# Patient Record
Sex: Female | Born: 1935 | Race: White | Hispanic: No | Smoking: Former smoker
Health system: Southern US, Community
[De-identification: ages and names within clinical notes are randomized; demographics above are authoritative.]

## PROBLEM LIST (undated history)

## (undated) DIAGNOSIS — D649 Anemia, unspecified: Secondary | ICD-10-CM

## (undated) DIAGNOSIS — C801 Malignant (primary) neoplasm, unspecified: Secondary | ICD-10-CM

## (undated) DIAGNOSIS — E785 Hyperlipidemia, unspecified: Secondary | ICD-10-CM

## (undated) DIAGNOSIS — N184 Chronic kidney disease, stage 4 (severe): Secondary | ICD-10-CM

## (undated) DIAGNOSIS — I255 Ischemic cardiomyopathy: Secondary | ICD-10-CM

## (undated) DIAGNOSIS — K1379 Other lesions of oral mucosa: Secondary | ICD-10-CM

## (undated) DIAGNOSIS — F329 Major depressive disorder, single episode, unspecified: Secondary | ICD-10-CM

## (undated) DIAGNOSIS — F32A Depression, unspecified: Secondary | ICD-10-CM

## (undated) DIAGNOSIS — I729 Aneurysm of unspecified site: Secondary | ICD-10-CM

## (undated) DIAGNOSIS — F419 Anxiety disorder, unspecified: Secondary | ICD-10-CM

## (undated) DIAGNOSIS — I5042 Chronic combined systolic (congestive) and diastolic (congestive) heart failure: Secondary | ICD-10-CM

## (undated) DIAGNOSIS — E119 Type 2 diabetes mellitus without complications: Secondary | ICD-10-CM

## (undated) DIAGNOSIS — G629 Polyneuropathy, unspecified: Secondary | ICD-10-CM

## (undated) DIAGNOSIS — K219 Gastro-esophageal reflux disease without esophagitis: Secondary | ICD-10-CM

## (undated) DIAGNOSIS — T82118A Breakdown (mechanical) of other cardiac electronic device, initial encounter: Secondary | ICD-10-CM

## (undated) DIAGNOSIS — M7542 Impingement syndrome of left shoulder: Secondary | ICD-10-CM

## (undated) DIAGNOSIS — I2510 Atherosclerotic heart disease of native coronary artery without angina pectoris: Secondary | ICD-10-CM

## (undated) HISTORY — DX: Hyperlipidemia, unspecified: E78.5

## (undated) HISTORY — PX: EYE SURGERY: SHX253

## (undated) HISTORY — DX: Anxiety disorder, unspecified: F41.9

## (undated) HISTORY — DX: Type 2 diabetes mellitus without complications: E11.9

## (undated) HISTORY — DX: Atherosclerotic heart disease of native coronary artery without angina pectoris: I25.10

## (undated) HISTORY — DX: Ischemic cardiomyopathy: I25.5

## (undated) HISTORY — PX: CHOLECYSTECTOMY: SHX55

## (undated) HISTORY — DX: Impingement syndrome of left shoulder: M75.42

## (undated) HISTORY — DX: Polyneuropathy, unspecified: G62.9

## (undated) HISTORY — PX: ABDOMINAL HYSTERECTOMY: SHX81

## (undated) HISTORY — DX: Gastro-esophageal reflux disease without esophagitis: K21.9

## (undated) HISTORY — DX: Aneurysm of unspecified site: I72.9

---

## 2003-12-12 ENCOUNTER — Ambulatory Visit (HOSPITAL_COMMUNITY): Admission: RE | Admit: 2003-12-12 | Discharge: 2003-12-12 | Payer: Self-pay | Admitting: Internal Medicine

## 2004-06-29 ENCOUNTER — Ambulatory Visit: Payer: Self-pay | Admitting: Cardiology

## 2004-08-05 ENCOUNTER — Ambulatory Visit: Payer: Self-pay | Admitting: Cardiology

## 2004-08-09 ENCOUNTER — Ambulatory Visit: Payer: Self-pay | Admitting: Cardiology

## 2004-08-09 ENCOUNTER — Inpatient Hospital Stay (HOSPITAL_COMMUNITY): Admission: RE | Admit: 2004-08-09 | Discharge: 2004-08-10 | Payer: Self-pay | Admitting: Cardiology

## 2004-08-09 ENCOUNTER — Inpatient Hospital Stay (HOSPITAL_BASED_OUTPATIENT_CLINIC_OR_DEPARTMENT_OTHER): Admission: RE | Admit: 2004-08-09 | Discharge: 2004-08-09 | Payer: Self-pay | Admitting: Cardiology

## 2004-08-12 ENCOUNTER — Ambulatory Visit: Payer: Self-pay | Admitting: Cardiology

## 2004-08-19 ENCOUNTER — Ambulatory Visit: Payer: Self-pay | Admitting: Cardiology

## 2004-08-25 ENCOUNTER — Ambulatory Visit: Payer: Self-pay | Admitting: Cardiology

## 2004-09-02 ENCOUNTER — Ambulatory Visit: Payer: Self-pay | Admitting: Cardiology

## 2004-09-06 ENCOUNTER — Ambulatory Visit: Payer: Self-pay | Admitting: Cardiology

## 2004-09-08 ENCOUNTER — Ambulatory Visit: Payer: Self-pay | Admitting: Cardiology

## 2004-09-10 ENCOUNTER — Ambulatory Visit: Payer: Self-pay | Admitting: Cardiology

## 2004-09-16 ENCOUNTER — Ambulatory Visit: Payer: Self-pay | Admitting: Cardiology

## 2004-09-20 ENCOUNTER — Ambulatory Visit: Payer: Self-pay | Admitting: Cardiology

## 2004-09-24 ENCOUNTER — Ambulatory Visit: Payer: Self-pay | Admitting: Internal Medicine

## 2004-10-01 ENCOUNTER — Ambulatory Visit: Payer: Self-pay | Admitting: Cardiology

## 2004-10-01 ENCOUNTER — Ambulatory Visit: Payer: Self-pay | Admitting: Internal Medicine

## 2004-10-11 ENCOUNTER — Ambulatory Visit: Payer: Self-pay | Admitting: Cardiology

## 2004-10-18 ENCOUNTER — Inpatient Hospital Stay (HOSPITAL_COMMUNITY): Admission: RE | Admit: 2004-10-18 | Discharge: 2004-10-19 | Payer: Self-pay | Admitting: Internal Medicine

## 2004-10-18 ENCOUNTER — Ambulatory Visit: Payer: Self-pay | Admitting: Internal Medicine

## 2004-11-03 ENCOUNTER — Ambulatory Visit: Payer: Self-pay

## 2004-11-04 ENCOUNTER — Ambulatory Visit: Payer: Self-pay | Admitting: Cardiology

## 2004-12-29 ENCOUNTER — Ambulatory Visit: Payer: Self-pay | Admitting: Cardiology

## 2005-01-03 ENCOUNTER — Ambulatory Visit: Payer: Self-pay | Admitting: Internal Medicine

## 2005-01-21 ENCOUNTER — Ambulatory Visit: Payer: Self-pay

## 2005-02-11 ENCOUNTER — Inpatient Hospital Stay (HOSPITAL_COMMUNITY): Admission: EM | Admit: 2005-02-11 | Discharge: 2005-02-17 | Payer: Self-pay | Admitting: Emergency Medicine

## 2005-02-11 ENCOUNTER — Ambulatory Visit: Payer: Self-pay | Admitting: Cardiology

## 2005-02-16 ENCOUNTER — Encounter: Payer: Self-pay | Admitting: Cardiovascular Disease

## 2005-02-21 ENCOUNTER — Ambulatory Visit: Payer: Self-pay | Admitting: Cardiology

## 2005-02-22 ENCOUNTER — Ambulatory Visit: Payer: Self-pay

## 2005-02-24 ENCOUNTER — Ambulatory Visit: Payer: Self-pay | Admitting: Cardiology

## 2005-03-04 ENCOUNTER — Ambulatory Visit: Payer: Self-pay | Admitting: Cardiology

## 2005-03-07 ENCOUNTER — Ambulatory Visit: Payer: Self-pay | Admitting: Cardiology

## 2005-03-10 ENCOUNTER — Ambulatory Visit: Payer: Self-pay | Admitting: Cardiology

## 2005-03-14 ENCOUNTER — Ambulatory Visit: Payer: Self-pay | Admitting: Cardiology

## 2005-03-18 ENCOUNTER — Ambulatory Visit: Payer: Self-pay | Admitting: Cardiology

## 2005-04-01 ENCOUNTER — Ambulatory Visit: Payer: Self-pay | Admitting: Cardiology

## 2005-04-04 ENCOUNTER — Ambulatory Visit: Payer: Self-pay | Admitting: Cardiology

## 2005-04-15 ENCOUNTER — Ambulatory Visit: Payer: Self-pay | Admitting: Cardiology

## 2005-05-17 ENCOUNTER — Ambulatory Visit: Payer: Self-pay | Admitting: Cardiology

## 2005-06-15 ENCOUNTER — Ambulatory Visit: Payer: Self-pay | Admitting: Cardiology

## 2005-06-17 ENCOUNTER — Ambulatory Visit: Payer: Self-pay | Admitting: Cardiology

## 2005-06-21 ENCOUNTER — Ambulatory Visit: Payer: Self-pay | Admitting: Cardiology

## 2005-07-07 ENCOUNTER — Ambulatory Visit: Payer: Self-pay | Admitting: Cardiology

## 2005-07-21 ENCOUNTER — Ambulatory Visit: Payer: Self-pay | Admitting: Cardiology

## 2005-08-22 ENCOUNTER — Ambulatory Visit: Payer: Self-pay | Admitting: Cardiology

## 2005-09-22 ENCOUNTER — Ambulatory Visit: Payer: Self-pay | Admitting: Cardiology

## 2005-11-04 ENCOUNTER — Ambulatory Visit: Payer: Self-pay | Admitting: Cardiology

## 2005-11-11 ENCOUNTER — Ambulatory Visit: Payer: Self-pay | Admitting: Cardiology

## 2006-02-03 ENCOUNTER — Ambulatory Visit: Payer: Self-pay | Admitting: Cardiology

## 2006-05-12 ENCOUNTER — Ambulatory Visit: Payer: Self-pay | Admitting: Cardiology

## 2006-06-12 ENCOUNTER — Ambulatory Visit: Payer: Self-pay | Admitting: Cardiology

## 2006-07-13 ENCOUNTER — Ambulatory Visit: Payer: Self-pay | Admitting: Cardiology

## 2006-07-25 ENCOUNTER — Ambulatory Visit: Payer: Self-pay | Admitting: Cardiology

## 2006-10-10 ENCOUNTER — Ambulatory Visit: Payer: Self-pay | Admitting: Cardiology

## 2006-11-17 ENCOUNTER — Ambulatory Visit: Payer: Self-pay | Admitting: Cardiology

## 2006-12-11 ENCOUNTER — Ambulatory Visit: Payer: Self-pay | Admitting: Cardiology

## 2006-12-12 ENCOUNTER — Ambulatory Visit: Payer: Self-pay | Admitting: Cardiology

## 2006-12-12 ENCOUNTER — Inpatient Hospital Stay (HOSPITAL_COMMUNITY): Admission: AD | Admit: 2006-12-12 | Discharge: 2006-12-14 | Payer: Self-pay | Admitting: Cardiovascular Disease

## 2006-12-21 ENCOUNTER — Ambulatory Visit (HOSPITAL_COMMUNITY): Admission: RE | Admit: 2006-12-21 | Discharge: 2006-12-21 | Payer: Self-pay | Admitting: Internal Medicine

## 2006-12-21 ENCOUNTER — Ambulatory Visit: Payer: Self-pay | Admitting: Internal Medicine

## 2006-12-28 ENCOUNTER — Ambulatory Visit: Payer: Self-pay | Admitting: Cardiology

## 2007-01-11 ENCOUNTER — Ambulatory Visit: Payer: Self-pay | Admitting: Cardiology

## 2007-02-19 ENCOUNTER — Ambulatory Visit: Payer: Self-pay

## 2007-03-01 ENCOUNTER — Ambulatory Visit: Payer: Self-pay | Admitting: Cardiology

## 2007-03-13 ENCOUNTER — Ambulatory Visit: Payer: Self-pay | Admitting: Internal Medicine

## 2007-03-16 ENCOUNTER — Ambulatory Visit: Payer: Self-pay | Admitting: Cardiology

## 2007-04-09 ENCOUNTER — Ambulatory Visit: Payer: Self-pay | Admitting: Cardiology

## 2007-06-15 ENCOUNTER — Ambulatory Visit: Payer: Self-pay | Admitting: Internal Medicine

## 2007-06-26 ENCOUNTER — Ambulatory Visit: Payer: Self-pay | Admitting: Cardiology

## 2007-08-06 ENCOUNTER — Encounter: Payer: Self-pay | Admitting: Cardiology

## 2007-09-11 ENCOUNTER — Ambulatory Visit: Payer: Self-pay | Admitting: Internal Medicine

## 2007-10-25 ENCOUNTER — Ambulatory Visit: Payer: Self-pay | Admitting: Cardiology

## 2007-10-25 ENCOUNTER — Encounter: Payer: Self-pay | Admitting: Physician Assistant

## 2007-10-31 ENCOUNTER — Encounter: Payer: Self-pay | Admitting: Physician Assistant

## 2007-11-05 ENCOUNTER — Ambulatory Visit: Payer: Self-pay | Admitting: Cardiology

## 2007-12-07 ENCOUNTER — Encounter: Payer: Self-pay | Admitting: Cardiology

## 2007-12-14 ENCOUNTER — Ambulatory Visit: Payer: Self-pay | Admitting: Internal Medicine

## 2008-03-06 ENCOUNTER — Encounter: Payer: Self-pay | Admitting: Cardiology

## 2008-03-28 ENCOUNTER — Ambulatory Visit: Payer: Self-pay | Admitting: Internal Medicine

## 2008-06-06 ENCOUNTER — Encounter: Payer: Self-pay | Admitting: Cardiology

## 2008-06-06 ENCOUNTER — Ambulatory Visit: Payer: Self-pay | Admitting: Internal Medicine

## 2008-07-04 ENCOUNTER — Ambulatory Visit: Payer: Self-pay | Admitting: Cardiology

## 2008-07-14 ENCOUNTER — Ambulatory Visit: Payer: Self-pay | Admitting: Cardiology

## 2008-07-31 ENCOUNTER — Encounter: Payer: Self-pay | Admitting: Internal Medicine

## 2008-09-02 ENCOUNTER — Ambulatory Visit: Payer: Self-pay | Admitting: Internal Medicine

## 2008-11-20 ENCOUNTER — Ambulatory Visit: Payer: Self-pay | Admitting: Orthopedic Surgery

## 2008-11-20 DIAGNOSIS — M25519 Pain in unspecified shoulder: Secondary | ICD-10-CM | POA: Insufficient documentation

## 2008-11-20 DIAGNOSIS — M7580 Other shoulder lesions, unspecified shoulder: Secondary | ICD-10-CM

## 2008-11-25 ENCOUNTER — Encounter: Payer: Self-pay | Admitting: Orthopedic Surgery

## 2008-11-26 ENCOUNTER — Encounter: Payer: Self-pay | Admitting: Orthopedic Surgery

## 2008-11-28 ENCOUNTER — Encounter: Payer: Self-pay | Admitting: Orthopedic Surgery

## 2008-12-01 ENCOUNTER — Encounter: Payer: Self-pay | Admitting: Internal Medicine

## 2008-12-02 ENCOUNTER — Ambulatory Visit: Payer: Self-pay | Admitting: Internal Medicine

## 2008-12-10 ENCOUNTER — Encounter: Payer: Self-pay | Admitting: Internal Medicine

## 2008-12-11 ENCOUNTER — Ambulatory Visit: Payer: Self-pay | Admitting: Cardiology

## 2009-01-05 ENCOUNTER — Encounter: Payer: Self-pay | Admitting: Orthopedic Surgery

## 2009-01-08 ENCOUNTER — Ambulatory Visit: Payer: Self-pay | Admitting: Orthopedic Surgery

## 2009-02-13 ENCOUNTER — Encounter: Payer: Self-pay | Admitting: Cardiology

## 2009-03-03 ENCOUNTER — Ambulatory Visit: Payer: Self-pay | Admitting: Internal Medicine

## 2009-03-11 ENCOUNTER — Ambulatory Visit: Payer: Self-pay | Admitting: Orthopedic Surgery

## 2009-03-13 ENCOUNTER — Encounter: Payer: Self-pay | Admitting: Cardiology

## 2009-03-19 ENCOUNTER — Encounter: Payer: Self-pay | Admitting: Internal Medicine

## 2009-04-12 DIAGNOSIS — I5022 Chronic systolic (congestive) heart failure: Secondary | ICD-10-CM

## 2009-04-12 DIAGNOSIS — E785 Hyperlipidemia, unspecified: Secondary | ICD-10-CM | POA: Insufficient documentation

## 2009-05-26 ENCOUNTER — Encounter: Payer: Self-pay | Admitting: Cardiology

## 2009-05-26 ENCOUNTER — Ambulatory Visit: Payer: Self-pay | Admitting: Cardiology

## 2009-05-27 ENCOUNTER — Ambulatory Visit: Payer: Self-pay | Admitting: Cardiovascular Disease

## 2009-05-27 ENCOUNTER — Encounter: Payer: Self-pay | Admitting: Cardiology

## 2009-05-27 ENCOUNTER — Inpatient Hospital Stay (HOSPITAL_COMMUNITY): Admission: AD | Admit: 2009-05-27 | Discharge: 2009-06-02 | Payer: Self-pay | Admitting: Cardiology

## 2009-05-28 HISTORY — PX: CARDIAC DEFIBRILLATOR PLACEMENT: SHX171

## 2009-05-29 ENCOUNTER — Encounter: Payer: Self-pay | Admitting: Internal Medicine

## 2009-05-29 ENCOUNTER — Ambulatory Visit: Payer: Self-pay | Admitting: Surgery

## 2009-06-01 ENCOUNTER — Encounter: Payer: Self-pay | Admitting: Internal Medicine

## 2009-06-02 ENCOUNTER — Encounter: Payer: Self-pay | Admitting: Cardiology

## 2009-06-05 ENCOUNTER — Ambulatory Visit: Payer: Self-pay | Admitting: Internal Medicine

## 2009-06-05 ENCOUNTER — Encounter: Payer: Self-pay | Admitting: Internal Medicine

## 2009-06-10 ENCOUNTER — Encounter: Payer: Self-pay | Admitting: Cardiology

## 2009-06-29 ENCOUNTER — Ambulatory Visit: Payer: Self-pay | Admitting: Orthopedic Surgery

## 2009-06-29 DIAGNOSIS — M1710 Unilateral primary osteoarthritis, unspecified knee: Secondary | ICD-10-CM

## 2009-07-03 ENCOUNTER — Ambulatory Visit: Payer: Self-pay | Admitting: Cardiology

## 2009-07-03 DIAGNOSIS — I2510 Atherosclerotic heart disease of native coronary artery without angina pectoris: Secondary | ICD-10-CM

## 2009-07-03 DIAGNOSIS — J984 Other disorders of lung: Secondary | ICD-10-CM

## 2009-07-03 DIAGNOSIS — Z95810 Presence of automatic (implantable) cardiac defibrillator: Secondary | ICD-10-CM | POA: Insufficient documentation

## 2009-07-10 ENCOUNTER — Encounter: Payer: Self-pay | Admitting: Cardiology

## 2009-08-03 ENCOUNTER — Telehealth (INDEPENDENT_AMBULATORY_CARE_PROVIDER_SITE_OTHER): Payer: Self-pay | Admitting: *Deleted

## 2009-09-18 ENCOUNTER — Ambulatory Visit: Payer: Self-pay | Admitting: Internal Medicine

## 2009-09-30 ENCOUNTER — Telehealth (INDEPENDENT_AMBULATORY_CARE_PROVIDER_SITE_OTHER): Payer: Self-pay | Admitting: *Deleted

## 2009-09-30 ENCOUNTER — Encounter: Payer: Self-pay | Admitting: Cardiology

## 2009-10-05 ENCOUNTER — Ambulatory Visit: Payer: Self-pay | Admitting: Orthopedic Surgery

## 2009-10-08 ENCOUNTER — Ambulatory Visit: Payer: Self-pay | Admitting: Cardiology

## 2009-10-08 DIAGNOSIS — F4321 Adjustment disorder with depressed mood: Secondary | ICD-10-CM

## 2009-10-12 ENCOUNTER — Ambulatory Visit: Payer: Self-pay | Admitting: Cardiology

## 2009-10-13 ENCOUNTER — Encounter (INDEPENDENT_AMBULATORY_CARE_PROVIDER_SITE_OTHER): Payer: Self-pay | Admitting: *Deleted

## 2009-12-15 ENCOUNTER — Encounter: Payer: Self-pay | Admitting: Internal Medicine

## 2009-12-15 ENCOUNTER — Ambulatory Visit: Payer: Self-pay | Admitting: Internal Medicine

## 2010-01-05 ENCOUNTER — Encounter: Payer: Self-pay | Admitting: Cardiology

## 2010-01-06 ENCOUNTER — Encounter: Payer: Self-pay | Admitting: Internal Medicine

## 2010-01-19 ENCOUNTER — Ambulatory Visit: Payer: Self-pay | Admitting: Cardiology

## 2010-01-19 DIAGNOSIS — R1084 Generalized abdominal pain: Secondary | ICD-10-CM

## 2010-01-19 DIAGNOSIS — R0602 Shortness of breath: Secondary | ICD-10-CM

## 2010-01-21 ENCOUNTER — Encounter: Payer: Self-pay | Admitting: Cardiology

## 2010-03-18 ENCOUNTER — Ambulatory Visit: Payer: Self-pay | Admitting: Internal Medicine

## 2010-03-19 ENCOUNTER — Telehealth (INDEPENDENT_AMBULATORY_CARE_PROVIDER_SITE_OTHER): Payer: Self-pay | Admitting: *Deleted

## 2010-03-24 ENCOUNTER — Ambulatory Visit: Payer: Self-pay | Admitting: Cardiology

## 2010-03-26 ENCOUNTER — Telehealth (INDEPENDENT_AMBULATORY_CARE_PROVIDER_SITE_OTHER): Payer: Self-pay | Admitting: *Deleted

## 2010-03-31 ENCOUNTER — Telehealth (INDEPENDENT_AMBULATORY_CARE_PROVIDER_SITE_OTHER): Payer: Self-pay | Admitting: *Deleted

## 2010-03-31 ENCOUNTER — Encounter: Payer: Self-pay | Admitting: Cardiology

## 2010-04-07 ENCOUNTER — Encounter: Payer: Self-pay | Admitting: Internal Medicine

## 2010-04-13 ENCOUNTER — Telehealth (INDEPENDENT_AMBULATORY_CARE_PROVIDER_SITE_OTHER): Payer: Self-pay | Admitting: *Deleted

## 2010-04-28 ENCOUNTER — Ambulatory Visit: Payer: Self-pay | Admitting: Cardiology

## 2010-05-04 ENCOUNTER — Ambulatory Visit: Payer: Self-pay | Admitting: Cardiology

## 2010-06-24 ENCOUNTER — Ambulatory Visit: Payer: Self-pay | Admitting: Internal Medicine

## 2010-06-24 ENCOUNTER — Encounter: Payer: Self-pay | Admitting: Internal Medicine

## 2010-07-07 ENCOUNTER — Encounter (INDEPENDENT_AMBULATORY_CARE_PROVIDER_SITE_OTHER): Payer: Self-pay | Admitting: *Deleted

## 2010-07-18 ENCOUNTER — Encounter: Payer: Self-pay | Admitting: Internal Medicine

## 2010-07-23 ENCOUNTER — Encounter: Payer: Self-pay | Admitting: Cardiology

## 2010-07-27 NOTE — Assessment & Plan Note (Signed)
Summary: EPH-POST CONE  Medications Added KLOR-CON M20 20 MEQ CR-TABS (POTASSIUM CHLORIDE CRYS CR) take one by mouth in the morning and 1/2 in evening BAYER ASPIRIN 325 MG TABS (ASPIRIN) Take 1 tablet by mouth once a day VITAMIN B-12 100 MCG TABS (CYANOCOBALAMIN) Take 1 tablet by mouth once a day ALPRAZOLAM 0.5 MG TABS (ALPRAZOLAM) Take 1 tablet by mouth once a day at  bedtime LISINOPRIL 2.5 MG TABS (LISINOPRIL) Take 1 tablet by mouth once a day PRAVACHOL 40 MG TABS (PRAVASTATIN SODIUM) Take 1 tablet by mouth once a day PRAVACHOL 40 MG TABS (PRAVASTATIN SODIUM) take 1/2 tab at bedtime LANOXIN 0.125 MG TABS (DIGOXIN) Take 1 tablet by mouth once a day        Referring Provider:  same Primary Provider:  [name], M D  CC:  hospital follow-up visit.  History of Present Illness: the patient is a 77 year old female with a history of skin cardiomyopathy. The patient has a history of LV dysfunction and is status post epicardial lead CRT-D implantation several years ago by Dr. Amador Cunas. The patient was recent hospitalized with incessant cardio defibrillator shocks. This was secondary to lead fracture. The patient underwent revision of her defibrillator and LV lead. However efforts were aborted to place a new LV lead. During the hospitalization the patient underwent bare-metal stenting to the LAD and right coronary artery. High-grade lesions. The patient's status remained short of breath. She denies orthopnea, PND, palpitations or syncope.the patient also has been followed for a lung nodule approximate 7-8 mm in the right lower lobe. Should be seen by Dr. Koleen Nimrod. pulmonaryfunction tests have been orderedand he will follow the lesion closely in the near future.  Preventive Screening-Counseling & Management  Alcohol-Tobacco     Smoking Status: quit     Year Quit: 2005  Current Problems (verified): 1)  Pulmonary Nodule  (ICD-518.89) 2)  Coronary Artery Disease, S/p Ptca  (ICD-414.9) 3)   Implantation of Defibrillator, Hx of  (ICD-V45.02) 4)  Left Ventricular Function, Decreased  (ICD-429.2) 5)  Knee, Arthritis, Degen.Jefm Miles  (ICD-715.96) 6)  Knee Pain  (ICD-719.46) 7)  Hyperlipidemia-mixed  (ICD-272.4) 8)  Cardiomyopathy, Ischemic  (A999333) 9)  Systolic Heart Failure, Chronic  (ICD-428.22) 10)  Shoulder Pain  (ICD-719.41) 11)  Impingement Syndrome  (ICD-726.2) 12)  Shoulder Impingement Syndrome, Left  (ICD-726.2)  Current Medications (verified): 1)  Lantus .Marland Kitchen.. 60 U Am and Pm 2)  Carvedilol 6.25 Mg Tabs (Carvedilol) .... Take 1 Tablet By Mouth Two Times A Day 3)  Lasix 80 Mg Tabs (Furosemide) .... Take 1 Tablet By Mouth Two Times A Day 4)  Klor-Con M20 20 Meq Cr-Tabs (Potassium Chloride Crys Cr) .... Take One By Mouth in The Morning and 1/2 in Evening 5)  Omeprazole 20 .Marland KitchenMarland Kitchen. 1 Daily 6)  Bayer Aspirin 325 Mg Tabs (Aspirin) .... Take 1 Tablet By Mouth Once A Day 7)  Vitamin B-12 100 Mcg Tabs (Cyanocobalamin) .... Take 1 Tablet By Mouth Once A Day 8)  Alprazolam 0.5 Mg Tabs (Alprazolam) .... Take 1 Tablet By Mouth Once A Day At  Bedtime 9)  Tramadol Hcl 50 Mg Tabs (Tramadol Hcl) .... One By Mouth Q 4 Hrs As Needed Pain 10)  Lisinopril 2.5 Mg Tabs (Lisinopril) .... Take 1 Tablet By Mouth Once A Day 11)  Pravachol 40 Mg Tabs (Pravastatin Sodium) .... Take 1/2 Tab At Bedtime 12)  Lanoxin 0.125 Mg Tabs (Digoxin) .... Take 1 Tablet By Mouth Once A Day  Allergies: 1)  ! Sulfa  2)  ! Metformin Hcl  Comments:  Nurse/Medical Assistant: The patient's medications and allergies were reviewed with the patient and were updated in the Medication and Allergy Lists. List brought.  Past History:  Social History: Last updated: 04/12/2009 Retired  Married  Tobacco Use - Former.  Alcohol Use - no  Risk Factors: Smoking Status: quit (07/03/2009)  Past Medical History: diabetes heart attack 2006 anxiety reflux HYPERLIPIDEMIA-MIXED (ICD-272.4) CARDIOMYOPATHY, ISCHEMIC  (A999333) SYSTOLIC HEART FAILURE, CHRONIC (ICD-428.22) SHOULDER PAIN (ICD-719.41) IMPINGEMENT SYNDROME (ICD-726.2) SHOULDER IMPINGEMENT SYNDROME, LEFT (ICD-726.2) History of apical aneurysm Chronic renal sufficiency 1. Chronic systolic heart failure.     a.     Ejection fraction 45% by echo in June 2008. 2. Ischemic cardiomyopathy.     a.     Moderate nonobstructive coronary artery disease by      catheterization in June 2008.     b.     Status post stenting of the RCA in 2006.     c.     Status post Medtronic biventricular implantable cardioverter-      defibrillator implantation. 3. 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 4. Insulin-dependent diabetes mellitus. 5. Dyslipidemia, STATIN intolerant. 6. Chronic renal sufficiency. 7. History of tobacco use discontinued. 8. Peripheral polyneuropathy refractory to several medications.  Past Surgical History: Reviewed history from 11/20/2008 and no changes required. hysterectomy rt rcr defibrillater pacemaker stent  Family History: Reviewed history and no changes required. noncontributory  Review of Systems       The patient complains of fatigue, shortness of breath, and muscle weakness.  The patient denies malaise, fever, weight gain/loss, vision loss, decreased hearing, hoarseness, chest pain, palpitations, prolonged cough, wheezing, sleep apnea, coughing up blood, abdominal pain, blood in stool, nausea, vomiting, diarrhea, heartburn, incontinence, blood in urine, joint pain, leg swelling, rash, skin lesions, headache, fainting, dizziness, depression, anxiety, enlarged lymph nodes, easy bruising or bleeding, and environmental allergies.    Vital Signs:  Patient profile:   77 year old female Height:      66 inches Weight:      171 pounds BMI:     27.70 Pulse rate:   69 / minute BP sitting:   110 / 70  (left arm)  Vitals Entered By: Georgina Peer (July 03, 2009 10:29 AM)  Nutrition Counseling: Patient's  BMI is greater than 25 and therefore counseled on weight management options. CC: hospital follow-up visit   Physical Exam  General:  Well developed, well nourished, in no acute distress. Head:  normocephalic and atraumatic Eyes:  PERRLA/EOM intact; conjunctiva and lids normal. Nose:  no deformity, discharge, inflammation, or lesions Mouth:  Teeth, gums and palate normal. Oral mucosa normal. Neck:  Neck supple, no JVD. No masses, thyromegaly or abnormal cervical nodes. Lungs:  Clear bilaterally to auscultation and percussion. Heart:  Non-displaced PMI, chest non-tender; regular rate and rhythm, S1, S2 without murmurs, rubs or gallops. Carotid upstroke normal, no bruit. Normal abdominal aortic size, no bruits. Femorals normal pulses, no bruits. Pedals normal pulses. No edema, no varicosities. Abdomen:  Bowel sounds positive; abdomen soft and non-tender without masses, organomegaly, or hernias noted. No hepatosplenomegaly. Msk:  Back normal, normal gait. Muscle strength and tone normal. Pulses:  pulses normal in all 4 extremities Extremities:  No clubbing or cyanosis. Neurologic:  Alert and oriented x 3. Skin:  Intact without lesions or rashes. Cervical Nodes:  no significant adenopathy Psych:  Normal affect.    ICD Specifications Following MD:  [place] Grayer, MD  ICD Vendor:  Medtronic     ICD Model Number:  [id number]     ICD Serial Number:  [id number] ICD DOI:  05/28/2009     ICD Implanting MD:  Virl Axe, MD  Lead 1:    Location: RA     DOI: 10/18/2004     Model #: [id number]     Serial #: [id number]     Status: active Lead 2:    Location: RV     DOI: 10/18/2004     Model #: [id number]     Serial #: [id number]     Status: active Lead 3:    Location: LV     DOI: 10/18/2004     Model #: [id number]     Serial #: [id number]     Status: active Lead 4:    Location: RV     DOI: 05/28/2009     Model #: [id number]     Serial #: [id number]     Status: active  Indications::  ICM, CHF  Explantation Comments:  05/28/2009 Medtronic Sentry [id number] H explanted.  ICD Follow Up ICD Dependent:  No       ICD Device Measurements Configuration: LV TIP TO RV COIL  Brady Parameters Mode DDDR     Lower Rate Limit:  50     Upper Rate Limit 130 PAV 140     Sensed AV Delay:  110  Tachy Zones VF:  200     VT:  176     Impression & Recommendations:  Problem # 1:  CORONARY ARTERY DISEASE, S/P PTCA (ICD-414.9) patient is status post recent percutaneous coronary intervention.the patient underwent bare-metal stenting is currently off Plavix.  She has no recurrent chest pain.  She does have LV dysfunction and is both on a beta-blocker and an ACE inhibitor. Her updated medication list for this problem includes:    Carvedilol 6.25 Mg Tabs (Carvedilol) .Marland Kitchen... Take 1 tablet by mouth two times a day    Bayer Aspirin 325 Mg Tabs (Aspirin) .Marland Kitchen... Take 1 tablet by mouth once a day    Lisinopril 2.5 Mg Tabs (Lisinopril) .Marland Kitchen... Take 1 tablet by mouth once a day  Problem # 2:  IMPLANTATION OF DEFIBRILLATOR, HX OF (ICD-V45.02) the patient status-post. CRT-D revision.  She reports no complications.  Her pocket site appears to be well-healed.  She has had no recurrent discharges.  Problem # 3:  PULMONARY NODULE (ICD-518.89) the patient will need follow-up for a pulmonary nodule.  Anticipate that she will need a CT scan in 6 months.  Problem # 4:  LEFT VENTRICULAR FUNCTION, DECREASED (ICD-429.2) the patient's significant LV dysfunction.  Hopefully her LV function will improve after her recent coronary intervention and an echocardiogram has been ordered for 3 months from now.  We also added Lanoxin .125 mg a day in the setting of her systolic heart failure.  Follow-up labs will be done in one week.  Other Orders: T-Basic Metabolic Panel (99991111) T-BNP  (B Natriuretic Peptide) [phone]) T-Digoxin [phone])  Patient Instructions: 1)  Lanoxin 0.125mg  daily 2)  Labs - do in one week. 3)  Echo in 3 months.  (prior to next visit) 4)  Follow up in  3 months.   Prescriptions: LANOXIN 0.125 MG TABS (DIGOXIN) Take 1 tablet by mouth once a day  #30 x 6   Entered by:   Lovina Reach, LPN   Authorized by:   Terald Sleeper, MD, [facility]   Signed by:   Lovina Reach, LPN on [id number]  Method used:   Electronically to        [facility]* (retail)       [address]       [place], [place]  [address]       Ph: [phone]       Fax: [phone]   RxID:   [id number]

## 2010-07-27 NOTE — Progress Notes (Signed)
Summary: fluid   Phone Note Call from Patient   Summary of Call: Patient walked in questioning whether or not she should get her optivol checked again.  Not sure if her fluid is gone.  Was just over to see Dr. Glendell Docker and is having alot of trouble with bladder infections.  He is going to try a different med that she takes nightly.  States she just doesn't feel good.  Advised her that she has OV with Dr. [name] for 11/2.  Advised her that we could do BMET, BNP to recheck potassium and overall fluid level.  She stated that she thought she would just wait till OV and see how she is doing then.  Will call office if symptoms worsen.    Initial call taken by: Lovina Reach, LPN,  October 18, 624THL 3:03 PM  Follow-up for Phone Call        OK Follow-up by: Terald Sleeper, MD, [facility],  April 16, 2010 6:16 AM

## 2010-07-27 NOTE — Progress Notes (Signed)
Summary: PHONE: MEDICATION ISSUES  Medications Added PRAVACHOL 40 MG TABS (PRAVASTATIN SODIUM) take 1/2 tab at bedtime (HOLD)       Phone Note Call from Patient Call back at Home Phone [phone]   Caller: Patient Details for Reason: Medication issues Summary of Call: Mrs. Inskeep called and states that one of her medications is not agreeing with her. She is having nausea, weakness , cramping in abdomen. She states that taking cholesterol medications have never agreeded with her.  Initial call taken by: Delfino Lovett,  August 03, 2009 10:43 AM  Follow-up for Phone Call        Pt states she's been on lisinopril and Pravastatin since first of December following stent placement and ICD "messing up." She states pravastatin was decreased to 40mg  by Dr. Lutricia Feil b/c she was feeling nausea, head feeling funny, stomach cramping.  She states she was also started on digoxin by Dr. Lutricia Feil. He wanted her to take it 7 days and then do blood work. She never received results of labs. (These were not received from [facility]. Pulled today and will have MD review). Pt continues to have nausea, weakness in legs, lightheadedness, and stomach cramping. She doesn' t know which of these medicines is causing these symptoms. She states she has these symptoms almost everyday. Pt states worse today and she may leave off Pravastatin to see if this helps. Follow-up by: Gurney Maxin, RN, BSN,  August 03, 2009 4:43 PM  Additional Follow-up for Phone Call Additional follow up Details #1::        stop both digoxin and pravachol. Evaluate in one week. If symptoms resolved reintroduce low dose Pravachol at 20mg  by mouth at bedtime.  Terald Sleeper, MD, [facility]  August 04, 2009 6:34 AM     Additional Follow-up for Phone Call Additional follow up Details #2::    Pt notified. Pt verbalized understanding.  Follow-up by: Gurney Maxin, RN, BSN,  August 04, 2009 8:52 AM  New/Updated Medications: PRAVACHOL 40 MG TABS  (PRAVASTATIN SODIUM) take 1/2 tab at bedtime (HOLD)

## 2010-07-27 NOTE — Assessment & Plan Note (Signed)
Summary: 3 MO FU S-RS  Medications Added LANTUS 100 UNIT/ML SOLN (INSULIN GLARGINE) use as directed VITAMIN B-12 1000 MCG TABS (CYANOCOBALAMIN) Take 1 tablet by mouth once a day TRAMADOL HCL 50 MG TABS (TRAMADOL HCL) one-two by mouth q 4-6 hrs as needed pain      Allergies Added:   Visit Type:  Pacemaker check Referring Provider:  same Primary Provider:  [name], M D  CC:  pacer check.  History of Present Illness: The patient presents today for routine electrophysiology followup.  She is without complaint today.  The patient denies symptoms of palpitations, chest pain, shortness of breath, orthopnea, PND, lower extremity edema, dizziness, presyncope, syncope, or neurologic sequela. The patient is tolerating medications without difficulties and is otherwise without complaint today.   Preventive Screening-Counseling & Management  Alcohol-Tobacco     Smoking Status: quit     Year Quit: 2005  Current Medications (verified): 1)  Lantus 100 Unit/ml Soln (Insulin Glargine) .... Use As Directed 2)  Carvedilol 6.25 Mg Tabs (Carvedilol) .... Take 1 Tablet By Mouth Two Times A Day 3)  Lasix 80 Mg Tabs (Furosemide) .... Take 1 Tablet By Mouth Two Times A Day 4)  Klor-Con M20 20 Meq Cr-Tabs (Potassium Chloride Crys Cr) .... Take One By Mouth in The Morning and 1/2 in Evening 5)  Omeprazole 20 .Marland KitchenMarland Kitchen. 1 Daily 6)  Bayer Aspirin 325 Mg Tabs (Aspirin) .... Take 1 Tablet By Mouth Once A Day 7)  Vitamin B-12 1000 Mcg Tabs (Cyanocobalamin) .... Take 1 Tablet By Mouth Once A Day 8)  Alprazolam 0.5 Mg Tabs (Alprazolam) .... Take 1 Tablet By Mouth Once A Day At  Bedtime 9)  Tramadol Hcl 50 Mg Tabs (Tramadol Hcl) .... One-Two By Mouth Q 4-6 Hrs As Needed Pain 10)  Lisinopril 2.5 Mg Tabs (Lisinopril) .... Take 1 Tablet By Mouth Once A Day 11)  Pravachol 40 Mg Tabs (Pravastatin Sodium) .... Take 1/2 Tab At Bedtime (Hold) 12)  Lanoxin 0.125 Mg Tabs (Digoxin) .... Take 1 Tablet By Mouth Once A  Day  Allergies (verified): 1)  ! Sulfa 2)  ! Metformin Hcl  Comments:  Nurse/Medical Assistant: The patient's medications and allergies were reviewed with the patient and were updated in the Medication and Allergy Lists. List reviewed.  Past History:  Past Medical History: diabetes heart attack 2006 anxiety reflux HYPERLIPIDEMIA-MIXED (ICD-272.4) CARDIOMYOPATHY, ISCHEMIC (A999333) SYSTOLIC HEART FAILURE, CHRONIC (ICD-428.22) SHOULDER PAIN (ICD-719.41) IMPINGEMENT SYNDROME (ICD-726.2) SHOULDER IMPINGEMENT SYNDROME, LEFT (ICD-726.2) History of apical aneurysm Chronic renal sufficiency 1. Chronic systolic heart failure.     a.     Ejection fraction 45% by echo in June 2008. 2. Ischemic cardiomyopathy.     a.     Moderate nonobstructive coronary artery disease by      catheterization in June 2008.     b.     Status post stenting of the RCA in 2006.     c.     Status post Medtronic biventricular implantable cardioverter-defibrillator implantation.     d.     S/P RV lead revision after multiple ICD shocks for sprint fidelis fracture 3. 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 4. Insulin-dependent diabetes mellitus. 5. Dyslipidemia, STATIN intolerant. 6. Chronic renal sufficiency. 7. History of tobacco use discontinued. 8. Peripheral polyneuropathy refractory to several medications.  Past Surgical History: hysterectomy rt rcr defibrillater stent  Social History: Reviewed history from 04/12/2009 and no changes required. Retired  Married  Tobacco Use - Former.  Alcohol Use -  no  Vital Signs:  Patient profile:   77 year old female Height:      66 inches Weight:      170 pounds Pulse rate:   80 / minute BP sitting:   120 / 60  (left arm) Cuff size:   regular  Vitals Entered By: Georgina Peer (September 18, 2009 2:35 PM)  CC: pacer check   Physical Exam  General:  elderly, NAD Head:  normocephalic and atraumatic Eyes:  PERRLA/EOM intact;  conjunctiva and lids normal. Mouth:  Teeth, gums and palate normal. Oral mucosa normal. Neck:  Neck supple, no JVD. No masses, thyromegaly or abnormal cervical nodes. Chest Wall:   ICD pocket is well healed Lungs:  Clear bilaterally to auscultation and percussion. Heart:  RRR Abdomen:  Bowel sounds positive; abdomen soft and non-tender without masses, organomegaly, or hernias noted. No hepatosplenomegaly. Msk:  Back normal, normal gait. Muscle strength and tone normal. Pulses:  pulses normal in all 4 extremities Extremities:  No clubbing or cyanosis. Neurologic:  Alert and oriented x 3.    ICD Specifications Following MD:  [place] Grayer, MD     ICD Vendor:  Medtronic     ICD Model Number:  [id number]     ICD Serial Number:  [id number] ICD DOI:  05/28/2009     ICD Implanting MD:  Virl Axe, MD  Lead 1:    Location: RA     DOI: 10/18/2004     Model #: [id number]     Serial #: [id number]     Status: active Lead 2:    Location: RV     DOI: 10/18/2004     Model #: [id number]     Serial #: [id number]     Status: active Lead 3:    Location: LV     DOI: 10/18/2004     Model #: [id number]     Serial #: [id number]     Status: active Lead 4:    Location: RV     DOI: 05/28/2009     Model #: [id number]     Serial #: [id number]     Status: active  Indications::  ICM, CHF  Explantation Comments: 05/28/2009 Medtronic Sentry [id number] H explanted.  ICD Follow Up Remote Check?  No Battery Voltage:  3.17 V     Charge Time:  8.9 seconds     Underlying rhythm:  SR ICD Dependent:  No       ICD Device Measurements Atrium:  Amplitude: 0.6 mV, Impedance: 551 ohms, Threshold: 0.5 V at 0.6 msec Right Ventricle:  Amplitude: 20 mV, Impedance: 893 ohms, Threshold: 1.25 V at 0.4 msec Left Ventricle:  Impedance: 494 ohms, Threshold: 2.0 V at 0.6 msec Configuration: LV TIP TO RV COIL Shock Impedance: 55/700 ohms   Episodes MS Episodes:  0     Percent Mode Switch:  0     Coumadin:  No Shock:  0     ATP:  0     Nonsustained:  0      Atrial Pacing:  3.2%     Ventricular Pacing:  100%  Brady Parameters Mode DDDR     Lower Rate Limit:  50     Upper Rate Limit 130 PAV 140     Sensed AV Delay:  110  Tachy Zones VF:  200     VT:  176     Next Remote Date:  12/15/2009     Next Cardiology Appt Due:  08/26/2010 Tech Comments:  Outputs reprogrammed  for chronic thresholds.  Optivol and thoracic impedance noraml.  Carelink transmissions every 3 months. ROV 1 year with Dr. Rayann Heman in [place]. Alma Friendly, LPN  March 25, 624THL 075-GRM PM  MD Comments:  normal ICD function  Impression & Recommendations:  Problem # 1:  SYSTOLIC HEART FAILURE, CHRONIC (ICD-428.22) stable without acute decompensation normal ICD function no changes today  Problem # 2:  CORONARY ARTERY DISEASE, S/P PTCA (ICD-414.9) stable  Patient Instructions: 1)  carelink every 3 months 2)  return in 12 months

## 2010-07-27 NOTE — Progress Notes (Signed)
Summary: ? interacion with potassium and enablex   Phone Note From Pharmacy Call back at [phone]   Caller: [facility] Summary of Call: States Dr. Mick Sell has just sent in rx for Enablex.  Pharm is calling us due to possible interaction with her potassium.  We just increased to two times a day since labs showed she was a little low.  Pharm states that any of these types of meds (Enablex) can potentially make her sodium level go up.  Want to clarify if we really want her to increase dose or not.   Initial call taken by: Lovina Reach, LPN,  October  5, 624THL 2:04 PM  Follow-up for Phone Call        combination is contraindicated for solid potassium dose forms.K-Dur tablets need to be discontinued. Liquid solution would be okay but it might be easier to start her on spironolactone 12.5 mg p.o. q. daily and check in one week another BMET. DC all potassium supplements while taking spironolactone Follow-up by: Terald Sleeper, MD, [facility]  April 01, 2010 12:39 PM  Additional Follow-up for Phone Call Additional follow up Details #1::        Patient stated that she decided NOT to take the Enablex.  She will contine her Potassium as previously discussed.    Additional Follow-up by: Lovina Reach, LPN,  October  7, 624THL 9:16 AM    Prescriptions: KLOR-CON M20 20 MEQ CR-TABS (POTASSIUM CHLORIDE CRYS CR) Take 1 tablet by mouth two times a day  #60 x 6   Entered by:   Lovina Reach, LPN   Authorized by:   Terald Sleeper, MD, [facility]   Signed by:   Lovina Reach, LPN on 624THL   Method used:   Electronically to        [facility]* (retail)       [address]       [place], [place]  [address]       Ph: [phone]       Fax: [phone]   RxID:   [id number]

## 2010-07-27 NOTE — Assessment & Plan Note (Signed)
Summary: left knee pain needs xr/cigna/bsf   Visit Type:  Follow-up, .newprob  Referring Provider:  same Primary Provider:  [name], M D  CC:  left knee pain.  History of Present Illness: I saw [name] in the office today for a followup visit.  She is a 77 years old woman with the complaint of:  chief complaint:  left knee pain.  pain -duration 2 months ago her calf started hurting, he pacemaker malfunctioned, checked for blood clot in her leg which was negative, 2 weeks ago started having pain in the left knee. No back pain.  -location whole knee   -severity [1-10]  7 or 8  -worsened by: walking  -improved by: resting, tramadol helps, takes for neuropathy is a diabetic.  -quality: pain is sharp when it catches.  -other symptoms has catching with walking. No numbness in the leg. No swelling of the knee, her whole leg feels weak, no back pain, has popping of the knee.   -xrays done & where:  today in our office.   No injections or therapy for the knee.    Allergies: 1)  ! Sulfa  Review of Systems       denies any back pain.  Physical Exam  Msk:  Gen. appearance small framed oriented x3 mood and affect normal  Gait supported by crutch  Inspection of the LEFT knee tenderness over the medial compartment no joint effusion range of motion 115 joint stability normal strength normal skin normal pulses normal temperature normal lymph nodes negative sensation normal reflexes normal.  Meniscal signs negative.   Impression & Recommendations:  Problem # 1:  KNEE, ARTHRITIS, DEGEN./OSTEO (ICD-715.96) Assessment New  x-rays AP lateral and patellofemoral view LEFT knee  Mild joint space narrowing medial compartment otherwise normal  Impression mild medial arthritis of the LEFT knee  Recommend injection.  LEFT knee injection Verbal consent was obtained. The knee was prepped with alcohol and ethyl chloride. 1 cc of depomedrol 40mg /cc and 4 cc of  lidocaine 1% was injected. there were no complications.  Her updated medication list for this problem includes:    Tramadol Hcl 50 Mg Tabs (Tramadol hcl) ..... One by mouth q 4 hrs as needed pain  Orders: Est. Patient Level IV [id number]) Joint Aspirate / Injection, Large (20610) Depo- Medrol 40mg  (J1030) Knee x-ray,  3 views [id number])  Problem # 2:  KNEE PAIN [id number]) Assessment: New  Her updated medication list for this problem includes:    Tramadol Hcl 50 Mg Tabs (Tramadol hcl) ..... One by mouth q 4 hrs as needed pain  Orders: Est. Patient Level IV [id number])  Medications Added to Medication List This Visit: 1)  Tramadol Hcl 50 Mg Tabs (Tramadol hcl) .... One by mouth q 4 hrs as needed pain 2)  Lisinopril 2.5 Mg Tabs (Lisinopril) 3)  Pravachol 40 Mg Tabs (Pravastatin sodium)  Patient Instructions: 1)  You have received an injection of cortisone today. You may experience increased pain at the injection site. Apply ice pack to the area for 20 minutes every 2 hours and take 2 xtra strength tylenol every 8 hours. This increased pain will usually resolve in 24 hours. The injection will take effect in 3-10 days.  2)  Please schedule a follow-up appointment as needed.

## 2010-07-27 NOTE — Letter (Signed)
Summary: Risk analyst at [facility]. [address]   [place], [place] [address]   Phone: [phone]  Fax: [phone]        October 13, 2009 MRN: [id number]    FARNAZ DERMODY [place], [place]  [address]    Dear Ms. Barich,  Your test ordered by Rande Lawman has been reviewed by your physician (or physician assistant) and was found to be stable. Your physician (or physician assistant) felt no changes were needed at this time.  _X___ Echocardiogram  ____ Cardiac Stress Test  ____ Lab Work  ____ Peripheral vascular study of arms, legs or neck  ____ CT scan or X-ray  ____ Lung or Breathing test  ____ Other:   Thank you.   Gurney Maxin, RN, BSN    Bryon Lions, M.D., F.A.C.C. Maceo Pro, M.D., F.A.C.C. Cammy Copa, M.D., F.A.C.C. Vonda Antigua, M.D., F.A.C.C. Vita Barley, M.D., F.A.C.C. Mare Ferrari, M.D., F.A.C.C. Emi Belfast, PA-C

## 2010-07-27 NOTE — Cardiovascular Report (Signed)
Summary: Office Visit Remote   Office Visit Remote   Imported By: Sallee Provencal 04/08/2010 11:10:43  _____________________________________________________________________  External Attachment:    Type:   Image     Comment:   External Document

## 2010-07-27 NOTE — Consult Note (Signed)
Summary: CARDIOLOGY CONSULT/ [facility]SULT/ [facility]   Imported By: Delfino Lovett 07/03/2009 08:54:42  _____________________________________________________________________  External Attachment:    Type:   Image     Comment:   External Document

## 2010-07-27 NOTE — Assessment & Plan Note (Signed)
Summary: OPTIVOL-JM     Nurse Visit  Comments Pt presents for follow up Optivol. She took increased Lasix as instructed. She had lab work done this morning. Pt states she's been weak so that probably means she lost some of the fluid. Gurney Maxin, RN, BSN  March 24, 2010 9:40 AM     Allergies: 1)  ! Sulfa 2)  ! Metformin Hcl  See other note . BNP normal. Ignore optivol findings. Bo change in lasix.  Terald Sleeper, MD, [facility]  March 28, 2010 4:49 PM   Left message to return call.  Lovina Reach, LPN  October  4, 624THL 11:50 AM    patient returned call Cher Nakai  March 30, 2010 12:53 PM  Patient notified.    Lovina Reach, LPN  October  5, 624THL 1:44 PM

## 2010-07-27 NOTE — Assessment & Plan Note (Signed)
Summary: 3 MONTH FU -RECV REMINDER-VS  Medications Added CITALOPRAM HYDROBROMIDE 20 MG TABS (CITALOPRAM HYDROBROMIDE) Take 1 tablet by mouth once a day TRIMETHOPRIM 100 MG TABS (TRIMETHOPRIM) Take 1 tablet by mouth once a day DICYCLOMINE HCL 10 MG CAPS (DICYCLOMINE HCL) Take 1 tablet by mouth once a day LISINOPRIL 5 MG TABS (LISINOPRIL) Take 1 tablet by mouth once a day      Allergies Added:   Visit Type:  Follow-up Referring Provider:  same Primary Provider:  [name], M D   History of Present Illness: the patient is a 77 year old female with ischemic cardio myopathy. Lately she has done rather well with no hospitalizations for heart failure. The patient has a biventricular implantable cardioverter defibrillator. Several months ago her OptiFlow was checked and she required increased dose of Lasix. After has been feeling better and her BNP level is now 77 he didn't checked and OptiFlow today and was entirely within normal limits and stable. The patient denies any chest pain. She didn't like a class IIb. Reports no orthopnea PND she reports no palpitations or syncope. Reviewed her blood work in the office today. Her creatinine is 1.24. Her triglycerides are 266 cholesterol is 173 LDL cholesterol is 92 hemoglobin A1c is 8.3.  Preventive Screening-Counseling & Management  Alcohol-Tobacco     Smoking Status: quit     Year Quit: 2004  Current Medications (verified): 1)  Lantus 100 Unit/ml Soln (Insulin Glargine) .... Use As Directed 2)  Carvedilol 6.25 Mg Tabs (Carvedilol) .... Take 1 Tablet By Mouth Two Times A Day 3)  Lasix 80 Mg Tabs (Furosemide) .... Take 1 Tablet By Mouth Two Times A Day 4)  Klor-Con M20 20 Meq Cr-Tabs (Potassium Chloride Crys Cr) .... Take 1 Tablet By Mouth Two Times A Day 5)  Omeprazole 20 Mg Cpdr (Omeprazole) .... Take 1 Tablet By Mouth Once A Day 6)  Bayer Aspirin 325 Mg Tabs (Aspirin) .... Take 1 Tablet By Mouth Once A Day 7)  Vitamin B-12 1000 Mcg Tabs  (Cyanocobalamin) .... Take 1 Tablet By Mouth Once A Day 8)  Alprazolam 0.5 Mg Tabs (Alprazolam) .... Take 1 Tablet By Mouth Once A Day At  Bedtime 9)  Tramadol Hcl 50 Mg Tabs (Tramadol Hcl) .... One-Two By Mouth Q 4-6 Hrs As Needed Pain 10)  Citalopram Hydrobromide 20 Mg Tabs (Citalopram Hydrobromide) .... Take 1 Tablet By Mouth Once A Day 11)  Trimethoprim 100 Mg Tabs (Trimethoprim) .... Take 1 Tablet By Mouth Once A Day 12)  Dicyclomine Hcl 10 Mg Caps (Dicyclomine Hcl) .... Take 1 Tablet By Mouth Once A Day 13)  Lisinopril 5 Mg Tabs (Lisinopril) .... Take 1 Tablet By Mouth Once A Day  Allergies (verified): 1)  ! Sulfa 2)  ! Metformin Hcl  Comments:  Nurse/Medical Assistant: The patient's medication list and allergies were reviewed with the patient and were updated in the Medication and Allergy Lists.  Past History:  Past Surgical History: Last updated: 09/18/2009 hysterectomy rt rcr defibrillater stent  Social History: Last updated: 04/12/2009 Retired  Married  Tobacco Use - Former.  Alcohol Use - no  Risk Factors: Smoking Status: quit (04/28/2010)  Past Medical History: diabetes heart attack 2006 anxiety reflux HYPERLIPIDEMIA-MIXED (ICD-272.4) CARDIOMYOPATHY, ISCHEMIC (A999333) SYSTOLIC HEART FAILURE, CHRONIC (ICD-428.22) SHOULDER PAIN (ICD-719.41) IMPINGEMENT SYNDROME (ICD-726.2) SHOULDER IMPINGEMENT SYNDROME, LEFT (ICD-726.2) History of apical aneurysm Chronic renal sufficiency 1. Chronic systolic heart failure.     a.     Ejection fraction 45% by echo in June  2008. 2. Ischemic cardiomyopathy.     a.     Moderate nonobstructive coronary artery disease by      catheterization in June 2008.     b.     Status post stenting of the RCA in 2006.     c.     Status post Medtronic biventricular implantable cardioverter-defibrillator implantation.     d.     S/P RV lead revision after multiple ICD shocks for sprint fidelis fracture 3. 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 4. Insulin-dependent diabetes mellitus. 5. Dyslipidemia, STATIN intolerant. 6. Chronic renal sufficiency. 7. History of tobacco use discontinued. 8. Peripheral polyneuropathy refractory to several medications. 2010:. Successful percutaneous intervention of the right coronary artery     using a nondrug-eluting platform. Successful percutaneous intervention of the left anterior     descending artery also using a nondrug-eluting platform. Creatinine 1.17 January 2010 Lipid panel July 2011 triglycerides 266 cholesterol 173 HDL 28 LDL 92 VLDL 53, hemoglobin A1c 6.3  Review of Systems       The patient complains of fatigue, shortness of breath, muscle weakness, and depression.  The patient denies malaise, fever, weight gain/loss, vision loss, decreased hearing, hoarseness, chest pain, palpitations, prolonged cough, wheezing, sleep apnea, coughing up blood, abdominal pain, blood in stool, nausea, vomiting, diarrhea, heartburn, incontinence, blood in urine, joint pain, leg swelling, rash, skin lesions, headache, fainting, dizziness, anxiety, enlarged lymph nodes, easy bruising or bleeding, and environmental allergies.    Vital Signs:  Patient profile:   77 year old female Height:      66 inches Weight:      168 pounds BMI:     27.21 Pulse rate:   69 / minute BP sitting:   115 / 76  (left arm) Cuff size:   large  Vitals Entered By: Georgina Peer (April 28, 2010 11:16 AM)  Nutrition Counseling: Patient's BMI is greater than 25 and therefore counseled on weight management options.  Physical Exam  Additional Exam:  General: Well-developed, well-nourished in no distress head: Normocephalic and atraumatic eyes PERRLA/EOMI intact, conjunctiva and lids normal nose: No deformity or lesions mouth normal dentition, normal posterior pharynx neck: Supple, no JVD.  No masses, thyromegaly or abnormal cervical nodes. No carotid bruits lungs: Normal breath sounds bilaterally  without wheezing.  Normal percussion heart: regular rate and rhythm with normal S1 and S2, no S3 or S4.  PMI is normal.  No pathological murmurs Chest: well-positioned  defibrillator with no erythema or swelling abdomen: Normal bowel sounds, abdomen is soft and nontender without masses, organomegaly or hernias noted.  No hepatosplenomegaly musculoskeletal: Back normal, normal gait muscle strength and tone normal pulsus: Pulse is normal in all 4 extremities Extremities: No peripheral pitting edema neurologic: Alert and oriented x 3 skin: Intact without lesions or rashes cervical nodes: No significant adenopathy psychologic: Normal affect     ICD Specifications Following MD:  Thompson Grayer, MD     ICD Vendor:  Medtronic     ICD Model Number:  [id number]     ICD Serial Number:  [id number] ICD DOI:  05/28/2009     ICD Implanting MD:  Virl Axe, MD  Lead 1:    Location: RA     DOI: 10/18/2004     Model #: [id number]     Serial #: [id number]     Status: active Lead 2:    Location: RV     DOI: 10/18/2004     Model #: [id number]  Serial #: [id number]     Status: active Lead 3:    Location: LV     DOI: 10/18/2004     Model #: [id number]     Serial #: [id number]     Status: active Lead 4:    Location: RV     DOI: 05/28/2009     Model #: [id number]     Serial #: [id number]     Status: active  Indications::  ICM, CHF  Explantation Comments: 05/28/2009 Medtronic Sentry [id number] explanted.  ICD Follow Up ICD Dependent:  No       ICD Device Measurements Configuration: LV TIP TO RV COIL  Episodes Coumadin:  No  Brady Parameters Mode DDDR     Lower Rate Limit:  50     Upper Rate Limit 130 PAV 140     Sensed AV Delay:  110  Tachy Zones VF:  200     VT:  176     Impression & Recommendations:  Problem # 1:  SHORTNESS OF BREATH (ICD-786.05) the patient while in status appears to be stable. However she needs a followup echocardiogram to assess her ejection fraction. I also started her on lisinopril 5 monos 2  q. daily Her updated medication list for this problem includes:    Carvedilol 6.25 Mg Tabs (Carvedilol) .Marland Kitchen... Take 1 tablet by mouth two times a day    Lasix 80 Mg Tabs (Furosemide) .Marland Kitchen... Take 1 tablet by mouth two times a day    Bayer Aspirin 325 Mg Tabs (Aspirin) .Marland Kitchen... Take 1 tablet by mouth once a day    Lisinopril 5 Mg Tabs (Lisinopril) .Marland Kitchen... Take 1 tablet by mouth once a day  Orders: 2-D Echocardiogram (2D Echo)  Problem # 2:  DEPRESSION, SITUATIONAL (ICD-309.0) the patient was urged to continue her antidepressant which he stopped briefly with worsening of her symptoms.  Problem # 3:  CORONARY ARTERY DISEASE, S/P PTCA (ICD-414.9) no definite recurrent chest pain. However the patient has still some shortness of breath at her postpartum status is stable. We will check an echocardiogram to assess LV function. Her updated medication list for this problem includes:    Carvedilol 6.25 Mg Tabs (Carvedilol) .Marland Kitchen... Take 1 tablet by mouth two times a day    Bayer Aspirin 325 Mg Tabs (Aspirin) .Marland Kitchen... Take 1 tablet by mouth once a day    Lisinopril 5 Mg Tabs (Lisinopril) .Marland Kitchen... Take 1 tablet by mouth once a day  Problem # 4:  IMPLANTATION OF DEFIBRILLATOR, HX OF (ICD-V45.02) the patient reports no discharges  Problem # 5:  HYPERLIPIDEMIA-MIXED (ICD-272.4) Assessment: Comment Only  Patient Instructions: 1)  Echo  2)  Lisinopril 5mg  daily 3)  Follow up in  6 months Prescriptions: LISINOPRIL 5 MG TABS (LISINOPRIL) Take 1 tablet by mouth once a day  #30 x 6   Entered by:   Lovina Reach, LPN   Authorized by:   Terald Sleeper, MD, [facility]   Signed by:   Lovina Reach, LPN on 624THL   Method used:   Electronically to        [facility] (retail)       [address], [place]  [address]       Ph: [phone]       Fax: [phone]   RxID:   [id number]   Handout requested.

## 2010-07-27 NOTE — Letter (Signed)
Summary: Remote Device Check  [facility], [place]  [id number] [place]   [place], [place] [address]   Phone: [phone]  Fax: [phone]     April 07, 2010 MRN: [id number]   [name], [place]  [address]   Dear Ms. Kimble,   Your remote transmission was recieved and reviewed by your physician.  All diagnostics were within normal limits for you.  __X___Your next transmission is scheduled for:   06-24-2010.  Please transmit at any time this day.  If you have a wireless device your transmission will be sent automatically.   Sincerely,  Shelly Bombard

## 2010-07-27 NOTE — Miscellaneous (Signed)
Summary: rx - k-dur  Clinical Lists Changes  Medications: Changed medication from KLOR-CON M20 20 MEQ CR-TABS (POTASSIUM CHLORIDE CRYS CR) take one by mouth in the morning and 1/2 in evening to KLOR-CON M20 20 MEQ CR-TABS (POTASSIUM CHLORIDE CRYS CR) Take 1 tablet by mouth two times a day - Signed Rx of KLOR-CON M20 20 MEQ CR-TABS (POTASSIUM CHLORIDE CRYS CR) Take 1 tablet by mouth two times a day;  #60 x 6;  Signed;  Entered by: Lovina Reach, LPN;  Authorized by: Terald Sleeper, MD, [facility];  Method used: Electronically to [facility] [place] [address] [place], [place], [place]  [address], Ph: [phone], Fax: [phone]    Prescriptions: KLOR-CON M20 20 MEQ CR-TABS (POTASSIUM CHLORIDE CRYS CR) Take 1 tablet by mouth two times a day  #60 x 6   Entered by:   Lovina Reach, LPN   Authorized by:   Terald Sleeper, MD, [facility]   Signed by:   Lovina Reach, LPN on [id number]   Method used:   Electronically to        [facility] (retail)       [address]       [place], [place]  [address]       Ph: [phone]       Fax: [phone]   RxID:   [id number]

## 2010-07-27 NOTE — Progress Notes (Signed)
Summary: question about new machine   Phone Note Call from Patient Call back at Home Phone [phone]   Caller: Patient Reason for Call: Talk to Nurse Summary of Call: pt has question about new machine she have. - medtronic.  Initial call taken by: Neil Crouch,  September 30, 2009 2:32 PM  Follow-up for Phone Call        Instructed patient on how to set up transmitter. Follow-up by: Alma Friendly, LPN,  April  7, 624THL 2:02 PM

## 2010-07-27 NOTE — Progress Notes (Signed)
Summary: LAB RESULTS   Phone Note Call from Patient Call back at Home Phone [phone]   Reason for Call: Lab or Test Results Summary of Call: [name] called 10/29 & 10/30 wanting her lab results, she said they have her four fluid pills. Initial call taken by: Bartholomew Boards,  March 26, 2010 4:06 PM  Follow-up for Phone Call        patient informed that labs not reviewed yet and once MD looks at them we would call her back. Follow-up by: Georgina Peer,  March 26, 2010 4:13 PM

## 2010-07-27 NOTE — Assessment & Plan Note (Signed)
Summary: left knee hurting again wants injection xr here jan '11/medic...   Visit Type:  Follow-up Referring Provider:  same Primary Provider:  [name], M D  CC:  left knee pain.  History of Present Illness: I saw [name] in the office today for a followup visit.  She is a 77 years old woman with the complaint of:  left knee pain, OA.  requests injection LEFT knee  LEFT knee joint Verbal consent was obtained. The knee was prepped with alcohol and ethyl chloride. 1 cc of depomedrol 40mg /cc and 4 cc of lidocaine 1% was injected. there were no complications.   Tramadol for pain helps for her Neuropathy and somewhat for the knees.       Allergies: 1)  ! Sulfa 2)  ! Metformin Hcl   Impression & Recommendations:  Problem # 1:  KNEE PAIN (ICD-719.46) Assessment Deteriorated  Her updated medication list for this problem includes:    Bayer Aspirin 325 Mg Tabs (Aspirin) .Marland Kitchen... Take 1 tablet by mouth once a day    Tramadol Hcl 50 Mg Tabs (Tramadol hcl) ..... One-two by mouth q 4-6 hrs as needed pain  Orders: Depo- Medrol 40mg  (J1030) Joint Aspirate / Injection, Large (20610)  Problem # 2:  KNEE, ARTHRITIS, DEGEN./OSTEO (ICD-715.96) Assessment: Deteriorated  Her updated medication list for this problem includes:    Bayer Aspirin 325 Mg Tabs (Aspirin) .Marland Kitchen... Take 1 tablet by mouth once a day    Tramadol Hcl 50 Mg Tabs (Tramadol hcl) ..... One-two by mouth q 4-6 hrs as needed pain   Verbal consent was obtained. The knee was prepped with alcohol and ethyl chloride. 1 cc of depomedrol 40mg /cc and 4 cc of lidocaine 1% was injected. there were no complications.  Orders: Depo- Medrol 40mg  (J1030) Joint Aspirate / Injection, Large (20610)  Patient Instructions: 1)  You have received an injection of cortisone today. You may experience increased pain at the injection site. Apply ice pack to the area for 20 minutes every 2 hours and take 2 xtra strength tylenol every  8 hours. This increased pain will usually resolve in 24 hours. The injection will take effect in 3-10 days.   2)  Please schedule a follow-up appointment as needed.

## 2010-07-27 NOTE — Cardiovascular Report (Signed)
Summary: [facility]/ [facility]  [facility]/ [facility]   Imported By: Bartholomew Boards 09/21/2009 12:12:19  _____________________________________________________________________  External Attachment:    Type:   Image     Comment:   External Document

## 2010-07-27 NOTE — Letter (Signed)
Summary: [facility] D/C SUMMARY DR. ANWAR  [facility] D/C SUMMARY DR. ANWAR   Imported By: Delfino Lovett 07/03/2009 08:57:29  _____________________________________________________________________  External Attachment:    Type:   Image     Comment:   External Document

## 2010-07-27 NOTE — Cardiovascular Report (Signed)
Summary: Office Visit Remote   Office Visit Remote   Imported By: Sallee Provencal 01/07/2010 16:41:36  _____________________________________________________________________  External Attachment:    Type:   Image     Comment:   External Document

## 2010-07-27 NOTE — Letter (Signed)
Summary: Remote Device Check  [facility], [place]  [id number] [place]   [place], [place] [address]   Phone: [phone]  Fax: [phone]     January 06, 2010 MRN: [id number]   [name] [place], [place]  [address]   Dear Ms. McFarland,   Your remote transmission was recieved and reviewed by your physician.  All diagnostics were within normal limits for you.  __X___Your next transmission is scheduled for:  03-18-2010.  Please transmit at any time this day.  If you have a wireless device your transmission will be sent automatically.   Sincerely,  Shelly Bombard

## 2010-07-27 NOTE — Assessment & Plan Note (Signed)
Summary: 3 MONTH FU-RECV REMINDER VS  Medications Added CITALOPRAM HYDROBROMIDE 20 MG TABS (CITALOPRAM HYDROBROMIDE) Take 1 tablet by mouth once a day      Allergies Added:   Referring Provider:  same Primary Provider:  [name], M D  CC:  follow-up visit.  History of Present Illness: the patient is a 77 year old female with history of ischemic cardiomyopathy. The patient is status post CRT implantation. The patient was previously hospitalized with incessant defibrillator shocks. This was secondary to lead fracture and the patient underwent revision of her defibrillator and LV lead. The patient also underwent bare-metal stenting to the LAD and right coronary artery. The patient has history of lung nodules and had a recent CT scan which showed complete resolution of the prior nodules.  The patient presents for followup today. She had a recent UTI and some knee problems that required steroid injection. She states she is under significant stress and is quite tearful. She actually had spontaneous crying spell in the office. She stated she feels fatigued most of the days, with a sensation of feeling washed out as well as insomnia. She appears to have a hard time coping. From a cardiac standpoint however she is stable with no chest pain shortness of breath orthopnea or PND.  The patient has been unable to take lisinopril, Pravachol and digoxin because all the medications appear to be causing nausea.  Clinical Review Panels:  Echocardiogram Echocardiogram Conclusions:         1. The estimated ejection fraction is 35-40%.          2. There are multiple regional wall motion abnormalities.          3. The right ventricular cavity size is normal.         4. An AICD lead is noted.          5. There is no evidence of aortic regurgitation.         6. There is mild mitral regurgitation.          7. There is mild tricuspid regurgitation.         8. The right ventricular systolic pressure is  calculated at 31 mmHg.          9. The inferior vena cava appears normal. (07/14/2008)    Preventive Screening-Counseling & Management  Alcohol-Tobacco     Smoking Status: quit     Year Quit: 2004  Current Medications (verified): 1)  Lantus 100 Unit/ml Soln (Insulin Glargine) .... Use As Directed 2)  Carvedilol 6.25 Mg Tabs (Carvedilol) .... Take 1 Tablet By Mouth Two Times A Day 3)  Lasix 80 Mg Tabs (Furosemide) .... Take 1 Tablet By Mouth Two Times A Day 4)  Klor-Con M20 20 Meq Cr-Tabs (Potassium Chloride Crys Cr) .... Take One By Mouth in The Morning and 1/2 in Evening 5)  Omeprazole 20 .Marland KitchenMarland Kitchen. 1 Daily 6)  Bayer Aspirin 325 Mg Tabs (Aspirin) .... Take 1 Tablet By Mouth Once A Day 7)  Vitamin B-12 1000 Mcg Tabs (Cyanocobalamin) .... Take 1 Tablet By Mouth Once A Day 8)  Alprazolam 0.5 Mg Tabs (Alprazolam) .... Take 1 Tablet By Mouth Once A Day At  Bedtime 9)  Tramadol Hcl 50 Mg Tabs (Tramadol Hcl) .... One-Two By Mouth Q 4-6 Hrs As Needed Pain 10)  Citalopram Hydrobromide 20 Mg Tabs (Citalopram Hydrobromide) .... Take 1 Tablet By Mouth Once A Day  Allergies (verified): 1)  ! Sulfa 2)  ! Metformin Hcl  Comments:  Nurse/Medical Assistant: The patient's medications and allergies were reviewed with the patient and were updated in the Medication and Allergy Lists. List reviewed.  Past History:  Past Medical History: diabetes heart attack 2006 anxiety reflux HYPERLIPIDEMIA-MIXED (ICD-272.4) CARDIOMYOPATHY, ISCHEMIC (A999333) SYSTOLIC HEART FAILURE, CHRONIC (ICD-428.22) SHOULDER PAIN (ICD-719.41) IMPINGEMENT SYNDROME (ICD-726.2) SHOULDER IMPINGEMENT SYNDROME, LEFT (ICD-726.2) History of apical aneurysm Chronic renal sufficiency 1. Chronic systolic heart failure.     a.     Ejection fraction 45% by echo in June 2008. 2. Ischemic cardiomyopathy.     a.     Moderate nonobstructive coronary artery disease by      catheterization in June 2008.     b.     Status post stenting  of the RCA in 2006.     c.     Status post Medtronic biventricular implantable cardioverter-defibrillator implantation.     d.     S/P RV lead revision after multiple ICD shocks for sprint fidelis fracture 3. 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 4. Insulin-dependent diabetes mellitus. 5. Dyslipidemia, STATIN intolerant. 6. Chronic renal sufficiency. 7. History of tobacco use discontinued. 8. Peripheral polyneuropathy refractory to several medications. catheterization in 2011 node for LAD and RCA stenosis status post stenting.  Review of Systems       The patient complains of fatigue and depression.  The patient denies malaise, fever, weight gain/loss, vision loss, decreased hearing, hoarseness, chest pain, palpitations, shortness of breath, prolonged cough, wheezing, sleep apnea, coughing up blood, abdominal pain, blood in stool, nausea, vomiting, diarrhea, heartburn, incontinence, blood in urine, muscle weakness, joint pain, leg swelling, rash, skin lesions, headache, fainting, dizziness, anxiety, enlarged lymph nodes, easy bruising or bleeding, and environmental allergies.         pain in the lower extremities. Secondary to neuropathy  Vital Signs:  Patient profile:   77 year old female Height:      66 inches Weight:      166 pounds Pulse rate:   65 / minute BP sitting:   107 / 68  (left arm) Cuff size:   regular  Vitals Entered By: Georgina Peer (October 08, 2009 10:29 AM) CC: follow-up visit   Physical Exam  Additional Exam:  General: Well-developed, well-nourished in no distress head: Normocephalic and atraumatic eyes PERRLA/EOMI intact, conjunctiva and lids normal nose: No deformity or lesions mouth normal dentition, normal posterior pharynx neck: Supple, no JVD.  No masses, thyromegaly or abnormal cervical nodes. No carotid bruits lungs: Normal breath sounds bilaterally without wheezing.  Normal percussion heart: regular rate and rhythm with normal S1 and  S2, no S3 or S4.  PMI is normal.  No pathological murmurs Chest: well-positioned  defibrillator with no erythema or swelling abdomen: Normal bowel sounds, abdomen is soft and nontender without masses, organomegaly or hernias noted.  No hepatosplenomegaly musculoskeletal: Back normal, normal gait muscle strength and tone normal pulsus: Pulse is normal in all 4 extremities Extremities: No peripheral pitting edema neurologic: Alert and oriented x 3 skin: Intact without lesions or rashes cervical nodes: No significant adenopathy psychologic: Normal affect     ICD Specifications Following MD:  Thompson Grayer, MD     ICD Vendor:  Medtronic     ICD Model Number:  [id number]     ICD Serial Number:  [id number] ICD DOI:  05/28/2009     ICD Implanting MD:  Virl Axe, MD  Lead 1:    Location: RA     DOI: 10/18/2004     Model #:  [id number]     Serial #: [id number]     Status: active Lead 2:    Location: RV     DOI: 10/18/2004     Model #: [id number]     Serial #: [id number]     Status: active Lead 3:    Location: LV     DOI: 10/18/2004     Model #: [id number]     Serial #: [id number]     Status: active Lead 4:    Location: RV     DOI: 05/28/2009     Model #: [id number]     Serial #: [id number]     Status: active  Indications::  ICM, CHF  Explantation Comments: 05/28/2009 Medtronic Sentry [id number] explanted.  ICD Follow Up ICD Dependent:  No       ICD Device Measurements Configuration: LV TIP TO RV COIL  Episodes Coumadin:  No  Brady Parameters Mode DDDR     Lower Rate Limit:  50     Upper Rate Limit 130 PAV 140     Sensed AV Delay:  110  Tachy Zones VF:  200     VT:  176     Impression & Recommendations:  Problem # 1:  PULMONARY NODULE (ICD-518.89) resolved on recent CT scan  Problem # 2:  CORONARY ARTERY DISEASE, S/P PTCA (ICD-414.9) no recurrent chest pain. The patient is unable to take statin drug therapy. Her updated medication list for this problem includes:    Carvedilol 6.25 Mg Tabs  (Carvedilol) .Marland Kitchen... Take 1 tablet by mouth two times a day    Bayer Aspirin 325 Mg Tabs (Aspirin) .Marland Kitchen... Take 1 tablet by mouth once a day  Problem # 3:  IMPLANTATION OF DEFIBRILLATOR, HX OF (ICD-V45.02) no recurrent complications and no discharges  Problem # 4:  LEFT VENTRICULAR FUNCTION, DECREASED (ICD-429.2) patient will be scheduled for a follow up echocardiogram. Her recent BNP level was normal. Her prior ejection fraction was 35-40% Orders: 2-D Echocardiogram (2D Echo)  Problem # 5:  DEPRESSION, SITUATIONAL (ICD-309.0) the patient clearly has depression. She will be started on citalopram 20 mg p.o. q. daily,which is safe in the setting of her LV dysfunction  Patient Instructions: 1)  Citalopram 20mg  daily 2)  Echo 3)  Follow up in  3 months Prescriptions: CITALOPRAM HYDROBROMIDE 20 MG TABS (CITALOPRAM HYDROBROMIDE) Take 1 tablet by mouth once a day  #30 x 2   Entered by:   Lovina Reach, LPN   Authorized by:   Terald Sleeper, MD, [facility]   Signed by:   Lovina Reach, LPN on [id number]   Method used:   Electronically to        [facility] (retail)       [address], [place]  [address]       Ph: [phone]       Fax: [phone]   RxID:   [id number]   Handout requested.

## 2010-07-27 NOTE — Assessment & Plan Note (Signed)
Summary: 3 MO FU PER JULY REMINDER-SRS  Medications Added OMEPRAZOLE 20 MG CPDR (OMEPRAZOLE) Take 1 tablet by mouth once a day DICYCLOMINE HCL 10 MG CAPS (DICYCLOMINE HCL) take one cap three times a day as needed for abdominal pain      Allergies Added:   Visit Type:  Follow-up Referring Provider:  same Primary Provider:  [name], M D   History of Present Illness: the patient is a 77 year old female who has history of an ischemic cardiomyopathy. She is status post CRT implantation. She underwent revision of a fractured defibrillator leads. Ejection fraction is roughly 45% by recent echo. There are multiple wall motion abnormalities. There is only mild left regurgitation. The patient has chronic dyspnea and is NYHA class 2B/3. This is however chronic. She denies any chest pain. She has been struggling with symptoms of depression. She was taken off citalopram earlier this year. The patient complains off frequent abdominal pain cramping bloating and constipation alternating with diarrhea. Showed an abdominal CT scan performed which was negative. She also had recurrent UTIs and has received over the last several months several courses of antibiotics which seem to have her abdominal symptoms made worse.  Cardiac standpoint the patient denies any chest pain chest no palpitations or syncope. She has reported no recurrent incessant countershocks. She was been evaluated previously for pulmonary nodules which have resolved.  Preventive Screening-Counseling & Management  Alcohol-Tobacco     Smoking Status: quit     Year Quit: 2004  Current Medications (verified): 1)  Lantus 100 Unit/ml Soln (Insulin Glargine) .... Use As Directed 2)  Carvedilol 6.25 Mg Tabs (Carvedilol) .... Take 1 Tablet By Mouth Two Times A Day 3)  Lasix 80 Mg Tabs (Furosemide) .... Take 1 Tablet By Mouth Two Times A Day 4)  Klor-Con M20 20 Meq Cr-Tabs (Potassium Chloride Crys Cr) .... Take One By Mouth in The Morning and  1/2 in Evening 5)  Omeprazole 20 Mg Cpdr (Omeprazole) .... Take 1 Tablet By Mouth Once A Day 6)  Bayer Aspirin 325 Mg Tabs (Aspirin) .... Take 1 Tablet By Mouth Once A Day 7)  Vitamin B-12 1000 Mcg Tabs (Cyanocobalamin) .... Take 1 Tablet By Mouth Once A Day 8)  Alprazolam 0.5 Mg Tabs (Alprazolam) .... Take 1 Tablet By Mouth Once A Day At  Bedtime 9)  Tramadol Hcl 50 Mg Tabs (Tramadol Hcl) .... One-Two By Mouth Q 4-6 Hrs As Needed Pain 10)  Dicyclomine Hcl 10 Mg Caps (Dicyclomine Hcl) .... Take One Cap Three Times A Day As Needed For Abdominal Pain  Allergies (verified): 1)  ! Sulfa 2)  ! Metformin Hcl  Comments:  Nurse/Medical Assistant: The patient's medication list and allergies were reviewed with the patient and were updated in the Medication and Allergy Lists.  Past History:  Past Medical History: Last updated: 10/08/2009 diabetes heart attack 2006 anxiety reflux HYPERLIPIDEMIA-MIXED (ICD-272.4) CARDIOMYOPATHY, ISCHEMIC (A999333) SYSTOLIC HEART FAILURE, CHRONIC (ICD-428.22) SHOULDER PAIN (ICD-719.41) IMPINGEMENT SYNDROME (ICD-726.2) SHOULDER IMPINGEMENT SYNDROME, LEFT (ICD-726.2) History of apical aneurysm Chronic renal sufficiency 1. Chronic systolic heart failure.     a.     Ejection fraction 45% by echo in June 2008. 2. Ischemic cardiomyopathy.     a.     Moderate nonobstructive coronary artery disease by      catheterization in June 2008.     b.     Status post stenting of the RCA in 2006.     c.     Status post Medtronic biventricular  implantable cardioverter-defibrillator implantation.     d.     S/P RV lead revision after multiple ICD shocks for sprint fidelis fracture 3. 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 4. Insulin-dependent diabetes mellitus. 5. Dyslipidemia, STATIN intolerant. 6. Chronic renal sufficiency. 7. History of tobacco use discontinued. 8. Peripheral polyneuropathy refractory to several medications. catheterization in 2011  node for LAD and RCA stenosis status post stenting.  Past Surgical History: Last updated: 09/18/2009 hysterectomy rt rcr defibrillater stent  Family History: Last updated: 07/03/2009 noncontributory  Social History: Last updated: 04/12/2009 Retired  Married  Tobacco Use - Former.  Alcohol Use - no  Risk Factors: Smoking Status: quit (01/19/2010)  Vital Signs:  Patient profile:   77 year old female Height:      66 inches Weight:      168 pounds Pulse rate:   77 / minute BP sitting:   99 / 69  (left arm) Cuff size:   large  Vitals Entered By: Georgina Peer (January 19, 2010 3:02 PM)  Physical Exam  Additional Exam:  General: Well-developed, well-nourished in no distress head: Normocephalic and atraumatic eyes PERRLA/EOMI intact, conjunctiva and lids normal nose: No deformity or lesions mouth normal dentition, normal posterior pharynx neck: Supple, no JVD.  No masses, thyromegaly or abnormal cervical nodes. No carotid bruits lungs: Normal breath sounds bilaterally without wheezing.  Normal percussion heart: regular rate and rhythm with normal S1 and S2, no S3 or S4.  PMI is normal.  No pathological murmurs Chest: well-positioned  defibrillator with no erythema or swelling abdomen: Normal bowel sounds, abdomen is soft and nontender without masses, organomegaly or hernias noted.  No hepatosplenomegaly musculoskeletal: Back normal, normal gait muscle strength and tone normal pulsus: Pulse is normal in all 4 extremities Extremities: No peripheral pitting edema neurologic: Alert and oriented x 3 skin: Intact without lesions or rashes cervical nodes: No significant adenopathy psychologic: Normal affect     ICD Specifications Following MD:  Thompson Grayer, MD     ICD Vendor:  Medtronic     ICD Model Number:  [id number]     ICD Serial Number:  [id number] ICD DOI:  05/28/2009     ICD Implanting MD:  Virl Axe, MD  Lead 1:    Location: RA     DOI: 10/18/2004     Model #:  [id number]     Serial #: [id number]     Status: active Lead 2:    Location: RV     DOI: 10/18/2004     Model #: [id number]     Serial #: [id number]     Status: active Lead 3:    Location: LV     DOI: 10/18/2004     Model #: [id number]     Serial #: [id number]     Status: active Lead 4:    Location: RV     DOI: 05/28/2009     Model #: [id number]     Serial #: [id number]     Status: active  Indications::  ICM, CHF  Explantation Comments: 05/28/2009 Medtronic Sentry [id number] H explanted.  ICD Follow Up ICD Dependent:  No       ICD Device Measurements Configuration: LV TIP TO RV COIL  Episodes Coumadin:  No  Brady Parameters Mode DDDR     Lower Rate Limit:  50     Upper Rate Limit 130 PAV 140     Sensed AV Delay:  110  Tachy Zones VF:  200     VT:  176     Impression & Recommendations:  Problem # 1:  SHORTNESS OF BREATH (ICD-786.05) stable the patient is ischemic cardiomyopathy with ejection fraction has slightly improved compared to previous. She is a good medical regimen. Her updated medication list for this problem includes:    Carvedilol 6.25 Mg Tabs (Carvedilol) .Marland Kitchen... Take 1 tablet by mouth two times a day    Lasix 80 Mg Tabs (Furosemide) .Marland Kitchen... Take 1 tablet by mouth two times a day    Bayer Aspirin 325 Mg Tabs (Aspirin) .Marland Kitchen... Take 1 tablet by mouth once a day  Orders: T-Comprehensive Metabolic Panel (A999333) T-CBC No Diff [id number]) T-Lipid Profile [id number]) T-TSH [id number])  Problem # 2:  IMPLANTATION OF DEFIBRILLATOR, HX OF (ICD-V45.02) no recurrent defibrillator discharges.  Problem # 3:  CORONARY ARTERY DISEASE, S/P PTCA (ICD-414.9) earlier this year the patient underwent bare-metal stenting to the LAD and right coronary artery. She reports no recurrent chest pain. Her updated medication list for this problem includes:    Carvedilol 6.25 Mg Tabs (Carvedilol) .Marland Kitchen... Take 1 tablet by mouth two times a day    Bayer Aspirin 325 Mg Tabs (Aspirin) .Marland Kitchen... Take 1 tablet by mouth  once a day  Problem # 4:  DEPRESSION, SITUATIONAL (ICD-309.0) the patient still appears depressed. I feel that she does will need an antidepressant but would leave that up to her primary care physician. A good choice may be an SNRI which could have some beneficial effects on her painful diabetic neuropathy.  Problem # 5:  CARDIOMYOPATHY, ISCHEMIC (ICD-414.8) Assessment: Comment Only  Her updated medication list for this problem includes:    Carvedilol 6.25 Mg Tabs (Carvedilol) .Marland Kitchen... Take 1 tablet by mouth two times a day    Lasix 80 Mg Tabs (Furosemide) .Marland Kitchen... Take 1 tablet by mouth two times a day    Bayer Aspirin 325 Mg Tabs (Aspirin) .Marland Kitchen... Take 1 tablet by mouth once a day  Problem # 6:  ABDOMINAL PAIN, GENERALIZED (ICD-789.07) I suspect patient's IBS symptoms have worsened because of her multiple rounds of antibiotics. I have given her a prescription for p.r.n. dicyclomine. I also asked her to discuss it with her gastroenterologist who she will be seeing tomorrow. She appears to be needing a colonoscopy also. I'm not sure if the patient has been screened for any ova or parasites or gluten enteropathy.  Patient Instructions: 1)  Dicyclomine 10mg  three times a day as needed abdominal pain 2)  Labs:  fasting lipid panel, CBC, CMET, TSH 3)  Follow up in  3 months  Prescriptions: DICYCLOMINE HCL 10 MG CAPS (DICYCLOMINE HCL) take one cap three times a day as needed for abdominal pain  #30 x 1   Entered by:   Lovina Reach, LPN   Authorized by:   Terald Sleeper, MD, [facility]   Signed by:   Lovina Reach, LPN on [id number]   Method used:   Electronically to        [facility] (retail)       [address], [place]  [address]       Ph: [phone]       Fax: [phone]   RxID:   [id number]   Handout requested.

## 2010-07-27 NOTE — Progress Notes (Signed)
Summary: Increased Optivol   Phone Note Outgoing Call Call back at [facility] Phone [phone]   Summary of Call: Received call from [place] in our [facility] regarding increased [place]. Spoke with Dr. Lutricia Feil who recommends pt increase Lasix for a few days. Pt states she missed yesterday's dose of Lasix d/t having a colonoscopy and didn't take any Lasix yet today. She normally takes 80mg  two times a day. She will take 2 tablets this pm. She will then take 2 tablets every morning and 1 tablet every evening for 3 days. She will come to the office on Wed for Optivol check and have BMET/BNP done at the [facility] the same day. Pt verbalized understanding.  Initial call taken by: Gurney Maxin, RN, BSN,  March 19, 2010 3:42 PM

## 2010-07-29 NOTE — Cardiovascular Report (Signed)
Summary: Office Visit Remote   Office Visit Remote   Imported By: Sallee Provencal 07/09/2010 10:51:39  _____________________________________________________________________  External Attachment:    Type:   Image     Comment:   External Document

## 2010-07-29 NOTE — Letter (Signed)
Summary: Remote Device Check  [facility] [place]  [id number] [place]   [place], [place] [address]   Phone: [phone]  Fax: [phone]     July 07, 2010 MRN: [id number]   [place], [place]  [address]   Dear Ms. Melba,   Your remote transmission was recieved and reviewed by your physician.  All diagnostics were within normal limits for you.   __X____Your next office visit is scheduled for:  March 2012 with Dr Rayann Heman in [place] office. Please call our office to schedule an appointment.    Sincerely,  Shelly Bombard

## 2010-08-04 NOTE — Letter (Signed)
Summary: External Correspondence/ OFFICE VISIT DR. TAPPER  External Correspondence/ OFFICE VISIT DR. TAPPER   Imported By: Bartholomew Boards 07/27/2010 16:53:39  _____________________________________________________________________  External Attachment:    Type:   Image     Comment:   External Document

## 2010-08-10 ENCOUNTER — Ambulatory Visit (INDEPENDENT_AMBULATORY_CARE_PROVIDER_SITE_OTHER): Payer: Medicare Other | Admitting: Cardiology

## 2010-08-10 ENCOUNTER — Encounter: Payer: Self-pay | Admitting: Cardiology

## 2010-08-10 DIAGNOSIS — I951 Orthostatic hypotension: Secondary | ICD-10-CM | POA: Insufficient documentation

## 2010-08-10 DIAGNOSIS — R0602 Shortness of breath: Secondary | ICD-10-CM

## 2010-08-10 DIAGNOSIS — I2510 Atherosclerotic heart disease of native coronary artery without angina pectoris: Secondary | ICD-10-CM

## 2010-09-02 NOTE — Assessment & Plan Note (Signed)
Summary: 3 MO FU/SRS      Allergies Added:   Visit Type:  Follow-up Referring Provider:  same Primary Provider:  [name], M D   History of Present Illness: The patient is ischemic cardiomyopathy.  She status post CRT-D.  The patient had an echocardiogram done November 2011.  Her ejection fraction remained stable at 30 to 35%. She also had blood work done by [facility]. Hemoglobin stable at 13 mg percent, liver function tests and electrolytes were within normal limits. Creatinine has been stable at 1.25.  BNP level was also normal in September 2011. The patient has a remote device checked on in her fluid status was stable by [employer]. This was as recently as June 24, 2010.  There were no discharges. The patient is status post stent placement to the RCA 2006 she had nonobstructive coronary disease by catheterization in 2008.  The patient did have repeat cardiac catheterization in 2010 and underwent stenting of the left anterior descending artery with a bare-metal stent.  She also underwent coronary intervention to the right coronary artery with a bare-metal stent. The patient is statin intolerant. The patient reports and dizziness upon standing.  Her blood pressure is relatively low.  Just stopped taking lisinopril on the room.  Next line the patient has chronic dyspnea but this has been unchanged.  She denies any substernal chest pain.  Clinically she appears to have no significant volume overload. The patient had some issues with possible gout although she states she still has chronic pain in her right thumb which is likely related to neuropathy because her index finger and middle finger are also involved and are painful.  There is no obvious swelling.  Asked patient to discuss this with the neurologist. In addition she showed me a lesion on her back is concerned about malignancy.  The patient did have several moles removed recently and although the lesion does not look malignant to me I  asked her to follow-up closely and fairly soon with her dermatologist for further evaluation.   Preventive Screening-Counseling & Management  Alcohol-Tobacco     Smoking Status: quit     Year Quit: 2004  Current Medications (verified): 1)  Lantus 100 Unit/ml Soln (Insulin Glargine) .... Use As Directed 2)  Carvedilol 6.25 Mg Tabs (Carvedilol) .... Take 1 Tablet By Mouth Two Times A Day 3)  Lasix 80 Mg Tabs (Furosemide) .... Take 1 Tablet By Mouth Two Times A Day 4)  Klor-Con M20 20 Meq Cr-Tabs (Potassium Chloride Crys Cr) .... Take 1 Tablet By Mouth Two Times A Day 5)  Omeprazole 20 Mg Cpdr (Omeprazole) .... Take 1 Tablet By Mouth Once A Day 6)  Bayer Aspirin 325 Mg Tabs (Aspirin) .... Take 1 Tablet By Mouth Once A Day 7)  Vitamin B-12 1000 Mcg Tabs (Cyanocobalamin) .... Take 1 Tablet By Mouth Once A Day 8)  Alprazolam 0.5 Mg Tabs (Alprazolam) .... Take 1 Tablet By Mouth Once A Day At  Bedtime 9)  Tramadol Hcl 50 Mg Tabs (Tramadol Hcl) .... One-Two By Mouth Q 4-6 Hrs As Needed Pain 10)  Citalopram Hydrobromide 20 Mg Tabs (Citalopram Hydrobromide) .... Take 1 Tablet By Mouth Once A Day  Allergies (verified): 1)  ! Sulfa 2)  ! Metformin Hcl  Comments:  Nurse/Medical Assistant: The patient's medication list and allergies were reviewed with the patient and were updated in the Medication and Allergy Lists.  Past History:  Past Medical History: Last updated: 04/28/2010 diabetes heart attack 2006 anxiety reflux  HYPERLIPIDEMIA-MIXED (ICD-272.4) CARDIOMYOPATHY, ISCHEMIC (A999333) SYSTOLIC HEART FAILURE, CHRONIC (ICD-428.22) SHOULDER PAIN (ICD-719.41) IMPINGEMENT SYNDROME (ICD-726.2) SHOULDER IMPINGEMENT SYNDROME, LEFT (ICD-726.2) History of apical aneurysm Chronic renal sufficiency 1. Chronic systolic heart failure.     a.     Ejection fraction 45% by echo in June 2008. 2. Ischemic cardiomyopathy.     a.     Moderate nonobstructive coronary artery disease by       catheterization in June 2008.     b.     Status post stenting of the RCA in 2006.     c.     Status post Medtronic biventricular implantable cardioverter-defibrillator implantation.     d.     S/P RV lead revision after multiple ICD shocks for sprint fidelis fracture 3. 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 4. Insulin-dependent diabetes mellitus. 5. Dyslipidemia, STATIN intolerant. 6. Chronic renal sufficiency. 7. History of tobacco use discontinued. 8. Peripheral polyneuropathy refractory to several medications. 2010:. Successful percutaneous intervention of the right coronary artery     using a nondrug-eluting platform. Successful percutaneous intervention of the left anterior     descending artery also using a nondrug-eluting platform. Creatinine 1.17 January 2010 Lipid panel July 2011 triglycerides 266 cholesterol 173 HDL 28 LDL 92 VLDL 53, hemoglobin A1c 6.3  Past Surgical History: Last updated: 09/18/2009 hysterectomy rt rcr defibrillater stent  Family History: Last updated: 07/03/2009 noncontributory  Social History: Last updated: 04/12/2009 Retired  Married  Tobacco Use - Former.  Alcohol Use - no  Risk Factors: Smoking Status: quit (08/10/2010)  Review of Systems       The patient complains of fatigue and shortness of breath.  The patient denies malaise, fever, weight gain/loss, vision loss, decreased hearing, hoarseness, chest pain, palpitations, prolonged cough, wheezing, sleep apnea, coughing up blood, abdominal pain, blood in stool, nausea, vomiting, diarrhea, heartburn, incontinence, blood in urine, muscle weakness, joint pain, leg swelling, rash, skin lesions, headache, fainting, dizziness, depression, anxiety, enlarged lymph nodes, easy bruising or bleeding, and environmental allergies.    Vital Signs:  Patient profile:   77 year old female Height:      66 inches Weight:      169 pounds Pulse rate:   74 / minute Pulse (ortho):   75 /  minute BP sitting:   100 / 61  (left arm) BP standing:   109 / 72 Cuff size:   large  Vitals Entered By: Georgina Peer (August 10, 2010 10:33 AM)  Serial Vital Signs/Assessments:  Time      Position  BP       Pulse  Resp  Temp     By 11:16 AM  Lying RA  110/71   7                    [facility], LPN [id number] AM  Sitting   112/54   69                    [facility], LPN [id number] AM  Standing  109/72   75                    [facility], LPN [id number] AM  Standing  119/74   76                    [place], LPN [id number] AM  Standing  110/71   74  Lovina Reach, LPN   Physical Exam  Additional Exam:  General: Well-developed, well-nourished in no distress head: Normocephalic and atraumatic eyes PERRLA/EOMI intact, conjunctiva and lids normal nose: No deformity or lesions mouth normal dentition, normal posterior pharynx neck: Supple, no JVD.  No masses, thyromegaly or abnormal cervical nodes. No carotid bruits lungs: Normal breath sounds bilaterally without wheezing.  Normal percussion heart: regular rate and rhythm with normal S1 and S2, no S3 or S4.  PMI is normal.  No pathological murmurs Chest: well-positioned  defibrillator with no erythema or swelling abdomen: Normal bowel sounds, abdomen is soft and nontender without masses, organomegaly or hernias noted.  No hepatosplenomegaly musculoskeletal: Back normal, normal gait muscle strength and tone normal pulsus: Pulse is normal in all 4 extremities Extremities: No peripheral pitting edema neurologic: Alert and oriented x 3 skin: Intact without lesions or rashes cervical nodes: No significant adenopathy psychologic: Normal affect    EKG  Procedure date:  08/10/2010  Findings:      normal sinus rhythm with ventricular pacing   ICD Specifications Following MD:  Thompson Grayer, MD     ICD Vendor:  Medtronic     ICD Model Number:  [id number]     ICD Serial Number:  [id number] ICD DOI:  05/28/2009     ICD Implanting MD:  Virl Axe, MD  Lead 1:    Location: RA     DOI: 10/18/2004     Model #: [id number]     Serial #: [id number]     Status: active Lead 2:    Location: RV     DOI: 10/18/2004     Model #: [id number]     Serial #: [id number]     Status: active Lead 3:    Location: LV     DOI: 10/18/2004     Model #: [id number]     Serial #: [id number]     Status: active Lead 4:    Location: RV     DOI: 05/28/2009     Model #: [id number]     Serial #: [id number]     Status: active  Indications::  ICM, CHF  Explantation Comments: 05/28/2009 Medtronic Sentry [id number] explanted.  ICD Follow Up ICD Dependent:  No       ICD Device Measurements Configuration: LV TIP TO RV COIL  Episodes Coumadin:  No  Brady Parameters Mode DDDR     Lower Rate Limit:  50     Upper Rate Limit 130 PAV 140     Sensed AV Delay:  110  Tachy Zones VF:  200     VT:  176     Impression & Recommendations:  Problem # 1:  SHORTNESS OF BREATH (ICD-786.05) patient has chronic shortness of breath.   She has only dysfunction.  She is status post CRT-D.  However her BNP level was normal in 2011.  She also had a recent device check and was noted that her Leanne Lovely was within normal limits. The following medications were removed from the medication list:    Lisinopril 5 Mg Tabs (Lisinopril) .Marland Kitchen... Take 1 tablet by mouth once a day Her updated medication list for this problem includes:    Carvedilol 6.25 Mg Tabs (Carvedilol) .Marland Kitchen... Take 1 tablet by mouth two times a day    Lasix 80 Mg Tabs (Furosemide) .Marland Kitchen... Take 1 tablet by mouth two times a day    Bayer Aspirin 325 Mg Tabs (Aspirin) .Marland Kitchen... Take 1 tablet by mouth once a day  Problem # 2:  CORONARY ARTERY DISEASE, S/P PTCA (ICD-414.9) no recurrent chest pain.   Continue current medical therapy. The following medications were removed from the medication list:    Lisinopril 5 Mg Tabs (Lisinopril) .Marland Kitchen... Take 1 tablet by mouth once a day Her updated medication list for this problem includes:    Carvedilol 6.25 Mg Tabs  (Carvedilol) .Marland Kitchen... Take 1 tablet by mouth two times a day    Bayer Aspirin 325 Mg Tabs (Aspirin) .Marland Kitchen... Take 1 tablet by mouth once a day  Problem # 3:  HYPOTENSION, ORTHOSTATIC (ICD-458.0) will check orthostatic blood pressures.   Will hold off lisinopril for now.  If the patient is orthostatic this could be related to dysautonomia in the setting of her diabetes mellitus, but I doubt that she is volume depleted.  Problem # 4:  IMPLANTATION OF DEFIBRILLATOR, HX OF (ICD-V45.02) Assessment: Comment Only  Other Orders: EKG w/ Interpretation (93000)  Patient Instructions: 1)  Your physician recommends that you continue on your current medications as directed. Please refer to the Current Medication list given to you today. 2)  Follow up in  6 months

## 2010-09-08 ENCOUNTER — Encounter: Payer: Self-pay | Admitting: *Deleted

## 2010-09-20 ENCOUNTER — Encounter: Payer: Self-pay | Admitting: Internal Medicine

## 2010-09-28 LAB — BASIC METABOLIC PANEL
BUN: 11 mg/dL (ref 6–23)
BUN: 13 mg/dL (ref 6–23)
BUN: 19 mg/dL (ref 6–23)
CO2: 33 mEq/L — ABNORMAL HIGH (ref 19–32)
Calcium: 8.7 mg/dL (ref 8.4–10.5)
Chloride: 100 mEq/L (ref 96–112)
Chloride: 98 mEq/L (ref 96–112)
Creatinine, Ser: 1.04 mg/dL (ref 0.4–1.2)
Creatinine, Ser: 1.17 mg/dL (ref 0.4–1.2)
Creatinine, Ser: 1.18 mg/dL (ref 0.4–1.2)
GFR calc Af Amer: 49 mL/min — ABNORMAL LOW (ref >60)
GFR calc Af Amer: 54 mL/min — ABNORMAL LOW (ref >60)
GFR calc Af Amer: 55 mL/min — ABNORMAL LOW (ref >60)
GFR calc non Af Amer: 40 mL/min — ABNORMAL LOW (ref >60)
GFR calc non Af Amer: 52 mL/min — ABNORMAL LOW (ref >60)
Potassium: 3.7 mEq/L (ref 3.5–5.1)
Potassium: 4.1 mEq/L (ref 3.5–5.1)
Sodium: 137 mEq/L (ref 135–145)

## 2010-09-28 LAB — GLUCOSE, CAPILLARY
Glucose-Capillary: 150 mg/dL — ABNORMAL HIGH (ref 70–99)
Glucose-Capillary: 178 mg/dL — ABNORMAL HIGH (ref 70–99)
Glucose-Capillary: 181 mg/dL — ABNORMAL HIGH (ref 70–99)
Glucose-Capillary: 186 mg/dL — ABNORMAL HIGH (ref 70–99)
Glucose-Capillary: 188 mg/dL — ABNORMAL HIGH (ref 70–99)
Glucose-Capillary: 191 mg/dL — ABNORMAL HIGH (ref 70–99)
Glucose-Capillary: 235 mg/dL — ABNORMAL HIGH (ref 70–99)
Glucose-Capillary: 247 mg/dL — ABNORMAL HIGH (ref 70–99)
Glucose-Capillary: 250 mg/dL — ABNORMAL HIGH (ref 70–99)
Glucose-Capillary: 254 mg/dL — ABNORMAL HIGH (ref 70–99)
Glucose-Capillary: 255 mg/dL — ABNORMAL HIGH (ref 70–99)
Glucose-Capillary: 262 mg/dL — ABNORMAL HIGH (ref 70–99)
Glucose-Capillary: 276 mg/dL — ABNORMAL HIGH (ref 70–99)
Glucose-Capillary: 303 mg/dL — ABNORMAL HIGH (ref 70–99)
Glucose-Capillary: 312 mg/dL — ABNORMAL HIGH (ref 70–99)
Glucose-Capillary: 345 mg/dL — ABNORMAL HIGH (ref 70–99)

## 2010-09-28 LAB — CBC
HCT: 32.7 % — ABNORMAL LOW (ref 36.0–46.0)
Hemoglobin: 11.5 g/dL — ABNORMAL LOW (ref 12.0–15.0)
MCHC: 34.5 g/dL (ref 30.0–36.0)
MCV: 89.4 fL (ref 78.0–100.0)
MCV: 90 fL (ref 78.0–100.0)
Platelets: 157 10*3/uL (ref 150–400)
Platelets: 164 10*3/uL (ref 150–400)
RBC: 3.47 MIL/uL — ABNORMAL LOW (ref 3.87–5.11)
RBC: 3.66 MIL/uL — ABNORMAL LOW (ref 3.87–5.11)
WBC: 5.8 10*3/uL (ref 4.0–10.5)
WBC: 6 10*3/uL (ref 4.0–10.5)
WBC: 7.7 10*3/uL (ref 4.0–10.5)

## 2010-09-28 LAB — LIPID PANEL
Cholesterol: 148 mg/dL (ref 0–200)
HDL: 25 mg/dL — ABNORMAL LOW (ref >39)
Triglycerides: 337 mg/dL — ABNORMAL HIGH (ref <150)

## 2010-10-18 ENCOUNTER — Other Ambulatory Visit: Payer: Self-pay | Admitting: Cardiology

## 2010-10-29 ENCOUNTER — Other Ambulatory Visit: Payer: Self-pay | Admitting: *Deleted

## 2010-10-29 MED ORDER — FUROSEMIDE 80 MG PO TABS: 80.0000 mg | ORAL_TABLET | Freq: Two times a day (BID) | ORAL | Status: DC

## 2010-11-09 NOTE — Assessment & Plan Note (Signed)
[facility]                          [facility] OFFICE NOTE   NAME:[name], [name]                MRN:          [id number]  DATE:10/25/2007                            DOB:          February 02, 1934    PRIMARY CARE PHYSICIAN:  Dr. Terald Sleeper.   PRIMARY ELECTROPHYSIOLOGIST:  Dr. Virl Axe, in [place].   REASON FOR VISIT:  Scheduled 37-month follow-up.   Since last seen by me here in the clinic in December 2008, the patient  reports that she became progressively short of breath approximately 1  month ago.  She presented to Dr. Scotty Court for blood work, and was also  instructed to increase her Lasix dose.  She took three additional 80 mg  tablets in the course of 1 week, and then resorted back to 80 b.i.d.,  which I had originally recommended back in December.  She states that  she subsequently began feeling better and, in fact, has diuresed 7  pounds by today's weight.   However, she feels that her shortness of breath is recurring over these  past 2-3 days.  She denies any PND, lower extremity edema, chest pain,  tachy palpitations, or shocks from her defibrillator.   Interrogation of her Medtronic OptiVol device shows progressive increase  in fluid from late January to mid March, followed by a dramatic drop  with a slight recent upward trend.   CURRENT MEDICATIONS:  1. Lasix 80 b.i.d.  2. Potassium 20 q.a.m./10 q.p.m.  3. Lantus 75 units daily.  4. Glyburide 10 b.i.d.  5. Coreg 6.25 b.i.d.  6. Aspirin 81 daily.  7. Omeprazole.  8. Citalopram.   PHYSICAL EXAMINATION:  Blood pressure 120/69, pulse 70, regular, weight  174.8 (down 7), sats 97% on room air.  LUNGS:  Mild right basilar crackles, clear on the left.  No wheezes.   Electrocardiogram  today reveals atrial ventricular pacing at 70 bpm.   IMPRESSION:  1. Chronic systolic heart failure.  2. Severe ischemic cardiomyopathy.      a.     As previously outlined.      b.     EF 45% by  2-D echo, June 2008.      c.     Status post stenting of RCA in 2006:  Moderate in-stent       restenosis by cardiac catheterization, June 2008.      d.     Status post BiV implantable cardioverter-defibrillator       implantation, April 2006.  3. Chronic renal insufficiency.      a.     History of hyperkalemia.      b.     Normal bilateral renal arteries, August 2008.  4. Insulin-dependent diabetes mellitus.  5. Dyslipidemia.  6. 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   PLAN:  1. Surveillance blood work with CMET, CBC/differential, and BNP level.  2. Two-view chest x-ray.  3. Increase Lasix 120 mg q.a.m/80 mg  q.p.m. for more aggressive      management of possible, recurrent volume overload.  4. Follow-up BMET in 1 week, for continued close monitoring of  electrolytes and renal function.  5. Return clinic follow-up with myself in 2 weeks.      [name], PA-C  Electronically Signed      [name], MD,FACC  Electronically Signed   GS/MedQ  DD: 10/25/2007  DT: 10/25/2007  Job #: [id number]   cc:   [name]

## 2010-11-09 NOTE — Assessment & Plan Note (Signed)
[facility]                          [facility]ICE NOTE   NAME:[name], [name]                MRN:          [id number]  DATE:12/11/2008                            DOB:          May 14, 1934    REFERRING PHYSICIAN:  [name]   HISTORY OF PRESENT ILLNESS:  The patient is a 77 year old female with a  history of moderate nonobstructive coronary disease, status post  stenting of the RCA in 2006, status post Medtronic biventricular ICD  implantation.  The patient has actually done well since her last visit.  She is short of present in exertion.  She is NYHA class IIB, but has  remained stable. She denies any orthopnea or PND.  She had an optimal  test done today which showed that she is euvolemic.  She denies any  chest pain, palpitations, or syncope.  The patient reports that she has  significant burning in her leg secondary to neuropathy.  She apparently  was tried on Lyrica, Neurontin, and Cymbalta without good success.   MEDICATIONS:  1. Lantus insulin 6 units in the a.m. and 6 units in the p.m.  2. Carvedilol 6.25 mg p.o. b.i.d.  3. Lasix 80 mg p.o. b.i.d.  4. Omeprazole 20 mg p.o. daily.  5. Aspirin 81 mg p.o. daily.  6. B12 1000 mcg p.o. daily.  7. Alprazolam 0.5 mg p.o. nightly.  8. Potassium 20 mEq in the morning and 10 in the evening.   PHYSICAL EXAMINATION:  VITAL SIGNS:  Blood pressure is 127/75, heart  rate 74, and weight 173.  NECK:  Normal carotid upstroke and no carotid bruits.  LUNGS:  Clear breath sounds bilaterally.  HEART:  Regular rate and rhythm.  Normal S1 and S2.  No murmurs, rubs,  or gallops.  ABDOMEN:  Soft, nontender.  No rebound or guarding.  Good bowel sounds.  EXTREMITIES:  No cyanosis, clubbing, or edema.  NEUROLOGIC:  The patient is alert, oriented, and grossly nonfocal.   PROBLEM LIST:  1. Chronic systolic heart failure.      a.     Ejection fraction 45% by echo in June 2008.  2. Ischemic  cardiomyopathy.      a.     Moderate nonobstructive coronary artery disease by       catheterization in June 2008.      b.     Status post stenting of the RCA in 2006.      c.     Status post Medtronic biventricular implantable cardioverter-       defibrillator implantation.  3. 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  4. Insulin-dependent diabetes mellitus.  5. Dyslipidemia, STATIN intolerant.  6. Chronic renal sufficiency.  7. History of tobacco use discontinued.  8. Peripheral polyneuropathy refractory to several medications.   PLAN:  1. The patient's main complaint is really her painful peripheral      neuropathy.  She is asking me to have any other thoughts on medical      treatment.  I wrote for the patient for some capsaicin cream and      oral (alpha-lipoic acid) at 600 mg  a day.  I told her to talk with      her physician further about the dosing this regimen if needed.  2. From cardiac standpoint, the patient is actually doing quite well      since she euvolemic.  I made no change in her diuretic regimen.  3. The patient can follow up with [place] in the next couple of months.     [name], MD,FACC  Electronically Signed    GED/MedQ  DD: 12/11/2008  DT: 12/12/2008  Job #: [id number]   cc:   [name]

## 2010-11-09 NOTE — Assessment & Plan Note (Signed)
[facility] OFFICE NOTE   NAME:Esquivel, NARIAH AMEDEE                MRN:          [id number]  DATE:06/26/2007                            DOB:          [birth year]    ADDENDUM/CORRECTION  Under impression please add just one more line.  Under where I state  ischemic cardiomyopathy, make the next line chronic systolic heart  failure.  That is chronic systolic heart failure, just please insert  that.     [name], PA-C     GS/MedQ  DD: 06/26/2007  DT: 06/26/2007  Job #: [id number]

## 2010-11-09 NOTE — Cardiovascular Report (Signed)
NAME:  [name], [name]         ACCOUNT NO.:  [id number]   MEDICAL RECORD NO.:  [id number]          PATIENT TYPE:  INP   LOCATION:  [id number]                         FACILITY:  [facility]   PHYSICIAN:  Juanda Bond. Burt Knack, MD  DATE OF BIRTH:  [birth year]   DATE OF PROCEDURE:  DATE OF DISCHARGE:                            CARDIAC CATHETERIZATION   DATE OF PROCEDURE:  December 13, 2006   PROCEDURE:  Right heart catheterization, left heart catheterization,  selective coronary angiography, IVUS of the right coronary artery and  Star close of the right femoral artery.   INDICATIONS:  [name] is a 77 year old woman with progressive  congestive heart failure.  She has known coronary artery disease and has  had stenting to the right coronary artery.  With her marked symptoms and  difficulty in management she was referred for right and left heart  catheterization to assess her hemodynamics and coronary anatomy.   Risks and indications of procedure were explained to the patient.  Informed consent was obtained.  Right groin was prepped, draped and  anesthetized with 1% lidocaine.  Using modified Seldinger technique a 7-  French sheath was placed in the right femoral vein and a 6-French sheath  was placed in the right femoral artery.  A Swan-Ganz catheter was used  to measure pressures throughout the right heart and collect oxygen  saturations.  Thermodilution and Fick cardiac output were both  calculated.  Following the right heart catheterization, standard 6-  French Judkins catheters were used to selectively image the left and  right coronary arteries in multiple planes.  An angled pigtail catheter  was then placed in the left ventricle and pressures were recorded.  A  pullback across the aortic valve was done.  The left ventriculogram was  deferred because of renal insufficiency.   At the conclusion of the diagnostic procedure the patient was found to  have moderate in-stent restenosis of the  right coronary artery.  She has  a known prior infarct in this territory.  I elected to IVUS this vessel  to rule out high-grade stenosis.  Angiomax was used for anticoagulation.  Intracoronary nitroglycerin was given and JR-4 guide catheter was  inserted and a cougar guidewire was easily passed beyond the area of  interest.  Automated pullback was performed and the IVUS demonstrated  moderate restenosis just at the proximal edge of the vessel.  The  minimal lumen area was in the range of 3.7 to 4 square millimeters.  This places this as a borderline lesion and I elected not to intervene  because angiographically the area is in the range of 60-70% and the IVUS  criteria are only borderline in an area of previous infarction.  At that  point the wire catheters were removed.  The final image showed no change  in the appearance of the vessel.  A Star close device was used to seal  the femoral arteriotomy.  The patient will be transferred to  the  holding area where her venous sheath will be pulled.  She will be placed  on 2 hours of bedrest.   FINDINGS:  Right atrial pressure 8,  right ventricular pressure is 24/5.  PA pressure is 20/5, with mean of 12.  Pulmonary capillary wedge  pressure is 15, aortic pressure 117/59 with a mean of 83, left  ventricular pressure 119/7 with an EDP of 11.  Oxygen saturation in the  right pulmonary artery is 60%, aorta is 95%.  Fick cardiac output is 3  liters per minute.  Cardiac index is 1.5 liters per minute per meter  squared.  Thermodilution cardiac outputs 3.80 liters per minute.  Cardiac index is 1.9 liters per minute per meter squared.   CORONARY ANGIOGRAPHY:  The left mainstem has mild calcification and  minimal nonobstructive disease.  It bifurcates into the LAD and left  circumflex.   The LAD is a medium-size vessel that courses down to the distal anterior  wall.  It provides a large first diagonal branch and a medium-size  second diagonal  branch.  The second diagonal has a 50% ostial stenosis.  The midportion of the LAD just beyond the second diagonal branch has an  area of 60% stenosis.  The remaining portions of the LAD and its  diagonal branches are free of any significant disease.   The left circumflex is a large-caliber vessel that courses down and  provides a small first OM branch and a large left posterolateral branch.  The midportion of the circumflex just in the area of the OM branch has a  long tubular 60% stenosis.   The right coronary artery is a large-caliber vessel.  There is a stent  in the midportion of the vessel.  Just on the proximal edge of the stent  there is an area of moderate restenosis in the range of 60-70%.  The  vessel has diffuse luminal irregularities throughout.  Distally it  terminates in a large PDA branch as well as a posterolateral branch.  The appearance of the coronary artery suggests that that portion of the  heart is akinetic.   ASSESSMENT:  1. Moderate three-vessel coronary artery disease.  2. Normal right heart hemodynamics.  3. Low cardiac output.   PLAN:  As described above, intravascular ultrasound of the right  coronary artery was performed.  There is not severe stenosis in that  area.  Therefore, I elected not to perform any intervention.  I suggest  medical therapy for the patient's coronary artery disease.  Will review  her hemodynamics with Dr. Haroldine Laws who is going to assist in management  of her congestive heart failure.      Juanda Bond. Burt Knack, MD  Electronically Signed     MDC/MEDQ  D:  12/13/2006  T:  12/13/2006  Job:  [id number]   cc:   [name], MD,FACC  [name], MD  [name]

## 2010-11-09 NOTE — Assessment & Plan Note (Signed)
[facility] OFFICE NOTE   NAME:Drabik, HISAE COUGILL                MRN:          [id number]  DATE:11/17/2006                            DOB:          [birth year]    HISTORY OF PRESENT ILLNESS:  The patient is a 77 year old female with  ischemic cardiomyopathy and LV dysfunction.  The patient just recently  lost her husband after a protracted chronic illness.  She has been  coping rather well.  She does not exhibit manifested signs of  depression.  She states that she does feel fatigued, and tired, and  continues to experience a decline in her stamina and exercise tolerance.  She also is short of breath on minimal exertion.  However, these  symptoms have been rather chronic.  She does not exhibit florid heart  failure symptoms.  As a matter of fact, Dr. Scotty Court had to cut back on  her Lasix due to worsening prerenal azotemia.  She denies any substernal  chest pain.  She denies orthopnea, palpitations, or syncope.   MEDICATIONS:  1. Aspirin 81 mg a day.  2. Benicar 10 mg p.o. daily.  3. Glyburide 10 mg p.o. b.i.d.  4. Lantus 70 units nightly.  5. Lasix 80 mg in the morning, and 40 mg in the evening.  6. Aldactone 25 daily.  7. Coreg 6.25 b.i.d.  8. Xanax p.r.n.   PHYSICAL EXAMINATION:  VITAL SIGNS:  Blood pressure is 104/60.  Heart  rate is 70 beats per minute.  Weight is 180 pounds.  Saturation 95% on  room air.  GENERAL:  Somewhat frail appearing white female, in no acute distress.  HEENT:  Pupils anisocoric.  Conjunctivae clear.  NECK:  Supple.  Normal carotid upstroke.  No carotid bruits.  JVD is  flat, and is approximately 6 cm.  HEART:  Regular rate and rhythm with normal S1 and S2.  Soft systolic  murmur.  ABDOMEN:  Soft.  EXTREMITY EXAM:  No cyanosis, clubbing, or edema.  NEUROLOGIC:  Patient is alert and oriented.  Grossly nonfocal.   PROBLEM LIST:  1. Ischemic cardiomyopathy.      a.      Status post out-of-hospital inferior wall myocardial       infarction with elective stent to the right coronary artery in       2006.      b.     Patent right coronary artery.      c.     Moderate residual nonobstructive disease by catheterization       August 2006.      d.     Status post biventricular implantable cardioverter       defibrillator April 2006.  2. Chronic systolic heart failure (ejection fraction 25%).  3. History of aortic aneurysm.  4. Insulin dependent diabetes mellitus.  5. Dyslipidemia.  6. Lung nodule.  7. Recent nonischemic Cardiolite with a large inferior scar with an      ejection fraction of 45%.   PLAN:  1. Based on the Cardiolite study, the patient's ejection fraction has      actually improved.  I  do not think that she is exhibiting worsening      heart failure symptoms.  2. I suspect the patient has a large component of deconditioning, and      I have encouraged her to start walking, and start an exercise      program.  She will also to go the mall and have her ICD OptiVol      measurement made on a regular basis.  3. I agree currently with the recommendation of Lasix 80 mg in the      morning, and 40 mg in the evening.  I have made no other      adjustments in her medical regimen.     [name], MD,FACC  Electronically Signed    GED/MedQ  DD: 11/17/2006  DT: 11/17/2006  Job #: [id number]   cc:   [name]

## 2010-11-09 NOTE — Assessment & Plan Note (Signed)
[facility]                          [facility]   NAME:Gratz, ANNALYNNE FIUME                MRN:          [id number]  DATE:07/04/2008                            DOB:          1933-10-13    PRIMARY CARDIOLOGIST:  [name], MD, [facility]   REASON FOR VISIT:  Scheduled followup.   Mr. Poelman returns to our clinic, since last seen here in May, by  Dr. [name].  More recently, however, she did present to our [facility]  for regular scheduled followup, with no reported changes of her  defibrillator device.  Blood work was drawn at that time, per Dr. Caryl Comes,  indicating stable, chronic renal insufficiency with a creatinine of 1.3  and a potassium of 3.9.  Of note, BNP level was 73.   Clinically, Ms. Koch does not present with any symptoms suggestive  of decompensated systolic heart failure.  Her weight is unchanged since  her last visit.  She does suggest some constant midsternal chest  pressure, which has been present for 2 weeks, for which she has not  taken any nitroglycerin.  She denies any exacerbation with deep  inspiration, swallowing food, or movement.  She does have chronic  exertional dyspnea.   The patient has not smoked in 4 years.  She also denies any firing of  her defibrillator.   Ms. Joshi notes that she did not tolerate CRESTOR, which we  initially had recommended, nor ZOCOR, which I subsequently recommended  at time of her last lipid profile in September.  Of note, her total  cholesterol was 217 at that time, with an HDL of 19 and triglycerides of  440.   CURRENT MEDICATIONS:  1. Lantus 60 units b.i.d.  2. Glyburide 10 mg b.i.d.  3. Carvedilol 6.25 b.i.d.  4. Lasix 80 b.i.d.  5. Potassium 20 mEq b.i.d.  6. Omeprazole 20 daily.  7. Aspirin 81 daily.  8. Alprazolam 0.5 nightly.   PHYSICAL EXAMINATION:  VITAL SIGNS:  Blood pressure 128/71; pulse 76,  regular; pulse oximetry 95% on room air; and  weight 174.8.  GENERAL:  A 77 year old female comes sitting upright, no distress.  HEENT:  Normocephalic and atraumatic.  NECK:  Palpable bilateral carotid pulse without bruits; no JVD at 90  degrees.  LUNGS:  Clear to auscultation in all fields.  HEART:  Regular rate and rhythm.  No significant murmurs.  No rubs.  ABDOMEN:  Soft and nontender.  EXTREMITIES:  No pedal edema.  NEURO:  Flat affect, but no focal deficit.   IMPRESSION:  1. Chronic systolic heart failure.      a.     EF 45% by echo, June 2008.  2. Ischemic cardiomyopathy.      a.     Moderate, nonobstructive CAD by cardiac catheterization,       June 2008.      b.     Status post stenting of RCA in 2006.      c.     Status post Medtronic biventricular ICD implantation.  3. 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  4. Insulin-dependent diabetes mellitus  5. Dyslipidemia.  a.     STATIN intolerant.  6. Chronic renal insufficiency.  7. History of tobacco.   PLAN:  1. Interrogate OptiVol device for better clarification of her baseline      volume status.  Of note, however, she does not appear to be in      decompensated heart failure, from a clinical standpoint.  Her      weight is unchanged and her lungs are clear on examination, nor      does she give a history of PND, orthopnea, or lower extremity      edema.  Of note, we have adjusted her diuretic regimen in the past      and, in fact, down titrated her Lasix to the current dose, when she      was last seen here in the clinic.  She has had evidence of mild      exacerbation of her chronic renal insufficiency, when we have      previously uptitrated Lasix.  2. Recommend a surveillance echocardiogram for reassessment of left      ventricle function.  This was recommended by Dr. [name] when she      was last seen here in the clinic, although it does not appear that      this was scheduled.  3. The patient was strongly advised to initiate some  cholesterol-      lowering therapy, and she has agreed to resume fish oil on a      regular basis.  We will, therefore, initiate this with 1 capsule      b.i.d.  She will then need a follow up fasting lipid profile in 12      weeks.  4. Schedule return clinic followup with myself and Dr. [name] in 4      months.   ADDENDUM:  Review of her OptiVol device has been faxed, indicating well-  controlled volume status with a continued downward trend in her thoracic  impedance, dating back to early December.  Therefore, we will continue  current dose of Lasix.      Mannie Stabile, PA-C  Electronically Signed      Carlena Bjornstad, MD, [facility]  Electronically Signed   GS/MedQ  DD: 07/04/2008  DT: 07/05/2008  Job #: [id number]   cc:   [name]

## 2010-11-09 NOTE — Assessment & Plan Note (Signed)
[facility]                          [facility] NOTE   NAME:[name], [name]                MRN:          [id number]  DATE:01/11/2007                            DOB:          1933-12-11    PRIMARY CARDIOLOGIST:  Dr. Terald Sleeper.   REASON FOR VISIT:  Scheduled clinic followup. Please refer to Dr.  Arlina Robes recent clinic note of July 3, for full details.   At that time, the patient was continued on her regular home medication  regimen and was merely referred for followup blood work.  However, she  was found to have significant elevation of her creatinine to 2.3 with an  associated BUN of 35 and potassium of 5.8. This represented a  significant increase in her renal dysfunction from a previous creatinine  of 1.2 back in January of this year, at which time she had had up  titration of her Lasix to 80 b.i.d. with stable renal function. Of note,  her potassium at that time was 3.3 with the patient being on aldactone.  Most recently, therefore, the patient had cessation of her aldactone,  Benicar, and supplemental potassium as well as Lasix. She had a series  of followup BMETs which showed stabilization and subsequent improvement  of the creatinine from that peak of 2.3, to a level of 1.4 just  yesterday. Her potassium did, however, require some treatment with 30  grams of Kayexalate and hydration. Her potassium yesterday was 4.1.   Clinically, the patient reports moderate exertional dyspnea and even  some at rest. She denies any chest pain, however. She also denies any  PND, orthopnea, or lower extremity edema and, in fact, states that she  has never had any pedal edema. She also reports compliance with her  medications and abstinence from dietary sodium.   CURRENT MEDICATIONS:  1. Low dose aspirin.  2. Glyburide 10 b.i.d.  3. Nexium.  4. Coreg 6.25 b.i.d.  5. Lantus 70 units q.h.s.  6. Celexa 10 daily.  7. Lasix 40 mg q.a.m.   REASON FOR VISIT:  Of note, Lasix was resumed at 40 mg daily just 3 days  ago.   PHYSICAL EXAMINATION:  Blood pressure 119/71, pulse 74 and regular,  weight 181 (previously 180.6). Sats 96% on room air.  GENERAL:  A 77 year old female sitting upright in no distress.  HEENT:  Normocephalic, atraumatic.  NECK:  Palpable bilateral carotid pulses without bruits; no JVD at 90  degrees.  LUNGS:  Mild crackles in the left base, with no associated wheezes.  HEART:  Regular rate and rhythm (S1, S2), no significant murmurs. No S3.  ABDOMEN:  Soft, nontender.  EXTREMITIES:  Palpable pulses without significant edema.  NEUROLOGIC:  Flat affect, but no focal deficit.   IMPRESSION:  1. Persistent exertional dyspnea.      a.     Ejection fraction 45% by both 2-D echo in June 2008 and by       adenosine stress Cardiolite in April of this year.  2. Chronic systolic heart failure.      a.     Ejection fraction previously 25%.  3. Ischemic  cardiomyopathy.      a.     Status post stent right coronary artery in 2006 with       moderate in-stent restenosis by cardiac catheterization, June       2008.      b.     Status post biventricular implantable cardioverter-       defibrillator implantation, April 2006.  4. History of apical aneurysm.      a.     Previously on Coumadin.  5. Insulin-dependent diabetes mellitus.  6. Dyslipidemia.   PLAN:  1. Reassessment of Optival shows a definite upward trend with respect      to volume status over the past several weeks. Lasix was recently      resumed albeit at 40 mg daily and, in light of this current data,      we will up titrate it to 40 mg b.i.d. Given the patient's recent      documentation of improved left ventricular function, however, it      may be that she can no longer tolerate the previous higher doses of      Lasix of 120-160 mg daily.  2. Check a followup BMET on Monday, July 21, for close monitoring of      electrolytes and renal function.   3. Schedule renal ultrasound with [facility] in [place] to exclude      renal artery stenosis, in light of the patient's recent significant      rise in creatinine.  4. Schedule a return clinic followup with myself and Dr. [name] in 4-6      weeks for review of study results, and further recommendations.      [name], PA-C  Electronically Signed      [name], MD,FACC  Electronically Signed   GS/MedQ  DD: 01/11/2007  DT: 01/12/2007  Job #: [id number]   cc:   [name]

## 2010-11-09 NOTE — Assessment & Plan Note (Signed)
[place] OFFICE NOTE   NAME:[name], [name]                MRN:          [id number]  DATE:12/28/2006                            DOB:          03/12/1934    HISTORY OF PRESENT ILLNESS:  Patient is a 77 year old female with a  history of ischemic cardiomyopathy and LV dysfunction.  The patient, due  to increased dyspnea, was referred to [facility] for left and  right heart catheterization.  She was found to have normal right-sided  hemodynamics with no evidence of volume overload.  Her cardiac index was  low at 1.5 liters per minute by thermodilution, and also her PA  saturation was 60%.  The patient also underwent catheterization and was  found to have significant end-stent restenosis of the right coronary  artery.  However, an IVUS was performed, and Dr. Burt Knack felt that this  could be treated medically.  She was then set up for a CPX test, and she  could not complete the study.  Apparently, she was quite hypoxemic and  required, briefly, oxygen.  She states that ever since she did the  study, she feels, washed out.  I did not call for the results, as it  appears that there were no meaningful results obtained from this  particular CPX study.  I did walk the patient in the hall today for a  few minutes, and her baseline sat was 95%.  After exercise, it was 94%  and remained stable.  She denies any substernal chest pain.  She has no  orthopnea or PND.  She does complain of significant weakness.   MEDICATIONS:  1. Aspirin 81 mg p.o. daily.  2. Benicar 10 mg p.o. daily.  3. Glyburide 10 mg p.o. b.i.d.  4. Nexium 40 mg p.o. daily.  5. Aldactone 25 mg p.o. daily.  6. Coreg 6.25 b.i.d.  7. Lantus 70 units nightly.  8. Lasix 80 mg twice daily.  9. Celexa.  10.Potassium.  11.Alprazolam p.r.n.  12.Optival was measured today, and the patient had no evidence of      volume overload.   PHYSICAL  EXAMINATION:  VITAL SIGNS:  Blood pressure 194/56, heart rate  68.  NECK:  Normal carotid upstrokes.  No carotid bruits.  LUNGS:  Clear breath sounds bilaterally.  HEART:  Regular rate and rhythm.  Normal S1 and S2.  No murmurs, gallops  or rubs.  ABDOMEN:  Soft.  EXTREMITIES:  No clubbing, cyanosis or edema.  NEURO:  Patient alert and oriented.  Grossly nonfocal.   PROBLEM LIST:  1. Ischemic cardiomyopathy.      a.     Status post possible inferior wall myocardial infarction.      b.     Elective stent to the right coronary artery in 2006.      c.     Moderate end-stent restenosis by recent IVUS.      d.     Status post biventricular implantable cardioverter       defibrillator.  2. Chronic systolic heart failure.  Ejection fraction 25% but improved  on the last echocardiographic study at [facility].  3. History of aortic aneurysm.  4. Insulin-dependent diabetes mellitus.  5. Dyslipidemia.  6. Lung nodule.   PLAN:  1. The patient's dyspnea and decreased exercise tolerance remains      somewhat puzzling, although it is clear that she has a low output      state,  __________  ; however, with her latest finding by      echocardiography.  She also does not desaturate on exertion.  2. At this point, I will check a CBC and a BMET.  We will continue to      follow the patient and make no changes in her medical regimen.  I      think there is also a large component of deconditioning.  The      patient needs to engage in an exercise program.  3. As stated, the patient will follow up with [place] in the next couple of      months.     [name], MD,FACC  Electronically Signed    GED/MedQ  DD: 12/28/2006  DT: 12/29/2006  Job #: [id number]

## 2010-11-09 NOTE — Discharge Summary (Signed)
NAME:  BLESS, HANSELMAN         ACCOUNT NO.:  [id number]   MEDICAL RECORD NO.:  [id number]          PATIENT TYPE:  INP   LOCATION:  [id number]                         FACILITY:  [facility]   PHYSICIAN:  Shaune Pascal. Bensimhon, MDDATE OF BIRTH:  1934-02-18   DATE OF ADMISSION:  12/12/2006  DATE OF DISCHARGE:  12/14/2006                               DISCHARGE SUMMARY   PROCEDURES:  1. Left heart cardiac catheterization.  2. Right heart cardiac catheterization.  3. Coronary angiogram.  4. Intravascular ultrasound to the RCA.   TIME OF DISCHARGE:  Forty minutes.   PRIMARY DIAGNOSES:  1. Acute on chronic systolic congestive heart failure.  2. Diabetes.  3. Allergy or intolerance to sulfa.  4. Ischemic cardiomyopathy.  5. History of out-of-hospital inferior wall myocardial infarction with      a stent to the right coronary artery in 2006.  6. Status post Medtronic biventricular implantable cardioverter-      defibrillator in 2006.  7. History of apical aneurysm previously on Coumadin therapy.  8. Dyslipidemia.  9. Remote history of lung nodule, no abnormalities seen on chest x-ray      this admission.  10.History of abdominal pain with ongoing evaluation.  11.Chronic renal insufficiency with a BUN of 37 and a creatinine 1.5      at discharge.   HOSPITAL COURSE:  Ms. Cogswell is a 77 year old female with a history  of ischemic cardiomyopathy and coronary artery disease.  She had an  increase in her dyspnea and was at [facility].  She was evaluated  by Dr. [name] and it was felt that she needed a right and left heart  catheterization.  She was transferred to [facility].   The left heart catheterization showed a 50% stenosis in the diagonal,  60% in the LAD, 60% in the circumflex, and 60-70% in the RCA.  The  lesion in the RCA was at the edge of the previously placed stent.  An  intravascular ultrasound was performed which showed moderate disease.  Medical therapy was  recommended.  The right heart catheterization showed  a PA pressure of 20/5 with a mean of 12 and a wedge 15.  RA pressure was  8.  Cardiac output was 3 with an index of 1.5 by Fick.   On December 14, 2006, she was evaluated by Dr. Haroldine Laws.  She was  transitioned back to Lasix 80 mg p.o. b.i.d.  He recommended a CPX  stress test and this has been arranged.  If her VO2 is decreased, then a  course of anatrops can be considered as well as other advanced  therapies.  Digoxin was added to her medication regimen but her other  home meds are unchanged.  She was considered stable for discharge with  outpatient followup arranged.   DISCHARGE INSTRUCTIONS:  1. Her activity level is to be increased gradually.  2. She is to stick to a low-fat, diabetic diet.  3. She is to follow up with Dr. Scotty Court as needed.  4. She has a followup with Dr. [name] on July 3rd at 2:15.  5. She has a CPX stress test on June  26th at 1 p.m.  She cannot eat or      drink 3 hours before and needs to check in at admitting in at      12:30.   DISCHARGE MEDICATIONS:  1. Digoxin 0.125 mg on Monday, Wednesday, and Friday.  2. Lantus 70 units q.h.s.  3. Glyburide 5 mg, two tabs b.i.d.  4. Coreg 6.25 mg b.i.d.  5. Lasix 80 mg p.o. b.i.d.  6. Spirolactone 25 mg daily.  7. Celexa 10 mg daily.  8. Benicar 10 mg daily.  9. Nexium 40 mg daily.  10.Aspirin 81 mg daily.  11.Potassium 10 mEq daily.  12.Alprazolam 0.25 mg q.h.s.  13.Acular drops both eyes b.i.d.  14.Pred Forte eye drops, 1 drop O.D. b.i.d.      Rosaria Ferries, PA-C      Shaune Pascal. Bensimhon, MD  Electronically Signed    RB/MEDQ  D:  12/14/2006  T:  12/14/2006  Job:  [id number]   cc:   Rowe Pavy, Dr.  [facility]  [place], [place]

## 2010-11-09 NOTE — Assessment & Plan Note (Signed)
[facility]                          [facility]ICE NOTE   NAME:[name], [name]                MRN:          [id number]  DATE:11/05/2007                            DOB:          16-Jun-1934    REFERRING PHYSICIAN:  [name]   HISTORY OF PRESENT ILLNESS:  The patient is an elderly female with  nonischemic cardiomyopathy, objection fraction by echo was 45% in June  of 2008.  She denies any substernal chest pain.  She continues to be  short of breath on exertion.  However, she is recently recovering from  shingles on the right lower extremity.  She remains deconditioned.  She  however has had no exacerbations of heart failure.  Her laboratory work  was reviewed and her creatinine was very mildly elevated, but she has  had no further problems with hyperkalemia.   MEDICATIONS:  1. Omeprazole.  2. Citalopram.  3. Potassium.  4. Lantus.  5. Glyburide.  6. Lasix 3 in the morning and 1 in the evening.  7. Coreg 6.25 mg p.o. b.i.d.   PHYSICAL EXAMINATION:  VITAL SIGNS:  Blood pressure 120/69.  Heart rate  is 82.  Weight is 174 pounds.  NECK:  Normal carotid upstroke and no carotid bruits.  LUNGS:  Clear breath sounds bilaterally.  HEART:  Regular rate and rhythm.  Normal S1, S2.  No murmur, rubs or  gallops.  ABDOMEN:  Soft, nontender.  No rebound or guarding.  Good bowel sounds.  EXTREMITIES:  No cyanosis, clubbing or edema.   PROBLEM LIST:  1. Chronic systolic heart failure.  2. Severe ischemic cardiomyopathy.  Ejection fraction 45%.  3. Coronary artery disease status post stent to RCA in 2006.  4. Moderate, nonobstructive coronary artery disease by catheterization      in June of 2008.  5. Chronic renal insufficiency, but stable.  Normal renal arteries.  6. Insulin dependent diabetes mellitus.  7. Dyslipidemia.  8. History of apical aneurysm previously on Coumadin, discontinued.   PLAN:  1. The patient continues to be short  of breath, an NYHA class 2B/3.      However, there is no objective evidence of heart failure.  We will      repeat an electrocardiographic study to reassess the ejection      fraction.  2. I also told the patient to cut back the Lasix to 80 mg b.i.d.  3. The patient can follow up with [place] in the next couple of months.     Shirley Mcmurray, MD,FACC  Electronically Signed    GED/MedQ  DD: 11/05/2007  DT: 11/05/2007  Job #: [id number]   cc:   [name]

## 2010-11-09 NOTE — Assessment & Plan Note (Signed)
[facility]                          [facility] OFFICE NOTE   NAME:Greenbaum, MOSES CARLAND                MRN:          [id number]  DATE:06/26/2007                            DOB:          Nov 04, 1933    PRIMARY CARDIOLOGIST:  Dr. Terald Sleeper.   PRIMARY ELECTROPHYSIOLOGIST:  Dr. Virl Axe, in [place].   REASON FOR VISIT:  Scheduled followup.  Please refer to my previous  office note of September 4, for full details.   Since last seen in the clinic, the patient states that she has been  doing well until the recent past.  She feels that she has gained a  little fluid and has become a bit more short of breath with exertion.  However, she denies any PND, orthopnea, or lower extremity edema.  She  is compliant with medications and refrains from any added salt in the  diet.   The patient has had recent interrogation of her defibrillator, by Dr.  Caryl Comes here in [place], on December 19.  Her underlying rhythm was noted to  be NSR and she had one documented mode switch, lasting 4 seconds.  The  low/high rates are set at 60/140.   The patient has gained approximately 1 pound since her last visit.   When the patient was last seen here in the clinic, I suggested  increasing Lasix 80 b.i.d. for treatment of persistent volume overload,  as confirmed by Optival readings.  However, she informs me today that  she never increased her Lasix dose, and has remained on 80/40 daily.   CURRENT MEDICATIONS:  1. Lasix 80 mg q. a.m. /20 q.p.m.  2. Potassium 20 mg q. a.m. and 10 mg q.p.m.  3. Lantus 70 units q.h.s.  4. Coreg 6.25 b.i.d.  5. Glyburide 10 b.i.d.  6. Aspirin 81 daily.  7. Omeprazole 20 daily.  8. Citalopram 10 daily.  9. Lyrica 75 b.i.d.  10.Oxycodone 5/325 q.6h p.r.n.   PHYSICAL EXAMINATION:  Blood pressure 124/73, pulse 72, regular weight  181.4 (approximately 1 pound increase), sats 96% room air.  GENERAL:  A 77 year old female sitting upright in no  distress.  HEENT:  Normocephalic, atraumatic.  NECK:  Palpable carotid pulses without bruits; jugular venous pulsation  noted to below the right jaw, at 90 degrees.  LUNGS:  Diminished breath sounds at bases, but without crackles or  wheezes.  Heart rate and rhythm (S1, S2).  No significant murmurs.  No S3.  ABDOMEN:  Protuberant, nontender.  EXTREMITIES:  No pedal edema.  NEURO:  Very flat affect, but no focal deficit.   IMPRESSION:  1. Recurrent volume overload.      a.     Confirmed by Optival reading, indicating upward trend since       November 25.  2. Ischemic cardiomyopathy.      a.     EF of 45% by 2-D echo, June 2008.      b.     Previous history of EF 25%, by 2-D echo in 2006.      c.     Status post stent RCA in 2006; moderate in-stent restenosis  by cardiac catheterization, June 2008.      d.     Status post BiV ICD implantation, April 2006.  3. Chronic systolic heart failure.  4. History of apical aneurysm.      a.     Previously on Coumadin.  5. Insulin-dependent diabetes mellitus.  6. Dyslipidemia.  7. Mild, chronic renal insufficiency.      a.     Previously associated with hyperkalemia.      b.     Normal bilateral renal arteries, August 2008.   PLAN:  1. Recommend, once again, up titration of Lasix to 80 mg b.i.d.  2. Baseline BNP today and follow up BMP/BNP level in 1 week.  Most      recent blood work, per Dr. Scotty Court, indicates stable, mild renal      insufficiency with a creatinine of 1.09 and a BUN of 17.  3. Initiate lipid lowering therapy with simvastatin 40 mg nightly.      Follow up fasting lipid/liver profile in 3 months.  4. Schedule return clinic followup in 3 months with myself and Dr.      Langley Gauss, PA-C  Electronically Signed      [name], MD,FACC  Electronically Signed   GS/MedQ  DD: 06/26/2007  DT: 06/26/2007  Job #: [id number]   cc:   [name]

## 2010-11-09 NOTE — Assessment & Plan Note (Signed)
[place] OFFICE NOTE   NAME:Herbers, [name]                MRN:          [id number]  DATE:03/01/2007                            DOB:          04/11/1934    PRIMARY CARDIOLOGIST:  [name], MD,FACC.   REASON FOR VISIT:  Scheduled clinic followup.   Please refer to my previous clinic  note of July 17th, for full details.  At that time, I up titrated Lasix to f40 b.i.d. given the noted upward  trend on the Optival chart.  Followup blood work showed stable renal  failure with a creatinine of 1.3 and potassium of 3.9.  Clinically,  however, the patient essentially reports no significant change.  She  continues to have brisk diuresis but did take an additional 40 mg tablet  of Lasix this past Sunday and Monday, but with no palpable effect.  She  continues to report no PND, orthopnea, and has never had lower extremity  edema.  She also denies any chest pain.  Her shortness of breath is  precipitated by moderate exertion and she in general feels easily  fatigued and states that she is too weak to breath.   CURRENT MEDICATIONS:  1. Lasix 40 b.i.d.  2. Alprazolam 0.25 q.h.s.  3. Eye drops.  4. Lantus 70 units q.h.s.  5. Coreg 6.25 b.i.d.  6. Glyburide 10 b.i.d.  7. Aspirin 81 daily.  8. Citalopram 10 daily.  9. Omeprazole 20 daily.   PHYSICAL EXAMINATION:  VITAL SIGNS:  Blood pressure 124/68, pulse 75  regular, weight 180.8 (previously 181).  Optival chart today suggests  continued upward trend since early July.  GENERAL:  A 77 year old female sitting upright, sluggish appearing but  in no distress.  HEENT:  Normocephalic atraumatic.  NECK:  Palpable carotid pulses with no bruits.  Jugular venous pulsation  noted just below the right jaw at 90 degrees.  LUNGS:  Clear to auscultation all fields.  HEART:  Regular rate and rhythm (S1 S2).  No significant murmurs.  no  S3.  ABDOMEN:  Benign.  EXTREMITIES:  No edema.  NEUROLOGIC:  Very flat affect but no focal deficit.   IMPRESSION:  1. Persistent exertional dyspnea.      a.     Continued upward trend by Optival monitoring with respect to       volume status.      b.     EF 45% by 2D echo, June 2008.  2. Chronic systolic heart failure.      a.     Previous history of ejection fraction 25%.  3. Ischemic cardiomyopathy.      a.     Status post stent RCA in 2006, moderate in-stent restenosis       by cardiac catheterization in June 2008.      b.     Status post BiV ICD implantation, April 2006.  4. History of apical aneurysm previously on Coumadin.  5. Insulin-dependent-diabetes mellitus.  6. Dyslipidemia, currently not on a Statin.  7. History of renal insufficiency with associated hyperkalemia.      a.  Normal bilateral renal arteries by a recent renal       ultrasound.   PLAN:  1. Following review of current Optival readings with Dr. [name],      recommendation is to continue aggressive management of persistent      volume overload.  We will therefore further up titrate Lasix to 80      b.i.d. and add potassium at 20 b.i.d. with close monitoring of      electrolytes and renal function.  We will check a complete      metabolic profile as well as a BNP level and TSH today with a      repeat B-MET in 1 week.  2. Schedule early clinic followup with myself and Dr. [name] in 2      weeks for continued close monitoring of volume status and further      recommendations.      [name], PA-C  Electronically Signed      [name], MD,FACC  Electronically Signed   GS/MedQ  DD: 03/01/2007  DT: 03/01/2007  Job #: [id number]   cc:   [name]

## 2010-11-12 NOTE — Assessment & Plan Note (Signed)
[facility] OFFICE NOTE   NAME:[name]                MRN:          [id number]  DATE:07/25/2006                            DOB:          [birth year]    HISTORY OF PRESENT ILLNESS:  The patient is a 77 year old female with a  history of ischemic cardiomyopathy.  This patient was recently seen in  the office on July 13, 2006.  She reports increased shortness of  breath and weight gain.  Evaluation of the Medtronic __________ device  suggested significant increase in her volume status.  We instructed to  increase the patient's Lasix to 80 mg b.i.d.  She has done this and is  much improved.  She states she is less short of breath and her exercise  tolerance is slightly improved.  The patient still remains at NYHA class  IIB, however.   MEDICATIONS:  1. Lasix 80 mg p.o. b.i.d.  2. Aldactone 25 mg p.o. daily.  3. Coreg 6.25 mg b.i.d.  4. Lantus insulin 55 units.  5. Nexium.  6. Lexapro.  7. Glyburide 10 mg b.i.d.  8. Benicar 10 mg p.o. daily.  9. Aspirin 81 mg p.o. daily.   PHYSICAL EXAMINATION:  VITAL SIGNS:  Blood pressure 110/62, heart rate  65.  Weight is 181 pounds.  GENERAL:  A well-nourished white female in no apparent distress.  HEENT:  Pupils isocoric.  Conjunctivae clear.  NECK:  Supple with normal carotid upstroke.  No carotid bruits.  LUNGS:  Clear breath sounds bilaterally.  HEART:  Regular rate and rhythm.  Normal S1 and S2.  No definite S3.  ABDOMEN:  Soft.  EXTREMITIES:  No cyanosis, clubbing or edema.  NEUROLOGIC:  The patient is alert and oriented.   PROBLEM LIST:  1. Chronic systolic heart failure.      a.     Ejection fraction 25%.  2. Ischemic cardiomyopathy.      a.     Status post out-of-hospital inferior wall myocardial       infarction with elective stent to the right coronary artery,       February 2006.      b.     Widely patent right coronary artery, except  moderate       residual nonobstructive coronary artery disease by       catheterization, August 2006.      c.     Status post biventricular implantable cardioverter-       defibrillator, April 2006.  3. History of apical aneurysm.      a.     On Coumadin therapy.  4. Insulin-dependent diabetes mellitus.  5. Dyslipidemia.  6. History of lung nodule.   PLAN:  1. The patient will have a followup BMET, which has already been      scheduled.  2. I made no changes in her Lasix and will continue with 80 mg b.i.d.,      as she has responded nicely to this.  3. The patient can follow up with [place] in the [facility] in 2      months.  [name], MD,FACC  Electronically Signed    GED/MedQ  DD: 07/25/2006  DT: 07/25/2006  Job #: [id number]   cc:   [name]

## 2010-11-12 NOTE — Cardiovascular Report (Signed)
[name], [name]         ACCOUNT NO.:  [id number]   MEDICAL RECORD NO.:  [id number]          PATIENT TYPE:  INP   LOCATION:  6524                         FACILITY:  [facility]   PHYSICIAN:  Ethelle Lyon, M.D. LHCDATE OF BIRTH:  [birth year]   DATE OF PROCEDURE:  08/09/2004  DATE OF DISCHARGE:                              CARDIAC CATHETERIZATION   PROCEDURE:  Drug-eluting stent placement in mid right coronary artery using  filter wire distal protection.   INDICATIONS:  [name] is a 77 year old lady who apparently suffered  out-of-hospital inferior myocardial infarction in December. She now presents  with class III exertional dyspnea that Dr. [name] has been concerned is due  to her anginal equivalent. She underwent diagnostic angiography this morning  demonstrating 90% stenosis of the mid RCA with a heavy thrombus burden. I  recommended admission to Hospital for percutaneous revascularization.   PROCEDURAL TECHNIQUE:  Informed consent was obtained. There was a  preexisting right femoral arterial sheath. This was upsized to 7-French over  wire. Anticoagulation was then initiated with heparin and double bolus up of  five applied to achieve and maintain ACT of greater than 200 seconds. A 7-  [place] JR-4 guide was advanced over wire and engaged in the ostium of the  RCA. A Prowater wire was then advanced beyond the lesion with mild  difficulty. Given the very large thrombus that appeared to be at least 59  weeks old, I decided to proceed with filter wire distal protection. This was  advanced across the lesion without difficulty. Before deployment of the  filter, the Prowater wire was removed. Apposition of the filter was  confirmed in two orthogonal views.   I then proceed with predilation of the lesion using a 2.5 x 15 mm Maverick.  The lesion was then stented using a 3.0 x 23 mm Cypher at 16 atmospheres.  The entirety of the stent was then postdilated using a 3.25 mm  PowerSail at  16 atmospheres. During removal of the postdilation balloon, the filter wire  was moved back into the stent. It was not, however, tangled in the stent. I  retrieved it without difficulty. Final angiography demonstrated no residual  stenosis, no dissection, and TIMI-3 flow to the distal vasculature. The  patient tolerated the procedure well and was transferred to the holding room  in stable condition. There was no significant debris found in the filter.   COMPLICATIONS:  None.   IMPRESSION AND RECOMMENDATIONS:  Successful drug-eluting stent placement in  mid right coronary artery using filter wire distal protection. She should be  continued on Plavix for at least a year. Aspirin will be continued  indefinitely. Coumadin will be initiated for apical aneurysm. A statin  will be initiated.      WED/MEDQ  D:  08/09/2004  T:  08/09/2004  Job:  [id number]   cc:   [name], M.D.   [name], M.D. [facility]

## 2010-11-12 NOTE — Assessment & Plan Note (Signed)
[place] OFFICE NOTE   NAME:[name], [name]                MRN:          [id number]  DATE:07/13/2006                            DOB:          [birth year]    PRIMARY CARDIOLOGIST:  Dr. [name]   REASON FOR VISIT:  Scheduled 2 month follow up here.  Please refer to  Dr.  Arlina Robes clinic note of November 16 for full details.   Since that time, the patient reports that she has done okay, she seems  to have become slightly more short of breath over these past 2-3 days.  Nevertheless, she reports no significant change in her weight, denies  any PND or lower extremity edema, and reports no significant exertional  dyspnea within the confines of her home.  She does not leave the  immediate surroundings very much.   Interrogation of the Medtronic OptiVol device does, however, suggest a  slowly progressive increase in volume since the middle of November.  Of  note, when last seen here in the clinic the patient was instructed to  increase Lasix to 80 b.i.d. x1 week, and then to resume previous dose.  She does not, however, apparently recall whether or not she started  feeling better on that increased dose.   CURRENT MEDICATIONS:  1. Lasix 80 two a.m./40 nightly.  2. Aldactone 45 daily.  3. Coreg 6.25 b.i.d.  4. Lantus 55 units nightly.  5. Nexium.  6. Lexapro 5 daily.  7. Glyburide 10 b.i.d.  8. Benicar 10 daily.  9. Aspirin 81 daily.   PHYSICAL EXAMINATION:  Blood pressure 102/58, pulse 66, regular.  Weight  181 (down 2 pounds).  GENERAL:  A 77 year old female, lethargic, but in no distress.  HEENT:  Normocephalic, atraumatic.  NECK:  Palpable bilateral carotid pulse with no bruits.  LUNGS:  Clear to auscultation in all fields.  HEART:  Regular rate and rhythm (S1, S2).  No significant murmurs, no  S3.  ABDOMEN:  Protuberant, nontender.  EXTREMITIES:  Palpable pulses without edema.  NEURO:  Flat  affect.   IMPRESSION:  1. Chronic systolic heart failure.      a.     Ejection fraction 25% by echocardiogram March 2006.  2. Ischemic cardiomyopathy.      a.     Status post out of hospital inferior myocardial       infarction/elective stent right coronary artery February 2006 .      b.     Widely patent right coronary artery stent with moderate       residual non-obstructive coronary artery disease by cardiac       catheterization August 2006.      c.     Status post biventricular implantable cardioverter       defibrillator April 2006.  3. History of apical aneurysm.      a.     Treated with Coumadin.  4. Insulin-dependent diabetes mellitus.  5. Dyslipidemia.  6. History of lung nodule.   PLAN:  1. Given the current results of the OptiVol device, clearly suggesting      a progressive increase in  volume, recommendation is to resume the      previous diuretic regimen of Lasix 80 b.i.d. It may be that patient      will need to remain on this as her maintenance level in order to      keep her euvolemic.  2. Check a baseline BMET today as well as a BMP level and repeat BMET      in 1 week.  3. Schedule return followup with Dr. [name] in the [facility] in      approximately 4 weeks.  4. Check a fasting lipid profile and then make a determination as to      an appropriate cholesterol lowering agent.  5. Resume scheduled follow up with the device clinic, as scheduled.      The patient was last seen in the device clinic this past December.      [name], PA-C  Electronically Signed      [name], MD,FACC  Electronically Signed   GS/MedQ  DD: 07/13/2006  DT: 07/13/2006  Job #: [id number]   cc:   [name]

## 2010-11-12 NOTE — Assessment & Plan Note (Signed)
[facility] OFFICE NOTE   NAME:Levee, ELISHAH BEHEN                MRN:          [id number]  DATE:05/12/2006                            DOB:          07-28-1933    HISTORY OF PRESENT ILLNESS:  The patient is a 77 year old female with some  ischemic cardiomyopathy status CRTD.  The patient reported weight gain.  She  reports some increased shortness of breath.  She has gained 3 pounds since  her last office visit.  She denies any substernal chest pain.  We performed  a Cardiocyte monitor today, which clearly showed marked increase in the  OptiVol fluid index.   MEDICATIONS:  1. Aspirin 81 mg a day.  2. Benicar 10 mg p.o. q. day.  3. Glyburide 10 mg p.o. b.i.d.  4. Lexapro 5 mg p.o. q. day.  5. Nexium 40 mg a day.  6. Nitroglycerin 0.4 p.r.n.  7. Lantus insulin 55 units q.h.s.  8. Lasix 80 mg in the morning and 40 mg in the evening.  9. Aldactone 25 mg a day.  10.Coreg CR 10 mg p.o. q. day.   PHYSICAL EXAMINATION:  VITAL SIGNS:  Blood pressure is 100/64, heart rate 76  beats per minute.  Weight is 183 pounds.  NECK:  Normal carotid upstrokes.  No carotid bruits.  LUNGS:  Clear breath sounds bilaterally.  HEART:  Regular rate and rhythm, normal S1, S2.  ABDOMEN:  Soft.  EXTREMITIES:  No cyanosis, clubbing or edema.  NEURO:  Alert, oriented, grossly nonfocal.   PROBLEM:  1. Ischemic cardiomyopathy.  2. Status post out of hospital inferior wall myocardial infarction.  3. Status post stent to the  right coronary artery with residual moderate      non-obstructive coronary artery disease.  4. Ejection fraction of 20-25%.  5. Status post CRTD.  Revision of epicardial lead system.  6. History of lung nodule.  7. Dyspnea with now acute on-chronic systolic heart failure as documented      by OptiVol.   PLAN:  1. The patient's Lasix has been increased to 80 mg b.i.d. x 7 days, and      she can come  back to her usual dose.  2. The patient will follow up in the device clinic, as she has not had a      CRTD interrogation yet.  3. The patient will also follow up in our heart failure clinic for      management of her chronic congestive heart failure.     [name], MD,FACC  Electronically Signed    GED/MedQ  DD: 05/12/2006  DT: 05/12/2006  Job #: [id number]

## 2010-11-12 NOTE — Assessment & Plan Note (Signed)
[facility] OFFICE NOTE   NAME:[name], Shirley CARUCCI                MRN:          [id number]  DATE:02/03/2006                            DOB:          Oct 03, 1933    HISTORY OF PRESENT ILLNESS:  The patient is a 77 year old female with a  history of ischemic cardiomyopathy, status post implantable cardioverter  defibrillator implantation and CRT.  The patient presents for followup.  The  patient remains an [place] Class II-B/III.  She does report shortness of  breath on minimal exertion. During her last office visit, we had to cut her  Lasix as she was complaining of increased symptoms of dizziness.  The  patient had a repeat OptiVol interrogation done in the office today to make  sure that she does not develop increased volume overload.  The readings were  rather interesting and it appears that as of July 6, the patient had a  gradual increase in her OptiVol index with a concomitant decrease in  thoracic impedance suggestive of increasing extravascular lung water.  She  also reports some increase in dyspnea.   MEDICATIONS:  1. Aspirin 81 mg a day.  2. Coreg 3.125 mg p.o. b.i.d.  3. Benicar 10 mg a day.  4. Imdur 30 mg a day.  5. Glyburide 10 mg p.o. b.i.d.  6. Lexapro 10 mg a day.  7. Nexium 40 mg a day.  8. Nitroglycerin.  9. Lasix in the morning 80 mg.  10.Tramadol as well as colchicine.   PHYSICAL EXAMINATION:  VITAL SIGNS:  Blood pressure 100/60, heart rate 78  beats per minute.  NECK:  Normal carotid upstroke with jugular venous distension of  approximately 10 cm.  LUNGS:  Clear bilaterally with very fine bibasilar crackles.  HEART:  Regular rate and rhythm.  Normal sinus.  ABDOMEN:  Soft.  EXTREMITIES:  No edema.   PROBLEM LIST:  1. Ischemic cardiomyopathy.  2. Status post out of hospital inferior wall myocardial infarction.  3. Status post stent to the right coronary artery with residual  moderate,      nonobstructive coronary artery disease.  4. Ejection fraction 25%.  5. Status post implantable cardioverter defibrillator and CRT with      revision of epicardial lead system.  6. History of lung nodule.  7. Dyspnea.      a.     Status post evaluation by Dr. Koleen Nimrod with abnormal pulmonary       function tests.      b.     OptiVol interrogation today demonstrated increase in       extravascular lung water.      c.     Component of underlying obstructive pulmonary disease.   PLAN:  1. We have told [name] to increase her Lasix from 80 to 80 mg in      the morning and 40 mg in the evening.  This is based on the OptiVol      data obtained in the office today.  2. I also changed her Coreg to Coreg CR 10 mg a day as the patient run  out      of her Coreg and requested samples today in the office.  3. Apparently laboratory work was done by Dr. Scotty Court.  Will try to obtain      this __________.  4. The patient will follow up with [place] in three months.                                   [name], MD, [facility]   GED/MedQ  DD:  02/03/2006  DT:  02/03/2006  Job #:  [id number]

## 2010-11-12 NOTE — Cardiovascular Report (Signed)
NAMEHARMANI, KOWALL         ACCOUNT NO.:  [id number]   MEDICAL RECORD NO.:  [id number]          PATIENT TYPE:  INP   LOCATION:  2899                         FACILITY:  [facility]   PHYSICIAN:  Ethelle Lyon, M.D. LHCDATE OF BIRTH:  [birth year]   DATE OF PROCEDURE:  08/09/2004  DATE OF DISCHARGE:                              CARDIAC CATHETERIZATION   PROCEDURE:  Left heart catheterization, left ventriculography, coronary  angiography.   INDICATION:  Ms. Fritze is a 77 year old lady who recently underwent out-  of-hospital inferior myocardial infarction.  She has subsequently had  significant exertional dyspnea.  Dr. [name] felt this may represent an  anginal equivalent.  She is referred for diagnostic angiography.   PROCEDURAL TECHNIQUE:  Informed consent was obtained.  Under 1% lidocaine  local anesthesia, a 4 French sheath was placed in the right common femoral  artery using the modified Seldinger technique.  Diagnostic angiography and  ventriculography were performed using JL-4, JR-4, and pigtail catheters.  The patient tolerated the procedure well and was transferred to the holding  room in stable condition.   COMPLICATIONS:  None.   FINDINGS:  1.  LV:  106/4/14. EF approximately 40% with posterobasal hypokinesis and an      apical aneurysm.  2.  No aortic stenosis or mitral regurgitation.  3.  Left main:  Angiographically normal.  4.  LAD:  Large vessel giving rise to two diagonals.  The LAD has a 30%      stenosis in its mid section.  Each diagonal has an 80% ostial stenosis.  5.  Circumflex:  Moderate size vessel giving rise to two obtuse marginals.      There is a 30% stenosis of the mid vessel.  6.  RCA:  Large, dominant vessel.  There is a 90% stenosis of the mid vessel      with a heavy thrombus burden.  There is a then a 20-30% stenosis      downstream.   IMPRESSION/RECOMMENDATIONS:  Ms. Escovar has suffered a large inferior  myocardial infarction with  resultant apical aneurysm.  Clearly, her symptoms  may simply be due to LV systolic dysfunction.  However, her dyspnea with  minimal exertion in the setting of an left ventricular end-diastolic  pressure of only 14 certainly raises  concern that this represents an anginal equivalent, particularly given the  absence of chest discomfort with her infarction.  I therefore recommended  percutaneous revascularization of the RCA.  Given the large thrombus, I will  admit her to hospital and proceed with this this afternoon.  Sheath will  therefore be left in place.      WED/MEDQ  D:  08/09/2004  T:  08/09/2004  Job:  [id number]   cc:   [name]  [address]  [place]  [place] [address]  Fax: [phone]   [name], M.D. [facility]

## 2010-11-12 NOTE — H&P (Signed)
NAME:  [name], [name]         ACCOUNT NO.:  [id number]   MEDICAL RECORD NO.:  [id number]          PATIENT TYPE:  INP   LOCATION:  [id number]                         FACILITY:  [facility]   PHYSICIAN:  Thomas C. Wall, M.D.   DATE OF BIRTH:  1933-08-27   DATE OF ADMISSION:  02/11/2005  DATE OF DISCHARGE:                                HISTORY & PHYSICAL   CHIEF COMPLAINT:  Increased shortness of breath with minimal activity over  the past 3 days.   HISTORY OF PRESENT ILLNESS:  [name] is a 77 year old white female  with the following cardiovascular issues:  History of coronary artery  disease status post out-of-hospital inferior-posterior and septal MI  probably January 2006.  EF 25%.  She is status post elective CYPHER stent to  the right coronary artery for a 95% lesion with thrombus February 2006.  She  is also status post ICD and biventricular pacer placement by Dr. Jolyn Nap  October 18, 2004.   She has had continued issues with dyspnea on exertion and two-pillow  orthopnea.  She never has peripheral edema.   She recently had her Coreg reduced and had her potassium discontinued  because of difficulties with hyperkalemia and also questionable problems  with her blood pressure.  I do not have records from the [place] office.   Over the last 3 days she has become increasingly short of breath with  dyspnea on exertion.   PAST MEDICAL HISTORY:  She is type 2 diabetic, she has a history of  hypertension, she has Class III congestive failure, gastroesophageal reflux  and hiatal hernia, history of depression.  She has had a cholecystectomy and  hysterectomy.   CURRENT MEDICATIONS:  1.  Plavix 75 mg a day.  2.  Aspirin 325 a day.  3.  Actos 30 mg a day.  4.  Glyburide 5 mg p.o. b.i.d.  5.  Benicar 10 mg a day.  6.  Lexapro 10 mg a day.  7.  Lasix 40 mg b.i.d.  8.  Potassium is now being held.  9.  Coreg 3.125 b.i.d. as best I can tell.  10. Nexium 40 mg a day.  11. Isosorbide  30 mg a day  12. Nitrostat p.r.n.   ALLERGIES:  She is allergic to SULFA.   SOCIAL HISTORY:  She is married.  She quit smoking about 7 months ago.  She  does not use alcohol.  She lives in [place].  Her primary care physician is Dr.  Scotty Court.   FAMILY HISTORY:  Please see previous H&Ps.   REVIEW OF SYSTEMS:  No history of fever, chills, sweats, cough, hemoptysis,  melena, hematochezia, or hematemesis.  She denies any syncope or presyncope.  She has had some chest tightness in the emergency department this evening.   EXAMINATION:  GENERAL:  She is in no acute distress.  SKIN:  Skin is warm and dry.  VITAL SIGNS:  Blood pressure is 107/63, her pulse is 69 and regular, her  respiratory rate is 20, temperature is 98.3, her saturation is 96% on room  air.  HEAD AND NECK:  Extraocular movements intact.  PERRLA.  Sclerae are clear.  Ears are clear.  Teeth are normal.  Carotid upstrokes are equal bilaterally  without bruits.  No JVD.  Thyroid is not enlarged.  CHEST:  Lungs reveal a few crackles in the distal bases but not overly  impressive.  Heart reveals a regular rate and rhythm without an S3 gallop.  ABDOMEN:  Abdominal exam is obese with good bowel sounds.  There is no  tenderness.  There is no hepatomegaly.  EXTREMITIES:  Extremities reveal no edema whatsoever.  Pulses are intact.  NEUROLOGIC:  Exam is grossly intact.   Because she is lying in the hallway at [facility] and there is no beds in the ED  to examine her further, there is no EKG, there is no chest x-ray and there  is no blood work back yet.  I have put a call in to the [facility] administrative  people to find [place] more beds if possible.   ASSESSMENT:  1.  Acute systolic on chronic systolic heart failure.  This is secondary to      an ischemic cardiomyopathy with an ejection fraction of 20-25%.  The      chest tightness is probably from heart failure but we will rule her out      for an infarct as well.  2.  Other problems as listed  above.   PLAN:  1.  Admit to telemetry.  2.  CPK with MB q.8 h. x3.  We will not check troponin's since they will      most likely be falsely positive.  3.  Chest baseline labs including a BNP.  4.  Chest x-ray.  5.  Diuresis with Lasix 40 mg IV q.8 h.  6.  Interrogate pacer on Monday with echo directed optimal pacing intervals.  7.  Input from Dr. [name] and Dr. Caryl Comes on Monday.  8.  Hold Actos for now with her history of chronic and active heart failure.      This is probably not a good drug her going forward.  9.  Question add Hydralazine pressure allows.      Thomas C. Wall, M.D.  Electronically Signed     TCW/MEDQ  D:  02/11/2005  T:  02/11/2005  Job:  [id number]   cc:   [name], M.D. [facility]  [address]  [address]  [place]  [place] [address]   Deboraha Sprang, M.D.  [address]  [place] [address]   David Tapper  [address]  [place]  [place] [address]  Fax: [phone]

## 2010-11-12 NOTE — Cardiovascular Report (Signed)
[name], [name]         ACCOUNT NO.:  [id number]   MEDICAL RECORD NO.:  [id number]          PATIENT TYPE:  INP   LOCATION:  [id number]                         FACILITY:  [facility]   PHYSICIAN:  Jenkins Rouge, M.D.     DATE OF BIRTH:  1934-04-09   DATE OF PROCEDURE:  DATE OF DISCHARGE:                              CARDIAC CATHETERIZATION   PROCEDURE:  Coronary arteriography.   INDICATIONS:  Known ischemic cardiomyopathy increasing shortness of breath.   Cine catheterization is done from the right femoral artery and vein.   Left main coronary artery had 20% discrete stenosis.   Left anterior descending artery had 50% disease in the mid vessel. This was  between two diagonal branches.  Just after a small second diagonal branch,  was a 50-60% calcified lesion.  Distal vessel was normal size, diseased.   There was a large first diagonal branch with an 80% ostial lesion, second  diagonal branch was small with an 80% ostial lesion.   Circumflex coronary artery had 60% tubular disease at the takeoff of the  first obtuse marginal branch.   Right coronary artery was large and dominant. The stents in the mid vessel  were widely patent. There was 30% distal disease and 50% tubular disease in  the PLA.   RAO ventriculography:  RAO ventriculography was done in the steep RAO view  with 35 mL of contrast at 9 mL  per second. This was stopped __________  visualization of MR. No MR was seen.  The EF was in the 30% range. The  inferior wall was hypokinetic with the inferior apex and true apical segment  being dyskinetic   Right heart catheterization was done for the patient shortness of breath, LV  pressure is 121/29. Aortic pressure is 122/81. Pulmonary capillary wedge  pressure mean was 15. PA pressure was 29/18.  RV pressure was 27/8, mean RA  pressure was 13 and cardiac output was 3.0 liters per minute by Fick.   IMPRESSION:  The patient's filling dynamics are not that bad.  Her  ejection  fraction is low but not in the 10% range. Her shortness of breath may be an  anginal equivalent.  The left anterior descending proper did not seem  critical. I think the best approach which I discussed with Dr. [name] was to  do a Myoview study to assess for infarction and viability as well as  ischemia in the anterior wall. If she has significant anterior wall  ischemia, it may be worthwhile to angioplasty the first diagonal branch and  mid to distal left anterior descending.           ______________________________  Jenkins Rouge, M.D.     PN/MEDQ  D:  02/15/2005  T:  02/15/2005  Job:  [id number]   cc:   [name], M.D. [facility]  [address] [place]  [place] [address]

## 2010-11-12 NOTE — H&P (Signed)
NAME:  HONG, VOGLER                     ACCOUNT NO.:  [id number]   MEDICAL RECORD NO.:  [id number]                   PATIENT TYPE:   LOCATION:                                       FACILITY:  [facility]   PHYSICIAN:  R. Garfield Cornea, M.D.              DATE OF BIRTH:  1934/06/03   DATE OF ADMISSION:  DATE OF DISCHARGE:                                HISTORY & PHYSICAL   REQUESTING PHYSICIAN:  Dr. [name].   REASON:  Questionable rectal lesion.   HISTORY OF PRESENT ILLNESS:  Ms. Carolus is a 77 year old Caucasian  female who has been followed by Dr. Bridgette Habermann for several years now.  She does have a history of IBS as well as chronic GERD.  She was last seen a  year ago; at that time, she was taking Lactinex and Librax for her IBS  symptoms which included abdominal cramping and diarrhea and she was doing  quite well.  She notes that about 3 months ago, she developed left lower  quadrant pain which she describes as knife-like as well as burning.  It  sometimes radiates to her back or right side of her abdomen.  She was seen  by Dr. Scotty Court and CT scan with contrast was performed on Nov 21, 2003.  She  was found to have slight fatty infiltration of the liver, a small simple  cyst on her right kidney and they also noted a rounded contour of the soft  tissue on the lateral space of the rectum which was different than the left  at the level of the __________, measuring 2.9 x 2.8 cm in diameter, which  did not appear to be displacing the anus to the left.  She also underwent  colonoscopy by Dr. Gala Romney on August 30, 2001 for screening purposes, which was  normal, except for acute flexure noncompliance.  It was followed by air  contrast barium enema which revealed occasional diverticula in the sigmoid  colon but was otherwise normal.   She denies any fever or chills.  She denies any nausea or vomiting.  She  does report occasional loose stools, up to 5 times per day, that are  non-  watery, non-bloody and without any melena.  She does have a history of  cystocele.  She has had a hysterectomy and unilateral oophorectomy, however,  she does not know which ovary remains.  She has had multiple urinalyses  which were reportedly negative.   CURRENT MEDICATIONS:  1. Glyburide 5 mg 2 tablets daily.  2. Allegra-D p.r.n.  3. Lexapro 5 mg daily.  4. Nexium 40 mg daily.  5. Benicar 20 mg daily.  6. Metformin 500 mg b.i.d.  7. Flagyl 500 mg b.i.d.   PAST MEDICAL HISTORY:  1. Type 2 diabetes mellitus.  2. Hypertension.  3. GERD.  4. Chronic IBS.  5. H. pylori-positive in 1997.  6. COPD.  7. Hemoccult-positive stool on rectal exam,  Nov 19, 2003, at Dr. Rayna Sexton     office.   PHYSICAL EXAM:  VITAL SIGNS:  Weight 164 pounds.  Temperature 98.3.  Blood  pressure 138/60.  Pulse 74.  GENERAL:  Ms. Whitton is a 77 year old Caucasian female who is alert,  oriented, pleasant and cooperative, in no acute distress.  HEENT:  Sclerae clear and nonicteric.  Conjunctivae pink.  Oropharynx pink  and moist without any lesions.  NECK:  Neck supple with no masses or thyromegaly.  CHEST:  Heart regular rate and rhythm.  Normal S1 and S2 with no murmurs,  clicks, rubs or gallops.  ABDOMEN:  Positive bowel sounds x4.  Soft, nontender and nondistended  without any palpable mass or hepatosplenomegaly.  RECTAL:  Rectal was positive for a few very tiny external hemorrhoids, good  sphincter tone, no rectal masses or lesions palpated internally.  There was  a small amount of light-brown soft stool in the vault which was Hemoccult-  negative.  EXTREMITIES:  Good pulses bilaterally.  No edema.  SKIN:  Skin pink, warm and dry without any rash or jaundice.   ASSESSMENT:  Ms. Broshears is a 77 year old Caucasian female with  persistent left lower quadrant abdominal pain as well as intermittent loose  stools and diarrhea.  Findings on recent CT scan of a 2.9 x 2.0-cm rounded  contour are  somewhat suspicious, given her Hemoccult-positive stool at Dr.  Rayna Sexton office; however, she has had a recent colonoscopy in 2003 as well  as a barium enema, both of which were normal.  I discussed this case with  Dr. Bridgette Habermann and he recommends further evaluation with flexible  sigmoidoscopy to rule out rectal lesion.  Her symptoms may very well be  related to a low-lying diverticulitis that was not picked up on CT scan or  an exacerbation of her irritable bowel syndrome.   RECOMMENDATIONS:  1. We will scheduled flexible sigmoidoscopy in the near future by Dr. Gala Romney.     I have discussed this procedure including risks and benefits with Ms.     Belding.  2. If flexsig is negative, would recommend possible abdominal ultrasound.  3. She is to continue Nexium 40 mg daily or b.i.d., Rx was given for #60     with 2 refills.  4. Labs today to include CBC, sed rate and LFTs.  5. Further recommendations pending procedure.   We would like to thank Dr. Scotty Court for allowing [place] to participate in the care  of Ms. Malkiewicz.     _____________________________________  ___________________________________________  Les Pou, N.P.               Bridgette Habermann, M.D.   KC/MEDQ  D:  12/09/2003  T:  12/09/2003  Job:  [id number]   cc:   [name]  [address]  [place]  [place] [address]  Fax: [phone]   R. Garfield Cornea, M.D.  [address]  [place]  [place] [address]  Fax: [phone]

## 2010-11-12 NOTE — H&P (Signed)
NAMEIFRA, URIVE         ACCOUNT NO.:  [id number]   MEDICAL RECORD NO.:  [id number]          PATIENT TYPE:  INP   LOCATION:  2899                         FACILITY:  [facility]   PHYSICIAN:  Sueanne Margarita, P.A. DATE OF BIRTH:  1933-12-05   DATE OF ADMISSION:  10/18/2004  DATE OF DISCHARGE:                                HISTORY & PHYSICAL   PRESENTING CIRCUMSTANCE:  I'm here for a device placement.   HISTORY OF PRESENT ILLNESS:  Ms. Casel is a 77 year old female with a  history of diabetes extending several years back, hypertension, and most  importantly an acute inferior myocardial infarction in February of 2006 with  subsequent stent to the right coronary artery at that time.  Since that  time, she claims progressive fatigue and exercise intolerance, especially  the last two weeks.  She is significantly short of breath after going to and  from the mailbox which is a total distance of about 40 feet.  She is  discouraged at her performance level.  Prior to her myocardial infarction,  she had no history of fatigue or dyspnea intruding into her lifestyle.  She  now uses two pillows to sleep.  She has no paroxysmal nocturnal dyspnea, no  dependent edema, no palpitation, no history of syncope or presyncope.  Ejection fraction by echocardiogram was 20 to 25% in March of 2006.  This  was done as a follow-up from her acute myocardial infarction.   ALLERGIES:  SULFA.   MEDICATIONS:  Listed in the medication list.  1.  Plavix 75 mg daily.  2.  Actos 30 mg daily.  3.  Glyburide 10 mg twice daily.  4.  Benicar 20 mg daily.  5.  Lexapro 10 mg daily.  6.  Isosorbide 30 mg daily.  7.  Nexium 40 mg daily.  8.  Potassium 20 mEq twice daily.  9.  Atenolol 25 mg daily.  10. Lasix 40 mg daily.  11. Coumadin discontinued this in mid-April.   Note, this patient will change from atenolol to Coreg prior to this  discharge.   SOCIAL HISTORY:  The patient is married.  Once again  having quit tobacco  about 4 months ago.  No alcoholic beverages.   PAST MEDICAL HISTORY:  1.  Diabetes.  2.  Hypertension.  3.  History of inferior myocardial infarction and out-of-hospital attack in      February of 2006.  4.  Class III congestive heart failure symptoms, declining ejection      fraction, echocardiogram in March of 2006 with 20 to 25% depression.  5.  GERD with hiatal hernia.  6.  Dyslipidemia.   REASON FOR ADMISSION:  [name], M.D.   PAST SURGICAL HISTORY:  Status post cholecystectomy and hysterectomy.   PHYSICAL EXAMINATION:  GENERAL:  No acute distress currently.  VITAL SIGNS:  Temperature 97.8, blood pressure 109/56, pulse 58,  respirations 18, no complaints of chest pain or abdominal pain.  HEENT:  Normocephalic and atraumatic.  Eyes pupils equal, round, and  reactive to light.  Extraocular movements intact.  Sclerae clear.  No nasal  discharge.  NECK:  Supple, no carotid  bruits auscultated, no thyromegaly.  LUNGS:  Bilateral crackles at the bases with wheezing left mid field.  HEART:  Slow, regular rhythm with an S4.  ABDOMEN:  Soft, nondistended.  Bowel sounds are present.  No  hepatosplenomegaly.  Abdominal aorta is nonpulsatile.  EXTREMITIES:  No evidence of cyanosis, clubbing, or edema.  She has palpable  pedal pulses bilaterally and palpable radial pulses bilaterally.  NEUROLOGY:  No neurologic deficits noted.  Alert and oriented x3.  Cranial  nerves II-XII grossly intact.   PLAN:  Bi-V ICD today.      GM/MEDQ  D:  10/18/2004  T:  10/18/2004  Job:  [id number]

## 2010-11-12 NOTE — Op Note (Signed)
NAME:  [name], [name]                   ACCOUNT NO.:  [id number]   MEDICAL RECORD NO.:  [id number]                   PATIENT TYPE:  AMB   LOCATION:  DAY                                  FACILITY:  [facility]   PHYSICIAN:  R. Garfield Cornea, M.D.              DATE OF BIRTH:  01/19/1934   DATE OF PROCEDURE:  12/12/2003  DATE OF DISCHARGE:                                 OPERATIVE REPORT   PROCEDURE:  Sigmoidoscopy, diagnostic.   ENDOSCOPIST:  Cristopher Estimable. Rourk, M.D.   INDICATIONS FOR PROCEDURE:  The patient is [name] 77 year old lady with left  lower quadrant abdominal pain for several months. She has been extensively  evaluated by Dr. [name] and Dr. Michela Pitcher. Seen by Dr. Barrie Dunker over in  [place].  In 2003 she had an incomplete colonoscopy as well as an air contrast  barium enema which demonstrated only [name] couple of left-sided diverticula.  Recent CT scan demonstrated [name] questionable mass/asymmetry of the rectum. She  comes for sigmoidoscopy.  It is notable that she really has not had any  significant problems with this left lower quadrant abdominal pain and very  little pressure to the abdomen, in this area, causes pain.  She has  difficulty leaning at the sink, doing dishes etcetera.  Her abdomen is soft  and she does have almost abdominal wall tenderness to light palpation. No  mass appreciated.  It is notable she never developed [name] rash in this area.  Please see my H&P for more information.   PROCEDURE NOTE:  Patient placed in the left lateral decubitus position.  No  IV conscious sedation was given.   INSTRUMENT:  Olympus video chip system.   FINDINGS:  [name] digital rectal exam revealed no abnormalities.   ENDOSCOPIC FINDINGS:  The prep was marginal.  There was quite [name] bit of semi-  formed stool in the rectal vault; however, I was able to wash and suction it  away.  I got [name] good look at the rectum including [name] retroflex view of the  anal verge.  The mucosa appeared normal.  The scope was  advanced to 25 cm  (distal sigmoid).  The distal sigmoid mucosa appeared normal.   The patient tolerated the procedure well and was discharged.   IMPRESSION:  Endoscopically normal appearing rectum and distal sigmoid.   RECOMMENDATIONS:  Etiologically this left-sided abdominal pain remains  obscured at this time. She has been extensively evaluated.  This may be  neuropathic pain, or it is possible she may have something occult like [name]  spigelian hernia.  Ms. Petrovich is frustrated.  I thought that it might be  [name] good idea for her to see Dr. Lindalou Hose, once again, and let him examine  the abdominal wall and get his take on what is causing her pain.  Will mange  an appointment in the near future.      ___________________________________________  Bridgette Habermann, M.D.   RMR/MEDQ  D:  12/12/2003  T:  12/13/2003  Job:  [id number]   cc:   [name]  [address]  [place]  [place] [address]  Fax: [phone]   R. Garfield Cornea, M.D.  [address]  [place] [address]  Fax: [phone]   Marissa Nestle, M.D.  [place]  [place] [address]  Fax: [place]  [address]  [place]  [place] [address]  Fax: [phone]

## 2010-11-12 NOTE — Op Note (Signed)
[name], [name]         ACCOUNT NO.:  [id number]   MEDICAL RECORD NO.:  [id number]          PATIENT TYPE:  INP   LOCATION:  6523                         FACILITY:  [facility]   PHYSICIAN:  Deboraha Sprang, M.D.  DATE OF BIRTH:  July 13, 1933   DATE OF PROCEDURE:  10/18/2004  DATE OF DISCHARGE:                                 OPERATIVE REPORT   PREOPERATIVE DIAGNOSIS:  Ischemic cardiomyopathy with class 3 congestive  failure, narrow QRS, markedly abnormal tissue Doppler imaging for  dyssynchrony.   POSTOPERATIVE DIAGNOSIS:  Ischemic cardiomyopathy with class 3 congestive  failure, narrow QRS, markedly abnormal tissue Doppler imaging for  dyssynchrony.   OPERATION PERFORMED:  Dual chamber defibrillator implantation with  intraoperative defibrillation threshold testing with left ventricular lead  placement.   DESCRIPTION OF PROCEDURE:  Following the obtaining of informed consent, the  patient was brought to the electrophysiology laboratory and placed on the  fluoroscopic table in supine position.  After routine prep and drape of the  left upper chest, lidocaine was infiltrated in the prepectoral subclavicular  region and an incision was made and carried down to the layer of the  prepectoral fascia using electrocautery and sharp dissection.  A pocket was  formed similarly.  Hemostasis was obtained.  Thereafter attention was turned  to gaining access to the extrathoracic left subclavian vein which was then  accomplished without difficulty, without the aspiration of air or puncture  of the artery.  Three separate venipunctures were accomplished.  The  guidewire was replaced and retained.  Over the middle guidewire a 7 French  sheath was placed through which was then passed a Medtronic 6949 65 cm dual  coil active fixation defibrillator lead, serial number [id number].  Under  fluoroscopic guidance, it was manipulated to the right ventricular apex  where it was subsequently removed  and returned to where the final location  was 9.78 mV with pacing impedance 1000 ohms and threshold of 0.6 V and 0.5  msec with current at threshold of 0.7 mA (see below).   At this point attention was turned to gaining access to the coronary which  was accomplished with little difficulty.  Venography demonstrated a very  serendipitously planted LV branch in the lateral wall.  We were easily able  to cannulate over the wire, placed a Medtronic lead 4194 88 cm serial #[id number] into this vein and we mapped it extensively and we were unable to  pace from any portion of it except from this very proximal portion.  Given  the construction of the Medtronic lead with its double camber, I was  concerned that it would not hold in position.  We then tried using a Guidant  EZ track lead to see if we could push it into the very distal ramification  and capture adequately.  This unfortunately was associated with threshold of  greater than 7 V.   On venography we had demonstrated a bail out branch branching off the middle  cardiac vein.  We were able to cannulate this middle cardiac vein.  Venogram  was accomplished. We passed the wire and then passed the Medtronic  lead into  this position.  In its most distal ramification, it actually came up around  the apex onto the lateral wall.  However, pacing was not possible in this  location.  We ended up having to bring it back to a midposition between base  and apex and in its final location the LV amplitude was 2.5 mV with a pacing  impedance of 884 ohms, a threshold of 4 V at 0.8 msec and current at  threshold of 6.6 mA. There was no diaphragmatic pacing at 10 V.  While the  lead was in these positions, we then took the RV lead, repositioned it, put  it up on the RV septum to see if we could enhance our V1-V2 interval.  It  was at 30 to 40 msec when we made the RV lead up to the RVOT that delta  decreased to 5 msec.  We then elected to put the lead back  down on the RV  floor where the V1-V2 interval returned to about 40 msec.  At this point the  atrial lead was inserted through the last remaining guidewire.  A Medtronic  5076 52 cm active fixation lead serial [id number] was passed under  fluoroscopic guidance and initially the right atrial appendage where the  bipolar P-wave was 3.5 mV with a pacing impedance of 695 ohms and threshold  of 1.6 V at 0.5 msec.  Current at threshold was 2.9 mA.  With these  acceptable parameters recorded, the leads were secured to the [place] ICD serial number [id number].  Through the device a  bipolar P-wave was 3.2 mV with a pacing impedance of 536 ohms, threshold of  1 V at 0.4 msec.  The R-wave was 9.7 mV with a pacing impedance of 856 ohms  and threshold 1 V at 0.1 msec and the LV impedance was 528 ohms with a  threshold of 4 V at 0.7 msec.  The proximal coil impedance was 58, the  distal coil impedance was 48.  With these acceptable parameters recorded,  defibrillation threshold testing was undertaken.  Ventricular fibrillation  was induced via T-wave shock. After a total duration of 6 seconds, a 20  joule shock was delivered through a measured resistance of 45 ohms  terminating ventricular fibrillation and restoring sinus rhythm.  After a  wait of 5 to 6 minutes, ventricular fibrillation was reinduced via the T-  wave shock.  After a total duration of 5.5 seconds a 15 joule shock was  delivered through a measured resistance of 46 ohms terminating ventricular  fibrillation and restoring sinus rhythm.  At this point the device was  implanted.  The pocket was copiously irrigated with antibiotic containing  saline solution.  Hemostasis was assured and the leads and the pulse  generator were placed in the pocket and secured to the prepectoral fascia.  The wound was closed in three layers in normal fashion.  The wound was washed, dried and Benzoin and Steri-Strip dressing was applied.   Sponge,  needle and instrument counts were correct at the end of the procedure  according to the staff.  The patient tolerated the procedure without  apparent complication.      SCK/MEDQ  D:  10/18/2004  T:  10/18/2004  Job:  [id number]   cc:   [facility]   Electrophysiology lab   [address]  [place]  [place] [address]  Fax: [phone]

## 2010-11-12 NOTE — Discharge Summary (Signed)
NAMESAREN, HOUDESHELL         ACCOUNT NO.:  [id number]   MEDICAL RECORD NO.:  [id number]          PATIENT TYPE:  INP   LOCATION:  6523                         FACILITY:  [facility]   PHYSICIAN:  Sueanne Margarita, P.A. DATE OF BIRTH:  [birth year]   DATE OF ADMISSION:  10/18/2004  DATE OF DISCHARGE:  10/19/2004                                 DISCHARGE SUMMARY   DISCHARGE DIAGNOSES:  1.  Discharging day #1 status post implantation of Medtronic cardioverted      defibrillator which is an [place] with left ventricular lead      placement.  2.  Ischemic cardiomyopathy ejection fraction 25%.  3.  History of coronary artery disease, history of inferior myocardial      infarction, status post stent to the right coronary artery February      2006.   SECONDARY DIAGNOSES:  1.  Diabetes mellitus.  2.  Hypertension.  3.  Class III congestive heart failure symptoms.  4.  Gastroesophageal reflux disease and hiatal hernia.  5.  Depression.  6.  Status post cholecystectomy/hysterectomy.   PROCEDURES:  October 18, 2004, implantation of Medtronic cardioverted  defibrillator with left ventricular lead placement by Dr. Virl Axe.  The  patient has had some postoperative nausea which has now resolved on  postoperative day #1.  Her vital signs are stable.  On day of discharge, she  is achieving 97% oxygen saturation with 2 liters.  Her incision has been  inspected and is healing nicely.  There is very mild swelling at the pocket  site with no erythema, healing nicely.  The chest x-ray has been viewed and  shows leads in appropriate placement.  The device has been interrogated with  no ventricular tachycardia, no ventricular fibrillation episodes.  No  changes made.   DISCHARGE MEDICATIONS:  1.  Plavix 75 mg daily.  2.  Enteric coated aspirin 325 mg daily.  3.  Actos 30 mg daily.  4.  Glyburide 5 mg bid  5.  Benicar 10 mg daily.  6.  Lexapro 10 mg daily.  7.  Lasix 40 mg daily.  8.   Potassium chloride 20 mEq b.i.d.  9.  Coreg 3.125 mg b.i.d.  10. Nexium 40 mg daily.  11. Isosorbide 30 mg daily.  12. Albuterol metered-dose inhaler two puffs q.4h. p.r.n.  13. The patient also goes home on Nitrostat 0.4 mg one tablet under the      tongue every 5 minutes x3 doses as needed for chest pain.  14. For pain management, Darvocet N-100 1-2 tablets q.3-4h. p.r.n. pain.   DISCHARGE INSTRUCTIONS:  1.  Activity:  The patient's activity has been discussed, specifically,      movement of the left upper extremity over the next seven days.  2.  Diet:  Low-salt, low-cholesterol, diabetic diet.  3.  Wound Care:  The patient is to keep her incision dry for the next seven      days, sponge bathe until Monday, May 1.   FOLLOWUP:  Follow up at [facility], [address] [facility] on May 10 at 9:15 a.m.  She will  see Dr. Caryl Comes July 10 at 4 p.m.  She  will also have an appointment with Dr. [name].  This will be made prior to  her discharge.   BRIEF HISTORY:  The patient is a 77 year old female with history of diabetes  over several years, hypertension.  She has had a recent inferior myocardial  infarction February 2006, and had stent to the right coronary artery at that  time.  The patient complains of progressive fatigue and exercise intolerance  since her myocardial infarction in February, especially during the last two  weeks.  She is significantly short of breath going to and from her mailbox  which is a total distance of about 40 feet.  She was discouraged at her  performance level.  Prior to her myocardial infarction, she had no symptoms  referent to fatigue or dyspnea.  The patient now needs two pillows at night.  She does not have paroxysmal nocturnal dyspnea, no edema, no palpitation, no  presyncope or syncope.  The patient has been seen by Dr. [name] and has had  a workup for cardiac dyssynchrony with occasion left ventricular lead  placement at the time of  cardioverted defibrillator implantation.  She also  had an MRI lately at [facility] to image for old cerebral infarct.  This study  is not currently available.  In addition, the results of the tissue Doppler  study which were taken prior to this procedure are not available.   HOSPITAL COURSE:  The patient presents April 24.  She is not having any  particular symptoms other than her shortness of breath.  No chest pain.  No  presyncope or syncope.  The patient underwent successful implantation of  cardioverted defibrillator with left ventricular lead placement of Dr.  Caryl Comes.  She had mild nausea post procedurally which has now resolved.  Patient discharging post procedure day #1 with the medications in follow up  as dictated.      GM/MEDQ  D:  10/19/2004  T:  10/19/2004  Job:  [id number]   cc:   Deboraha Sprang, M.D.   [name], M.D. [facility]

## 2010-11-12 NOTE — Discharge Summary (Signed)
[name], [name]         ACCOUNT NO.:  [id number]   MEDICAL RECORD NO.:  [id number]          PATIENT TYPE:  INP   LOCATION:  [id number]                         FACILITY:  [facility]   PHYSICIAN:  [name], M.D. LHCDATE OF BIRTH:  07-Apr-1934   DATE OF ADMISSION:  08/09/2004  DATE OF DISCHARGE:  08/10/2004                           DISCHARGE SUMMARY - REFERRING   PROCEDURE:  Coronary angiogram/Cypher stenting right coronary artery on  August 09, 2004.   REASON FOR ADMISSION:  Please refer to Dr. Arlina Robes admission note for full  details.   LABORATORY DATA AND X-RAY FINDINGS:  Hematocrit 36, platelets 208 at  discharge.  Potassium 4.1, BUN 17, creatinine 0.8 at discharge.  CPK/MB 2.2  at discharge.  INR 1.1 at discharge.  Lipid profile with total cholesterol  205, triglycerides 174, HDL 36, LDL 134.   HOSPITAL COURSE:  The patient was presented for elective cardiac  catheterization performed by Dr. Sabino Snipes (see report for full  details) revealing severe right coronary artery stenosis with a 90% proximal  lesion and large thrombus.  Left ventricular function was moderate depressed  (EF 40%) with posterobasal hypokinesis/apical aneurysm.   The patient underwent successful Cypher stenting of the 90% mid right  coronary artery lesion employing filter wire distal protection.  There were  no nodal complications.   Recommendation is to treat with Plavix for at least 1 year, aspirin  indefinitely and add Coumadin for apical aneurysm.  The patient was also  started on Lipitor prior to discharge.   The patient was kept for overnight observation and cleared for discharge the  following morning in hemodynamically stable condition.  There was a small  hematoma overlying the right groin with no bruit on auscultation.   Prior to discharge, the patient was referred to case management for  medication assistance.  We will supply her with Vytorin (10/20) samples from  our [place]  office.   DISCHARGE MEDICATIONS:  1.  Plavix 75 mg q.d.  2.  Coated aspirin 325 mg q.d.  3.  Coumadin 5 mg q.d.  4.  Vytorin 10/20 mg q.d.  5.  Actos 30 mg q.d.  6.  Glyburide 5 mg b.i.d.  7.  Benicar 10 mg q.d.  8.  Lexapro 10 mg q.d.  9.  Lasix 40 mg q.d.  10. K-Dur 20 mEq b.i.d.  11. Atenolol 25 mg q.d.  12. Nexium 40 mg q.d.  13. Nitrostat 0.4 mg p.r.n.   ACTIVITY:  No heavy lifting/driving x2 days.   DIET:  Maintain low fat, low cholesterol diet.   SPECIAL INSTRUCTIONS:  Call the office if there is any swelling or bleeding  of the groin.  Check protime on Thursday, February 16, at 9:30 a.m. at [facility].   FOLLOW UP:  Follow up with Dr. [name] on Wednesday, March 1, at 12:30 p.m.  at the [facility].   DISCHARGE DIAGNOSES:  1.  Ischemic cardiomyopathy/severe single-vessel coronary artery disease.      1.  Status post elective Cypher stenting 90% mid right coronary artery          on August 09, 2004.  2.  Status post recent out of hospital myocardial infarction/complicated          by congestive heart failure.      3.  Ejection fraction 40%, posterobasal hypokinesis/apical aneurysm.  2.  Apical aneurysm with Coumadin anticoagulation initiated.  3.  Type 2 diabetes mellitus.  4.  Dyslipidemia.   Addendum:will obtain repeat 2DECHO in 4- 6 weeks. If EF 40-45% or higher  then will obtain SAECG/TWA and possible EPS. If EF < 35% will likley proceed  with AICD.The patient is at high risk for SCD probably independent of EF.I  have discussed this with dr. Caryl Comes and we made an apointment for the patient  when we are both in the [place] office.  Salli Real, MD, [facility]      GS/MEDQ  D:  08/10/2004  T:  08/10/2004  Job:  [id number]   cc:   [name]  [address]  [place]  [place] [address]  Fax: [phone]

## 2010-11-12 NOTE — Discharge Summary (Signed)
NAME:  [name], [name]         ACCOUNT NO.:  [id number]   MEDICAL RECORD NO.:  [id number]          PATIENT TYPE:  INP   LOCATION:  [id number]                         FACILITY:  [facility]   PHYSICIAN:  Satira Sark, M.D. LHCDATE OF BIRTH:  12-07-1933   DATE OF ADMISSION:  02/11/2005  DATE OF DISCHARGE:  02/17/2005                                 DISCHARGE SUMMARY   BRIEF HISTORY:  Ms. [name] is a 77 year old white female who presents  with increased shortness of breath associated with minimal activity over the  preceding three days.  She denies any peripheral edema.  Recently she has  had some problems with hypotension and her Coreg was reduced, and also  problems with hypokalemia and her potassium discontinued.   Her history is notable for type 2 diabetes, hypertension, class III  congestive heart failure, GERD, hiatal hernia, depression, cholecystectomy,  hysterectomy; out of the hospital inferior, posterior and septal myocardial  infarction in January 2006 with an EF of 25%.  Status post elective Cypher  stent to the RCA for a 95% lesion in February 2006.  Status post ICD with  biventricular pacer in April 2006.   She is allergic to SULFA.   Remote tobacco use.   LABORATORY DATA:  Chest x-ray shows stable cardiomegaly without failure.  There was mild subsegmental atelectasis or scarring of the left base, low  lung volumes.  EKG showed low voltage, normal sinus rhythm, normal axis,  early R-wave, nonspecific ST-T wave changes as well as T-wave inversion in  V4 through V6 and inferior leads. Admission weight was 183.2, discharge  weight on the 24th was 179.3.  Admission H&H was 10.7 and 30.8, normal  indices, platelets 203, WBC 5.0.  H&H prior to discharge on August 23 was  10.9 and 31.1, normal indices, platelets 173, WBC 5.6.  Admission PTT 27, PT  12.9.  Sodium 139, potassium 3.4, BUN 21, creatinine 1.3, glucose 201.  Potassium on August 20 was 3.5 after supplementation,  on August 21 3.8.  BUN  and creatinine on August 21 was 29 and 1.5.  Chemistry prior to discharge  showed a sodium of 141, potassium 4.0, BUN 18, creatinine 1.3, glucose 147.  Hemoglobin A1c on February 15, 2005, was elevated at 7.3.  CK-MBs x3 were  negative for myocardial infarction.  Initial troponin was 0.02.  Initial BNP  was 261.8, on August 22 was 82.2, on August 23 121.9, and at the time of  discharge 132.8.  Iron on August 21 was 60, TIBC 307, percent saturation 20.  Vitamin B12 430, folate 434, ferritin 129.  Urinalysis on August 20 showed  many bacteria, positive nitrite, leukocytes.  Repeat urinalysis on August 21  continued to show many bacteria, however otherwise was normal.   HOSPITAL COURSE:  Ms. Kooi was admitted to 4700 by Dr. Verl Blalock for IV  diuresis.  Overnight she complained of weakness and it was difficult to  determine if her shortness of breath had improved.  Her potassium was noted  to be low, thus was supplemented.  Over the next several days she was  diuresed with potassium supplementation.  Physical therapy  noted that the  patient was independent and functional.  Case management also assisted with  any discharge needs.  Her weight gradually decreased.  Urinalysis was  performed and noted to be abnormal; thus, this was rechecked as previously  described.  Diabetes treatment team evaluation made some recommendations on  August 21 and recommended sliding scale.  They felt that Actos was  contraindicated for class III-IV heart failure.  Dr. [name] was considering  Aldactone prior to discharge and was concerned about low output and possible  ischemia with lack of AV optimization.  He felt that she should undergo  cardiac catheterization to rule out progressive coronary artery disease.  Catheterization was performed by Dr. Johnsie Cancel.  Please refer to dictated note.  The RCA was widely patent with a 30 and 50% distal RCA lesion, 20% left  main, 50% mid-LAD, 50% distal  LAD, 80% ostial to diagonal 1, and an 80%  ostial diagonal 2, 60% mid-circumflex at the OM takeoff.  EF was 30% with  inferior hypokinesis and inferoapical dyskinesis.  Findings were discussed  with Dr. [name].  ABIs were performed on August 23 and were felt to be  within normal limits.  Potassium continued to be supplemented during her  diuresis.  Nuclear medicine test did not show any signs of ischemia.  There  was a small fixed lesion in the midseptum.  Dr. Ron Parker could not rule out some  distal anterior ischemia but not marked.  He felt that the LAD or diagonal  lesion was not significant for ischemia and that she did not need a PCI.  The echo was technically difficult.  By August 24, her shortness of breath  had improved and it was felt that she could be discharged home.   DISCHARGE DIAGNOSES:  1.  Recurrent congestive heart failure.  2.  Ischemic cardiomyopathy with an left ventricular function of      approximately 25%, status post biventricular implantable cardioverter-      defibrillator.  3.  Hypertension.  4.  Hypokalemia.  5.  Type 2 diabetes with elevated hemoglobin A1c and hyperglycemia.  6.  Prior transient ischemic attack.  7.  Periapical left ventricular aneurysm.  8.  History as previously.   DISPOSITION:  She is discharged home, asked to maintain a low salt, fat and  cholesterol diet and to weigh, record her weights daily.  She was asked to  bring all medicines and all weights to all appointments.  She received  discharge instructions in regard to the catheterization site care and  activities.   Her new medications include:  1.  Coumadin 5 mg daily for the apical aneurysm.  2.  Her aspirin was decreased to 81 mg daily and her Coreg to 3.125 mg      b.i.d.   She was asked to continue:  1.  Benicar 10 mg daily.  2.  Lasix 40 mg b.i.d.  3.  Imdur 30 mg daily.  4.  Glyburide 5 mg b.i.d.  5.  Lexapro 10 mg daily.  6.  Nexium 40 mg daily. 7.  Nitroglycerin 0.4 mg  as needed.   She will follow up with Dr. [name] in the [facility] office on September 6 at 1  p.m.  She was asked to call Dr. Scotty Court for a two-week appointment to follow  up on her hyperglycemia and her poorly-controlled sugars.  She was asked not  to take her Plavix or Actos.  She will have a PT/INR on Monday at the [facility]  office at 8:30.  Someone will call her at home this afternoon with  arrangements for an outpatient echocardiogram for AV optimization.  When she  follows  up with Dr. [name], consideration should be given to checking a BMET to  reassess her potassium and kidney function.  Also, review of medical records  should be pursued in regard to her lipid status and if she should be on a  statin medication given her lipid status, coronary artery disease, diabetes.      Sharyl Nimrod, P.A. [facility]    ______________________________  Satira Sark, M.D. [facility]    EW/MEDQ  D:  02/17/2005  T:  02/17/2005  Job:  [id number]   cc:   [name]  [address]  [place]  [place] [address]  Fax: [phone]   [name], M.D.  [facility]  [place], [place] [address]   Deboraha Sprang, M.D.  [address]  [place] [address]

## 2010-11-12 NOTE — Assessment & Plan Note (Signed)
[place]                         ELECTROPHYSIOLOGY OFFICE NOTE   NAME:Carns, KYNADEE MCGLATHERY                MRN:          [id number]  DATE:06/12/2006                            DOB:          1933-09-10    HISTORY OF PRESENT ILLNESS:  Mrs. [name] is seen in device clinic  today in the [facility] office June 12, 2006, for routine follow up for  Medtronic Century 7299 dual-chamber biventricular defibrillator.  Of  note, she had not been seen for device follow up in over one year.  Upon  interrogation, battery voltage is 3.1 volts with last recorded charge  time of 7.74 seconds.  In the atrium, intrinsic amplitude is 3  millivolts with an impedence of 648 ohms and threshold of 1 volt at 0.2  milliseconds.  In the right ventricle, intrinsic amplitude is 12  millivolts with an impedence of 568 ohms and threshold of 1 volt at 0.3  milliseconds.  In the left ventricle, impedence of 416 with threshold of  1 volt at 0.1 milliseconds.  High voltage lead impedence of 64 ohms.  Rate response was turned on, and the lead was reprogrammed as per the  69/49 guidelines from Medtronic.  [name] does appear to be  short of breath with minimal exertion and at rest.  She states, however,  this is pretty much her normal state.  Her __________does show a slight  increase in fluid levels, but not enough to be concerned about at this  point.  She was strongly encouraged to track her weight and her symptoms  over the next week and contact Dr. [name] if he needs to see her earlier  than her scheduled January 17 appointment for her tenuous failure  status.  She will begin care [facility] transmission in 3, 6 and 9 months  and be seen by Dr. Caryl Comes in one years time.      Manus Rudd, RN  Electronically Signed      Deboraha Sprang, MD, [facility]  Electronically Signed   CF/MedQ  DD: 06/12/2006  DT: 06/12/2006  Job #: [id number]

## 2010-11-18 ENCOUNTER — Ambulatory Visit (INDEPENDENT_AMBULATORY_CARE_PROVIDER_SITE_OTHER): Payer: Medicare Other | Admitting: Internal Medicine

## 2010-11-18 ENCOUNTER — Encounter: Payer: Self-pay | Admitting: Internal Medicine

## 2010-11-18 DIAGNOSIS — I428 Other cardiomyopathies: Secondary | ICD-10-CM

## 2010-11-18 DIAGNOSIS — I5022 Chronic systolic (congestive) heart failure: Secondary | ICD-10-CM

## 2010-11-18 DIAGNOSIS — I509 Heart failure, unspecified: Secondary | ICD-10-CM

## 2010-11-18 DIAGNOSIS — I2589 Other forms of chronic ischemic heart disease: Secondary | ICD-10-CM

## 2010-11-18 NOTE — Assessment & Plan Note (Signed)
Stable No change required today  

## 2010-11-18 NOTE — Progress Notes (Signed)
The patient presents today for routine electrophysiology followup.  Since last being seen in our clinic, the patient reports doing very well. She recently lost her balance in her yard and injured her L wrist.  She is clear that this was not presyncope or syncope but rather unsteadiness. Today, she denies symptoms of palpitations, chest pain, shortness of breath, orthopnea, PND, lower extremity edema, dizziness, presyncope, syncope, or neurologic sequela.  The patient feels that she is tolerating medications without difficulties and is otherwise without complaint today.   Past Medical History  Diagnosis Date  . Diabetes mellitus     IDDM; July 2011 hemoglobin A1c 6.3  . Anxiety disorder   . GERD (gastroesophageal reflux disease)   . Hyperlipidemia     Statin Intolerant; Lipid panel July 2011 triglycerides 266 cholesterol 173 HDL 28 LDL 92 VLDL 53  . Ischemic cardiomyopathy     05/2009 Successful percutaneous intervention of RCA and LAD using nondrug-eluting platform; Moderate nonobstructive coronary artery disease by  catheterization in June 2008. Status post stenting of the RCA in 2006.Status post Medtronic biventricular implantable cardioverter-defibrillator implantation. S/P RV lead revision after multiple ICD shocks for sprint fidelis fracture   . Chronic systolic heart failure     Ejection fraction 45% by echo in June 2008.  . Impingement syndrome of left shoulder   . Aneurysm     Apical; previously on Coumadin  . Chronic renal insufficiency     Creatinine 1.17 January 2010  . Tobacco abuse     Discontinued  . Peripheral polyneuropathy     refractory to several medications   Past Surgical History  Procedure Date  . Abdominal hysterectomy   . Cardiac defibrillator placement 05/28/2009    Medtronic ICD placed by Dr. Caryl Comes secondary to ICM/CHF, [phone] Fidelis lead fracture with ICD storm 2010 requiring new RV lead placement    Current Outpatient Prescriptions  Medication Sig Dispense  Refill  . ALPRAZolam (XANAX) 0.5 MG tablet Take 1 tablet by mouth daily as needed.       Marland Kitchen aspirin 325 MG tablet Take 1 tablet by mouth daily.       . carvedilol (COREG) 6.25 MG tablet Take 1 tablet by mouth two times a day.       . citalopram (CELEXA) 20 MG tablet Take 1 tablet by mouth daily.       Marland Kitchen dicyclomine (BENTYL) 10 MG capsule Take 10 mg by mouth 2 (two) times daily.        . fexofenadine (ALLEGRA) 180 MG tablet Take 180 mg by mouth daily.        . furosemide (LASIX) 80 MG tablet Take 1 tablet (80 mg total) by mouth 2 (two) times daily.  60 tablet  6  . insulin glargine (LANTUS) 100 UNIT/ML injection Inject subcutaneous as directed.       Marland Kitchen omeprazole (PRILOSEC OTC) 20 MG tablet Take 1 tablet by mouth daily.       . potassium chloride SA (K-DUR,KLOR-CON) 20 MEQ tablet Take 1 tablet by mouth two times a day.       Marland Kitchen tiZANidine (ZANAFLEX) 2 MG tablet Take 2 mg by mouth every 8 (eight) hours as needed.        . traMADol (ULTRAM) 50 MG tablet Take 1-2 tablets by mouth every 4-6 hours as needed pain.       . vitamin B-12 (CYANOCOBALAMIN) 1000 MCG tablet Take 1 tablet by mouth daily.         Allergies  Allergen Reactions  . Metformin   . Sulfonamide Derivatives     History   Social History  . Marital Status: Married    Spouse Name: N/A    Number of Children: N/A  . Years of Education: N/A   Occupational History  . Retired    Social History Main Topics  . Smoking status: Former Smoker -- 0.8 packs/day for 30 years    Types: Cigarettes    Quit date: 06/27/2002  . Smokeless tobacco: Never Used  . Alcohol Use: No  . Drug Use: Not on file  . Sexually Active: Not on file   Other Topics Concern  . Not on file   Social History Narrative  . No narrative on file    Family History  Problem Relation Age of Onset  . Coronary artery disease Neg Hx    Physical Exam: Filed Vitals:   11/18/10 0922  BP: 97/60  Pulse: 64  Height: 5\' 6"  (1.676 m)  Weight: 173 lb (78.472 kg)     GEN- The patient is well appearing, alert and oriented x 3 today.   Head- normocephalic, atraumatic Eyes-  Sclera clear, conjunctiva pink Ears- hearing intact Oropharynx- clear Neck- supple, no JVP Lymph- no cervical lymphadenopathy Lungs- Clear to ausculation bilaterally, normal work of breathing Chest- ICD pocket is well healed Heart- Regular rate and rhythm, no murmurs, rubs or gallops, PMI not laterally displaced GI- soft, NT, ND, + BS Extremities- no clubbing, cyanosis, or edema MS- no significant deformity or atrophy Skin- no rash or lesion Psych- euthymic mood, full affect Neuro- strength and sensation are intact  ICD interrogation- reviewed in detail today,  See PACEART report  Assessment and Plan:

## 2010-11-18 NOTE — Assessment & Plan Note (Signed)
Normal BiV ICD function See Pace Art report I have prolonged NID today to avoid inappropriate ICD shocks. She remains very afraid after her prior ICD storm for 6949 lead fracture.  She requests a magnet.  Though her lead has been replaced and her device is functioning normally today, I think that this is reasonable for her peace of mind.  The appropriate and inappropriate use of a magnet were discussed in detail today.  We will mail her a magnet.

## 2011-01-27 ENCOUNTER — Encounter: Payer: Self-pay | Admitting: Cardiology

## 2011-02-14 ENCOUNTER — Ambulatory Visit (INDEPENDENT_AMBULATORY_CARE_PROVIDER_SITE_OTHER): Payer: Medicare Other | Admitting: Cardiology

## 2011-02-14 ENCOUNTER — Encounter: Payer: Self-pay | Admitting: Cardiology

## 2011-02-14 VITALS — BP 109/71 | HR 69 | Ht 66.0 in | Wt 173.0 lb

## 2011-02-14 DIAGNOSIS — E119 Type 2 diabetes mellitus without complications: Secondary | ICD-10-CM | POA: Insufficient documentation

## 2011-02-14 DIAGNOSIS — I2510 Atherosclerotic heart disease of native coronary artery without angina pectoris: Secondary | ICD-10-CM

## 2011-02-14 DIAGNOSIS — I2589 Other forms of chronic ischemic heart disease: Secondary | ICD-10-CM

## 2011-02-14 DIAGNOSIS — E1149 Type 2 diabetes mellitus with other diabetic neurological complication: Secondary | ICD-10-CM

## 2011-02-14 DIAGNOSIS — E1140 Type 2 diabetes mellitus with diabetic neuropathy, unspecified: Secondary | ICD-10-CM

## 2011-02-14 DIAGNOSIS — E1142 Type 2 diabetes mellitus with diabetic polyneuropathy: Secondary | ICD-10-CM

## 2011-02-14 NOTE — Patient Instructions (Signed)
Continue all current medications.  May increase Tramadol to 100mg  every 6 hours as needed for pain Your physician wants you to follow up in: 6 months.  You will receive a reminder letter in the mail one-two months in advance.  If you don't receive a letter, please call our office to schedule the follow up appointment

## 2011-02-14 NOTE — Assessment & Plan Note (Signed)
Ischemic cardiomyopathy: Stable no recurrent angina, stable shortness of breath. The patient actually able to walk up to staircase our practice was not particular short of breath. Continue current Lasix regimen.

## 2011-02-14 NOTE — Assessment & Plan Note (Signed)
Continue current medical regimen. The patient is on carvedilol. She's also on aspirin. She statin intolerance. She's currently not on an ACE inhibitor but her blood pressures below. Previously she had orthostatic hypotension with ACE inhibitor.

## 2011-02-14 NOTE — Progress Notes (Signed)
HPI The patient is an elderly female with history of ischemic cardiomyopathy, status post CRT-D. She has severe LV dysfunction with an ejection fraction 30-35%. She also has coronary artery disease with most recent catheterization in 2010 when she underwent stenting of the LAD with a bare-metal stent as well as a bare-metal stent to the right coronary artery. She's statin intolerance. From a cardiac standpoint the patient is stable. Unfortunately she has severe diabetic neuropathy, reports pain in the lower extremities as well as decreased sensation. She has a very unsteady gait and now spends most of her time inside the house. She was told by primary care physician to walk outside alone anymore. She feels very depressed. She has a difficult social situation, she was by herself, her son of liver transplantation and has some medical problems and her daughter-in-law has multiple sclerosis. Financial they are having a hard time. The patient does not appear to be volume overloaded. She reports no orthopnea PND. She is wondering if she can take a higher dose of pain medication because of ongoing lower extremity pain.  Allergies  Allergen Reactions  . Metformin   . Sulfonamide Derivatives     Current Outpatient Prescriptions on File Prior to Visit  Medication Sig Dispense Refill  . ALPRAZolam (XANAX) 0.5 MG tablet Take 1 tablet by mouth daily as needed.       Marland Kitchen aspirin 325 MG tablet Take 1 tablet by mouth daily.       . carvedilol (COREG) 6.25 MG tablet Take 1 tablet by mouth two times a day.       . citalopram (CELEXA) 20 MG tablet Take 1 tablet by mouth daily.       Marland Kitchen dicyclomine (BENTYL) 10 MG capsule Take 10 mg by mouth 2 (two) times daily as needed.       . fexofenadine (ALLEGRA) 180 MG tablet Take 180 mg by mouth daily.        . furosemide (LASIX) 80 MG tablet Take 1 tablet (80 mg total) by mouth 2 (two) times daily.  60 tablet  6  . insulin glargine (LANTUS) 100 UNIT/ML injection Inject  subcutaneous as directed.       Marland Kitchen omeprazole (PRILOSEC OTC) 20 MG tablet Take 1 tablet by mouth daily.       . potassium chloride SA (K-DUR,KLOR-CON) 20 MEQ tablet Take 1 tablet by mouth two times a day.       . traMADol (ULTRAM) 50 MG tablet Take 1-2 tablets by mouth every 4-6 hours as needed pain.       . vitamin B-12 (CYANOCOBALAMIN) 1000 MCG tablet Take 1 tablet by mouth daily.         Past Medical History  Diagnosis Date  . Diabetes mellitus     IDDM; July 2011 hemoglobin A1c 6.3  . Anxiety disorder   . GERD (gastroesophageal reflux disease)   . Hyperlipidemia     Statin Intolerant; Lipid panel July 2011 triglycerides 266 cholesterol 173 HDL 28 LDL 92 VLDL 53  . Ischemic cardiomyopathy     05/2009 Successful percutaneous intervention of RCA and LAD using nondrug-eluting platform; Moderate nonobstructive coronary artery disease by  catheterization in June 2008. Status post stenting of the RCA in 2006.Status post Medtronic biventricular implantable cardioverter-defibrillator implantation. S/P RV lead revision after multiple ICD shocks for sprint fidelis fracture   . Chronic systolic heart failure     Ejection fraction 45% by echo in June 2008.  . Impingement syndrome of left shoulder   .  Aneurysm     Apical; previously on Coumadin  . Chronic renal insufficiency     Creatinine 1.17 January 2010  . Tobacco abuse     Discontinued  . Peripheral polyneuropathy     refractory to several medications    Past Surgical History  Procedure Date  . Abdominal hysterectomy   . Cardiac defibrillator placement 05/28/2009    Medtronic ICD placed by Dr. Caryl Comes secondary to ICM/CHF, [phone] Fidelis lead fracture with ICD storm 2010 requiring new RV lead placement    Family History  Problem Relation Age of Onset  . Coronary artery disease Neg Hx     History   Social History  . Marital Status: Married    Spouse Name: N/A    Number of Children: N/A  . Years of Education: N/A   Occupational  History  . Retired    Social History Main Topics  . Smoking status: Former Smoker -- 0.8 packs/day for 30 years    Types: Cigarettes    Quit date: 06/27/2002  . Smokeless tobacco: Never Used  . Alcohol Use: No  . Drug Use: Not on file  . Sexually Active: Not on file   Other Topics Concern  . Not on file   Social History Narrative  . No narrative on file   [id number] positives as outlined above. The remainder of the 18  point review of systems is negative  PHYSICAL EXAM BP 109/71  Pulse 69  Ht 5\' 6"  (1.676 m)  Wt 173 lb (78.472 kg)  BMI 27.92 kg/m2  General: Well-developed, well-nourished in no distress Head: Normocephalic and atraumatic Eyes:PERRLA/EOMI intact, conjunctiva and lids normal Ears: No deformity or lesions Mouth:normal dentition, normal posterior pharynx Neck: Supple, no JVD.  No masses, thyromegaly or abnormal cervical nodes Lungs: Normal breath sounds bilaterally without wheezing.  Normal percussion Cardiac: regular rate and rhythm with normal S1 and S2, no S3 or S4.  PMI is normal.  No pathological murmurs Abdomen: Normal bowel sounds, abdomen is soft and nontender without masses, organomegaly or hernias noted.  No hepatosplenomegaly MSK: Back normal, normal gait muscle strength and tone normal Vascular: Pulse is normal in all 4 extremities Extremities: No peripheral pitting edema Neurologic: Alert and oriented x 3 Skin: Intact without lesions or rashes Lymphatics: No significant adenopathy Psychologic: Normal affect   ECG: Not available  ASSESSMENT AND PLAN

## 2011-02-14 NOTE — Assessment & Plan Note (Signed)
Lower extremity pain secondary to diabetic neuropathy. I told the patient that she can increase her tramadol 100 mg every 6 hours as needed and to discuss this further with her primary care physician in the next visit.

## 2011-02-16 ENCOUNTER — Encounter: Payer: Self-pay | Admitting: Internal Medicine

## 2011-02-16 ENCOUNTER — Other Ambulatory Visit: Payer: Self-pay

## 2011-02-17 ENCOUNTER — Ambulatory Visit (INDEPENDENT_AMBULATORY_CARE_PROVIDER_SITE_OTHER): Payer: Medicare Other | Admitting: *Deleted

## 2011-02-17 DIAGNOSIS — Z95810 Presence of automatic (implantable) cardiac defibrillator: Secondary | ICD-10-CM

## 2011-02-17 DIAGNOSIS — I428 Other cardiomyopathies: Secondary | ICD-10-CM

## 2011-02-17 DIAGNOSIS — I5022 Chronic systolic (congestive) heart failure: Secondary | ICD-10-CM

## 2011-02-21 LAB — REMOTE ICD DEVICE
ATRIAL PACING ICD: 4.44 pct
BRDY-0002LV: 50 {beats}/min
BRDY-0003LV: 130 {beats}/min
BRDY-0004LV: 120 {beats}/min
CHARGE TIME: 9.319 s
FVT: 0
RV LEAD IMPEDENCE ICD: 703 Ohm
TOT-0001: 0
TZAT-0001ATACH: 3
TZAT-0001FASTVT: 1
TZAT-0001SLOWVT: 1
TZAT-0001SLOWVT: 2
TZAT-0002FASTVT: NEGATIVE
TZAT-0012ATACH: 150 ms
TZAT-0012ATACH: 150 ms
TZAT-0012ATACH: 150 ms
TZAT-0012FASTVT: 200 ms
TZAT-0012SLOWVT: 200 ms
TZAT-0012SLOWVT: 200 ms
TZAT-0013SLOWVT: 2
TZAT-0013SLOWVT: 2
TZAT-0018FASTVT: NEGATIVE
TZAT-0019ATACH: 6 V
TZAT-0019SLOWVT: 8 V
TZAT-0019SLOWVT: 8 V
TZAT-0020ATACH: 1.5 ms
TZAT-0020ATACH: 1.5 ms
TZAT-0020SLOWVT: 1.5 ms
TZAT-0020SLOWVT: 1.5 ms
TZON-0003SLOWVT: 340 ms
TZON-0003VSLOWVT: 450 ms
TZST-0001ATACH: 4
TZST-0001ATACH: 6
TZST-0001FASTVT: 2
TZST-0001FASTVT: 4
TZST-0001FASTVT: 5
TZST-0001SLOWVT: 5
TZST-0002ATACH: NEGATIVE
TZST-0002FASTVT: NEGATIVE
TZST-0002FASTVT: NEGATIVE
TZST-0002FASTVT: NEGATIVE
TZST-0003SLOWVT: 25 J
TZST-0003SLOWVT: 35 J
VENTRICULAR PACING ICD: 99.97 pct
VF: 0

## 2011-03-01 ENCOUNTER — Encounter: Payer: Self-pay | Admitting: *Deleted

## 2011-03-07 NOTE — Progress Notes (Signed)
icd remote w/icm check

## 2011-03-23 ENCOUNTER — Encounter: Payer: Self-pay | Admitting: Cardiology

## 2011-04-13 ENCOUNTER — Other Ambulatory Visit: Payer: Self-pay | Admitting: Cardiology

## 2011-04-13 LAB — CBC
HCT: 31.8 — ABNORMAL LOW
Hemoglobin: 11.6 — ABNORMAL LOW
MCV: 85.8
Platelets: 194
RBC: 3.71 — ABNORMAL LOW
RDW: 13.9
WBC: 6.4

## 2011-04-13 LAB — PROTIME-INR
INR: 1
Prothrombin Time: 13.5

## 2011-04-13 LAB — APTT: aPTT: 30

## 2011-04-13 LAB — POCT I-STAT 3, VENOUS BLOOD GAS (G3P V)
Acid-Base Excess: 2
O2 Saturation: 60

## 2011-04-13 LAB — BASIC METABOLIC PANEL
BUN: 40 — ABNORMAL HIGH
Calcium: 9.4
Chloride: 102
Creatinine, Ser: 1.59 — ABNORMAL HIGH
GFR calc Af Amer: 42 — ABNORMAL LOW
GFR calc non Af Amer: 32 — ABNORMAL LOW
GFR calc non Af Amer: 35 — ABNORMAL LOW
Glucose, Bld: 169 — ABNORMAL HIGH
Potassium: 4.1
Sodium: 137

## 2011-04-13 LAB — POCT I-STAT 3, ART BLOOD GAS (G3+)
Operator id: 151231
pH, Arterial: 7.374

## 2011-05-26 ENCOUNTER — Ambulatory Visit (INDEPENDENT_AMBULATORY_CARE_PROVIDER_SITE_OTHER): Payer: Medicare Other | Admitting: *Deleted

## 2011-05-26 ENCOUNTER — Other Ambulatory Visit: Payer: Self-pay

## 2011-05-26 ENCOUNTER — Encounter: Payer: Self-pay | Admitting: Internal Medicine

## 2011-05-26 DIAGNOSIS — I428 Other cardiomyopathies: Secondary | ICD-10-CM

## 2011-05-26 DIAGNOSIS — Z95810 Presence of automatic (implantable) cardiac defibrillator: Secondary | ICD-10-CM

## 2011-05-26 DIAGNOSIS — I509 Heart failure, unspecified: Secondary | ICD-10-CM

## 2011-05-27 LAB — REMOTE ICD DEVICE
AL AMPLITUDE: 2.3 mv
ATRIAL PACING ICD: 3.66 pct
BAMS-0001: 170 {beats}/min
BATTERY VOLTAGE: 3.0401 V
BRDY-0002LV: 50 {beats}/min
FVT: 0
LV LEAD THRESHOLD: 1.25 V
RV LEAD IMPEDENCE ICD: 779 Ohm
TOT-0006: 20101202000000
TZAT-0001ATACH: 1
TZAT-0002ATACH: NEGATIVE
TZAT-0002ATACH: NEGATIVE
TZAT-0005SLOWVT: 88 pct
TZAT-0005SLOWVT: 91 pct
TZAT-0011SLOWVT: 10 ms
TZAT-0011SLOWVT: 10 ms
TZAT-0012ATACH: 150 ms
TZAT-0012SLOWVT: 200 ms
TZAT-0012SLOWVT: 200 ms
TZAT-0018ATACH: NEGATIVE
TZAT-0018SLOWVT: NEGATIVE
TZAT-0018SLOWVT: NEGATIVE
TZAT-0019ATACH: 6 V
TZAT-0019ATACH: 6 V
TZAT-0019FASTVT: 8 V
TZAT-0019SLOWVT: 8 V
TZAT-0020ATACH: 1.5 ms
TZAT-0020FASTVT: 1.5 ms
TZON-0003ATACH: 350 ms
TZON-0003SLOWVT: 340 ms
TZON-0005SLOWVT: 12
TZST-0001ATACH: 4
TZST-0001ATACH: 5
TZST-0001FASTVT: 2
TZST-0001FASTVT: 3
TZST-0001FASTVT: 5
TZST-0001SLOWVT: 4
TZST-0001SLOWVT: 6
TZST-0002FASTVT: NEGATIVE
TZST-0002FASTVT: NEGATIVE
TZST-0002FASTVT: NEGATIVE
TZST-0003SLOWVT: 15 J
TZST-0003SLOWVT: 35 J
TZST-0003SLOWVT: 35 J
VF: 0

## 2011-05-31 NOTE — Progress Notes (Signed)
Remote icd check w/icm  

## 2011-06-13 ENCOUNTER — Other Ambulatory Visit: Payer: Self-pay | Admitting: Cardiology

## 2011-06-23 ENCOUNTER — Encounter: Payer: Self-pay | Admitting: *Deleted

## 2011-09-01 ENCOUNTER — Encounter: Payer: Self-pay | Admitting: Internal Medicine

## 2011-09-01 ENCOUNTER — Ambulatory Visit (INDEPENDENT_AMBULATORY_CARE_PROVIDER_SITE_OTHER): Payer: Medicare Other | Admitting: *Deleted

## 2011-09-01 DIAGNOSIS — I428 Other cardiomyopathies: Secondary | ICD-10-CM

## 2011-09-01 DIAGNOSIS — Z95810 Presence of automatic (implantable) cardiac defibrillator: Secondary | ICD-10-CM

## 2011-09-01 DIAGNOSIS — I5022 Chronic systolic (congestive) heart failure: Secondary | ICD-10-CM

## 2011-09-02 LAB — REMOTE ICD DEVICE
AL AMPLITUDE: 2 mv
ATRIAL PACING ICD: 3.51 pct
BAMS-0001: 170 {beats}/min
BRDY-0002LV: 50 {beats}/min
FVT: 0
RV LEAD AMPLITUDE: 23.5 mv
RV LEAD IMPEDENCE ICD: 741 Ohm
TZAT-0001ATACH: 2
TZAT-0001ATACH: 3
TZAT-0002ATACH: NEGATIVE
TZAT-0005SLOWVT: 91 pct
TZAT-0011SLOWVT: 10 ms
TZAT-0011SLOWVT: 10 ms
TZAT-0012ATACH: 150 ms
TZAT-0012ATACH: 150 ms
TZAT-0012SLOWVT: 200 ms
TZAT-0013SLOWVT: 2
TZAT-0018ATACH: NEGATIVE
TZAT-0018ATACH: NEGATIVE
TZAT-0018ATACH: NEGATIVE
TZAT-0018SLOWVT: NEGATIVE
TZAT-0019ATACH: 6 V
TZAT-0019SLOWVT: 8 V
TZAT-0020ATACH: 1.5 ms
TZAT-0020FASTVT: 1.5 ms
TZAT-0020SLOWVT: 1.5 ms
TZON-0003SLOWVT: 340 ms
TZON-0003VSLOWVT: 450 ms
TZON-0004VSLOWVT: 20
TZON-0005SLOWVT: 12
TZST-0001ATACH: 6
TZST-0001FASTVT: 2
TZST-0001FASTVT: 3
TZST-0001SLOWVT: 3
TZST-0001SLOWVT: 5
TZST-0002ATACH: NEGATIVE
TZST-0002ATACH: NEGATIVE
TZST-0002FASTVT: NEGATIVE
TZST-0003SLOWVT: 25 J
TZST-0003SLOWVT: 35 J
VF: 0

## 2011-09-05 ENCOUNTER — Encounter: Payer: Self-pay | Admitting: *Deleted

## 2011-09-07 NOTE — Progress Notes (Signed)
Remote defib check w/icm  

## 2011-10-11 ENCOUNTER — Other Ambulatory Visit: Payer: Self-pay | Admitting: Family Medicine

## 2011-10-22 ENCOUNTER — Other Ambulatory Visit: Payer: Self-pay | Admitting: Cardiology

## 2011-10-26 ENCOUNTER — Encounter: Payer: Self-pay | Admitting: Cardiology

## 2011-10-26 ENCOUNTER — Ambulatory Visit (INDEPENDENT_AMBULATORY_CARE_PROVIDER_SITE_OTHER): Payer: Medicare Other | Admitting: Cardiology

## 2011-10-26 DIAGNOSIS — I255 Ischemic cardiomyopathy: Secondary | ICD-10-CM | POA: Insufficient documentation

## 2011-10-26 DIAGNOSIS — Z789 Other specified health status: Secondary | ICD-10-CM | POA: Insufficient documentation

## 2011-10-26 DIAGNOSIS — G609 Hereditary and idiopathic neuropathy, unspecified: Secondary | ICD-10-CM

## 2011-10-26 DIAGNOSIS — I2510 Atherosclerotic heart disease of native coronary artery without angina pectoris: Secondary | ICD-10-CM

## 2011-10-26 DIAGNOSIS — I2589 Other forms of chronic ischemic heart disease: Secondary | ICD-10-CM

## 2011-10-26 DIAGNOSIS — Z95810 Presence of automatic (implantable) cardiac defibrillator: Secondary | ICD-10-CM

## 2011-10-26 DIAGNOSIS — G629 Polyneuropathy, unspecified: Secondary | ICD-10-CM

## 2011-10-26 DIAGNOSIS — I428 Other cardiomyopathies: Secondary | ICD-10-CM

## 2011-10-26 MED ORDER — METOPROLOL TARTRATE 25 MG PO TABS: ORAL_TABLET | ORAL | Status: DC

## 2011-10-26 MED ORDER — ISOSORBIDE MONONITRATE ER 30 MG PO TB24: 30.0000 mg | ORAL_TABLET | Freq: Every day | ORAL | Status: DC

## 2011-10-26 MED ORDER — VENLAFAXINE HCL ER 75 MG PO CP24: 75.0000 mg | ORAL_CAPSULE | Freq: Every day | ORAL | Status: DC

## 2011-10-26 MED ORDER — NITROGLYCERIN 0.4 MG SL SUBL: 0.4000 mg | SUBLINGUAL_TABLET | SUBLINGUAL | Status: DC | PRN

## 2011-10-26 NOTE — Patient Instructions (Addendum)
   Stop Citalopram  Change to Venlafaxine (Effexor) 75mg  (long acting) daily  Begin Imdur 30mg  daily Your physician wants you to follow up in: 6 months.  You will receive a reminder letter in the mail one-two months in advance.  If you don't receive a letter, please call our office to schedule the follow up appointment

## 2011-10-26 NOTE — Progress Notes (Signed)
Shirley Real, MD, [facility]IM Board Certified in Adult Cardiovascular Medicine,Internal Medicine and Critical Care Medicine    CC: Followup patient with coronary artery disease and ischemic cardiomyopathy  HPI:  The patient has an ejection fraction of 30-35%. She reports generalized weakness and shortness of breath on exertion. However this is chronic in large part related to deconditioning. She denies any chest pain. She had stenting done in 2010 to the LAD and RCA. She does not report any significant ischemia symptoms/angina. She has no orthopnea or PND palpitations or syncope. Her main complaint is severe peripheral neuropathy for which he takes frequent Ultram. She's also concerned that her pacemaker may be malfunctioning but there is no evidence on recent interrogation. Otherwise the patient has been stable from a cardiac perspective and reports no defibrillator discharges.  PMH: reviewed and listed in Problem List in Electronic Records (and see below) Past Medical History  Diagnosis Date  . Diabetes mellitus     IDDM; July 2011 hemoglobin A1c 6.3  . Anxiety disorder   . GERD (gastroesophageal reflux disease)   . Hyperlipidemia     Statin Intolerant; Lipid panel July 2011 triglycerides 266 cholesterol 173 HDL 28 LDL 92 VLDL 53  . Ischemic cardiomyopathy     05/2009 Successful percutaneous intervention of RCA and LAD using nondrug-eluting platform; Moderate nonobstructive coronary artery disease by  catheterization in June 2008. Status post stenting of the RCA in 2006.Status post Medtronic biventricular implantable cardioverter-defibrillator implantation. S/P RV lead revision after multiple ICD shocks for sprint fidelis fracture   . Chronic systolic heart failure     Ejection fraction 45% by echo in June 2008.  . Impingement syndrome of left shoulder   . Aneurysm     Apical; previously on Coumadin  . Chronic renal insufficiency     Creatinine 1.17 January 2010  . Tobacco abuse    Discontinued  . Peripheral polyneuropathy     refractory to several medications  . Coronary artery disease     most recent catheterization 2010 with stenting of the LAD with a bare-metal stent and a bare-metal stent to the right coronary artery  . Biventricular implantable cardiac defibrillator in situ   . Statin intolerance    Past Surgical History  Procedure Date  . Abdominal hysterectomy   . Cardiac defibrillator placement 05/28/2009    Medtronic ICD placed by Dr. Caryl Comes secondary to ICM/CHF, [phone] Fidelis lead fracture with ICD storm 2010 requiring new RV lead placement    Allergies/SH/FHX : available in Electronic Records for review  Allergies  Allergen Reactions  . Metformin   . Sulfonamide Derivatives    History   Social History  . Marital Status: Married    Spouse Name: N/A    Number of Children: N/A  . Years of Education: N/A   Occupational History  . Retired    Social History Main Topics  . Smoking status: Former Smoker -- 0.8 packs/day for 30 years    Types: Cigarettes    Quit date: 06/27/2002  . Smokeless tobacco: Never Used  . Alcohol Use: No  . Drug Use: Not on file  . Sexually Active: Not on file   Other Topics Concern  . Not on file   Social History Narrative  . No narrative on file   Family History  Problem Relation Age of Onset  . Coronary artery disease Neg Hx     Medications: Current Outpatient Prescriptions  Medication Sig Dispense Refill  . ALPRAZolam (XANAX) 0.5 MG  tablet Take 1 tablet by mouth daily as needed.       Marland Kitchen aspirin 325 MG tablet Take 1 tablet by mouth daily.       . carvedilol (COREG) 6.25 MG tablet TAKE ONE TABLET BY MOUTH TWICE DAILY  180 tablet  3  . dicyclomine (BENTYL) 10 MG capsule Take 10 mg by mouth 2 (two) times daily as needed.       . folic acid (FOLVITE) A999333 MCG tablet Take 400 mcg by mouth daily.      . furosemide (LASIX) 80 MG tablet Take 1 tablet (80 mg total) by mouth 2 (two) times daily.  60 tablet  6  .  insulin glargine (LANTUS) 100 UNIT/ML injection Inject subcutaneous as directed.       Marland Kitchen KLOR-CON M20 20 MEQ tablet TAKE ONE TABLET BY MOUTH TWICE DAILY  60 each  6  . loratadine (CLARITIN) 10 MG tablet Take 10 mg by mouth daily.      Marland Kitchen omeprazole (PRILOSEC OTC) 20 MG tablet Take 1 tablet by mouth daily.       . traMADol (ULTRAM) 50 MG tablet Take 1-2 tablets by mouth every 4-6 hours as needed pain.       . vitamin B-12 (CYANOCOBALAMIN) 1000 MCG tablet Take 1 tablet by mouth daily.       . isosorbide mononitrate (IMDUR) 30 MG 24 hr tablet Take 1 tablet (30 mg total) by mouth daily.  30 tablet  6  . venlafaxine XR (EFFEXOR-XR) 75 MG 24 hr capsule Take 1 capsule (75 mg total) by mouth daily.  30 capsule  1  . DISCONTD: furosemide (LASIX) 80 MG tablet TAKE ONE TABLET BY MOUTH TWICE DAILY  60 tablet  6    ROS: No nausea or vomiting. No fever or chills.No melena or hematochezia.No bleeding.No claudication  Physical Exam: BP 113/71  Pulse 72  Ht 5\' 6"  (1.676 m)  Wt 173 lb (78.472 kg)  BMI 27.92 kg/m2 General: Well-nourished white female pale-appearing  Neck: Normal carotid upstroke no carotid bruits. No thyromegaly nonnodular thyroid. JVP is 7 cm Lungs: Clear breath sounds bilaterally without wheezing. Cardiac: Regular rate and rhythm with normal S1-S2 and no murmur rubs or gallops Vascular: No edema. Normal distal pulses Skin: Warm and dry Physcologic: Normal affect  12lead ECG: Not obtained Limited bedside ECHO:N/A No images are attached to the encounter.   Assessment and Plan  Biventricular implantable cardiac defibrillator in situ Patient reports no discharge. Normal functional defibrillator apparently. Is followed by the EP clinic.  Peripheral polyneuropathy Patient has severe refractory peripheral polyneuropathy for which he takes Ultram. She did not respond and had side effects to gabapentin and Lyrica. I told the patient that we could switch her citalopram to venlafaxine which  has been controlling properties not seen with citalopram. I discontinued citalopram and started her on venlafaxine 75 mg a day  Ischemic cardiomyopathy Patient reports no recurrent chest pain. She does have shortness of breath on exertion. She states that he is short of breath on for months. She has occasional chest pain for which he takes nitroglycerin. Overall her pattern appears to be stable. She also reports occasional palpitations and is wondering if this is related to malfunction of her defibrillator. I tried to reassure her that this is not the case and she is followed by the EP clinic. At this point she does not need an echocardiogram or stress testing.    Patient Active Problem List  Diagnoses  . HYPERLIPIDEMIA-MIXED  .  DEPRESSION, SITUATIONAL  . CORONARY ARTERY DISEASE, S/P PTCA  . SYSTOLIC HEART FAILURE, CHRONIC  . PULMONARY NODULE  . KNEE, ARTHRITIS, DEGEN./OSTEO  . SHOULDER PAIN  . Other affections of shoulder region, not elsewhere classified  . SHORTNESS OF BREATH  . ABDOMINAL PAIN, GENERALIZED  . IMPLANTATION OF DEFIBRILLATOR, HX OF  . HYPOTENSION, ORTHOSTATIC  . Diabetic neuropathy  . Statin intolerance  . Biventricular implantable cardiac defibrillator in situ  . Coronary artery disease  . Peripheral polyneuropathy  . Ischemic cardiomyopathy

## 2011-10-26 NOTE — Assessment & Plan Note (Signed)
Patient has severe refractory peripheral polyneuropathy for which he takes Ultram. She did not respond and had side effects to gabapentin and Lyrica. I told the patient that we could switch her citalopram to venlafaxine which has been controlling properties not seen with citalopram. I discontinued citalopram and started her on venlafaxine 75 mg a day

## 2011-10-26 NOTE — Assessment & Plan Note (Signed)
Patient reports no recurrent chest pain. She does have shortness of breath on exertion. She states that he is short of breath on for months. She has occasional chest pain for which he takes nitroglycerin. Overall her pattern appears to be stable. She also reports occasional palpitations and is wondering if this is related to malfunction of her defibrillator. I tried to reassure her that this is not the case and she is followed by the EP clinic. At this point she does not need an echocardiogram or stress testing.

## 2011-10-26 NOTE — Assessment & Plan Note (Signed)
Patient reports no discharge. Normal functional defibrillator apparently. Is followed by the EP clinic.

## 2011-11-02 ENCOUNTER — Telehealth: Payer: Self-pay | Admitting: *Deleted

## 2011-11-02 NOTE — Telephone Encounter (Signed)
Was recently started on Venlaxafine & Imdur on 5/1.  Thinks one of these meds is making her neuropathy worse.  Has noticed the change since beginning these.  C/O burning & stinging in feet.  States pain is worse than it usually is.  States she has always had a hard time taking antidepressants.

## 2011-11-06 NOTE — Telephone Encounter (Signed)
OK to stop venlafaxine.

## 2011-11-09 NOTE — Telephone Encounter (Signed)
Left message to return call 

## 2011-11-11 NOTE — Telephone Encounter (Addendum)
Patient notified and verbalized understanding.  Advised to follow up with PMD regarding feet stinging & burning.

## 2011-12-12 ENCOUNTER — Other Ambulatory Visit: Payer: Self-pay | Admitting: Cardiology

## 2011-12-22 ENCOUNTER — Encounter: Payer: Self-pay | Admitting: Internal Medicine

## 2011-12-22 ENCOUNTER — Ambulatory Visit (INDEPENDENT_AMBULATORY_CARE_PROVIDER_SITE_OTHER): Payer: Medicare Other | Admitting: Internal Medicine

## 2011-12-22 VITALS — BP 128/77 | HR 78 | Resp 18 | Ht 66.0 in | Wt 174.0 lb

## 2011-12-22 DIAGNOSIS — I428 Other cardiomyopathies: Secondary | ICD-10-CM | POA: Insufficient documentation

## 2011-12-22 DIAGNOSIS — I2510 Atherosclerotic heart disease of native coronary artery without angina pectoris: Secondary | ICD-10-CM

## 2011-12-22 DIAGNOSIS — I255 Ischemic cardiomyopathy: Secondary | ICD-10-CM

## 2011-12-22 DIAGNOSIS — I5022 Chronic systolic (congestive) heart failure: Secondary | ICD-10-CM

## 2011-12-22 DIAGNOSIS — I2589 Other forms of chronic ischemic heart disease: Secondary | ICD-10-CM

## 2011-12-22 LAB — ICD DEVICE OBSERVATION
AL IMPEDENCE ICD: 551 Ohm
AL THRESHOLD: 0.75 V
ATRIAL PACING ICD: 3.63 pct
BAMS-0001: 170 {beats}/min
BATTERY VOLTAGE: 2.9923 V
CHARGE TIME: 9.989 s
LV LEAD IMPEDENCE ICD: 494 Ohm
PACEART VT: 0
TOT-0001: 0
TOT-0002: 0
TOT-0006: 20101202000000
TZAT-0001ATACH: 1
TZAT-0001ATACH: 2
TZAT-0001ATACH: 3
TZAT-0001FASTVT: 1
TZAT-0001SLOWVT: 1
TZAT-0002ATACH: NEGATIVE
TZAT-0002FASTVT: NEGATIVE
TZAT-0004SLOWVT: 8
TZAT-0004SLOWVT: 8
TZAT-0005SLOWVT: 88 pct
TZAT-0005SLOWVT: 91 pct
TZAT-0012ATACH: 150 ms
TZAT-0012ATACH: 150 ms
TZAT-0012SLOWVT: 200 ms
TZAT-0012SLOWVT: 200 ms
TZAT-0013SLOWVT: 2
TZAT-0013SLOWVT: 2
TZAT-0018ATACH: NEGATIVE
TZAT-0018ATACH: NEGATIVE
TZAT-0018FASTVT: NEGATIVE
TZAT-0018SLOWVT: NEGATIVE
TZAT-0018SLOWVT: NEGATIVE
TZAT-0019ATACH: 6 V
TZAT-0020ATACH: 1.5 ms
TZAT-0020SLOWVT: 1.5 ms
TZON-0003ATACH: 350 ms
TZON-0003SLOWVT: 340 ms
TZON-0004SLOWVT: 28
TZON-0005SLOWVT: 12
TZST-0001ATACH: 4
TZST-0001ATACH: 6
TZST-0001FASTVT: 2
TZST-0001FASTVT: 3
TZST-0001FASTVT: 4
TZST-0001FASTVT: 6
TZST-0001SLOWVT: 6
TZST-0002ATACH: NEGATIVE
TZST-0002FASTVT: NEGATIVE
TZST-0002FASTVT: NEGATIVE
TZST-0002FASTVT: NEGATIVE
TZST-0003SLOWVT: 25 J
TZST-0003SLOWVT: 35 J
VENTRICULAR PACING ICD: 99.96 pct

## 2011-12-22 NOTE — Assessment & Plan Note (Signed)
No ischemic symptoms No changes today 

## 2011-12-22 NOTE — Assessment & Plan Note (Signed)
Normal BiV ICD function today No vibratory alerts have been delivered and her leads are functioning normally. She is s/p RV lead revision previously after multiple ICD shocks for sprint fidelis fracture. I think that her symptoms are likely related to anxiety/ PTSD following this event. I have reassured her today. No further EP workup is planned.  I offered CXR today (for peace of mind) which she has declined.  See Claudia Desan[facility] Art report No changes today

## 2011-12-22 NOTE — Patient Instructions (Addendum)
Continue all current medications

## 2011-12-22 NOTE — Progress Notes (Signed)
PCP:Deloria Lair, MD Primary Cardiologist:  Dr [name]  The patient presents today for routine electrophysiology followup.  Since last being seen in our clinic, the patient reports doing well.  She reports occasional "shaking" over her defibrillator site which she thinks is similar to the vibratory alert when her prior Fidelis 6949 lead fractured.  This causes her significant anxiety.  Today, she denies symptoms of palpitations, chest pain, shortness of breath, orthopnea, PND, lower extremity edema, dizziness, presyncope, syncope, or neurologic sequela.  The patient feels that she is tolerating medications without difficulties and is otherwise without complaint today.   Past Medical History  Diagnosis Date  . Diabetes mellitus     IDDM; July 2011 hemoglobin A1c 6.3  . Anxiety disorder   . GERD (gastroesophageal reflux disease)   . Hyperlipidemia     Statin Intolerant; Lipid panel July 2011 triglycerides 266 cholesterol 173 HDL 28 LDL 92 VLDL 53  . Ischemic cardiomyopathy     05/2009 Successful percutaneous intervention of RCA and LAD using nondrug-eluting platform; Moderate nonobstructive coronary artery disease by  catheterization in June 2008. Status post stenting of the RCA in 2006.Status post Medtronic biventricular implantable cardioverter-defibrillator implantation. S/P RV lead revision after multiple ICD shocks for sprint fidelis fracture   . Chronic systolic heart failure     Ejection fraction 45% by echo in June 2008.  . Impingement syndrome of left shoulder   . Aneurysm     Apical; previously on Coumadin  . Chronic renal insufficiency     Creatinine 1.17 January 2010  . Tobacco abuse     Discontinued  . Peripheral polyneuropathy     refractory to several medications  . Coronary artery disease     most recent catheterization 2010 with stenting of the LAD with a bare-metal stent and a bare-metal stent to the right coronary artery  . Biventricular implantable cardiac defibrillator in  situ   . Statin intolerance    Past Surgical History  Procedure Date  . Abdominal hysterectomy   . Cardiac defibrillator placement 05/28/2009    Medtronic ICD placed by Dr. Caryl Comes secondary to ICM/CHF, [phone] Fidelis lead fracture with ICD storm 2010 requiring new RV lead placement    Current Outpatient Prescriptions  Medication Sig Dispense Refill  . ALPRAZolam (XANAX) 0.5 MG tablet Take 1 tablet by mouth daily as needed.       Marland Kitchen aspirin 325 MG tablet Take 1 tablet by mouth daily.       . carvedilol (COREG) 6.25 MG tablet TAKE ONE TABLET BY MOUTH TWICE DAILY  180 tablet  3  . citalopram (CELEXA) 10 MG tablet Take 10 mg by mouth daily.      Marland Kitchen dicyclomine (BENTYL) 10 MG capsule Take 10 mg by mouth 2 (two) times daily as needed.       . folic acid (FOLVITE) A999333 MCG tablet Take 400 mcg by mouth daily.      . furosemide (LASIX) 80 MG tablet Take 1 tablet (80 mg total) by mouth 2 (two) times daily.  60 tablet  6  . insulin glargine (LANTUS) 100 UNIT/ML injection Inject subcutaneous as directed.       Marland Kitchen KLOR-CON M20 20 MEQ tablet TAKE ONE TABLET BY MOUTH TWICE DAILY  60 each  6  . loratadine (CLARITIN) 10 MG tablet Take 10 mg by mouth as needed.       Marland Kitchen omeprazole (PRILOSEC OTC) 20 MG tablet Take 1 tablet by mouth daily.       Marland Kitchen  traMADol (ULTRAM) 50 MG tablet Take 1-2 tablets by mouth every 4-6 hours as needed pain.       . vitamin B-12 (CYANOCOBALAMIN) 1000 MCG tablet Take 1 tablet by mouth daily.         Allergies  Allergen Reactions  . Metformin   . Sulfonamide Derivatives     History   Social History  . Marital Status: Married    Spouse Name: N/A    Number of Children: N/A  . Years of Education: N/A   Occupational History  . Retired    Social History Main Topics  . Smoking status: Former Smoker -- 0.8 packs/day for 30 years    Types: Cigarettes    Quit date: 06/27/2002  . Smokeless tobacco: Never Used  . Alcohol Use: No  . Drug Use: Not on file  . Sexually Active: Not on  file   Other Topics Concern  . Not on file   Social History Narrative  . No narrative on file    Family History  Problem Relation Age of Onset  . Coronary artery disease Neg Hx    Physical Exam: Filed Vitals:   12/22/11 1003  BP: 128/77  Pulse: 78  Resp: 18  Height: 5\' 6"  (1.676 m)  Weight: 174 lb (78.926 kg)    GEN- The patient is well appearing, alert and oriented x 3 today.   Head- normocephalic, atraumatic Eyes-  Sclera clear, conjunctiva pink Ears- hearing intact Oropharynx- clear Neck- supple, no JVP Lymph- no cervical lymphadenopathy Lungs- Clear to ausculation bilaterally, normal work of breathing Chest- ICD pocket is well healed Heart- Regular rate and rhythm, no murmurs, rubs or gallops, PMI not laterally displaced GI- soft, NT, ND, + BS Extremities- no clubbing, cyanosis, or edema Neuro- strength and sensation are intact  ICD interrogation- reviewed in detail today,  See PACEART report  Assessment and Plan:

## 2012-02-14 DIAGNOSIS — R079 Chest pain, unspecified: Secondary | ICD-10-CM

## 2012-02-15 ENCOUNTER — Encounter (HOSPITAL_COMMUNITY): Payer: Self-pay

## 2012-02-15 ENCOUNTER — Inpatient Hospital Stay (HOSPITAL_COMMUNITY)
Admission: AD | Admit: 2012-02-15 | Discharge: 2012-02-20 | DRG: 280 | Disposition: A | Discharge status: Home / Self Care | Payer: Medicare Other | Source: Ambulatory Visit | Attending: Cardiology | Admitting: Cardiology

## 2012-02-15 ENCOUNTER — Inpatient Hospital Stay
Admission: EM | Admit: 2012-02-15 | Payer: Self-pay | Source: Other Acute Inpatient Hospital | Admitting: Cardiovascular Disease

## 2012-02-15 ENCOUNTER — Other Ambulatory Visit: Payer: Self-pay | Admitting: Physician Assistant

## 2012-02-15 ENCOUNTER — Encounter (HOSPITAL_COMMUNITY)
Admission: AD | Disposition: A | Discharge status: Home / Self Care | Payer: Self-pay | Source: Ambulatory Visit | Attending: Cardiology

## 2012-02-15 DIAGNOSIS — F3289 Other specified depressive episodes: Secondary | ICD-10-CM | POA: Diagnosis present

## 2012-02-15 DIAGNOSIS — I2589 Other forms of chronic ischemic heart disease: Secondary | ICD-10-CM | POA: Diagnosis present

## 2012-02-15 DIAGNOSIS — I5043 Acute on chronic combined systolic (congestive) and diastolic (congestive) heart failure: Secondary | ICD-10-CM | POA: Diagnosis present

## 2012-02-15 DIAGNOSIS — L02419 Cutaneous abscess of limb, unspecified: Secondary | ICD-10-CM | POA: Diagnosis present

## 2012-02-15 DIAGNOSIS — Z87891 Personal history of nicotine dependence: Secondary | ICD-10-CM

## 2012-02-15 DIAGNOSIS — E119 Type 2 diabetes mellitus without complications: Secondary | ICD-10-CM

## 2012-02-15 DIAGNOSIS — I509 Heart failure, unspecified: Secondary | ICD-10-CM | POA: Diagnosis present

## 2012-02-15 DIAGNOSIS — Z794 Long term (current) use of insulin: Secondary | ICD-10-CM

## 2012-02-15 DIAGNOSIS — Z7982 Long term (current) use of aspirin: Secondary | ICD-10-CM

## 2012-02-15 DIAGNOSIS — Z7902 Long term (current) use of antithrombotics/antiplatelets: Secondary | ICD-10-CM

## 2012-02-15 DIAGNOSIS — M25819 Other specified joint disorders, unspecified shoulder: Secondary | ICD-10-CM | POA: Diagnosis present

## 2012-02-15 DIAGNOSIS — I2510 Atherosclerotic heart disease of native coronary artery without angina pectoris: Secondary | ICD-10-CM | POA: Diagnosis present

## 2012-02-15 DIAGNOSIS — R748 Abnormal levels of other serum enzymes: Secondary | ICD-10-CM

## 2012-02-15 DIAGNOSIS — Z79899 Other long term (current) drug therapy: Secondary | ICD-10-CM

## 2012-02-15 DIAGNOSIS — K219 Gastro-esophageal reflux disease without esophagitis: Secondary | ICD-10-CM | POA: Diagnosis present

## 2012-02-15 DIAGNOSIS — R079 Chest pain, unspecified: Secondary | ICD-10-CM

## 2012-02-15 DIAGNOSIS — E785 Hyperlipidemia, unspecified: Secondary | ICD-10-CM | POA: Diagnosis present

## 2012-02-15 DIAGNOSIS — Z882 Allergy status to sulfonamides status: Secondary | ICD-10-CM

## 2012-02-15 DIAGNOSIS — N289 Disorder of kidney and ureter, unspecified: Secondary | ICD-10-CM | POA: Diagnosis present

## 2012-02-15 DIAGNOSIS — F329 Major depressive disorder, single episode, unspecified: Secondary | ICD-10-CM | POA: Diagnosis present

## 2012-02-15 DIAGNOSIS — IMO0001 Reserved for inherently not codable concepts without codable children: Secondary | ICD-10-CM | POA: Diagnosis present

## 2012-02-15 DIAGNOSIS — I214 Non-ST elevation (NSTEMI) myocardial infarction: Secondary | ICD-10-CM

## 2012-02-15 DIAGNOSIS — I059 Rheumatic mitral valve disease, unspecified: Secondary | ICD-10-CM

## 2012-02-15 DIAGNOSIS — G609 Hereditary and idiopathic neuropathy, unspecified: Secondary | ICD-10-CM | POA: Diagnosis present

## 2012-02-15 DIAGNOSIS — Z9861 Coronary angioplasty status: Secondary | ICD-10-CM

## 2012-02-15 DIAGNOSIS — N189 Chronic kidney disease, unspecified: Secondary | ICD-10-CM | POA: Diagnosis present

## 2012-02-15 HISTORY — DX: Major depressive disorder, single episode, unspecified: F32.9

## 2012-02-15 HISTORY — DX: Depression, unspecified: F32.A

## 2012-02-15 HISTORY — PX: LEFT HEART CATHETERIZATION WITH CORONARY ANGIOGRAM: SHX5451

## 2012-02-15 LAB — GLUCOSE, CAPILLARY: Glucose-Capillary: 302 mg/dL — ABNORMAL HIGH (ref 70–99)

## 2012-02-15 SURGERY — LEFT HEART CATHETERIZATION WITH CORONARY ANGIOGRAM
Anesthesia: LOCAL

## 2012-02-15 MED ORDER — LORATADINE 10 MG PO TABS
10.0000 mg | ORAL_TABLET | ORAL | Status: DC | PRN
  Filled 2012-02-15: qty 1

## 2012-02-15 MED ORDER — INSULIN ASPART 100 UNIT/ML ~~LOC~~ SOLN
0.0000 [IU] | Freq: Every day | SUBCUTANEOUS | Status: DC
  Administered 2012-02-15 – 2012-02-16 (×2): 4 [IU] via SUBCUTANEOUS
  Administered 2012-02-17: 3 [IU] via SUBCUTANEOUS
  Administered 2012-02-18: 2 [IU] via SUBCUTANEOUS

## 2012-02-15 MED ORDER — CARVEDILOL 6.25 MG PO TABS
6.2500 mg | ORAL_TABLET | Freq: Two times a day (BID) | ORAL | Status: DC
  Administered 2012-02-15 – 2012-02-19 (×7): 6.25 mg via ORAL
  Filled 2012-02-15 (×10): qty 1

## 2012-02-15 MED ORDER — ASPIRIN 81 MG PO CHEW: 324.0000 mg | CHEWABLE_TABLET | ORAL | Status: DC

## 2012-02-15 MED ORDER — ONDANSETRON HCL 4 MG/2ML IJ SOLN: 4.0000 mg | Freq: Four times a day (QID) | INTRAMUSCULAR | Status: DC | PRN

## 2012-02-15 MED ORDER — SODIUM CHLORIDE 0.9 % IV SOLN
INTRAVENOUS | Status: DC
  Administered 2012-02-15: 75 mL/h via INTRAVENOUS

## 2012-02-15 MED ORDER — HEPARIN (PORCINE) IN NACL 2-0.9 UNIT/ML-% IJ SOLN
INTRAMUSCULAR | Status: AC
  Filled 2012-02-15: qty 2000

## 2012-02-15 MED ORDER — CARVEDILOL 6.25 MG PO TABS: 6.2500 mg | ORAL_TABLET | Freq: Two times a day (BID) | ORAL | Status: DC

## 2012-02-15 MED ORDER — MIDAZOLAM HCL 2 MG/2ML IJ SOLN
INTRAMUSCULAR | Status: AC
  Filled 2012-02-15: qty 2

## 2012-02-15 MED ORDER — CITALOPRAM HYDROBROMIDE 20 MG PO TABS: 20.0000 mg | ORAL_TABLET | Freq: Every day | ORAL | Status: DC

## 2012-02-15 MED ORDER — MORPHINE SULFATE 4 MG/ML IJ SOLN
1.0000 mg | Freq: Once | INTRAMUSCULAR | Status: AC
  Administered 2012-02-15: 1 mg via INTRAVENOUS

## 2012-02-15 MED ORDER — TRAMADOL HCL 50 MG PO TABS
50.0000 mg | ORAL_TABLET | Freq: Four times a day (QID) | ORAL | Status: DC | PRN
  Administered 2012-02-15 – 2012-02-20 (×16): 50 mg via ORAL
  Filled 2012-02-15 (×18): qty 1

## 2012-02-15 MED ORDER — LIDOCAINE HCL (PF) 1 % IJ SOLN
INTRAMUSCULAR | Status: AC
  Filled 2012-02-15: qty 30

## 2012-02-15 MED ORDER — ASPIRIN 325 MG PO TABS
325.0000 mg | ORAL_TABLET | Freq: Every day | ORAL | Status: DC
  Administered 2012-02-16 – 2012-02-20 (×5): 325 mg via ORAL
  Filled 2012-02-15 (×6): qty 1

## 2012-02-15 MED ORDER — FOLIC ACID 0.5 MG HALF TAB
0.5000 mg | ORAL_TABLET | Freq: Every day | ORAL | Status: DC
  Administered 2012-02-16 – 2012-02-20 (×5): 0.5 mg via ORAL
  Filled 2012-02-15 (×7): qty 1

## 2012-02-15 MED ORDER — TRAMADOL HCL 50 MG PO TABS: 50.0000 mg | ORAL_TABLET | Freq: Four times a day (QID) | ORAL | Status: DC | PRN

## 2012-02-15 MED ORDER — MORPHINE SULFATE 4 MG/ML IJ SOLN
INTRAMUSCULAR | Status: AC
  Filled 2012-02-15: qty 1

## 2012-02-15 MED ORDER — FUROSEMIDE 80 MG PO TABS
80.0000 mg | ORAL_TABLET | Freq: Two times a day (BID) | ORAL | Status: DC
  Administered 2012-02-15 (×2): 80 mg via ORAL
  Filled 2012-02-15 (×4): qty 1

## 2012-02-15 MED ORDER — VERAPAMIL HCL 2.5 MG/ML IV SOLN
INTRAVENOUS | Status: AC
  Filled 2012-02-15: qty 2

## 2012-02-15 MED ORDER — PANTOPRAZOLE SODIUM 40 MG PO TBEC: 40.0000 mg | DELAYED_RELEASE_TABLET | Freq: Every day | ORAL | Status: DC

## 2012-02-15 MED ORDER — POTASSIUM CHLORIDE CRYS ER 10 MEQ PO TBCR: 10.0000 meq | EXTENDED_RELEASE_TABLET | Freq: Two times a day (BID) | ORAL | Status: DC

## 2012-02-15 MED ORDER — SODIUM CHLORIDE 0.9 % IJ SOLN: 3.0000 mL | INTRAMUSCULAR | Status: DC | PRN

## 2012-02-15 MED ORDER — CEPHALEXIN 500 MG PO CAPS
500.0000 mg | ORAL_CAPSULE | Freq: Four times a day (QID) | ORAL | Status: DC
  Administered 2012-02-15 – 2012-02-20 (×20): 500 mg via ORAL
  Filled 2012-02-15 (×23): qty 1

## 2012-02-15 MED ORDER — SODIUM CHLORIDE 0.9 % IV SOLN: 250.0000 mL | INTRAVENOUS | Status: DC | PRN

## 2012-02-15 MED ORDER — HEPARIN SODIUM (PORCINE) 5000 UNIT/ML IJ SOLN
5000.0000 [IU] | Freq: Three times a day (TID) | INTRAMUSCULAR | Status: DC
  Administered 2012-02-15 – 2012-02-20 (×15): 5000 [IU] via SUBCUTANEOUS
  Filled 2012-02-15 (×17): qty 1

## 2012-02-15 MED ORDER — HEPARIN SODIUM (PORCINE) 1000 UNIT/ML IJ SOLN
INTRAMUSCULAR | Status: AC
  Filled 2012-02-15: qty 1

## 2012-02-15 MED ORDER — SODIUM CHLORIDE 0.9 % IV SOLN: INTRAVENOUS | Status: DC

## 2012-02-15 MED ORDER — INSULIN GLARGINE 100 UNIT/ML ~~LOC~~ SOLN: 60.0000 [IU] | Freq: Two times a day (BID) | SUBCUTANEOUS | Status: DC

## 2012-02-15 MED ORDER — CITALOPRAM HYDROBROMIDE 10 MG PO TABS
10.0000 mg | ORAL_TABLET | Freq: Every day | ORAL | Status: DC
  Administered 2012-02-16 – 2012-02-20 (×5): 10 mg via ORAL
  Filled 2012-02-15 (×7): qty 1

## 2012-02-15 MED ORDER — ASPIRIN EC 81 MG PO TBEC: 81.0000 mg | DELAYED_RELEASE_TABLET | Freq: Every day | ORAL | Status: DC

## 2012-02-15 MED ORDER — ALPRAZOLAM 0.25 MG PO TABS: 0.2500 mg | ORAL_TABLET | Freq: Every evening | ORAL | Status: DC | PRN

## 2012-02-15 MED ORDER — DICYCLOMINE HCL 10 MG PO CAPS
10.0000 mg | ORAL_CAPSULE | Freq: Two times a day (BID) | ORAL | Status: DC | PRN
  Filled 2012-02-15: qty 1

## 2012-02-15 MED ORDER — SODIUM CHLORIDE 0.9 % IJ SOLN: 3.0000 mL | Freq: Two times a day (BID) | INTRAMUSCULAR | Status: DC

## 2012-02-15 MED ORDER — INSULIN ASPART 100 UNIT/ML ~~LOC~~ SOLN
0.0000 [IU] | Freq: Three times a day (TID) | SUBCUTANEOUS | Status: DC
  Administered 2012-02-15: 5 [IU] via SUBCUTANEOUS
  Administered 2012-02-16 (×2): 8 [IU] via SUBCUTANEOUS
  Administered 2012-02-16: 11 [IU] via SUBCUTANEOUS
  Administered 2012-02-17 (×2): 5 [IU] via SUBCUTANEOUS
  Administered 2012-02-17: 2 [IU] via SUBCUTANEOUS
  Administered 2012-02-18: 3 [IU] via SUBCUTANEOUS
  Administered 2012-02-18: 5 [IU] via SUBCUTANEOUS
  Administered 2012-02-18: 3 [IU] via SUBCUTANEOUS
  Administered 2012-02-19: 5 [IU] via SUBCUTANEOUS
  Administered 2012-02-19 (×2): 2 [IU] via SUBCUTANEOUS
  Administered 2012-02-20: 3 [IU] via SUBCUTANEOUS

## 2012-02-15 MED ORDER — FUROSEMIDE 80 MG PO TABS: 80.0000 mg | ORAL_TABLET | Freq: Two times a day (BID) | ORAL | Status: DC

## 2012-02-15 MED ORDER — PANTOPRAZOLE SODIUM 40 MG PO TBEC
40.0000 mg | DELAYED_RELEASE_TABLET | Freq: Every day | ORAL | Status: DC
  Administered 2012-02-16 – 2012-02-20 (×5): 40 mg via ORAL
  Filled 2012-02-15 (×5): qty 1

## 2012-02-15 MED ORDER — NITROGLYCERIN IN D5W 200-5 MCG/ML-% IV SOLN
3.0000 ug/min | INTRAVENOUS | Status: DC
  Administered 2012-02-15: 10 ug/min via INTRAVENOUS

## 2012-02-15 MED ORDER — ALPRAZOLAM 0.5 MG PO TABS
0.5000 mg | ORAL_TABLET | Freq: Every day | ORAL | Status: DC
  Administered 2012-02-16 – 2012-02-19 (×5): 0.5 mg via ORAL
  Filled 2012-02-15 (×5): qty 1

## 2012-02-15 MED ORDER — DICYCLOMINE HCL 10 MG PO CAPS
10.0000 mg | ORAL_CAPSULE | Freq: Every morning | ORAL | Status: DC
  Administered 2012-02-16 – 2012-02-20 (×5): 10 mg via ORAL
  Filled 2012-02-15 (×6): qty 1

## 2012-02-15 MED ORDER — DIAZEPAM 5 MG PO TABS: 5.0000 mg | ORAL_TABLET | ORAL | Status: DC

## 2012-02-15 MED ORDER — NITROGLYCERIN 0.4 MG SL SUBL: 0.4000 mg | SUBLINGUAL_TABLET | SUBLINGUAL | Status: DC | PRN

## 2012-02-15 MED ORDER — POTASSIUM CHLORIDE CRYS ER 20 MEQ PO TBCR
20.0000 meq | EXTENDED_RELEASE_TABLET | Freq: Every day | ORAL | Status: DC
  Administered 2012-02-15 – 2012-02-18 (×4): 20 meq via ORAL
  Filled 2012-02-15 (×5): qty 1

## 2012-02-15 MED ORDER — MORPHINE SULFATE 10 MG/ML IJ SOLN: 1.0000 mg | INTRAMUSCULAR | Status: DC | PRN

## 2012-02-15 MED ORDER — MORPHINE SULFATE 2 MG/ML IJ SOLN
INTRAMUSCULAR | Status: AC
  Administered 2012-02-15: 1 mg via INTRAVENOUS
  Filled 2012-02-15: qty 1

## 2012-02-15 MED ORDER — ACETAMINOPHEN 325 MG PO TABS: 650.0000 mg | ORAL_TABLET | ORAL | Status: DC | PRN

## 2012-02-15 MED ORDER — VITAMIN B-12 1000 MCG PO TABS
1000.0000 ug | ORAL_TABLET | Freq: Every day | ORAL | Status: DC
  Administered 2012-02-16 – 2012-02-20 (×5): 1000 ug via ORAL
  Filled 2012-02-15 (×6): qty 1

## 2012-02-15 MED ORDER — FOLIC ACID 400 MCG PO TABS: 400.0000 ug | ORAL_TABLET | Freq: Every day | ORAL | Status: DC

## 2012-02-15 MED ORDER — NITROGLYCERIN 0.2 MG/ML ON CALL CATH LAB
INTRAVENOUS | Status: AC
  Filled 2012-02-15: qty 1

## 2012-02-15 MED ORDER — INSULIN GLARGINE 100 UNIT/ML ~~LOC~~ SOLN
60.0000 [IU] | Freq: Two times a day (BID) | SUBCUTANEOUS | Status: DC
  Administered 2012-02-15 – 2012-02-16 (×3): 60 [IU] via SUBCUTANEOUS
  Administered 2012-02-17: 11:00:00 via SUBCUTANEOUS
  Administered 2012-02-17 – 2012-02-20 (×6): 60 [IU] via SUBCUTANEOUS

## 2012-02-15 MED ORDER — ACETAMINOPHEN 325 MG PO TABS
650.0000 mg | ORAL_TABLET | ORAL | Status: DC | PRN
  Administered 2012-02-16: 650 mg via ORAL
  Filled 2012-02-15: qty 2

## 2012-02-15 MED ORDER — OMEPRAZOLE MAGNESIUM 20 MG PO TBEC: 20.0000 mg | DELAYED_RELEASE_TABLET | ORAL | Status: DC

## 2012-02-15 MED FILL — Nitroglycerin IV Soln 200 MCG/ML in D5W: INTRAVENOUS | Qty: 250 | Status: AC

## 2012-02-15 MED FILL — Heparin Sodium (Porcine) 100 Unt/ML in Sodium Chloride 0.45%: INTRAMUSCULAR | Qty: 250 | Status: AC

## 2012-02-15 MED FILL — Morphine Sulfate Inj 10 MG/ML: INTRAMUSCULAR | Qty: 1 | Status: AC

## 2012-02-15 NOTE — Progress Notes (Signed)
Pt complains of chest pain described as a pressure and rates the pain at 3/10.  Ntg gtt was restarted at 10 mcg and 1 mg of morphine was given iv per prn orders.  Pt is currently having 2D Echo completed.

## 2012-02-15 NOTE — Interval H&P Note (Signed)
History and Physical Interval Note:  02/15/2012 1:01 PM  [name]  has presented today for surgery, with the diagnosis of cp  The various methods of treatment have been discussed with the patient and family. After consideration of risks, benefits and other options for treatment, the patient has consented to  Procedure(s) (LRB): LEFT HEART CATHETERIZATION WITH CORONARY ANGIOGRAM (N/A) as a surgical intervention .  The patient's history has been reviewed, patient examined, no change in status, stable for surgery.  I have reviewed the patient's chart and labs.  Questions were answered to the patient's satisfaction.     [name] [facility] 02/15/2012 1:01 PM

## 2012-02-15 NOTE — H&P (Signed)
Patient transferred from [facility] in [place] with a NSTEMI and ongoing chest pain. Complete H&P performed at [facility] and will be scanned into the computer record.  Peter Martinique MD, [facility]

## 2012-02-15 NOTE — CV Procedure (Signed)
   Cardiac Catheterization Procedure Note  Name: [name] MRN: [id number] DOB: June 25, 1934  Procedure:  Selective Coronary Angiography  Indication: 78 year old white female with history of ischemic cardiomyopathy and known coronary disease. She is status post stenting of the proximal and mid RCA and the proximal LAD. She presented to outlying hospital with onset of chest pain yesterday evening at 7 PM. Cardiac enzymes are positive for myocardial infarction. ECG is uninterpretable due to paced rhythm. She has had ongoing chest pain throughout the night.   Procedural Details: The right wrist was prepped, draped, and anesthetized with 1% lidocaine. Using the modified Seldinger technique, a 5 French sheath was introduced into the right radial artery. 3 mg of verapamil was administered through the sheath, weight-based unfractionated heparin was administered intravenously. Standard Judkins catheters were used for selective coronary angiography and left ventriculography. Catheter exchanges were performed over an exchange length guidewire. There were no immediate procedural complications. A TR band was used for radial hemostasis at the completion of the procedure.  The patient was transferred to the post catheterization recovery area for further monitoring. 40 cc of Omnipaque was used for the procedure.  Procedural Findings: Hemodynamics: AO 94/56 with a mean of 72 mmHg  Coronary angiography: Coronary dominance: right  Left mainstem: There is moderate calcified disease in the distal left main up to 40%.  Left anterior descending (LAD): There is a 50% ostial stenosis in the LAD. The stent in the proximal LAD is widely patent with diffuse 20-30% in-stent disease. The second diagonal branch has a 90% stenosis at its origin.  Left circumflex (LCx): The left circumflex coronary is occluded in the mid vessel and reconstituted by faint left to left collaterals.  Right coronary artery (RCA): The  right coronary is occluded proximally at the site of prior stent.  Left ventriculography: Left ventricular angiography was not performed.  Final Conclusions:   1. Three-vessel obstructive coronary disease. The culprit lesion is the right coronary which is occluded at the site of a prior stent. The stent in the proximal LAD is patent. The left circumflex occlusion is chronic. The diagonal disease is also chronic. The patient is now 18 hours out from her presentation and her chest pain is currently resolved.  Recommendations: I would recommend continued medical therapy at this point. We will assess her LV function by echocardiogram. I think there is little advantage at this point to try and reperfuse the right coronary given her very late presentation and improvement in her chest pain symptoms.  [name] [facility] 02/15/2012, 1:40 PM

## 2012-02-15 NOTE — Progress Notes (Signed)
  Echocardiogram 2D Echocardiogram has been performed.  [name] 02/15/2012, 4:57 PM

## 2012-02-16 DIAGNOSIS — I214 Non-ST elevation (NSTEMI) myocardial infarction: Secondary | ICD-10-CM

## 2012-02-16 LAB — BASIC METABOLIC PANEL
Chloride: 97 mEq/L (ref 96–112)
GFR calc Af Amer: 54 mL/min — ABNORMAL LOW (ref >90)
GFR calc non Af Amer: 46 mL/min — ABNORMAL LOW (ref >90)
Potassium: 4 mEq/L (ref 3.5–5.1)
Sodium: 135 mEq/L (ref 135–145)

## 2012-02-16 LAB — CARDIAC PANEL(CRET KIN+CKTOT+MB+TROPI)
CK, MB: 76.6 ng/mL (ref 0.3–4.0)
Relative Index: 7.8 — ABNORMAL HIGH (ref 0.0–2.5)
Total CK: 983 U/L — ABNORMAL HIGH (ref 7–177)
Troponin I: >20 ng/mL (ref <0.30)

## 2012-02-16 LAB — LIPID PANEL
HDL: 23 mg/dL — ABNORMAL LOW (ref >39)
Total CHOL/HDL Ratio: 5.9 RATIO
Triglycerides: 243 mg/dL — ABNORMAL HIGH (ref <150)

## 2012-02-16 LAB — HEMOGLOBIN A1C: Mean Plasma Glucose: 235 mg/dL — ABNORMAL HIGH (ref <117)

## 2012-02-16 LAB — GLUCOSE, CAPILLARY
Glucose-Capillary: 263 mg/dL — ABNORMAL HIGH (ref 70–99)
Glucose-Capillary: 259 mg/dL — ABNORMAL HIGH (ref 70–99)

## 2012-02-16 MED ORDER — LISINOPRIL 2.5 MG PO TABS
2.5000 mg | ORAL_TABLET | Freq: Every day | ORAL | Status: DC
  Administered 2012-02-16: 2.5 mg via ORAL
  Filled 2012-02-16 (×2): qty 1

## 2012-02-16 MED ORDER — CLOPIDOGREL BISULFATE 75 MG PO TABS
75.0000 mg | ORAL_TABLET | Freq: Every day | ORAL | Status: DC
  Administered 2012-02-16 – 2012-02-20 (×5): 75 mg via ORAL
  Filled 2012-02-16 (×8): qty 1

## 2012-02-16 MED ORDER — FUROSEMIDE 10 MG/ML IJ SOLN
80.0000 mg | Freq: Two times a day (BID) | INTRAMUSCULAR | Status: DC
  Administered 2012-02-16 (×2): 80 mg via INTRAVENOUS
  Filled 2012-02-16 (×4): qty 8

## 2012-02-16 MED ORDER — ATORVASTATIN CALCIUM 20 MG PO TABS
20.0000 mg | ORAL_TABLET | Freq: Every day | ORAL | Status: DC
  Administered 2012-02-16: 20 mg via ORAL
  Filled 2012-02-16 (×2): qty 1

## 2012-02-16 MED ORDER — SODIUM CHLORIDE 0.9 % IV SOLN
INTRAVENOUS | Status: DC
  Administered 2012-02-16: 10 mL/h via INTRAVENOUS

## 2012-02-16 NOTE — Clinical Documentation Improvement (Signed)
CHF DOCUMENTATION CLARIFICATION QUERY  THIS DOCUMENT IS NOT A PERMANENT PART OF THE MEDICAL RECORD  TO RESPOND TO THE THIS QUERY, FOLLOW THE INSTRUCTIONS BELOW:  1. If needed, update documentation for the patient's encounter via the notes activity.  2. Access this query again and click edit on the In [place] [place].  3. After updating, or not, click F2 to complete all highlighted (required) fields concerning your review. Select "additional documentation in the medical record" OR "no additional documentation provided".  4. Click Sign note button.  5. The deficiency will fall out of your In Basket *Please let us know if you are not able to complete this workflow by phone or e-mail (listed below).  Please update your documentation within the medical record to reflect your response to this query.                                                                                    02/16/12  Dear Dr [facility],  In a better effort to capture your patient's severity of illness, reflect appropriate length of stay and utilization of resources, a review of the patient medical record has revealed the following indicators the diagnosis of Heart Failure.   Based on your clinical judgment, please clarify and document in a progress note and/or discharge summary the clinical condition associated with the following supporting information:In responding to this query please exercise your independent judgment.  The fact that a query is asked, does not imply that any particular answer is desired or expected.  Possible Clinical Conditions?  Acute on Chronic Systolic Congestive Heart Failure  Acute on Chronic Diastolic Congestive Heart Failure  Acute on Chronic Systolic & Diastolic  Congestive Heart Failure  Other Condition  Cannot Clinically Determine  Supporting Information:   Risk Factors: ischemic cardiomyopathy, chronic systolic and diastolic chf, 1. NSTEMI: Cath 02/15/12 revealed RCA as  culprit lesion with occlusion at site of prior stent, patent LAD stent, chronic LCx and Diagonal dz  Signs & Symptoms: per progress note 8/22: weight up 1 lb.; "diurese as she is volume overloaded"; CAD  Diagnostics: Echo results: Echo revealed EF 30-35%, inf, post, inferoseptal akinesis, grade 2 diastolic dysfunction, mod reduced RV systolic fxn. EF has remained unchanged, but RV function has worsened since last echo in 2011 (previously normal RV systolic fxn).  [id number] ventricular-paced rhythm Biventricular pacemaker detected  Radiology:1. Stable cardiomegaly without failure. 2. Mild subsegmental atelectasis or scarring at the left base. 3. Low lung volumes.  treatment: Meds:acei Diuretics: lasix  Reviewed:  Thank You,  Joya Salm RN, BSN, CCM   Clinical Documentation Specialist: [phone] [email or web address]

## 2012-02-16 NOTE — Clinical Documentation Improvement (Signed)
CKD DOCUMENTATION CLARIFICATION QUERY   THIS DOCUMENT IS NOT A PERMANENT PART OF THE MEDICAL RECORD  TO RESPOND TO THE THIS QUERY, FOLLOW THE INSTRUCTIONS BELOW:  1. If needed, update documentation for the patient's encounter via the notes activity.  2. Access this query again and click edit on the In [employer].  3. After updating, or not, click F2 to complete all highlighted (required) fields concerning your review. Select "additional documentation in the medical record" OR "no additional documentation provided".  4. Click Sign note button.  5. The deficiency will fall out of your In Basket *Please let us know if you are not able to complete this workflow by phone or e-mail (listed below).  Please update your documentation within the medical record to reflect your response to this query.                                                                                        02/16/12   Dear Dr[facility],  In a better effort to capture your patient's severity of illness, reflect appropriate length of stay and utilization of resources, a review of the patient medical record has revealed the following indicators. Based on your clinical judgment, please clarify and document in a progress note and/or discharge summary the clinical condition associated with the following supporting information: In responding to this query please exercise your independent judgment.  The fact that a query is asked, does not imply that any particular answer is desired or expected.  Possible Clinical Conditions?   Acute on Chronic Renal Failure  Chronic Renal Failure  CKD Stage III - GFR 30-59  Other condition  Cannot Clinically determine   Supporting Information:  Risk Factors: Chronic Renal Insufficiency: Stage 3. Baseline Crt ~ 1.2. BMET pending this am.  Diagnostics: -Lab: -Current Cr Level : 1.13   -Current BUN Level: 27  -GFR: 46  Treatments: monitor, evaluate  You may use  possible, probable, or suspect with inpatient documentation. possible, probable, suspected diagnoses MUST be documented at the time of discharge  Reviewed:   Thank You,  Era Bumpers, BSN, CCM    Clinical Documentation Specialist: [email or web address]

## 2012-02-16 NOTE — Care Management Note (Signed)
  Page 1 of 1   02/16/2012     3:08:19 PM   CARE MANAGEMENT NOTE 02/16/2012  Patient:  [name], [name]   Account Number:  [id number]  Date Initiated:  02/15/2012  Documentation initiated by:  Felix Ahmadi Assessment:   78 yr-old female transferred from [facility] with dx of NSTEMI; lives alone     Action/Plan:   Anticipated DC Date:     Anticipated DC Plan:        DC Planning Services  CM consult      Choice offered to / List presented to:             Status of service:   Medicare Important Message given?   (If response is "NO", the following Medicare IM given date fields will be blank) Date Medicare IM given:   Date Additional Medicare IM given:    Discharge Disposition:    Per UR Regulation:  Reviewed for med. necessity/level of care/duration of stay  If discussed at Long Length of Stay Meetings, dates discussed:    Comments:  PCP:  Dr. [name]  02/16/12 Kelli[place] RN MSN CCM NTG gtt restarted 2/2 c/o chest pain, troponin > 20.  Pt's O2 sats 80s when ambulating to BR, on O2 @ 2L/min [place].  Per pt, she has been independent and active until recently, brother lives in area and is supportive but also has health issues. 8/22 15:06p debbie Antonios Ostrow rn,bsn pt states in donut hole w medicare part d at present. she gets her insulin thru drug company. gave her pt assist forms for several non generic meds. gave pt 2 prescripton assist cards that may also help w non generic meds.

## 2012-02-16 NOTE — Progress Notes (Signed)
Notified  Eugenia Mcalpine NP of patients B/P . Pt asymptomatic at this time. Nitro gtt discontinued.

## 2012-02-16 NOTE — Clinical Documentation Improvement (Signed)
RESPIRATORY FAILURE DOCUMENTATION CLARIFICATION QUERY   THIS DOCUMENT IS NOT A PERMANENT PART OF THE MEDICAL RECORD  TO RESPOND TO THE THIS QUERY, FOLLOW THE INSTRUCTIONS BELOW:  1. If needed, update documentation for the patient's encounter via the notes activity.  2. Access this query again and click edit on the In [employer].  3. After updating, or not, click F2 to complete all highlighted (required) fields concerning your review. Select "additional documentation in the medical record" OR "no additional documentation provided".  4. Click Sign note button.  5. The deficiency will fall out of your In Basket *Please let us know if you are not able to complete this workflow by phone or e-mail (listed below).  Please update your documentation within the medical record to reflect your response to this query.                                                                                    02/16/12  Dear [name],  In a better effort to capture your patient's severity of illness, reflect appropriate length of stay and utilization of resources, a review of the patient medical record has revealed the following indicators.Based on your clinical judgment, please clarify and document in a progress note and/or discharge summary the clinical condition associated with the following supporting information:In responding to this query please exercise your independent judgment.  The fact that a query is asked, does not imply that any particular answer is desired or expected.  Possible Clinical Conditions?  Acute Respiratory Failure  Acute Respiratory Distress  Other Condition  Cannot Clinically Determine   Supporting Information:  Risk Factors: Three-vessel obstructive coronary disease. The culprit lesion is the right coronary which is occluded at the site of a prior stent. The stent in the proximal LAD is patent. The left circumflex occlusion is chronic. The diagonal disease is  also chronic.  Signs&Symptoms:JVD elevated  Bibasilar rales. Requiring supp O2. she is volume overloaded; rr: 16-27; Has required supplemental O2 as sats dropped into the 80s when she ambulated to the bathroom on room air. Denies sob  Diagnostics: Radiology:Stable cardiomegaly without failure.  2. Mild subsegmental atelectasis or scarring at the left base.  3. Low lung volumes  Treatment: Oxygen:2 l/m [place]  You may use possible, probable, or suspect with inpatient documentation. possible, probable, suspected diagnoses MUST be documented at the time of discharge  Reviewed:   Thank You,  Joya Salm RN, BSN, CCM   Clinical Documentation Specialist: [email or web address] [phone]  Health Information Management [place]  The patient is not in respiratory distress nor does she have acute respiratory failure.

## 2012-02-16 NOTE — Progress Notes (Signed)
Inpatient Diabetes Program Recommendations  AACE/ADA: New Consensus Statement on Inpatient Glycemic Control (2013)  Target Ranges:  Prepandial:   less than 140 mg/dL      Peak postprandial:   less than 180 mg/dL (1-2 hours)      Critically ill patients:  140 - 180 mg/dL    Inpatient Diabetes Program Recommendations Insulin - Meal Coverage: Add Novolog 6 units with meals TID HgbA1C: =9.8  Thank you  Raoul Pitch Abrom [facility] Inpatient Diabetes Coordinator [phone]

## 2012-02-16 NOTE — Progress Notes (Addendum)
Cardiology Progress Note Patient Name: [name] Date of Encounter: 02/16/2012, 7:20 AM     Subjective  Patient complained of chest pressure last night relieved with IV NTG. Pain free this morning. Has required supplemental O2 as sats dropped into the 80s when she ambulated to the bathroom on room air. Denies sob.   Objective   Telemetry: V-paced rhythm, 80-90s  Medications: . ALPRAZolam  0.5 mg Oral QHS  . aspirin  325 mg Oral Daily  . carvedilol  6.25 mg Oral BID WC  . cephALEXin  500 mg Oral Q6H  . citalopram  10 mg Oral Daily  . dicyclomine  10 mg Oral q morning - 123XX123  . folic acid  0.5 mg Oral Daily  . furosemide  80 mg Oral BID  . heparin  5,000 Units Subcutaneous Q8H  . insulin aspart  0-15 Units Subcutaneous TID WC  . insulin aspart  0-5 Units Subcutaneous QHS  . insulin glargine  60 Units Subcutaneous BID  . lidocaine      . midazolam      . morphine      . morphine  1 mg Intravenous Once  . nitroGLYCERIN      . pantoprazole  40 mg Oral Q1200  . potassium chloride SA  20 mEq Oral Daily  . verapamil      . vitamin B-12  1,000 mcg Oral Daily   . sodium chloride 75 mL/hr (02/15/12 1500)  . nitroGLYCERIN 10 mcg/min (02/15/12 1600)    Physical Exam: Temp:  [98.4 F (36.9 C)-99.1 F (37.3 C)] 99.1 F (37.3 C) (08/22 0400) Pulse Rate:  [82-99] 94  (08/22 0700) Resp:  [16-27] 25  (08/22 0700) BP: (76-117)/(39-65) 117/63 mmHg (08/22 0700) SpO2:  [91 %-97 %] 93 % (08/22 0700) Weight:  [175 lb 11.3 oz (79.7 kg)-176 lb 12.9 oz (80.2 kg)] 176 lb 12.9 oz (80.2 kg) (08/22 0500)  General: Overweight elderly white female, in no acute distress. Head: Normocephalic, atraumatic, sclera non-icteric, nares are without discharge.  Neck: Supple. Negative for carotid bruits. JVD elevated Lungs: Bibasilar rales. No wheezes or rhonchi. Breathing is unlabored on 2L O2. Heart: RRR S1 S2 without murmurs, rubs, or gallops.  Abdomen: Soft, non-tender, non-distended  with normoactive bowel sounds. No rebound/guarding. No obvious abdominal masses. Msk:  Strength and tone appear normal for age. Extremities: No edema. No clubbing or cyanosis. Distal pedal pulses are intact and equal bilaterally. Neuro: Alert and oriented X 3. Moves all extremities spontaneously. Psych:  Responds to questions appropriately with a normal affect.   Intake/Output Summary (Last 24 hours) at 02/16/12 0720 Last data filed at 02/16/12 0600  Gross per 24 hour  Intake   1047 ml  Output    850 ml  Net    197 ml    Labs:  [facility] 02/16/12 0620  NA 135  K 4.0  CL 97  CO2 26  GLUCOSE 282*  BUN 27*  CREATININE 1.13*  CALCIUM 8.9   Radiology/Studies:   02/15/12 - Cardiac Cath Hemodynamics:  AO 94/56 with a mean of 72 mmHg  Coronary angiography:  Coronary dominance: right  Left mainstem: There is moderate calcified disease in the distal left main up to 40%.  Left anterior descending (LAD): There is a 50% ostial stenosis in the LAD. The stent in the proximal LAD is widely patent with diffuse 20-30% in-stent disease. The second diagonal branch has a 90% stenosis at its origin.  Left circumflex (  LCx): The left circumflex coronary is occluded in the mid vessel and reconstituted by faint left to left collaterals.  Right coronary artery (RCA): The right coronary is occluded proximally at the site of prior stent.  Left ventriculography: Left ventricular angiography was not performed.  Final Conclusions:  1. Three-vessel obstructive coronary disease. The culprit lesion is the right coronary which is occluded at the site of a prior stent. The stent in the proximal LAD is patent. The left circumflex occlusion is chronic. The diagonal disease is also chronic. The patient is now 18 hours out from her presentation and her chest pain is currently resolved.  Recommendations: I would recommend continued medical therapy at this point. We will assess her LV function by echocardiogram. I think  there is little advantage at this point to try and reperfuse the right coronary given her very late presentation and improvement in her chest pain symptoms.  02/15/12 - 2D Echo Study Conclusions: - Left ventricle: The cavity size was normal. Wall thickness was normal. Systolic function was moderately to severely reduced. The estimated ejection fraction was in the range of 30% to 35%. Inferior, posterior, and inferoseptal akinesis. Features are consistent with a pseudonormal left ventricular filling pattern, with concomitant abnormal relaxation and increased filling pressure (grade 2 diastolic dysfunction). - Aortic valve: There was no stenosis. - Mitral valve: Mild regurgitation. - Right ventricle: The cavity size was normal. Pacer wire or catheter noted in right ventricle. Systolic function was moderately reduced. - Pulmonary arteries: PA peak pressure: 57mm Hg (S). - Systemic veins: IVC 1.8 cm with some respirophasic variation, suggesting RA pressure 10 mmHg. Impressions: Findings consistent with large inferoposterior MI with RV involvement. EF 30-35%.    Assessment and Plan   1. NSTEMI: Cath 02/15/12 revealed RCA as culprit lesion with occlusion at site of prior stent, patent LAD stent, chronic LCx and Diagonal dz. Due to late presentation and resolution of chest pain, recommendations were made for medical therapy and LV assessment by echo. Echo revealed EF 30-35%, inf, post, inferoseptal akinesis, grade 2 diastolic dysfunction, mod reduced RV systolic fxn. EF has remained unchanged, but RV function has worsened since last echo in 2011 (previously normal RV systolic fxn). Had recurrence of chest pain last night, on NTG drip. Check cardiac enzymes. Currently chest pain free. Try to wean NTG to assess response. Further plans per MD recommendations. Consider addition of Plavix. Cont ASA & BB. No Statin 2/2 h/o GI intolerance. H/o orthostatic hypotension on ACEI. Consider trial of low dose  2. CAD: H/o  PCI to RCA 2006, BMS to RCA and LAD 05/2009. Plan as above.  3. Ischemic Cardiomyopathy: Medtronic BiV ICD.  4. Acute on Chronic Systolic and Diastolic CHF: EF 99991111 by echo yesterday (unchanged). Rales on exam. Requiring supp O2. I/Os even. Weight up one lb. On 80mg  oral Lasix BID. Consider IV lasix x1 today. H/o orthostatic hypotension on ACEI. Consider trial of low dose.   5. Diabetes Mellitus, Type 2: Uncontrolled. A1C 9.6. Cont Lantus and SSI. Diabetes management consult.  6. Hyperlipidemia: Lipid panel pending. No Statin 2/2 h/o GI intolerance  7. Chronic Renal Failure: Stage 3. Baseline Crt ~ 1.2. BMET pending this am.  8. Left Foot Cellulitis: Stable. Cont Keflex   Signed, Litsy Epting PA-C  As above; patient seen and examined; mild chest pressure this AM; patient has ruled in for NSTEMI; Plan continue ASA and add plavix; try lipitor to see if she can tolerate; diurese as she is volume overloaded; add  low dose ACEI to see if she tolerates; continue to cycle enzymes.  Kirk Ruths 7:48 AM

## 2012-02-16 NOTE — Progress Notes (Signed)
CRITICAL VALUE ALERT  Critical value received: ckmb 76.6/ troponin >20  Date of notification:  02/16/2012  Time of notification:  [id number]  Critical value read back:yes  Nurse who received alert:  Mihika Surrette E. Rollene Rotunda, RN  MD notified (1st page):  [facility]  Time of first page:  1016  MD notified (2nd page):  Time of second page:  Responding MD:  Wilfred Curtis  Time MD responded:  1017

## 2012-02-17 LAB — CBC
HCT: 34.9 % — ABNORMAL LOW (ref 36.0–46.0)
Hemoglobin: 11.7 g/dL — ABNORMAL LOW (ref 12.0–15.0)
MCH: 29.3 pg (ref 26.0–34.0)
MCV: 87.5 fL (ref 78.0–100.0)
Platelets: 141 10*3/uL — ABNORMAL LOW (ref 150–400)
RBC: 3.99 MIL/uL (ref 3.87–5.11)
WBC: 7.2 10*3/uL (ref 4.0–10.5)

## 2012-02-17 LAB — BASIC METABOLIC PANEL
CO2: 29 mEq/L (ref 19–32)
Chloride: 95 mEq/L — ABNORMAL LOW (ref 96–112)
Creatinine, Ser: 1.39 mg/dL — ABNORMAL HIGH (ref 0.50–1.10)
Glucose, Bld: 172 mg/dL — ABNORMAL HIGH (ref 70–99)

## 2012-02-17 LAB — GLUCOSE, CAPILLARY: Glucose-Capillary: 237 mg/dL — ABNORMAL HIGH (ref 70–99)

## 2012-02-17 MED ORDER — ROSUVASTATIN CALCIUM 5 MG PO TABS
5.0000 mg | ORAL_TABLET | Freq: Every day | ORAL | Status: DC
  Administered 2012-02-17 – 2012-02-20 (×4): 5 mg via ORAL
  Filled 2012-02-17 (×4): qty 1

## 2012-02-17 MED ORDER — NON FORMULARY: Freq: Every morning | Status: DC

## 2012-02-17 MED ORDER — FUROSEMIDE 80 MG PO TABS
80.0000 mg | ORAL_TABLET | Freq: Two times a day (BID) | ORAL | Status: DC
  Administered 2012-02-17 – 2012-02-20 (×7): 80 mg via ORAL
  Filled 2012-02-17 (×9): qty 1

## 2012-02-17 NOTE — Progress Notes (Signed)
Inpatient Diabetes Program Recommendations  AACE/ADA: New Consensus Statement on Inpatient Glycemic Control (2013)  Target Ranges:  Prepandial:   less than 140 mg/dL      Peak postprandial:   less than 180 mg/dL (1-2 hours)      Critically ill patients:  140 - 180 mg/dL   Diabetes Coordinator spoke with patient concerning A1C=9.8.  The patient said that her last one a few months ago was also in the 9 range.  She said she would follow-up with her primary MD concerning the elevation.  She will likely need insulin adjustment.  Thank you  Raoul Pitch [facility] Inpatient Diabetes Coordinator [phone]

## 2012-02-17 NOTE — Progress Notes (Signed)
Cardiology Progress Note Patient Name: [name] Date of Encounter: 02/17/2012, 7:28 AM     Subjective  Patient denies dyspnea or chest pain   Objective    Physical Exam: Temp:  [97.9 F (36.6 C)-99.2 F (37.3 C)] 98.9 F (37.2 C) (08/23 0400) Pulse Rate:  [78-93] 85  (08/23 0400) Resp:  [14-24] 18  (08/23 0400) BP: (77-123)/(37-69) 89/60 mmHg (08/23 0400) SpO2:  [93 %-96 %] 93 % (08/23 0400) Weight:  [176 lb 2.4 oz (79.9 kg)] 176 lb 2.4 oz (79.9 kg) (08/23 0500)  General: Overweight elderly white female, in no acute distress. Head: Normal  Neck: Supple.  Lungs: Mild bibasilar rales.  Heart: RRR S1 S2 without murmurs, rubs, or gallops.  Abdomen: Soft, non-tender, non-distended Extremities: No edema.  Neuro: Grossly intact Psych:  Responds to questions appropriately with a normal affect.   Intake/Output Summary (Last 24 hours) at 02/17/12 0728 Last data filed at 02/17/12 0400  Gross per 24 hour  Intake    890 ml  Output   1250 ml  Net   -360 ml    Labs:  [facility] 02/16/12 0620  NA 135  K 4.0  CL 97  CO2 26  GLUCOSE 282*  BUN 27*  CREATININE 1.13*  CALCIUM 8.9   Radiology/Studies:   02/15/12 - Cardiac Cath Hemodynamics:  AO 94/56 with a mean of 72 mmHg  Coronary angiography:  Coronary dominance: right  Left mainstem: There is moderate calcified disease in the distal left main up to 40%.  Left anterior descending (LAD): There is a 50% ostial stenosis in the LAD. The stent in the proximal LAD is widely patent with diffuse 20-30% in-stent disease. The second diagonal branch has a 90% stenosis at its origin.  Left circumflex (LCx): The left circumflex coronary is occluded in the mid vessel and reconstituted by faint left to left collaterals.  Right coronary artery (RCA): The right coronary is occluded proximally at the site of prior stent.  Left ventriculography: Left ventricular angiography was not performed.  Final Conclusions:  1.  Three-vessel obstructive coronary disease. The culprit lesion is the right coronary which is occluded at the site of a prior stent. The stent in the proximal LAD is patent. The left circumflex occlusion is chronic. The diagonal disease is also chronic. The patient is now 18 hours out from her presentation and her chest pain is currently resolved.  Recommendations: I would recommend continued medical therapy at this point. We will assess her LV function by echocardiogram. I think there is little advantage at this point to try and reperfuse the right coronary given her very late presentation and improvement in her chest pain symptoms.  02/15/12 - 2D Echo Study Conclusions: - Left ventricle: The cavity size was normal. Wall thickness was normal. Systolic function was moderately to severely reduced. The estimated ejection fraction was in the range of 30% to 35%. Inferior, posterior, and inferoseptal akinesis. Features are consistent with a pseudonormal left ventricular filling pattern, with concomitant abnormal relaxation and increased filling pressure (grade 2 diastolic dysfunction). - Aortic valve: There was no stenosis. - Mitral valve: Mild regurgitation. - Right ventricle: The cavity size was normal. Pacer wire or catheter noted in right ventricle. Systolic function was moderately reduced. - Pulmonary arteries: PA peak pressure: 35mm Hg (S). - Systemic veins: IVC 1.8 cm with some respirophasic variation, suggesting RA pressure 10 mmHg. Impressions: Findings consistent with large inferoposterior MI with RV involvement. EF 30-35%.  Assessment and Plan   1. NSTEMI: Cath 02/15/12 revealed RCA as culprit lesion with occlusion at site of prior stent, patent LAD stent, chronic LCx and Diagonal dz. Due to late presentation and resolution of chest pain, recommendations were made for medical therapy and LV assessment by echo. Echo revealed EF 30-35%, inf, post, inferoseptal akinesis, grade 2 diastolic  dysfunction, mod reduced RV systolic fxn. EF has remained unchanged, but RV function has worsened since last echo in 2011 (previously normal RV systolic fxn). FU cardiac enzymes elevated. Currently chest pain free. Plan continue ASA and plavix; change lipitor to crestor (patient with intolerance to lipitor in past). BP decreased and renal function worse. DC lisinopril. Continue coreg.  2. CAD: H/o PCI to RCA 2006, BMS to RCA and LAD 05/2009. Plan as above.  3. Ischemic Cardiomyopathy: Medtronic BiV ICD. BP will not tolerate ACEI at this point.   4. Acute on Chronic Systolic and Diastolic CHF: EF 99991111 by echo yesterday (unchanged). Overall improving and Cr increasing; change lasix to po.  5. Diabetes Mellitus, Type 2: Uncontrolled. A1C 9.6. Cont Lantus and SSI. Diabetes management consult.  6. Hyperlipidemia: As above  7. Acute on chronic renal insufficiency : DC ACEI and change lasix to po. Follow up BMET in AM.  8. Left Foot Cellulitis: Stable. Cont Keflex   Signed, Kirk Ruths MD

## 2012-02-18 LAB — CBC
HCT: 33.4 % — ABNORMAL LOW (ref 36.0–46.0)
Hemoglobin: 11.3 g/dL — ABNORMAL LOW (ref 12.0–15.0)
MCH: 29.7 pg (ref 26.0–34.0)
MCHC: 33.8 g/dL (ref 30.0–36.0)
MCV: 87.9 fL (ref 78.0–100.0)
RBC: 3.8 MIL/uL — ABNORMAL LOW (ref 3.87–5.11)

## 2012-02-18 LAB — GLUCOSE, CAPILLARY
Glucose-Capillary: 151 mg/dL — ABNORMAL HIGH (ref 70–99)
Glucose-Capillary: 207 mg/dL — ABNORMAL HIGH (ref 70–99)

## 2012-02-18 LAB — BASIC METABOLIC PANEL
BUN: 38 mg/dL — ABNORMAL HIGH (ref 6–23)
CO2: 29 mEq/L (ref 19–32)
Calcium: 9.6 mg/dL (ref 8.4–10.5)
GFR calc non Af Amer: 37 mL/min — ABNORMAL LOW (ref >90)
Glucose, Bld: 183 mg/dL — ABNORMAL HIGH (ref 70–99)
Sodium: 139 mEq/L (ref 135–145)

## 2012-02-18 NOTE — Progress Notes (Signed)
Patient ID: [name], female   DOB: 07-Jul-1933, 78 y.o.   MRN: [id number]   Patient Name: [name] Date of Encounter: 02/18/2012    SUBJECTIVE  Less shortness of breath but very fatigued. No further chest pain.  CURRENT MEDS    . ALPRAZolam  0.5 mg Oral QHS  . aspirin  325 mg Oral Daily  . carvedilol  6.25 mg Oral BID WC  . cephALEXin  500 mg Oral Q6H  . citalopram  10 mg Oral Daily  . clopidogrel  75 mg Oral Q breakfast  . dicyclomine  10 mg Oral q morning - 123XX123  . folic acid  0.5 mg Oral Daily  . furosemide  80 mg Oral BID  . heparin  5,000 Units Subcutaneous Q8H  . insulin aspart  0-15 Units Subcutaneous TID WC  . insulin aspart  0-5 Units Subcutaneous QHS  . insulin glargine  60 Units Subcutaneous BID  . pantoprazole  40 mg Oral Q1200  . potassium chloride SA  20 mEq Oral Daily  . rosuvastatin  5 mg Oral Daily  . vitamin B-12  1,000 mcg Oral Daily    OBJECTIVE  Filed Vitals:   02/18/12 0000 02/18/12 0400 02/18/12 0500 02/18/12 0725  BP: 120/57 99/46  95/43  Pulse: 92 86    Temp: 99.6 F (37.6 C) 99.5 F (37.5 C)  98.9 F (37.2 C)  TempSrc: Oral Oral  Oral  Resp: 22 24  20   Height:      Weight:   175 lb 7.8 oz (79.6 kg)   SpO2: 95% 98%  93%    Intake/Output Summary (Last 24 hours) at 02/18/12 1120 Last data filed at 02/18/12 1100  Gross per 24 hour  Intake   1120 ml  Output   1500 ml  Net   -380 ml   Filed Weights   02/16/12 0500 02/17/12 0500 02/18/12 0500  Weight: 176 lb 12.9 oz (80.2 kg) 176 lb 2.4 oz (79.9 kg) 175 lb 7.8 oz (79.6 kg)    PHYSICAL EXAM  General: Pleasant, NAD. Chronically ill-appearing Neuro: Alert and oriented X 3. Moves all extremities spontaneously. Psych: Normal affect. HEENT:  Normal  Neck: Supple without bruits or JVD. Lungs:  Resp regular and unlabored, CTA. Heart: RRR no s3, s4, or murmurs. Abdomen: Soft, non-tender, non-distended, BS + x 4.  Extremities: No clubbing, cyanosis or edema.  DP/PT/Radials 2+ and equal bilaterally.  Accessory Clinical Findings  CBC  Basename 02/18/12 0618 02/17/12 0455  WBC 5.4 7.2  NEUTROABS -- --  HGB 11.3* 11.7*  HCT 33.4* 34.9*  MCV 87.9 87.5  PLT 143* Q000111Q*   Basic Metabolic Panel  Basename [id number] 0618 02/17/12 0455  NA 139 133*  K 4.2 3.8  CL 99 95*  CO2 29 29  GLUCOSE 183* 172*  BUN 38* 37*  CREATININE 1.35* 1.39*  CALCIUM 9.6 9.0  MG -- --  PHOS -- --   Liver Function Tests No results found for this basename: AST:2,ALT:2,ALKPHOS:2,BILITOT:2,PROT:2,ALBUMIN:2 in the last 72 hours No results found for this basename: LIPASE:2,AMYLASE:2 in the last 72 hours Cardiac Enzymes  Basename 02/16/12 1428 02/16/12 0907  CKTOTAL 808* 983*  CKMB 60.4* 76.6*  CKMBINDEX -- --  TROPONINI >20.00* >20.00*   BNP No components found with this basename: POCBNP:3 D-Dimer No results found for this basename: DDIMER:2 in the last 72 hours Hemoglobin A1C  Basename 02/16/12 0620  HGBA1C 9.8*   Fasting Lipid Panel  Basename 02/16/12 0620  CHOL  136  HDL 23*  LDLCALC 64  TRIG 243*  CHOLHDL 5.9  LDLDIRECT --   Thyroid Function Tests No results found for this basename: TSH,T4TOTAL,FREET3,T3FREE,THYROIDAB in the last 72 hours  TELE  Normal sinus rhythm, ventricular pacing  ECG    Radiology/Studies  No results found.  ASSESSMENT AND PLAN     She is better today with diuresis. Continue with current medical program today and recheck electrolytes and renal function morning. Cardiac enzymes trending down. She clearly had a nondistended. I would favor medical therapy at this point with her multiple comorbidities. We'll discuss with team on Monday. She's been a patient of Dr. [name].  Signed, Jenell Milliner MD

## 2012-02-19 DIAGNOSIS — I214 Non-ST elevation (NSTEMI) myocardial infarction: Secondary | ICD-10-CM | POA: Diagnosis present

## 2012-02-19 LAB — BASIC METABOLIC PANEL
BUN: 32 mg/dL — ABNORMAL HIGH (ref 6–23)
CO2: 31 mEq/L (ref 19–32)
Chloride: 96 mEq/L (ref 96–112)
GFR calc Af Amer: 49 mL/min — ABNORMAL LOW (ref >90)
Potassium: 3.5 mEq/L (ref 3.5–5.1)

## 2012-02-19 LAB — GLUCOSE, CAPILLARY
Glucose-Capillary: 140 mg/dL — ABNORMAL HIGH (ref 70–99)
Glucose-Capillary: 144 mg/dL — ABNORMAL HIGH (ref 70–99)
Glucose-Capillary: 242 mg/dL — ABNORMAL HIGH (ref 70–99)
Glucose-Capillary: 188 mg/dL — ABNORMAL HIGH (ref 70–99)

## 2012-02-19 MED ORDER — CARVEDILOL 12.5 MG PO TABS
12.5000 mg | ORAL_TABLET | Freq: Two times a day (BID) | ORAL | Status: DC
  Administered 2012-02-19 – 2012-02-20 (×2): 12.5 mg via ORAL
  Filled 2012-02-19 (×4): qty 1

## 2012-02-19 MED ORDER — POTASSIUM CHLORIDE CRYS ER 20 MEQ PO TBCR
40.0000 meq | EXTENDED_RELEASE_TABLET | Freq: Every day | ORAL | Status: DC
  Administered 2012-02-19 – 2012-02-20 (×2): 40 meq via ORAL
  Filled 2012-02-19: qty 2

## 2012-02-19 NOTE — Progress Notes (Signed)
Patient ID: FELICA COWARD, female   DOB: [birth year], 78 y.o.   MRN: [id number]   Patient Name: LOLITTA HANAS Date of Encounter: 02/19/2012    SUBJECTIVE  No further chest discomfort. Shortness of breath better. She diureses very well yesterday. Her numbers are stable.  CURRENT MEDS    . ALPRAZolam  0.5 mg Oral QHS  . aspirin  325 mg Oral Daily  . carvedilol  6.25 mg Oral BID WC  . cephALEXin  500 mg Oral Q6H  . citalopram  10 mg Oral Daily  . clopidogrel  75 mg Oral Q breakfast  . dicyclomine  10 mg Oral q morning - 123XX123  . folic acid  0.5 mg Oral Daily  . furosemide  80 mg Oral BID  . heparin  5,000 Units Subcutaneous Q8H  . insulin aspart  0-15 Units Subcutaneous TID WC  . insulin aspart  0-5 Units Subcutaneous QHS  . insulin glargine  60 Units Subcutaneous BID  . pantoprazole  40 mg Oral Q1200  . potassium chloride SA  40 mEq Oral Daily  . rosuvastatin  5 mg Oral Daily  . vitamin B-12  1,000 mcg Oral Daily  . DISCONTD: potassium chloride SA  20 mEq Oral Daily    OBJECTIVE  Filed Vitals:   02/19/12 0000 02/19/12 0404 02/19/12 0500 02/19/12 0841  BP: 153/25 103/46    Pulse: 94 80    Temp: 98.8 F (37.1 C) 98.7 F (37.1 C)  98.3 F (36.8 C)  TempSrc: Oral Oral  Oral  Resp: 20 18    Height:      Weight:   175 lb 7.8 oz (79.6 kg)   SpO2: 98% 98%      Intake/Output Summary (Last 24 hours) at 02/19/12 0933 Last data filed at 02/19/12 0000  Gross per 24 hour  Intake    480 ml  Output   2150 ml  Net  -1670 ml   Filed Weights   02/17/12 0500 02/18/12 0500 02/19/12 0500  Weight: 176 lb 2.4 oz (79.9 kg) 175 lb 7.8 oz (79.6 kg) 175 lb 7.8 oz (79.6 kg)    PHYSICAL EXAM  General: Pleasant, NAD. Chronically ill Neuro: Alert and oriented X 3. Moves all extremities spontaneously. Psych: Normal affect. HEENT:  Normal  Neck: Supple without bruits or JVD. Lungs:  Resp regular and unlabored, CTA. Heart: RRR no s3, s4, or murmurs. Abdomen: Soft,  non-tender, non-distended, BS + x 4.  Extremities: No clubbing, cyanosis or edema. DP/PT/Radials 2+ and equal bilaterally.  Accessory Clinical Findings  CBC  Basename 02/18/12 0618 02/17/12 0455  WBC 5.4 7.2  NEUTROABS -- --  HGB 11.3* 11.7*  HCT 33.4* 34.9*  MCV 87.9 87.5  PLT 143* Q000111Q*   Basic Metabolic Panel  Basename [id number] 02/18/12 0618  NA 137 139  K 3.5 4.2  CL 96 99  CO2 31 29  GLUCOSE 78 183*  BUN 32* 38*  CREATININE 1.22* 1.35*  CALCIUM 9.3 9.6  MG -- --  PHOS -- --   Liver Function Tests No results found for this basename: AST:2,ALT:2,ALKPHOS:2,BILITOT:2,PROT:2,ALBUMIN:2 in the last 72 hours No results found for this basename: LIPASE:2,AMYLASE:2 in the last 72 hours Cardiac Enzymes  Basename 02/16/12 1428  CKTOTAL 808*  CKMB 60.4*  CKMBINDEX --  TROPONINI >20.00*   BNP No components found with this basename: POCBNP:3 D-Dimer No results found for this basename: DDIMER:2 in the last 72 hours Hemoglobin A1C No results found for this basename: HGBA1C  in the last 72 hours Fasting Lipid Panel No results found for this basename: CHOL,HDL,LDLCALC,TRIG,CHOLHDL,LDLDIRECT in the last 72 hours Thyroid Function Tests No results found for this basename: TSH,T4TOTAL,FREET3,T3FREE,THYROIDAB in the last 72 hours  TELE  Normal sinus rhythm, the pacing  ECG    Radiology/Studies  No results found.  ASSESSMENT AND PLAN     She is stable with improvement with diuresis. She clearly had a NSTEMI. We'll ambulate him unit to see if she has any additional chest discomfort. If not I would favor medical therapy at this point. I'll asked Dr. Jackalyn Lombard opinion and others in the morning. Discussed with patient and nurse.  Signed, Jenell Milliner MD

## 2012-02-20 ENCOUNTER — Encounter (HOSPITAL_COMMUNITY): Payer: Self-pay | Admitting: Cardiology

## 2012-02-20 LAB — BASIC METABOLIC PANEL WITH GFR
BUN: 29 mg/dL — ABNORMAL HIGH (ref 6–23)
CO2: 33 meq/L — ABNORMAL HIGH (ref 19–32)
Calcium: 9.8 mg/dL (ref 8.4–10.5)
Chloride: 100 meq/L (ref 96–112)
Creatinine, Ser: 1.3 mg/dL — ABNORMAL HIGH (ref 0.50–1.10)
GFR calc Af Amer: 45 mL/min — ABNORMAL LOW
GFR calc non Af Amer: 39 mL/min — ABNORMAL LOW
Glucose, Bld: 69 mg/dL — ABNORMAL LOW (ref 70–99)
Potassium: 3.8 meq/L (ref 3.5–5.1)
Sodium: 140 meq/L (ref 135–145)

## 2012-02-20 LAB — GLUCOSE, CAPILLARY: Glucose-Capillary: 194 mg/dL — ABNORMAL HIGH (ref 70–99)

## 2012-02-20 MED ORDER — CARVEDILOL 12.5 MG PO TABS: 12.5000 mg | ORAL_TABLET | Freq: Two times a day (BID) | ORAL | Status: DC

## 2012-02-20 MED ORDER — ROSUVASTATIN CALCIUM 5 MG PO TABS: 5.0000 mg | ORAL_TABLET | Freq: Every day | ORAL | Status: DC

## 2012-02-20 MED ORDER — NITROGLYCERIN 0.4 MG SL SUBL: 0.4000 mg | SUBLINGUAL_TABLET | SUBLINGUAL | Status: AC | PRN

## 2012-02-20 MED ORDER — POTASSIUM CHLORIDE CRYS ER 20 MEQ PO TBCR: 40.0000 meq | EXTENDED_RELEASE_TABLET | Freq: Every day | ORAL | Status: DC

## 2012-02-20 MED ORDER — CEPHALEXIN 500 MG PO CAPS: 500.0000 mg | ORAL_CAPSULE | Freq: Four times a day (QID) | ORAL | Status: AC

## 2012-02-20 MED ORDER — PANTOPRAZOLE SODIUM 40 MG PO TBEC: 40.0000 mg | DELAYED_RELEASE_TABLET | Freq: Every day | ORAL | Status: DC

## 2012-02-20 MED ORDER — INSULIN GLARGINE 100 UNIT/ML ~~LOC~~ SOLN: 60.0000 [IU] | Freq: Two times a day (BID) | SUBCUTANEOUS | Status: DC

## 2012-02-20 MED ORDER — CLOPIDOGREL BISULFATE 75 MG PO TABS: 75.0000 mg | ORAL_TABLET | Freq: Every day | ORAL | Status: DC

## 2012-02-20 MED ORDER — ASPIRIN 81 MG PO TABS: 81.0000 mg | ORAL_TABLET | Freq: Every day | ORAL | Status: DC

## 2012-02-20 NOTE — Progress Notes (Signed)
Nursing 1547 Patient has MD orders for discharge to home.  Vital signs stable, patient voiding and ambulating adequately for discharge. PIVs removed.   Discharge instructions explained and understood. Medication reconciliation reviewed and complete, new medications explained and understood, prescriptions sent by PA. Care plan and patient education completed.  Patient discharged at 1547.

## 2012-02-20 NOTE — Discharge Summary (Signed)
Discharge Summary   Patient ID: [name] MRN: [id number], DOB/AGE: 76-26-1935 76 y.o.  Primary MD: Deloria Lair, MD Primary Cardiologist: Dr. DeGent/Dr. Rayann Heman (EP) Admit date: 02/15/2012 D/C date:     02/20/2012      Primary Discharge Diagnoses:  1. NSTEMI/CAD  - H/o PCI to RCA 2006, BMS to RCA and LAD 05/2009  - Cath 02/15/12 revealed RCA as culprit lesion w/ occlusion at site of prior stent, patent LAD stent, chronic LCx and Diagonal dz; Medical therapy given late presentation and resolution of chest pain  - Initiated on plavix and statin  - No long acting nitrate due to low BP, reassess at follow up  2. Ischemic Cardiomyopathy  - s/p Medtronic BiV ICD 2006   3. Acute on Chronic Systolic and Diastolic CHF  - Echo revealed EF 30-35%, inf, post, inferoseptal akinesis, grade 2 diastolic dysfunction, mod reduced RV systolic fxn  - Improved with diuresis  - No ACEI due to low BP and #4, reassess at follow up  - Discharge weight 174lbs  4. Acute on Chronic Renal Insufficiency  - In the setting of diuresis  - ACEI stopped  - Baseline Crt ~1.2; Crt 1.3 at DC  - F/u BMET  5. Hyperlipidemia  - H/o GI intolerance on Lipitor  - Initiated on Crestor, will need f/u lipids/LFTs 6-8wks  6. Diabetes Mellitus, Type 2, Uncontrolled  - A1C 9.8  - Follow up with PCP  7. Left Leg Cellulitis  - Keflex initiated on 02/14/12, will complete 10 day course   Secondary Discharge Diagnoses:  1. H/o tobacco abuse 2. H/o Apical aneurysm treated with coumadin 3. GERD 4. Left shoulder impingement syndrome 5. Peripheral Neuropathy 6. Depression   Allergies Allergies  Allergen Reactions  . Metformin   . Sulfonamide Derivatives     Diagnostic Studies/Procedures:   02/15/12 - Cardiac Cath  Hemodynamics:  AO 94/56 with a mean of 72 mmHg  Coronary angiography:  Coronary dominance: right  Left mainstem: There is moderate calcified disease in the distal left main up to 40%.   Left anterior descending (LAD): There is a 50% ostial stenosis in the LAD. The stent in the proximal LAD is widely patent with diffuse 20-30% in-stent disease. The second diagonal branch has a 90% stenosis at its origin.  Left circumflex (LCx): The left circumflex coronary is occluded in the mid vessel and reconstituted by faint left to left collaterals.  Right coronary artery (RCA): The right coronary is occluded proximally at the site of prior stent.  Left ventriculography: Left ventricular angiography was not performed.  Final Conclusions:  1. Three-vessel obstructive coronary disease. The culprit lesion is the right coronary which is occluded at the site of a prior stent. The stent in the proximal LAD is patent. The left circumflex occlusion is chronic. The diagonal disease is also chronic. The patient is now 18 hours out from her presentation and her chest pain is currently resolved.  Recommendations: I would recommend continued medical therapy at this point. We will assess her LV function by echocardiogram. I think there is little advantage at this point to try and reperfuse the right coronary given her very late presentation and improvement in her chest pain symptoms.   02/15/12 - 2D Echo  Study Conclusions: - Left ventricle: The cavity size was normal. Brock Mokry thickness was normal. Systolic function was moderately to severely reduced. The estimated ejection fraction was in the range of 30% to 35%. Inferior, posterior, and inferoseptal akinesis. Features are  consistent with a pseudonormal left ventricular filling pattern, with concomitant abnormal relaxation and increased filling pressure (grade 2 diastolic dysfunction). - Aortic valve: There was no stenosis. - Mitral valve: Mild regurgitation. - Right ventricle: The cavity size was normal. Pacer wire or catheter noted in right ventricle. Systolic function was moderately reduced. - Pulmonary arteries: PA peak pressure: 65mm Hg (S). - Systemic  veins: IVC 1.8 cm with some respirophasic variation, suggesting RA pressure 10 mmHg. Impressions: Findings consistent with large inferoposterior MI with RV involvement. EF 30-35%.    History of Present Illness: 78 y.o. female w/ the above medical problems who transferred [facility] to [facility] on 02/15/12 for NSTEMI.  Hospital Course: The patient presented to [facility] in stable condition and underwent cardiac cath revealing occlusion of RCA at site of prior stent, patent LAD stent, and chronic disease of LCx and Diagonal. She tolerated the procedure well without complications. Due to her late presentation and resolution of chest pain recommendations were made for medical therapy. She was continued on ASA and BB and placed on Plavix and statin. Attempted to initiate ACEI, but this was stopped due to low BP and worsening renal function. Long-acting nitrate was not initiated due to low BP. Initiation of these can be considered at follow up visit.   Echo revealed EF 30-35%, inf, post, inferoseptal akinesis, grade 2 diastolic dysfunction, and mod reduced RV systolic fxn. She was mildly volume overloaded on exam for which she was gently diuresed with IV lasix with close monitoring of renal function and electrolytes. Her volume status improved and oxygen was weaned. Lasix was transitioned to home oral dosing. Renal function improved with Crt 1.3 on day of discharge.   On 02/14/12 she was initiated on Keflex for left leg cellulitis and this was continued with plans for her to complete 10day course. Cath site remained stable. She was able to ambulate without chest pain or sob. She was seen and evaluated by Dr. Verl Blalock who felt she was stable for discharge home with plans for follow up as scheduled below.  Discharge Vitals: Blood pressure 106/59, pulse 80, temperature 98.4 F (36.9 C), temperature source Oral, resp. rate 16, height 5\' 6"  (1.676 m), weight 174 lb 13.2 oz (79.3 kg),  SpO2 96.00%.  Labs: Component Value Date   WBC 5.4 02/18/2012   HGB 11.3* 02/18/2012   HCT 33.4* 02/18/2012   MCV 87.9 02/18/2012   PLT 143* 02/18/2012    Lab 02/20/12 0555  NA 140  K 3.8  CL 100  CO2 33*  BUN 29*  CREATININE 1.30*  CALCIUM 9.8  GLUCOSE 69*   Component Value Date   CHOL 136 02/16/2012   HDL 23* 02/16/2012   LDLCALC 64 02/16/2012   TRIG 243* 02/16/2012     02/16/2012 09:07 02/16/2012 14:28  CK, MB 76.6 (HH) 60.4 (HH)  CK Total 983 (H) 808 (H)  Troponin I >20.00 (HH) >20.00 (HH)     02/16/2012 06:20  Hemoglobin A1C 9.8 (H)    Discharge Medications   Medication List  As of 02/20/2012  3:42 PM   STOP taking these medications         omeprazole 20 MG tablet         TAKE these medications         ALPRAZolam 0.5 MG tablet   Commonly known as: XANAX   Take 1 tablet by mouth daily as needed.      aspirin 81 MG tablet  Take 1 tablet (81 mg total) by mouth daily. Take 1 tablet by mouth daily.      carvedilol 12.5 MG tablet   Commonly known as: COREG   Take 1 tablet (12.5 mg total) by mouth 2 (two) times daily with a meal.      cephALEXin 500 MG capsule   Commonly known as: KEFLEX   Take 1 capsule (500 mg total) by mouth every 6 (six) hours.      citalopram 10 MG tablet   Commonly known as: CELEXA   Take 10 mg by mouth daily.      clopidogrel 75 MG tablet   Commonly known as: PLAVIX   Take 1 tablet (75 mg total) by mouth daily with breakfast.      dicyclomine 10 MG capsule   Commonly known as: BENTYL   Take 10 mg by mouth 2 (two) times daily as needed.      folic acid A999333 MCG tablet   Commonly known as: FOLVITE   Take 400 mcg by mouth daily.      furosemide 80 MG tablet   Commonly known as: LASIX   Take 1 tablet (80 mg total) by mouth 2 (two) times daily.      insulin glargine 100 UNIT/ML injection   Commonly known as: LANTUS   Inject 60 Units into the skin 2 (two) times daily.      loratadine 10 MG tablet   Commonly known as: CLARITIN    Take 10 mg by mouth as needed.      nitroGLYCERIN 0.4 MG SL tablet   Commonly known as: NITROSTAT   Place 1 tablet (0.4 mg total) under the tongue every 5 (five) minutes as needed for chest pain (up to 3 doses).      pantoprazole 40 MG tablet   Commonly known as: PROTONIX   Take 1 tablet (40 mg total) by mouth daily at 12 noon.      potassium chloride SA 20 MEQ tablet   Commonly known as: K-DUR,KLOR-CON   Take 2 tablets (40 mEq total) by mouth daily.      rosuvastatin 5 MG tablet   Commonly known as: CRESTOR   Take 1 tablet (5 mg total) by mouth daily.      traMADol 50 MG tablet   Commonly known as: ULTRAM   Take 1-2 tablets by mouth every 4-6 hours as needed pain.      vitamin B-12 1000 MCG tablet   Commonly known as: CYANOCOBALAMIN   Take 1 tablet by mouth daily.            Disposition   Discharge Orders    Future Appointments: Provider: Department: Dept Phone: Center:   03/01/2012 2:20 PM Aurora Mask, PA [facility] [phone] [facility]   03/26/2012 9:40 AM [facility] [address] [phone] [facility]     Future Orders Please Complete By Expires   Diet - low sodium heart healthy      Increase activity slowly      Discharge instructions      Comments:   * KEEP WRIST CATHETERIZATION SITE CLEAN AND DRY. Call the office for any signs of bleeding, pus, swelling, increased pain, or any other concerns. * NO HEAVY LIFTING (>10lbs) X 9 DAYS. * NO SEXUAL ACTIVITY X 9 DAYS. * NO DRIVING X 2 DAYS * NO SOAKING BATHS, HOT TUBS, POOLS, ETC., X 2 DAYS.  * Please have blood work drawn (BMET) on 02/27/12 and have the results sent to  [name], PA-C at [facility] in [place].  * You were started on a cholesterol medication (Crestor) this hospitalization and will need follow up blood work (lipid panel and liver function tests) in 6-8wks with your primary care provider.  * Your A1C was 9.8. Please follow up with your primary care  provider.  * Nexium was changed to Protonix due to it's interaction with Plavix  * Please finish taking your antibiotic (Keflex) at home through Thursday 02/23/12     Follow-up Information    Follow up with Aurora Mask, PA on 03/01/2012. (2:20)    Contact information:   [facility] [address] [place] [phone]       Follow up with Deloria Lair, MD. (As needed)    Contact information:   [address] [place] [phone]           Outstanding Labs/Studies:  1. BMET 2. Patient will require follow up lipid panel and liver function tests in 6-8 weeks as we initiated a new statin during this hospitalization.   Duration of Discharge Encounter: Greater than 30 minutes including physician and PA time.  Signed, HOPE, JESSICA PA-C 02/20/2012, 3:42 PM  Marijo Conception. Verl Blalock, MD, [place] Pager:  [phone]

## 2012-02-20 NOTE — Progress Notes (Signed)
Patient ID: DOMONIQUE NAKAHARA, female   DOB: 1934/06/15, 78 y.o.   MRN: [id number]   Patient Name: [name] Date of Encounter: 02/20/2012    SUBJECTIVE  No chest pain with ambulation yesterday. Cardiac catheterization reviewed with Dr. Martinique and we will go with medical therapy.  CURRENT MEDS    . ALPRAZolam  0.5 mg Oral QHS  . aspirin  325 mg Oral Daily  . carvedilol  12.5 mg Oral BID WC  . cephALEXin  500 mg Oral Q6H  . citalopram  10 mg Oral Daily  . clopidogrel  75 mg Oral Q breakfast  . dicyclomine  10 mg Oral q morning - [id number]  . folic acid  0.5 mg Oral Daily  . furosemide  80 mg Oral BID  . heparin  5,000 Units Subcutaneous Q8H  . insulin aspart  0-15 Units Subcutaneous TID WC  . insulin aspart  0-5 Units Subcutaneous QHS  . insulin glargine  60 Units Subcutaneous BID  . pantoprazole  40 mg Oral Q1200  . potassium chloride SA  40 mEq Oral Daily  . rosuvastatin  5 mg Oral Daily  . vitamin B-12  1,000 mcg Oral Daily  . DISCONTD: carvedilol  6.25 mg Oral BID WC  . DISCONTD: potassium chloride SA  20 mEq Oral Daily    OBJECTIVE  Filed Vitals:   02/19/12 1935 02/19/12 2314 02/20/12 0319 02/20/12 0500  BP: 98/55  101/58   Pulse:      Temp:  97.7 F (36.5 C) 98.8 F (37.1 C)   TempSrc:  Oral Oral   Resp: 16  20   Height:      Weight:    174 lb 13.2 oz (79.3 kg)  SpO2: 96% 96% 97%     Intake/Output Summary (Last 24 hours) at 02/20/12 0757 Last data filed at 02/20/12 0600  Gross per 24 hour  Intake    900 ml  Output   2650 ml  Net  -1750 ml   Filed Weights   02/18/12 0500 02/19/12 0500 02/20/12 0500  Weight: 175 lb 7.8 oz (79.6 kg) 175 lb 7.8 oz (79.6 kg) 174 lb 13.2 oz (79.3 kg)    PHYSICAL EXAM  General: Pleasant, NAD. Neuro: Alert and oriented X 3. Moves all extremities spontaneously. Psych: Normal affect. HEENT:  Normal  Neck: Supple without bruits or JVD. Lungs:  Resp regular and unlabored, CTA. Heart: RRR no s3, s4, or  murmurs. Abdomen: Soft, non-tender, non-distended, BS + x 4.  Extremities: No clubbing, cyanosis or edema. DP/PT/Radials 2+ and equal bilaterally.  Accessory Clinical Findings  CBC  Basename 02/18/12 0618  WBC 5.4  NEUTROABS --  HGB 11.3*  HCT 33.4*  MCV 87.9  PLT [id number]*   Basic Metabolic Panel  Basename [id number] 0555 02/19/12 0525  NA 140 137  K 3.8 3.5  CL 100 96  CO2 33* 31  GLUCOSE 69* 78  BUN 29* 32*  CREATININE 1.30* 1.22*  CALCIUM 9.8 9.3  MG -- --  PHOS -- --   Liver Function Tests No results found for this basename: AST:2,ALT:2,ALKPHOS:2,BILITOT:2,PROT:2,ALBUMIN:2 in the last 72 hours No results found for this basename: LIPASE:2,AMYLASE:2 in the last 72 hours Cardiac Enzymes No results found for this basename: CKTOTAL:3,CKMB:3,CKMBINDEX:3,TROPONINI:3 in the last 72 hours BNP No components found with this basename: POCBNP:3 D-Dimer No results found for this basename: DDIMER:2 in the last 72 hours Hemoglobin A1C No results found for this basename: HGBA1C in the last 72 hours Fasting Lipid  Panel No results found for this basename: CHOL,HDL,LDLCALC,TRIG,CHOLHDL,LDLDIRECT in the last 72 hours Thyroid Function Tests No results found for this basename: TSH,T4TOTAL,FREET3,T3FREE,THYROIDAB in the last 72 hours  TELE  Normal sinus rhythm, biventricular pacing  ECG    Radiology/Studies  No results found.  ASSESSMENT AND PLAN  Active Problems:  NSTEMI (non-ST elevated myocardial infarction)    We will plan on discharge today with medical therapy. Discharge around the afternoon. She will need close followup in [place] within the next 2 weeks. She also needs prescription for sublingual nitroglycerin. Blood pressure soft so we'll not add additional therapy such as nitrates. Low-dose ACE in the future would be indicated if pressure will allow. Finish out 10 day course of Keflex for cellulitis. Signed, Jenell Milliner MD

## 2012-03-01 ENCOUNTER — Encounter: Payer: Self-pay | Admitting: Physician Assistant

## 2012-03-01 ENCOUNTER — Ambulatory Visit (INDEPENDENT_AMBULATORY_CARE_PROVIDER_SITE_OTHER): Payer: Medicare Other | Admitting: Physician Assistant

## 2012-03-01 VITALS — BP 102/59 | HR 66 | Ht 66.0 in | Wt 174.8 lb

## 2012-03-01 DIAGNOSIS — E785 Hyperlipidemia, unspecified: Secondary | ICD-10-CM

## 2012-03-01 DIAGNOSIS — I2510 Atherosclerotic heart disease of native coronary artery without angina pectoris: Secondary | ICD-10-CM

## 2012-03-01 DIAGNOSIS — E119 Type 2 diabetes mellitus without complications: Secondary | ICD-10-CM | POA: Insufficient documentation

## 2012-03-01 DIAGNOSIS — Z95810 Presence of automatic (implantable) cardiac defibrillator: Secondary | ICD-10-CM

## 2012-03-01 DIAGNOSIS — E782 Mixed hyperlipidemia: Secondary | ICD-10-CM

## 2012-03-01 DIAGNOSIS — I5022 Chronic systolic (congestive) heart failure: Secondary | ICD-10-CM

## 2012-03-01 DIAGNOSIS — R0602 Shortness of breath: Secondary | ICD-10-CM

## 2012-03-01 DIAGNOSIS — I214 Non-ST elevation (NSTEMI) myocardial infarction: Secondary | ICD-10-CM

## 2012-03-01 NOTE — Progress Notes (Signed)
Primary Cardiologist: Terald Sleeper, MD   HPI: Post hospital followup from [facility], following initial presentation to [facility] with NST EMI (troponin 4.1). EKG uninterpretable, secondary to paced rhythm.   - Cardiac catheterization: Three-vessel CAD, occluded RCA stent (culprit lesion); patent proximal LAD stent; chronic 100% CFX; chronic 90% ostial DX2; LVG deferred (CKD). Medical therapy recommended.   - 2-D echo: EF 30-35%; moderate RVD, grade 2 diastolic dysfunction, mild MR  Medication adjustments notable for initiation of ACE inhibitor, but subsequent discontinuation secondary to hypotension/RI exacerbation. Gently diuresed with IV Lasix, for mild volume overload. Creatinine 1.3 at discharge. Treated with 10 day course Keflex, for LLE cellulitis.  Patient has since been seen by Dr. Scotty Court, particularly for close monitoring of uncontrolled DM (A1c 9.8).   - Followup labs, September 2: Potassium 5.0, BUN 28, creatinine 1.5, PCO2 31, glucose 340  Patient denies any recurrent CP. She has chronic DOE, but no orthopnea, PND, or LE edema. She has gained 1 pound, since last OV.  Allergies  Allergen Reactions  . Metformin   . Sulfonamide Derivatives     Current Outpatient Prescriptions  Medication Sig Dispense Refill  . ALPRAZolam (XANAX) 0.5 MG tablet Take 0.5 mg by mouth at bedtime as needed.       Marland Kitchen aspirin 81 MG tablet Take 81 mg by mouth daily.      . carvedilol (COREG) 12.5 MG tablet Take 1 tablet (12.5 mg total) by mouth 2 (two) times daily with a meal.  60 tablet  6  . citalopram (CELEXA) 10 MG tablet Take 10 mg by mouth daily.      . clopidogrel (PLAVIX) 75 MG tablet Take 1 tablet (75 mg total) by mouth daily with breakfast.  30 tablet  6  . dicyclomine (BENTYL) 10 MG capsule Take 10 mg by mouth 2 (two) times daily as needed.       . folic acid (FOLVITE) A999333 MCG tablet Take 400 mcg by mouth daily.      . furosemide (LASIX) 80 MG tablet Take 1 tablet (80 mg total) by mouth 2 (two) times  daily.  60 tablet  6  . HYDROcodone-acetaminophen (NORCO/VICODIN) 5-325 MG per tablet Take 2 tablets by mouth every 6 (six) hours as needed.      . insulin glargine (LANTUS) 100 UNIT/ML injection Inject 60 Units into the skin 2 (two) times daily.      Marland Kitchen loratadine (CLARITIN) 10 MG tablet Take 10 mg by mouth as needed.       . pantoprazole (PROTONIX) 40 MG tablet Take 1 tablet (40 mg total) by mouth daily at 12 noon.  30 tablet  2  . potassium chloride SA (KLOR-CON M20) 20 MEQ tablet Take 2 tablets (40 mEq total) by mouth daily.  60 tablet  6  . rosuvastatin (CRESTOR) 5 MG tablet Take 1 tablet (5 mg total) by mouth daily.  30 tablet  2  . vitamin B-12 (CYANOCOBALAMIN) 1000 MCG tablet Take 1 tablet by mouth daily.       . nitroGLYCERIN (NITROSTAT) 0.4 MG SL tablet Place 1 tablet (0.4 mg total) under the tongue every 5 (five) minutes as needed for chest pain (up to 3 doses).  25 tablet  3    Past Medical History  Diagnosis Date  . Diabetes mellitus     IDDM  . Anxiety disorder   . GERD (gastroesophageal reflux disease)   . Hyperlipidemia     H/o GI intolerance to Lipitor  . Ischemic cardiomyopathy  s/p Medtronic BiV ICD 2006  . Systolic and diastolic CHF, chronic     EF 30-35% by echo 01/2012  . Impingement syndrome of left shoulder   . Aneurysm     Apical; previously on Coumadin  . Chronic renal insufficiency     Creatinine 1.17 January 2010  . Tobacco abuse     Discontinued  . Peripheral polyneuropathy     refractory to several medications  . Coronary artery disease     ath 02/15/12 revealed RCA as culprit lesion w/ occlusion at site of prior stent, patent LAD stent, chronic LCx and Diagonal dz; Medical therapy   . Biventricular implantable cardiac defibrillator in situ   . Depression     Past Surgical History  Procedure Date  . Abdominal hysterectomy   . Cardiac defibrillator placement 05/28/2009    Medtronic ICD placed by Dr. Caryl Comes secondary to ICM/CHF, [phone] Fidelis lead  fracture with ICD storm 2010 requiring new RV lead placement  . Insert / replace / remove pacemaker   . Coronary angioplasty   . Cholecystectomy   . Eye surgery     History   Social History  . Marital Status: Married    Spouse Name: N/A    Number of Children: N/A  . Years of Education: N/A   Occupational History  . Retired    Social History Main Topics  . Smoking status: Former Smoker -- 0.8 packs/day for 30 years    Types: Cigarettes    Quit date: 06/27/2002  . Smokeless tobacco: Never Used  . Alcohol Use: No  . Drug Use: Not on file  . Sexually Active: Not Currently   Other Topics Concern  . Not on file   Social History Narrative  . No narrative on file    Family History  Problem Relation Age of Onset  . Coronary artery disease Neg Hx     ROS: no nausea, vomiting; no fever, chills; no melena, hematochezia; no claudication  PHYSICAL EXAM: BP 102/59  Pulse 66  Ht 5\' 6"  (1.676 m)  Wt 174 lb 12.8 oz (79.289 kg)  BMI 28.21 kg/m2  SpO2 96% GENERAL: 78 year old female, obese, sitting upright; NAD HEENT: NCAT, PERRLA, EOMI; sclera clear; no xanthelasma NECK: palpable bilateral carotid pulses, no bruits; no JVD; no TM LUNGS: CTA bilaterally CARDIAC: RRR (S1, S2); no significant murmurs; no rubs or gallops ABDOMEN: soft, non-tender; intact BS EXTREMETIES: no significant peripheral edema SKIN: Warm and dry MUSCULOSKELETAL: no joint deformity NEURO: no focal deficit; NL affect   EKG: reviewed and available in Electronic Records   ASSESSMENT & PLAN:  NSTEMI (non-ST elevated myocardial infarction) Continue current medication regimen which includes low-dose aspirin, Plavix, and carvedilol. Unable to challenge with ACE inhibitor, secondary to persistent hypotension. Recommend continued close monitoring and early clinic followup in 2 months, with Dr. [name].  SYSTOLIC HEART FAILURE, CHRONIC Currently euvolemic by history and exam. Will repeat metabolic profile  and had a BNP level, for continued close monitoring of electrolytes and renal function, particularly in light of recent CRI exacerbation. Of note, I am hesitant to place her on Aldactone, in light of recent high normal potassium level.  HYPERLIPIDEMIA-MIXED Will check followup FLP/LFT profile in 8 weeks, following recent addition of Crestor. In-hospital LDL level 64.  Biventricular implantable cardiac defibrillator in situ Followup with Dr. Thompson Grayer of device clinic, as previously scheduled.  Diabetes mellitus Followed by primary M.D.    Gene Trevaun Rendleman, PAC

## 2012-03-01 NOTE — Assessment & Plan Note (Signed)
Will check followup FLP/LFT profile in 8 weeks, following recent addition of Crestor. In-hospital LDL level 64.

## 2012-03-01 NOTE — Assessment & Plan Note (Signed)
Followed by primary M.D. 

## 2012-03-01 NOTE — Patient Instructions (Addendum)
   Labs - fasting lipid and liver panel - do in 8 weeks (around November 5)  Office will contact with results  Labs - today for BMET, BNP  No added salt Follow up in  2 monts

## 2012-03-01 NOTE — Assessment & Plan Note (Signed)
Continue current medication regimen which includes low-dose aspirin, Plavix, and carvedilol. Unable to challenge with ACE inhibitor, secondary to persistent hypotension. Recommend continued close monitoring and early clinic followup in 2 months, with Dr. [name].

## 2012-03-01 NOTE — Assessment & Plan Note (Signed)
Followup with Dr. Thompson Grayer of device clinic, as previously scheduled.

## 2012-03-01 NOTE — Assessment & Plan Note (Signed)
Currently euvolemic by history and exam. Will repeat metabolic profile and had a BNP level, for continued close monitoring of electrolytes and renal function, particularly in light of recent CRI exacerbation. Of note, I am hesitant to place her on Aldactone, in light of recent high normal potassium level.

## 2012-03-01 NOTE — Progress Notes (Signed)
Patient will call back to schedule her 2 month follow up after deciding if she will stay with [facility] or transfer to Dr. Lutricia Feil.

## 2012-03-05 ENCOUNTER — Telehealth: Payer: Self-pay | Admitting: *Deleted

## 2012-03-05 DIAGNOSIS — R7989 Other specified abnormal findings of blood chemistry: Secondary | ICD-10-CM

## 2012-03-05 DIAGNOSIS — R0602 Shortness of breath: Secondary | ICD-10-CM

## 2012-03-05 MED ORDER — METOLAZONE 2.5 MG PO TABS: ORAL_TABLET | ORAL | Status: DC

## 2012-03-05 NOTE — Telephone Encounter (Signed)
Message copied by Laurine Blazer on Mon Mar 05, 2012  2:17 PM ------      Message from: Donney Dice      Created: Fri Mar 02, 2012  1:15 PM       Stable renal fxn, but BNP 400. Add Zaroxolyn 2.5 mg qM/W/F, x1 week only. BMET/BNP 1 week

## 2012-03-05 NOTE — Telephone Encounter (Signed)
Notes Recorded by Laurine Blazer, LPN on 624THL at 624THL PM Patient notified of below. New rx sent to [facility] & lab order faxed to [facility] today. Notes Recorded by Laurine Blazer, LPN on 624THL at 075-GRM AM Left message to return call.

## 2012-03-12 ENCOUNTER — Encounter: Payer: Self-pay | Admitting: *Deleted

## 2012-03-14 ENCOUNTER — Telehealth: Payer: Self-pay | Admitting: *Deleted

## 2012-03-14 DIAGNOSIS — R7989 Other specified abnormal findings of blood chemistry: Secondary | ICD-10-CM

## 2012-03-14 DIAGNOSIS — R0602 Shortness of breath: Secondary | ICD-10-CM

## 2012-03-14 DIAGNOSIS — Z79899 Other long term (current) drug therapy: Secondary | ICD-10-CM

## 2012-03-14 NOTE — Telephone Encounter (Signed)
Notes Recorded by Laurine Blazer, LPN on [id number] at 075-GRM PM Patient notified. Will fax lab order to [facility] today. She will do on 9/27.

## 2012-03-14 NOTE — Telephone Encounter (Signed)
Message copied by Laurine Blazer on Wed Mar 14, 2012  3:55 PM ------      Message from: Aurora Mask C      Created: Wed Mar 14, 2012  9:11 AM       BNP significantly improved. Continue current medication regimen, and repeat BMET/BNP in 10 days.

## 2012-03-18 DIAGNOSIS — R079 Chest pain, unspecified: Secondary | ICD-10-CM

## 2012-03-19 DIAGNOSIS — I214 Non-ST elevation (NSTEMI) myocardial infarction: Secondary | ICD-10-CM

## 2012-03-19 DIAGNOSIS — I509 Heart failure, unspecified: Secondary | ICD-10-CM

## 2012-03-20 DIAGNOSIS — I5023 Acute on chronic systolic (congestive) heart failure: Secondary | ICD-10-CM

## 2012-03-26 ENCOUNTER — Ambulatory Visit (INDEPENDENT_AMBULATORY_CARE_PROVIDER_SITE_OTHER): Payer: Medicare Other | Admitting: *Deleted

## 2012-03-26 ENCOUNTER — Encounter: Payer: Self-pay | Admitting: Internal Medicine

## 2012-03-26 DIAGNOSIS — I428 Other cardiomyopathies: Secondary | ICD-10-CM

## 2012-03-26 DIAGNOSIS — I5022 Chronic systolic (congestive) heart failure: Secondary | ICD-10-CM

## 2012-03-27 LAB — REMOTE ICD DEVICE
AL IMPEDENCE ICD: 475 Ohm
BATTERY VOLTAGE: 2.9787 V
BRDY-0003LV: 130 {beats}/min
BRDY-0004LV: 120 {beats}/min
LV LEAD THRESHOLD: 2.75 V
PACEART VT: 0
TOT-0002: 0
TZAT-0001ATACH: 1
TZAT-0001ATACH: 3
TZAT-0001FASTVT: 1
TZAT-0001SLOWVT: 1
TZAT-0001SLOWVT: 2
TZAT-0002ATACH: NEGATIVE
TZAT-0002ATACH: NEGATIVE
TZAT-0002FASTVT: NEGATIVE
TZAT-0005SLOWVT: 88 pct
TZAT-0005SLOWVT: 91 pct
TZAT-0012ATACH: 150 ms
TZAT-0012ATACH: 150 ms
TZAT-0012SLOWVT: 200 ms
TZAT-0012SLOWVT: 200 ms
TZAT-0013SLOWVT: 2
TZAT-0013SLOWVT: 2
TZAT-0018ATACH: NEGATIVE
TZAT-0018ATACH: NEGATIVE
TZAT-0018ATACH: NEGATIVE
TZAT-0018SLOWVT: NEGATIVE
TZAT-0018SLOWVT: NEGATIVE
TZAT-0019ATACH: 6 V
TZON-0003ATACH: 350 ms
TZON-0004SLOWVT: 28
TZON-0005SLOWVT: 12
TZST-0001ATACH: 4
TZST-0001ATACH: 5
TZST-0001ATACH: 6
TZST-0001FASTVT: 3
TZST-0001FASTVT: 4
TZST-0001SLOWVT: 3
TZST-0001SLOWVT: 4
TZST-0001SLOWVT: 6
TZST-0002ATACH: NEGATIVE
TZST-0002ATACH: NEGATIVE
TZST-0002FASTVT: NEGATIVE
TZST-0002FASTVT: NEGATIVE
TZST-0002FASTVT: NEGATIVE
TZST-0003SLOWVT: 25 J
TZST-0003SLOWVT: 35 J
VENTRICULAR PACING ICD: 99.66 pct
VF: 0

## 2012-03-27 NOTE — Progress Notes (Signed)
Remote defib check w/icm  

## 2012-04-02 ENCOUNTER — Ambulatory Visit (INDEPENDENT_AMBULATORY_CARE_PROVIDER_SITE_OTHER): Payer: Medicare Other | Admitting: Physician Assistant

## 2012-04-02 ENCOUNTER — Encounter: Payer: Self-pay | Admitting: Physician Assistant

## 2012-04-02 VITALS — BP 104/62 | HR 76 | Ht 66.0 in | Wt 176.0 lb

## 2012-04-02 DIAGNOSIS — N189 Chronic kidney disease, unspecified: Secondary | ICD-10-CM

## 2012-04-02 DIAGNOSIS — Z95810 Presence of automatic (implantable) cardiac defibrillator: Secondary | ICD-10-CM

## 2012-04-02 DIAGNOSIS — I5022 Chronic systolic (congestive) heart failure: Secondary | ICD-10-CM

## 2012-04-02 DIAGNOSIS — R0902 Hypoxemia: Secondary | ICD-10-CM | POA: Insufficient documentation

## 2012-04-02 DIAGNOSIS — N183 Chronic kidney disease, stage 3 unspecified: Secondary | ICD-10-CM | POA: Insufficient documentation

## 2012-04-02 DIAGNOSIS — I9589 Other hypotension: Secondary | ICD-10-CM

## 2012-04-02 NOTE — Assessment & Plan Note (Addendum)
Stable on current medication regimen. Unable to tolerate recent addition of Zaroxolyn. She also had to be taken off ACE inhibitor in recent past, secondary to CRI exacerbation. She also has recent history of Aldactone intolerance, secondary to mild hyperkalemia. I am currently unable to further uptitrate carvedilol. We'll continue to monitor closely.

## 2012-04-02 NOTE — Assessment & Plan Note (Addendum)
Will check followup labs. Of note, patient is intolerant of both ACE inhibitor and Aldactone, due to recent CRI exacerbation and mild hyperkalemia, respectively.

## 2012-04-02 NOTE — Progress Notes (Signed)
Primary Cardiologist: Terald Sleeper, MD   HPI: Patient seen as an add-on today for evaluation of worsening shortness of breath.  She was just discharged from [facility] (9/22-25/2013), following presentation with acute/chronic SH F. She was placed on a diuretic regimen of Lasix 80 IV twice a day, in addition to Zaroxolyn 2.5 every morning every Mon/Wed/Fri. Of note, I recently placed her on this medication as an outpatient, with noted improvement in her followup BNP level. Also, at time of discharge Dr. Domenic Polite recommended that her home dose of Lasix be increased to 120 every morning, and to continue on 80 every evening. She reports today that this was not done. She was, however, taken off Zaroxolyn, as recommended, secondary to exacerbation of chronic hypotension.  Clinically, she was doing well until about 2 days ago. She has been weighing herself daily, and compliant with dietary sodium restriction. Unfortunately, she reports a 5 pound weight gain over the last 48 hours, with associated worsening SOB, including orthopnea. Of note, she has since been placed on continuous supplemental oxygen, following an O2 sat level of 85% on room air, during most recent hospitalization.   Allergies  Allergen Reactions  . Metformin   . Sulfonamide Derivatives     Current Outpatient Prescriptions  Medication Sig Dispense Refill  . ALPRAZolam (XANAX) 0.5 MG tablet Take 0.5 mg by mouth at bedtime as needed.       Marland Kitchen aspirin 81 MG tablet Take 81 mg by mouth daily.      . carvedilol (COREG) 12.5 MG tablet Take 1 tablet (12.5 mg total) by mouth 2 (two) times daily with a meal.  60 tablet  6  . citalopram (CELEXA) 10 MG tablet Take 10 mg by mouth daily.      . clopidogrel (PLAVIX) 75 MG tablet Take 1 tablet (75 mg total) by mouth daily with breakfast.  30 tablet  6  . dicyclomine (BENTYL) 10 MG capsule Take 10 mg by mouth 2 (two) times daily as needed.       . folic acid (FOLVITE) A999333 MCG tablet Take 400 mcg by mouth  daily.      . furosemide (LASIX) 80 MG tablet Take 1 tablet (80 mg total) by mouth 2 (two) times daily.  60 tablet  6  . HYDROcodone-acetaminophen (LORCET) 10-650 MG per tablet Take 2 tablets by mouth every 6 (six) hours as needed.      . insulin glargine (LANTUS) 100 UNIT/ML injection Inject 60 Units into the skin 2 (two) times daily.      Marland Kitchen loratadine (CLARITIN) 10 MG tablet Take 10 mg by mouth as needed.       . nitroGLYCERIN (NITROSTAT) 0.4 MG SL tablet Place 1 tablet (0.4 mg total) under the tongue every 5 (five) minutes as needed for chest pain (up to 3 doses).  25 tablet  3  . pantoprazole (PROTONIX) 40 MG tablet Take 1 tablet (40 mg total) by mouth daily at 12 noon.  30 tablet  2  . potassium chloride SA (KLOR-CON M20) 20 MEQ tablet Take 2 tablets (40 mEq total) by mouth daily.  60 tablet  6  . rosuvastatin (CRESTOR) 5 MG tablet Take 1 tablet (5 mg total) by mouth daily.  30 tablet  2  . vitamin B-12 (CYANOCOBALAMIN) 1000 MCG tablet Take 1 tablet by mouth daily.         Past Medical History  Diagnosis Date  . Diabetes mellitus     IDDM  . Anxiety disorder   .  GERD (gastroesophageal reflux disease)   . Hyperlipidemia     H/o GI intolerance to Lipitor  . Ischemic cardiomyopathy     s/p Medtronic BiV ICD 2006  . Systolic and diastolic CHF, chronic     EF 30-35% by echo 01/2012  . Impingement syndrome of left shoulder   . Aneurysm     Apical; previously on Coumadin  . Chronic renal insufficiency     Creatinine 1.17 January 2010  . Tobacco abuse     Discontinued  . Peripheral polyneuropathy     refractory to several medications  . Coronary artery disease     ath 02/15/12 revealed RCA as culprit lesion w/ occlusion at site of prior stent, patent LAD stent, chronic LCx and Diagonal dz; Medical therapy   . Biventricular implantable cardiac defibrillator in situ   . Depression     Past Surgical History  Procedure Date  . Abdominal hysterectomy   . Cardiac defibrillator placement  05/28/2009    Medtronic ICD placed by Dr. Caryl Comes secondary to ICM/CHF, [phone] Fidelis lead fracture with ICD storm 2010 requiring new RV lead placement  . Insert / replace / remove pacemaker   . Coronary angioplasty   . Cholecystectomy   . Eye surgery     History   Social History  . Marital Status: Married    Spouse Name: N/A    Number of Children: N/A  . Years of Education: N/A   Occupational History  . Retired    Social History Main Topics  . Smoking status: Former Smoker -- 0.8 packs/day for 30 years    Types: Cigarettes    Quit date: 06/27/2002  . Smokeless tobacco: Never Used  . Alcohol Use: No  . Drug Use: Not on file  . Sexually Active: Not Currently   Other Topics Concern  . Not on file   Social History Narrative  . No narrative on file   Social History Narrative  . No narrative on file    Problem Relation Age of Onset  . Coronary artery disease Neg Hx     ROS: no nausea, vomiting; no fever, chills; no melena, hematochezia; no claudication  PHYSICAL EXAM: BP 104/62  Pulse 76  Ht 5\' 6"  (1.676 m)  Wt 176 lb (79.833 kg)  BMI 28.41 kg/m2 GENERAL: 78 year old female, obese, sitting upright, mildly dyspneic at rest ; NAD  HEENT: NCAT, PERRLA, EOMI; sclera clear; no xanthelasma; nasal cannula   NECK: palpable bilateral carotid pulses, no bruits; no JVD; no TM  LUNGS:  Bibasilar inspiratory crackles, with diminished breath sounds on left   CARDIAC: RRR (S1, S2); no significant murmurs; no rubs or gallops  ABDOMEN:  Protuberant   EXTREMETIES: no significant peripheral edema  SKIN: Warm and dry  MUSCULOSKELETAL: no joint deformity  NEURO: no focal deficit; NL affect   EKG:    ASSESSMENT & PLAN:  SYSTOLIC HEART FAILURE, CHRONIC Patient will be referred to the Short Stay center today to be treated aggressively with a one-time dose of Lasix 80 mg IV. This will be preceded by a metabolic panel and a BNP level. We'll then have her come back to see me in 48  hours, for reassessment of clinical status. Hopefully, we will be able to prevent her from being rehospitalized. If so, she will need to be followed closely in the outpatient setting.  Chronic hypotension Stable on current medication regimen. Unable to tolerate recent addition of Zaroxolyn. She also had to be taken off ACE inhibitor in  recent past, secondary to CRI exacerbation. She also has recent history of Aldactone intolerance, secondary to mild hyperkalemia. I am currently unable to further uptitrate carvedilol. We'll continue to monitor closely.  Biventricular implantable cardiac defibrillator in situ Followed by Dr. Thompson Grayer  Hypoxemia Patient has since been placed on continuous supplemental oxygen  Chronic kidney disease Will check followup labs. Of note, patient is intolerant of both ACE inhibitor and Aldactone, due to recent CRI exacerbation and mild hyperkalemia, respectively.    Gene Zynia Wojtowicz, PAC

## 2012-04-02 NOTE — Assessment & Plan Note (Signed)
Followed by Dr. Thompson Grayer

## 2012-04-02 NOTE — Assessment & Plan Note (Signed)
Patient will be referred to the Short Stay center today to be treated aggressively with a one-time dose of Lasix 80 mg IV. This will be preceded by a metabolic panel and a BNP level. We'll then have her come back to see me in 48 hours, for reassessment of clinical status. Hopefully, we will be able to prevent her from being rehospitalized. If so, she will need to be followed closely in the outpatient setting.

## 2012-04-02 NOTE — Assessment & Plan Note (Signed)
Patient has since been placed on continuous supplemental oxygen

## 2012-04-02 NOTE — Patient Instructions (Signed)
Continue all current medications. To Short Stay now for 80mg  IV Lasix  Labs:  BMET, BNP - prior to IV Lasix

## 2012-04-04 ENCOUNTER — Ambulatory Visit (INDEPENDENT_AMBULATORY_CARE_PROVIDER_SITE_OTHER): Payer: Medicare Other | Admitting: Physician Assistant

## 2012-04-04 ENCOUNTER — Encounter: Payer: Self-pay | Admitting: *Deleted

## 2012-04-04 ENCOUNTER — Encounter: Payer: Self-pay | Admitting: Cardiology

## 2012-04-04 ENCOUNTER — Other Ambulatory Visit: Payer: Self-pay | Admitting: Cardiology

## 2012-04-04 ENCOUNTER — Encounter: Payer: Self-pay | Admitting: Physician Assistant

## 2012-04-04 ENCOUNTER — Encounter: Payer: Medicare Other | Admitting: Physician Assistant

## 2012-04-04 VITALS — BP 120/60 | HR 82 | Ht 66.0 in | Wt 176.0 lb

## 2012-04-04 DIAGNOSIS — R0902 Hypoxemia: Secondary | ICD-10-CM

## 2012-04-04 DIAGNOSIS — N189 Chronic kidney disease, unspecified: Secondary | ICD-10-CM

## 2012-04-04 DIAGNOSIS — I9589 Other hypotension: Secondary | ICD-10-CM

## 2012-04-04 DIAGNOSIS — I5023 Acute on chronic systolic (congestive) heart failure: Secondary | ICD-10-CM

## 2012-04-04 DIAGNOSIS — Z95810 Presence of automatic (implantable) cardiac defibrillator: Secondary | ICD-10-CM

## 2012-04-04 MED ORDER — CITALOPRAM HYDROBROMIDE 10 MG PO TABS: 10.0000 mg | ORAL_TABLET | Freq: Every evening | ORAL | Status: DC

## 2012-04-04 NOTE — Assessment & Plan Note (Signed)
Renal function will need to be closely monitored.

## 2012-04-04 NOTE — Assessment & Plan Note (Signed)
This will have to be closely monitored. Patient has been unable to tolerate the recent addition of Zaroxolyn and ACE inhibitor, the latter secondary to CKD exacerbation.

## 2012-04-04 NOTE — Patient Instructions (Signed)
Admit to ICU

## 2012-04-04 NOTE — Progress Notes (Signed)
Primary Cardiologist: Terald Sleeper, MD   HPI: Patient returns for early scheduled followup, following an add-on visit with me 2 days ago. She presented with worsening SOB, having just been discharged from [facility] (9/22-25/2013), for treatment of acute/chronic SH F. She was also discharged on continuous home oxygen for treatment of documented hypoxemia (SaO2 85% room air).  When seen by me, she appeared to be in decompensated heart failure, and I recommended a one-time dose of IV Lasix 80 mg, to be administered in the [place]. I increased her a.m. Lasix dose to 120 every morning, while continuing the p.m. dose at 80 mg daily. Labs were obtained, showing improved renal function with BUN 24, creatinine 1.3 (1.4), and slight decrease in BNP to 330 (360).  Returns today, however, with no noted improvement. Weight is unchanged (176 pounds). She continues to have significant DOE with minimal exertion, such as walking on level ground. She is otherwise stable at rest.    Allergies  Allergen Reactions  . Metformin   . Sulfonamide Derivatives     Current Outpatient Prescriptions  Medication Sig Dispense Refill  . ALPRAZolam (XANAX) 0.5 MG tablet Take 0.5 mg by mouth at bedtime as needed.       Marland Kitchen aspirin 81 MG tablet Take 81 mg by mouth daily.      . carvedilol (COREG) 12.5 MG tablet Take 1 tablet (12.5 mg total) by mouth 2 (two) times daily with a meal.  60 tablet  6  . citalopram (CELEXA) 10 MG tablet Take 10 mg by mouth daily.      . clopidogrel (PLAVIX) 75 MG tablet Take 1 tablet (75 mg total) by mouth daily with breakfast.  30 tablet  6  . dicyclomine (BENTYL) 10 MG capsule Take 10 mg by mouth 2 (two) times daily as needed.       . folic acid (FOLVITE) A999333 MCG tablet Take 400 mcg by mouth daily.      . furosemide (LASIX) 80 MG tablet Take 1 tablet (80 mg total) by mouth 2 (two) times daily.  60 tablet  6  . HYDROcodone-acetaminophen (LORCET) 10-650 MG per tablet Take 1 tablet by mouth every 6  (six) hours as needed.       . insulin aspart (NOVOLOG) 100 UNIT/ML injection Inject 10 Units into the skin 3 (three) times daily before meals.      . insulin glargine (LANTUS) 100 UNIT/ML injection Inject 60 Units into the skin 2 (two) times daily.      Marland Kitchen loratadine (CLARITIN) 10 MG tablet Take 10 mg by mouth as needed.       . nitroGLYCERIN (NITROSTAT) 0.4 MG SL tablet Place 1 tablet (0.4 mg total) under the tongue every 5 (five) minutes as needed for chest pain (up to 3 doses).  25 tablet  3  . pantoprazole (PROTONIX) 40 MG tablet Take 1 tablet (40 mg total) by mouth daily at 12 noon.  30 tablet  2  . potassium chloride SA (KLOR-CON M20) 20 MEQ tablet Take 2 tablets (40 mEq total) by mouth daily.  60 tablet  6  . rosuvastatin (CRESTOR) 5 MG tablet Take 1 tablet (5 mg total) by mouth daily.  30 tablet  2  . vitamin B-12 (CYANOCOBALAMIN) 1000 MCG tablet Take 1 tablet by mouth daily.         Past Medical History  Diagnosis Date  . Diabetes mellitus     IDDM  . Anxiety disorder   . GERD (gastroesophageal  reflux disease)   . Hyperlipidemia     H/o GI intolerance to Lipitor  . Ischemic cardiomyopathy     s/p Medtronic BiV ICD 2006  . Systolic and diastolic CHF, chronic     EF 30-35% by echo 01/2012  . Impingement syndrome of left shoulder   . Aneurysm     Apical; previously on Coumadin  . Chronic renal insufficiency     Creatinine 1.17 January 2010  . Tobacco abuse     Discontinued  . Peripheral polyneuropathy     refractory to several medications  . Coronary artery disease     ath 02/15/12 revealed RCA as culprit lesion w/ occlusion at site of prior stent, patent LAD stent, chronic LCx and Diagonal dz; Medical therapy   . Biventricular implantable cardiac defibrillator in situ   . Depression     Past Surgical History  Procedure Date  . Abdominal hysterectomy   . Cardiac defibrillator placement 05/28/2009    Medtronic ICD placed by Dr. Caryl Comes secondary to ICM/CHF, [phone] Fidelis lead  fracture with ICD storm 2010 requiring new RV lead placement  . Insert / replace / remove pacemaker   . Coronary angioplasty   . Cholecystectomy   . Eye surgery     History   Social History  . Marital Status: Married    Spouse Name: N/A    Number of Children: N/A  . Years of Education: N/A   Occupational History  . Retired    Social History Main Topics  . Smoking status: Former Smoker -- 0.8 packs/day for 30 years    Types: Cigarettes    Quit date: 06/27/2002  . Smokeless tobacco: Never Used  . Alcohol Use: No  . Drug Use: Not on file  . Sexually Active: Not Currently   Other Topics Concern  . Not on file   Social History Narrative  . No narrative on file   Social History Narrative  . No narrative on file    Problem Relation Age of Onset  . Coronary artery disease Neg Hx     ROS: no nausea, vomiting; no fever, chills; no melena, hematochezia; no claudication  PHYSICAL EXAM: BP 120/60  Pulse 82  Ht 5\' 6"  (1.676 m)  Wt 176 lb (79.833 kg)  BMI 28.41 kg/m2  SpO2 91% GENERAL: 78 year old female, obese, sitting upright, mildly dyspneic at rest ; NAD  HEENT: NCAT, PERRLA, EOMI; sclera clear; no xanthelasma; nasal cannula  NECK: JVD noted on right at 90; no TM  LUNGS: Bibasilar inspiratory crackles, with diminished breath sounds on left  CARDIAC: RRR (S1, S2); no significant murmurs; no rubs or gallops  ABDOMEN: Protuberant  EXTREMETIES: no significant peripheral edema  SKIN: Warm and dry  MUSCULOSKELETAL: no joint deformity  NEURO: no focal deficit; NL affect   EKG:    ASSESSMENT & PLAN:  Acute on chronic systolic heart failure Patient has agreed with recommendation for direct admission to [facility] ICU unit, for more aggressive management of refractory NYHA class 3 heart failure. We placed her on IV Lasix regimen of 80 mg 3 times a day, with close monitoring of electrolytes and renal function. Given her history of ischemic cardiomyopathy,  status post recent NST EMI treated medically, we'll also rule out occult ischemia with serial cardiac markers. The patient is in agreement with this recommendation, and plan was discussed with, and approved by, Dr. Johnny Bridge.  Chronic hypotension This will have to be closely monitored. Patient has been unable to tolerate  the recent addition of Zaroxolyn and ACE inhibitor, the latter secondary to CKD exacerbation.  Hypoxemia Patient to remain on continuous supplemental oxygen, which was recently added following her most recent hospitalization here at [facility].  IMPLANTATION OF DEFIBRILLATOR, HX OF Followed by Dr. Thompson Grayer, here at the [facility] clinic.  Chronic kidney disease Renal function will need to be closely monitored.    Gene Cyril Railey, PAC

## 2012-04-04 NOTE — Assessment & Plan Note (Signed)
Followed by Dr. Thompson Grayer, here at the [facility] clinic.

## 2012-04-04 NOTE — Assessment & Plan Note (Signed)
Patient to remain on continuous supplemental oxygen, which was recently added following her most recent hospitalization here at [facility].

## 2012-04-04 NOTE — Assessment & Plan Note (Signed)
Patient has agreed with recommendation for direct admission to [facility] ICU unit, for more aggressive management of refractory NYHA class 3 heart failure. We placed her on IV Lasix regimen of 80 mg 3 times a day, with close monitoring of electrolytes and renal function. Given her history of ischemic cardiomyopathy, status post recent NST EMI treated medically, we'll also rule out occult ischemia with serial cardiac markers. The patient is in agreement with this recommendation.

## 2012-04-05 ENCOUNTER — Encounter: Payer: Self-pay | Admitting: Physician Assistant

## 2012-04-05 DIAGNOSIS — I9589 Other hypotension: Secondary | ICD-10-CM

## 2012-04-05 DIAGNOSIS — I5023 Acute on chronic systolic (congestive) heart failure: Secondary | ICD-10-CM

## 2012-04-06 DIAGNOSIS — I5023 Acute on chronic systolic (congestive) heart failure: Secondary | ICD-10-CM

## 2012-04-17 ENCOUNTER — Encounter: Payer: Self-pay | Admitting: Cardiology

## 2012-04-17 ENCOUNTER — Ambulatory Visit (INDEPENDENT_AMBULATORY_CARE_PROVIDER_SITE_OTHER): Payer: Medicare Other | Admitting: Cardiology

## 2012-04-17 VITALS — BP 101/49 | HR 64 | Ht 66.0 in | Wt 175.4 lb

## 2012-04-17 DIAGNOSIS — I429 Cardiomyopathy, unspecified: Secondary | ICD-10-CM

## 2012-04-17 DIAGNOSIS — I428 Other cardiomyopathies: Secondary | ICD-10-CM

## 2012-04-17 DIAGNOSIS — R0602 Shortness of breath: Secondary | ICD-10-CM

## 2012-04-17 DIAGNOSIS — I5022 Chronic systolic (congestive) heart failure: Secondary | ICD-10-CM

## 2012-04-17 DIAGNOSIS — I2510 Atherosclerotic heart disease of native coronary artery without angina pectoris: Secondary | ICD-10-CM

## 2012-04-17 MED ORDER — TORSEMIDE 20 MG PO TABS: 40.0000 mg | ORAL_TABLET | Freq: Every day | ORAL | Status: DC

## 2012-04-17 NOTE — Patient Instructions (Addendum)
Your physician recommends that you schedule a follow-up appointment in: 1 week with [name]. Your physician has recommended you make the following change in your medication: Stop furosemide and start torsemide 40 mg daily. Your new prescription has been sent to your pharmacy. All other medications will remain the same. Your physician recommends that you return for lab work in one week before your appointment around 04/24/12 at [facility] for BMET. Your physician recommends that you weigh, daily, at the same time every day, and in the same amount of clothing. Please record your daily weights on the handout provided and bring it to your next appointment. Your physician has requested that you have an echocardiogram. Echocardiography is a painless test that uses sound waves to create images of your heart. It provides your doctor with information about the size and shape of your heart and how well your heart's chambers and valves are working. This procedure takes approximately one hour. There are no restrictions for this procedure.

## 2012-04-17 NOTE — Assessment & Plan Note (Signed)
No reported angina. Last LVEF was 30-35%. In light of her continued symptoms, a followup echocardiogram will be obtained to ensure no change in valvular status or degree of pulmonary hypertension.

## 2012-04-17 NOTE — Progress Notes (Signed)
Clinical Summary [name] is a medically complex 78 y.o.female presenting for followup. This is my first meeting with her in the office, a former patient of Dr. [name]. She was seen recently in consultation at [facility], last office visit being in early October with Mr. Jacqualine Mau. She reports fatigue, dyspnea on exertion at NYHA class III. No angina or palpitations. States that she has been compliant with her medications and home oxygen. Weight has not changed since last visit. She states that she does not feel any better, generally worse since August of this year. She has been in and out of the hospital.  Lab work from October 13 reviewed showing BUN 36, creatinine 1.3, potassium 4.9. BNP from October 21 was 311. Echocardiogram in August of this year revealed LVEF 30-35% with grade 2 diastolic dysfunction.  Today we discussed her medications. She is on high-dose Lasix, did not tolerate metolazone previously. We discussed changing to Demadex to see if this is more effective. Otherwise her blood pressure and heart rate limit other titration at this point.   Allergies  Allergen Reactions  . Metformin   . Sulfonamide Derivatives     Current Outpatient Prescriptions  Medication Sig Dispense Refill  . albuterol (PROVENTIL HFA;VENTOLIN HFA) 108 (90 BASE) MCG/ACT inhaler Inhale 2 puffs into the lungs every 6 (six) hours as needed.      . ALPRAZolam (XANAX) 0.5 MG tablet Take 0.5 mg by mouth at bedtime as needed.       Marland Kitchen aspirin 81 MG tablet Take 81 mg by mouth daily.      . carvedilol (COREG) 12.5 MG tablet Take 1 tablet (12.5 mg total) by mouth 2 (two) times daily with a meal.  60 tablet  6  . citalopram (CELEXA) 10 MG tablet Take 1 tablet (10 mg total) by mouth every evening. With supper.      . clopidogrel (PLAVIX) 75 MG tablet Take 1 tablet (75 mg total) by mouth daily with breakfast.  30 tablet  6  . dicyclomine (BENTYL) 10 MG capsule Take 10 mg by mouth 2 (two) times daily as needed.         . fexofenadine (ALLEGRA) 180 MG tablet Take 180 mg by mouth as needed.      . folic acid (FOLVITE) A999333 MCG tablet Take 400 mcg by mouth daily.      Marland Kitchen HYDROcodone-acetaminophen (LORCET) 10-650 MG per tablet Take 1 tablet by mouth every 6 (six) hours as needed.       . insulin aspart (NOVOLOG) 100 UNIT/ML injection Inject 10 Units into the skin 3 (three) times daily before meals. Sliding scale      . insulin glargine (LANTUS) 100 UNIT/ML injection Inject 60 Units into the skin 2 (two) times daily.      . nitroGLYCERIN (NITROSTAT) 0.4 MG SL tablet Place 1 tablet (0.4 mg total) under the tongue every 5 (five) minutes as needed for chest pain (up to 3 doses).  25 tablet  3  . pantoprazole (PROTONIX) 40 MG tablet Take 1 tablet (40 mg total) by mouth daily at 12 noon.  30 tablet  2  . potassium chloride SA (K-DUR,KLOR-CON) 20 MEQ tablet Take 30 mEq by mouth 2 (two) times daily.      . rosuvastatin (CRESTOR) 5 MG tablet Take 1 tablet (5 mg total) by mouth daily.  30 tablet  2  . torsemide (DEMADEX) 20 MG tablet Take 2 tablets (40 mg total) by mouth daily.  60 tablet  2  .  vitamin B-12 (CYANOCOBALAMIN) 1000 MCG tablet Take 1 tablet by mouth daily.       Marland Kitchen DISCONTD: potassium chloride SA (KLOR-CON M20) 20 MEQ tablet Take 2 tablets (40 mEq total) by mouth daily.  60 tablet  6  . DISCONTD: torsemide (DEMADEX) 20 MG tablet Take 40 mg by mouth daily.        Past Medical History  Diagnosis Date  . Type 2 diabetes mellitus   . Anxiety disorder   . GERD (gastroesophageal reflux disease)   . Hyperlipidemia     H/o GI intolerance to Lipitor  . Ischemic cardiomyopathy     s/p Medtronic BiV ICD 2006  . Systolic and diastolic CHF, chronic     EF 30-35% by echo 01/2012  . Impingement syndrome of left shoulder   . Aneurysm     Apical; previously on Coumadin  . Chronic renal insufficiency     Creatinine 1.17 January 2010  . Peripheral polyneuropathy     Refractory to several medications  . Coronary artery  disease     Cath 02/15/12 revealed RCA as culprit lesion w/ occlusion at site of prior stent, patent LAD stent, chronic LCx and Diagonal dz; Medical therapy   . Depression     Social History Ms. Dingwall reports that she quit smoking about 9 years ago. Her smoking use included Cigarettes. She has a 24 pack-year smoking history. She has never used smokeless tobacco. Ms. Szarka reports that she does not drink alcohol.  Review of Systems As outlined above. No cough, fevers or chills. Fair appetite. No PND.  Physical Examination Filed Vitals:   04/17/12 1423  BP: 101/49  Pulse: 64   Filed Weights   04/17/12 1423  Weight: 175 lb 6.4 oz (79.561 kg)   Chronically ill-appearing woman in no acute distress. Wearing oxygen. HEENT: Conjunctiva and lids normal, oropharynx clear. Neck: Supple, increased JVP, no thyromegaly. Lungs: Diminished but generally clear to auscultation. Cardiac: Regular rate and rhythm, no S3. Abdomen: Soft, nontender, protuberant, bowel sounds present. Extremities: No pitting edema, distal pulses 1-2+. Skin: Warm and dry. Musculoskeletal: No kyphosis. Neuropsychiatric: Alert and oriented x3, affect grossly appropriate.   Problem List and Plan   SYSTOLIC HEART FAILURE, CHRONIC Remains symptomatic, although weight is stable and BNP is only mildly increased and tends to be chronically in this range. She reports compliance with her high-dose Lasix. Blood pressure and heart rate do not allow much room for further medication titration at this point. Recent lab work reviewed. Plan is to change from Lasix to Demadex beginning at 40 mg daily and advancing as tolerated. Will make a 1 week visit with Mr. Jacqualine Mau, check BMET at that time. I did discuss with her the possibility of evaluation in our advanced heart failure clinic in [place], although she cites transportation difficulties which would limit this.  Coronary atherosclerosis of native coronary artery No reported  angina. Last LVEF was 30-35%. In light of her continued symptoms, a followup echocardiogram will be obtained to ensure no change in valvular status or degree of pulmonary hypertension.    Satira Sark, M.D., F.A.C.C.

## 2012-04-17 NOTE — Assessment & Plan Note (Signed)
Remains symptomatic, although weight is stable and BNP is only mildly increased and tends to be chronically in this range. She reports compliance with her high-dose Lasix. Blood pressure and heart rate do not allow much room for further medication titration at this point. Recent lab work reviewed. Plan is to change from Lasix to Demadex beginning at 40 mg daily and advancing as tolerated. Will make a 1 week visit with Mr. Jacqualine Mau, check BMET at that time. I did discuss with her the possibility of evaluation in our advanced heart failure clinic in [place], although she cites transportation difficulties which would limit this.

## 2012-04-19 ENCOUNTER — Other Ambulatory Visit: Payer: Self-pay

## 2012-04-19 ENCOUNTER — Other Ambulatory Visit (INDEPENDENT_AMBULATORY_CARE_PROVIDER_SITE_OTHER): Payer: Medicare Other

## 2012-04-19 ENCOUNTER — Telehealth: Payer: Self-pay | Admitting: *Deleted

## 2012-04-19 DIAGNOSIS — R0602 Shortness of breath: Secondary | ICD-10-CM

## 2012-04-19 DIAGNOSIS — I428 Other cardiomyopathies: Secondary | ICD-10-CM

## 2012-04-19 DIAGNOSIS — I429 Cardiomyopathy, unspecified: Secondary | ICD-10-CM

## 2012-04-19 NOTE — Telephone Encounter (Signed)
Home Health nurse informed via voicemail.

## 2012-04-19 NOTE — Telephone Encounter (Signed)
Nurse from home health agency called to say that patient was still on K+ although patient was taken off lasix and placed on demadex. Please advise if patient need to stay on this supplement. Patient is going for BMET on 28th and seeing Gene on 30th of October.

## 2012-04-19 NOTE — Telephone Encounter (Signed)
Leave on potassium supplements and we can followup on BMET.

## 2012-04-20 ENCOUNTER — Telehealth: Payer: Self-pay | Admitting: *Deleted

## 2012-04-20 NOTE — Telephone Encounter (Signed)
Message copied by Merlene Laughter on Fri Apr 20, 2012  9:30 AM ------      Message from: MCDOWELL, Aloha Gell      Created: Fri Apr 20, 2012  7:04 AM       LVEF relatively stable with increased filling pressure (diastolic dysfunction), moderate MR, moderate pulmonary HTN. Her dyspnea is likely multifactorial and we will work on medication titration as able within her tolerances. Diuretics just adjusted.

## 2012-04-20 NOTE — Telephone Encounter (Signed)
Patient informed. 

## 2012-04-23 ENCOUNTER — Telehealth: Payer: Self-pay | Admitting: *Deleted

## 2012-04-23 NOTE — Telephone Encounter (Addendum)
Patient feeling more sob today than last week and is getting help from sister in law for ADL's. Patient is laying on right side and has to be still. Patient feels weak. Patient has gained about 3 pounds and weighs 178 lbs today. No swelling lower extremities. No c/o chest pain or dizziness. Torsemide and K+ doses verified. Patient informed if SOB becomes severe, then she should proceed to ED for evaluation. Patient informed that results of lab work aren't ready for view yet but once they are available, our office will give her a call back.

## 2012-04-24 ENCOUNTER — Telehealth: Payer: Self-pay | Admitting: *Deleted

## 2012-04-24 ENCOUNTER — Other Ambulatory Visit: Payer: Self-pay | Admitting: *Deleted

## 2012-04-24 DIAGNOSIS — N189 Chronic kidney disease, unspecified: Secondary | ICD-10-CM

## 2012-04-24 DIAGNOSIS — I5022 Chronic systolic (congestive) heart failure: Secondary | ICD-10-CM

## 2012-04-24 DIAGNOSIS — I2589 Other forms of chronic ischemic heart disease: Secondary | ICD-10-CM

## 2012-04-24 MED ORDER — TORSEMIDE 20 MG PO TABS: 40.0000 mg | ORAL_TABLET | Freq: Two times a day (BID) | ORAL | Status: DC

## 2012-04-24 MED ORDER — POTASSIUM CHLORIDE CRYS ER 20 MEQ PO TBCR: 20.0000 meq | EXTENDED_RELEASE_TABLET | Freq: Two times a day (BID) | ORAL | Status: DC

## 2012-04-24 NOTE — Telephone Encounter (Signed)
We will likely need to advance her diuretic dose, although would like to review her followup BMET first.

## 2012-04-24 NOTE — Telephone Encounter (Signed)
Patient informed. Lab order faxed to [facility] lab, and pharmacy and home health nurse notified of changes.

## 2012-04-24 NOTE — Telephone Encounter (Signed)
Please see result note or new phone note for treatment plan.

## 2012-04-24 NOTE — Telephone Encounter (Signed)
Message copied by Merlene Laughter on Tue Apr 24, 2012  8:30 AM ------      Message from: Satira Sark      Created: Tue Apr 24, 2012  8:18 AM       Noted. BUN 37, creatinine of 1.5, potassium 5.8. Suggest decrease KCl to 40 mEq once daily, and increase Demadex to 40 mg twice daily. She needs followup BMET in one week from this change. If she does not begin to improve and gains more weight, may need to be readmitted to the hospital.

## 2012-04-25 ENCOUNTER — Ambulatory Visit (INDEPENDENT_AMBULATORY_CARE_PROVIDER_SITE_OTHER): Payer: Medicare Other | Admitting: Physician Assistant

## 2012-04-25 ENCOUNTER — Inpatient Hospital Stay (HOSPITAL_COMMUNITY): Payer: Medicare Other

## 2012-04-25 ENCOUNTER — Inpatient Hospital Stay (HOSPITAL_COMMUNITY)
Admission: AD | Admit: 2012-04-25 | Discharge: 2012-05-02 | DRG: 286 | Disposition: A | Discharge status: Home Health | Payer: Medicare Other | Source: Ambulatory Visit | Attending: Cardiology | Admitting: Cardiology

## 2012-04-25 ENCOUNTER — Encounter: Payer: Self-pay | Admitting: Physician Assistant

## 2012-04-25 ENCOUNTER — Other Ambulatory Visit: Payer: Self-pay | Admitting: Physician Assistant

## 2012-04-25 VITALS — BP 102/64 | HR 68 | Ht 66.0 in | Wt 178.0 lb

## 2012-04-25 DIAGNOSIS — I509 Heart failure, unspecified: Secondary | ICD-10-CM | POA: Diagnosis present

## 2012-04-25 DIAGNOSIS — G609 Hereditary and idiopathic neuropathy, unspecified: Secondary | ICD-10-CM | POA: Diagnosis present

## 2012-04-25 DIAGNOSIS — E785 Hyperlipidemia, unspecified: Secondary | ICD-10-CM | POA: Diagnosis present

## 2012-04-25 DIAGNOSIS — N189 Chronic kidney disease, unspecified: Principal | ICD-10-CM | POA: Diagnosis present

## 2012-04-25 DIAGNOSIS — Z9861 Coronary angioplasty status: Secondary | ICD-10-CM

## 2012-04-25 DIAGNOSIS — K219 Gastro-esophageal reflux disease without esophagitis: Secondary | ICD-10-CM | POA: Diagnosis present

## 2012-04-25 DIAGNOSIS — I2510 Atherosclerotic heart disease of native coronary artery without angina pectoris: Secondary | ICD-10-CM | POA: Diagnosis present

## 2012-04-25 DIAGNOSIS — F329 Major depressive disorder, single episode, unspecified: Secondary | ICD-10-CM | POA: Diagnosis present

## 2012-04-25 DIAGNOSIS — Z95810 Presence of automatic (implantable) cardiac defibrillator: Secondary | ICD-10-CM

## 2012-04-25 DIAGNOSIS — I5023 Acute on chronic systolic (congestive) heart failure: Secondary | ICD-10-CM | POA: Diagnosis present

## 2012-04-25 DIAGNOSIS — I130 Hypertensive heart and chronic kidney disease with heart failure and stage 1 through stage 4 chronic kidney disease, or unspecified chronic kidney disease: Principal | ICD-10-CM | POA: Diagnosis present

## 2012-04-25 DIAGNOSIS — N289 Disorder of kidney and ureter, unspecified: Secondary | ICD-10-CM | POA: Diagnosis present

## 2012-04-25 DIAGNOSIS — F411 Generalized anxiety disorder: Secondary | ICD-10-CM | POA: Diagnosis present

## 2012-04-25 DIAGNOSIS — E119 Type 2 diabetes mellitus without complications: Secondary | ICD-10-CM | POA: Diagnosis present

## 2012-04-25 DIAGNOSIS — I5022 Chronic systolic (congestive) heart failure: Secondary | ICD-10-CM

## 2012-04-25 DIAGNOSIS — I2589 Other forms of chronic ischemic heart disease: Secondary | ICD-10-CM | POA: Diagnosis present

## 2012-04-25 DIAGNOSIS — F3289 Other specified depressive episodes: Secondary | ICD-10-CM | POA: Diagnosis present

## 2012-04-25 LAB — CBC WITH DIFFERENTIAL/PLATELET
Basophils Absolute: 0 10*3/uL (ref 0.0–0.1)
Basophils Relative: 1 % (ref 0–1)
Eosinophils Absolute: 0.3 10*3/uL (ref 0.0–0.7)
MCH: 27.2 pg (ref 26.0–34.0)
MCHC: 30.8 g/dL (ref 30.0–36.0)
Neutro Abs: 2.6 10*3/uL (ref 1.7–7.7)
Neutrophils Relative %: 60 % (ref 43–77)
Platelets: 147 10*3/uL — ABNORMAL LOW (ref 150–400)
RDW: 13.4 % (ref 11.5–15.5)

## 2012-04-25 LAB — COMPREHENSIVE METABOLIC PANEL
AST: 24 U/L (ref 0–37)
Albumin: 4 g/dL (ref 3.5–5.2)
Alkaline Phosphatase: 58 U/L (ref 39–117)
Chloride: 101 mEq/L (ref 96–112)
Potassium: 4.5 mEq/L (ref 3.5–5.1)
Total Bilirubin: 0.9 mg/dL (ref 0.3–1.2)
Total Protein: 7.7 g/dL (ref 6.0–8.3)

## 2012-04-25 LAB — PRO B NATRIURETIC PEPTIDE: Pro B Natriuretic peptide (BNP): 4659 pg/mL — ABNORMAL HIGH (ref 0–450)

## 2012-04-25 LAB — MAGNESIUM: Magnesium: 2.2 mg/dL (ref 1.5–2.5)

## 2012-04-25 LAB — GLUCOSE, CAPILLARY
Glucose-Capillary: 142 mg/dL — ABNORMAL HIGH (ref 70–99)
Glucose-Capillary: 140 mg/dL — ABNORMAL HIGH (ref 70–99)

## 2012-04-25 LAB — PROTIME-INR: INR: 1.28 (ref 0.00–1.49)

## 2012-04-25 MED ORDER — FUROSEMIDE 10 MG/ML IJ SOLN
80.0000 mg | Freq: Three times a day (TID) | INTRAMUSCULAR | Status: DC
  Administered 2012-04-25: 80 mg via INTRAVENOUS
  Filled 2012-04-25: qty 8

## 2012-04-25 MED ORDER — INSULIN ASPART 100 UNIT/ML ~~LOC~~ SOLN
0.0000 [IU] | Freq: Every day | SUBCUTANEOUS | Status: DC
  Administered 2012-04-28: 2 [IU] via SUBCUTANEOUS

## 2012-04-25 MED ORDER — SODIUM CHLORIDE 0.9 % IJ SOLN
3.0000 mL | Freq: Two times a day (BID) | INTRAMUSCULAR | Status: DC
  Administered 2012-04-25 – 2012-04-26 (×2): 3 mL via INTRAVENOUS

## 2012-04-25 MED ORDER — ASPIRIN EC 81 MG PO TBEC
81.0000 mg | DELAYED_RELEASE_TABLET | Freq: Every day | ORAL | Status: DC
  Administered 2012-04-26 – 2012-05-02 (×7): 81 mg via ORAL
  Filled 2012-04-25 (×7): qty 1

## 2012-04-25 MED ORDER — ACETAMINOPHEN 325 MG PO TABS: 650.0000 mg | ORAL_TABLET | ORAL | Status: DC | PRN

## 2012-04-25 MED ORDER — HYDROCODONE-ACETAMINOPHEN 10-325 MG PO TABS
1.0000 | ORAL_TABLET | Freq: Four times a day (QID) | ORAL | Status: DC | PRN
  Administered 2012-04-25 – 2012-05-02 (×21): 1 via ORAL
  Filled 2012-04-25 (×22): qty 1

## 2012-04-25 MED ORDER — ALBUTEROL SULFATE HFA 108 (90 BASE) MCG/ACT IN AERS
2.0000 | INHALATION_SPRAY | Freq: Four times a day (QID) | RESPIRATORY_TRACT | Status: DC | PRN
  Filled 2012-04-25: qty 6.7

## 2012-04-25 MED ORDER — ONDANSETRON HCL 4 MG/2ML IJ SOLN
4.0000 mg | Freq: Four times a day (QID) | INTRAMUSCULAR | Status: DC | PRN
  Administered 2012-04-26: 4 mg via INTRAVENOUS
  Filled 2012-04-25: qty 2

## 2012-04-25 MED ORDER — INSULIN ASPART 100 UNIT/ML ~~LOC~~ SOLN
0.0000 [IU] | Freq: Three times a day (TID) | SUBCUTANEOUS | Status: DC
  Administered 2012-04-26: 2 [IU] via SUBCUTANEOUS
  Administered 2012-04-26 – 2012-04-28 (×4): 3 [IU] via SUBCUTANEOUS
  Administered 2012-04-28: 2 [IU] via SUBCUTANEOUS
  Administered 2012-04-28: 5 [IU] via SUBCUTANEOUS
  Administered 2012-04-29: 3 [IU] via SUBCUTANEOUS
  Administered 2012-04-29: 5 [IU] via SUBCUTANEOUS
  Administered 2012-04-30: 3 [IU] via SUBCUTANEOUS
  Administered 2012-04-30: 2 [IU] via SUBCUTANEOUS
  Administered 2012-04-30 – 2012-05-01 (×2): 3 [IU] via SUBCUTANEOUS

## 2012-04-25 MED ORDER — CLOPIDOGREL BISULFATE 75 MG PO TABS
75.0000 mg | ORAL_TABLET | Freq: Every day | ORAL | Status: DC
  Administered 2012-04-26 – 2012-05-02 (×6): 75 mg via ORAL
  Filled 2012-04-25 (×8): qty 1

## 2012-04-25 MED ORDER — CARVEDILOL 6.25 MG PO TABS
6.2500 mg | ORAL_TABLET | Freq: Two times a day (BID) | ORAL | Status: DC
  Administered 2012-04-26 – 2012-05-02 (×12): 6.25 mg via ORAL
  Filled 2012-04-25 (×15): qty 1

## 2012-04-25 MED ORDER — PANTOPRAZOLE SODIUM 40 MG PO TBEC
40.0000 mg | DELAYED_RELEASE_TABLET | Freq: Every day | ORAL | Status: DC
  Administered 2012-04-26 – 2012-05-02 (×7): 40 mg via ORAL
  Filled 2012-04-25 (×7): qty 1

## 2012-04-25 MED ORDER — CITALOPRAM HYDROBROMIDE 20 MG PO TABS
20.0000 mg | ORAL_TABLET | Freq: Every day | ORAL | Status: DC
  Administered 2012-04-26 – 2012-05-02 (×7): 20 mg via ORAL
  Filled 2012-04-25 (×7): qty 1

## 2012-04-25 MED ORDER — MILRINONE IN DEXTROSE 20 MG/100ML IV SOLN
0.1250 ug/kg/min | INTRAVENOUS | Status: DC
  Administered 2012-04-25 – 2012-04-30 (×6): 0.25 ug/kg/min via INTRAVENOUS
  Administered 2012-05-01: 0.125 ug/kg/min via INTRAVENOUS
  Filled 2012-04-25 (×8): qty 100

## 2012-04-25 MED ORDER — ATORVASTATIN CALCIUM 10 MG PO TABS
10.0000 mg | ORAL_TABLET | Freq: Every day | ORAL | Status: DC
  Administered 2012-04-26 – 2012-05-01 (×6): 10 mg via ORAL
  Filled 2012-04-25 (×7): qty 1

## 2012-04-25 MED ORDER — ALPRAZOLAM 0.25 MG PO TABS
0.5000 mg | ORAL_TABLET | Freq: Every evening | ORAL | Status: DC | PRN
  Administered 2012-04-27 – 2012-05-01 (×4): 0.5 mg via ORAL
  Filled 2012-04-25: qty 1
  Filled 2012-04-25: qty 2
  Filled 2012-04-25: qty 1
  Filled 2012-04-25: qty 2
  Filled 2012-04-25 (×2): qty 1

## 2012-04-25 MED ORDER — SODIUM CHLORIDE 0.9 % IV SOLN
250.0000 mL | INTRAVENOUS | Status: DC | PRN
  Administered 2012-04-25 – 2012-04-29 (×2): 250 mL via INTRAVENOUS

## 2012-04-25 MED ORDER — INSULIN GLARGINE 100 UNIT/ML ~~LOC~~ SOLN
50.0000 [IU] | Freq: Two times a day (BID) | SUBCUTANEOUS | Status: DC
  Administered 2012-04-25 – 2012-05-02 (×14): 50 [IU] via SUBCUTANEOUS

## 2012-04-25 MED ORDER — SODIUM CHLORIDE 0.9 % IJ SOLN: 3.0000 mL | INTRAMUSCULAR | Status: DC | PRN

## 2012-04-25 NOTE — Assessment & Plan Note (Signed)
Recommendation is for patient to be admitted this evening directly to [facility], for aggressive management of recurrent acute/chronic systolic heart failure. Patient was seen and examined in conjunction with Dr. Aundra Dubin, who approved this plan. Specifically, the patient will be placed on an aggressive diuretic regimen consisting of Lasix 80 mg IV every 8 hours, as well as inotropic support with IV milrinone. Demadex and supplemental potassium to be placed on hold. Carvedilol to be decreased to 6.25 twice a day, in light of relative hypotension. Patient is to otherwise continue her remaining home medications. She'll need close monitoring of electrolytes and renal function, particularly in light of recent hyperkalemia (5.8). We'll also reassess LV function with a 2-D echocardiogram. As noted, this was most recently assessed as 35-40%, with evidence of diastolic dysfunction, moderate MR, and moderate pulmonary hypertension. Previous assessment this past August yielded EF of 30-35%, with grade 2 diastolic dysfunction. Also, plan is for patient to be admitted to our heart failure service, for closer monitoring and medical management.

## 2012-04-25 NOTE — Assessment & Plan Note (Signed)
Renal function to be monitored closely

## 2012-04-25 NOTE — Assessment & Plan Note (Signed)
Continue home dose insulin, as well as sliding scale coverage

## 2012-04-25 NOTE — Assessment & Plan Note (Signed)
Quiescent on current medication regimen, with no interim complaint of chest pain. We'll continue ASA and Plavix.

## 2012-04-25 NOTE — Progress Notes (Addendum)
Primary Cardiologist: Johnny Bridge, MD   HPI: Patient presents for one-week clinic followup, per Dr. Domenic Polite, following adjustment of diuretic regimen with substitution of Lasix with Demadex, for aggressive outpatient management of chronic systolic heart failure. A followup echocardiogram was also ordered, and as follows:   - 2-D echo: LVEF relatively stable (EF 35-40%) with increased filling pressure (diastolic dysfunction), moderate MR, moderate pulmonary HTN  Most recent labs , October 28: Potassium 5.8 (4.9), BUN 37, and creatinine 1.5 (1.3). BNP 370 (311). Given this, supplemental potassium decreased to 40 daily, Demadex increased to 40 twice a day.  Clinically, she reports today worsening respiratory status, reporting dyspnea at rest. She is on continuous oxygen. She has gained 3 pounds since last OV. She denies any chest pain.   Allergies  Allergen Reactions  . Metformin   . Sulfonamide Derivatives     Current Outpatient Prescriptions  Medication Sig Dispense Refill  . albuterol (PROVENTIL HFA;VENTOLIN HFA) 108 (90 BASE) MCG/ACT inhaler Inhale 2 puffs into the lungs every 6 (six) hours as needed.      . ALPRAZolam (XANAX) 0.5 MG tablet Take 0.5 mg by mouth at bedtime as needed.       Marland Kitchen aspirin 81 MG tablet Take 81 mg by mouth daily.      . carvedilol (COREG) 12.5 MG tablet Take 1 tablet (12.5 mg total) by mouth 2 (two) times daily with a meal.  60 tablet  6  . citalopram (CELEXA) 20 MG tablet Take 20 mg by mouth daily.      . clopidogrel (PLAVIX) 75 MG tablet Take 1 tablet (75 mg total) by mouth daily with breakfast.  30 tablet  6  . dicyclomine (BENTYL) 10 MG capsule Take 10 mg by mouth 2 (two) times daily as needed.       . fexofenadine (ALLEGRA) 180 MG tablet Take 180 mg by mouth as needed.      . folic acid (FOLVITE) A999333 MCG tablet Take 400 mcg by mouth daily.      Marland Kitchen HYDROcodone-acetaminophen (LORCET) 10-650 MG per tablet Take 1 tablet by mouth every 6 (six) hours as needed.        . insulin aspart (NOVOLOG) 100 UNIT/ML injection Inject 10 Units into the skin 3 (three) times daily as needed. Sliding scale      . insulin glargine (LANTUS) 100 UNIT/ML injection Inject 50 Units into the skin 2 (two) times daily.      . nitroGLYCERIN (NITROSTAT) 0.4 MG SL tablet Place 1 tablet (0.4 mg total) under the tongue every 5 (five) minutes as needed for chest pain (up to 3 doses).  25 tablet  3  . pantoprazole (PROTONIX) 40 MG tablet Take 1 tablet (40 mg total) by mouth daily at 12 noon.  30 tablet  2  . potassium chloride SA (K-DUR,KLOR-CON) 20 MEQ tablet Take 1 tablet (20 mEq total) by mouth 2 (two) times daily.  60 tablet  3  . rosuvastatin (CRESTOR) 5 MG tablet Take 1 tablet (5 mg total) by mouth daily.  30 tablet  2  . torsemide (DEMADEX) 20 MG tablet Take 2 tablets (40 mg total) by mouth 2 (two) times daily.  120 tablet  3  . vitamin B-12 (CYANOCOBALAMIN) 1000 MCG tablet Take 1 tablet by mouth daily.         Past Medical History  Diagnosis Date  . Type 2 diabetes mellitus   . Anxiety disorder   . GERD (gastroesophageal reflux disease)   . Hyperlipidemia  H/o GI intolerance to Lipitor  . Ischemic cardiomyopathy     s/p Medtronic BiV ICD 2006  . Systolic and diastolic CHF, chronic     EF 30-35% by echo 01/2012  . Impingement syndrome of left shoulder   . Aneurysm     Apical; previously on Coumadin  . Chronic renal insufficiency     Creatinine 1.17 January 2010  . Peripheral polyneuropathy     Refractory to several medications  . Coronary artery disease     Cath 02/15/12 revealed RCA as culprit lesion w/ occlusion at site of prior stent, patent LAD stent, chronic LCx and Diagonal dz; Medical therapy   . Depression     Past Surgical History  Procedure Date  . Abdominal hysterectomy   . Cardiac defibrillator placement 05/28/2009    Medtronic ICD placed by Dr. Caryl Comes secondary to ICM/CHF, [phone] Fidelis lead fracture with ICD storm 2010 requiring new RV lead placement    . Insert / replace / remove pacemaker   . Cholecystectomy   . Eye surgery     History   Social History  . Marital Status: Married    Spouse Name: N/A    Number of Children: N/A  . Years of Education: N/A   Occupational History  . Retired    Social History Main Topics  . Smoking status: Former Smoker -- 0.8 packs/day for 30 years    Types: Cigarettes    Quit date: 06/27/2002  . Smokeless tobacco: Never Used  . Alcohol Use: No  . Drug Use: Not on file  . Sexually Active: Not Currently   Other Topics Concern  . Not on file   Social History Narrative  . No narrative on file    Family History  Problem Relation Age of Onset  . Coronary artery disease Neg Hx     ROS: no nausea, vomiting; no fever, chills; no melena, hematochezia; no claudication  PHYSICAL EXAM: BP 102/64  Pulse 68  Ht 5\' 6"  (1.676 m)  Wt 178 lb (80.74 kg)  BMI 28.73 kg/m2  SpO2 93% GENERAL:  78 year old female, moderately obese, sitting upright; NAD HEENT: NCAT, PERRLA, EOMI; sclera clear; no xanthelasma; Nasal cannula in place  NECK:  Marked jugular venous pulsation, noted on right LUNGS:  diminished breath sounds, with faint late crackles; no wheezes  CARDIAC: RRR (S1, S2); no significant murmurs; no rubs or gallops ABDOMEN:  protuberant  EXTREMETIES:  no peripheral edema SKIN: warm/dry; no obvious rash/lesions MUSCULOSKELETAL: no joint deformity NEURO: no focal deficit; NL affect   EKG: reviewed and available in Electronic Records   ASSESSMENT & PLAN:  SYSTOLIC HEART FAILURE, CHRONIC Recommendation is for patient to be admitted this evening directly to [facility], for aggressive management of recurrent acute/chronic systolic heart failure. Patient was seen and examined in conjunction with Dr. Aundra Dubin, who approved this plan. Specifically, the patient will be placed on an aggressive diuretic regimen consisting of Lasix 80 mg IV every 8 hours, as well as inotropic support with IV  milrinone. Demadex and supplemental potassium to be placed on hold. Carvedilol to be decreased to 6.25 twice a day, in light of relative hypotension. Patient is to otherwise continue her remaining home medications. She'll need close monitoring of electrolytes and renal function, particularly in light of recent hyperkalemia (5.8). We'll also reassess LV function with a 2-D echocardiogram. As noted, this was most recently assessed as 35-40%, with evidence of diastolic dysfunction, moderate MR, and moderate pulmonary hypertension. Previous assessment this past  August yielded EF of 30-35%, with grade 2 diastolic dysfunction. Also, plan is for patient to be admitted to our heart failure service, for closer monitoring and medical management.  Coronary artery disease Quiescent on current medication regimen, with no interim complaint of chest pain. We'll continue ASA and Plavix.  Chronic kidney disease Renal function to be monitored closely  Diabetes mellitus Continue home dose insulin, as well as sliding scale coverage    Gene Elain Wixon, PAC   Patient seen with PA Silvester Reierson, agree with the above note.  She is very volume overloaded with marginal BP (SBP around 100).  Suspect low output failure.  I think she needs to be admitted for IV milrinone and Lasix.  We will send her to [facility] today.   [name] 04/25/2012 10:33 PM

## 2012-04-25 NOTE — Progress Notes (Signed)
Pt admitted from [facility] office. Orders written by [name], PA-C -- no changes made. I checked on patient who is currently stable -- VSS. She reports being hungry and thirsty on the way over but is enjoying her dinner sitting up in bed. The patient had no questions or acute needs. Dayna Dunn PA-C

## 2012-04-26 ENCOUNTER — Inpatient Hospital Stay (HOSPITAL_COMMUNITY): Payer: Medicare Other

## 2012-04-26 ENCOUNTER — Encounter (HOSPITAL_COMMUNITY): Payer: Self-pay | Admitting: *Deleted

## 2012-04-26 DIAGNOSIS — I5023 Acute on chronic systolic (congestive) heart failure: Secondary | ICD-10-CM

## 2012-04-26 DIAGNOSIS — N189 Chronic kidney disease, unspecified: Secondary | ICD-10-CM

## 2012-04-26 LAB — BASIC METABOLIC PANEL
BUN: 33 mg/dL — ABNORMAL HIGH (ref 6–23)
BUN: 29 mg/dL — ABNORMAL HIGH (ref 6–23)
CO2: 31 mEq/L (ref 19–32)
Calcium: 8.8 mg/dL (ref 8.4–10.5)
Chloride: 97 mEq/L (ref 96–112)
Creatinine, Ser: 1.38 mg/dL — ABNORMAL HIGH (ref 0.50–1.10)
GFR calc Af Amer: 42 mL/min — ABNORMAL LOW (ref >90)
GFR calc non Af Amer: 36 mL/min — ABNORMAL LOW (ref >90)
Glucose, Bld: 205 mg/dL — ABNORMAL HIGH (ref 70–99)
Potassium: 3.7 mEq/L (ref 3.5–5.1)
Potassium: 4.4 mEq/L (ref 3.5–5.1)

## 2012-04-26 LAB — HEMOGLOBIN A1C
Hgb A1c MFr Bld: 7.6 % — ABNORMAL HIGH (ref <5.7)
Mean Plasma Glucose: 171 mg/dL — ABNORMAL HIGH (ref <117)

## 2012-04-26 LAB — GLUCOSE, CAPILLARY: Glucose-Capillary: 176 mg/dL — ABNORMAL HIGH (ref 70–99)

## 2012-04-26 MED ORDER — ASPIRIN 81 MG PO CHEW
324.0000 mg | CHEWABLE_TABLET | ORAL | Status: AC
  Administered 2012-04-27: 324 mg via ORAL
  Filled 2012-04-26: qty 1
  Filled 2012-04-26: qty 3

## 2012-04-26 MED ORDER — HEPARIN SODIUM (PORCINE) 5000 UNIT/ML IJ SOLN
5000.0000 [IU] | Freq: Three times a day (TID) | INTRAMUSCULAR | Status: DC
  Administered 2012-04-26 – 2012-05-02 (×18): 5000 [IU] via SUBCUTANEOUS
  Filled 2012-04-26 (×22): qty 1

## 2012-04-26 MED ORDER — SODIUM CHLORIDE 0.9 % IV SOLN: 250.0000 mL | INTRAVENOUS | Status: DC | PRN

## 2012-04-26 MED ORDER — SODIUM CHLORIDE 0.9 % IJ SOLN
10.0000 mL | INTRAMUSCULAR | Status: DC | PRN
  Administered 2012-05-01: 30 mL
  Administered 2012-05-01 – 2012-05-02 (×3): 10 mL

## 2012-04-26 MED ORDER — SODIUM CHLORIDE 0.9 % IJ SOLN: 3.0000 mL | INTRAMUSCULAR | Status: DC | PRN

## 2012-04-26 MED ORDER — POTASSIUM CHLORIDE CRYS ER 20 MEQ PO TBCR
40.0000 meq | EXTENDED_RELEASE_TABLET | Freq: Once | ORAL | Status: AC
  Administered 2012-04-26: 40 meq via ORAL
  Filled 2012-04-26: qty 2

## 2012-04-26 MED ORDER — SPIRONOLACTONE 12.5 MG HALF TABLET
12.5000 mg | ORAL_TABLET | Freq: Every day | ORAL | Status: DC
  Administered 2012-04-26 – 2012-04-29 (×4): 12.5 mg via ORAL
  Filled 2012-04-26 (×5): qty 1

## 2012-04-26 MED ORDER — SODIUM CHLORIDE 0.9 % IJ SOLN
10.0000 mL | Freq: Two times a day (BID) | INTRAMUSCULAR | Status: DC
  Administered 2012-04-26: 20 mL
  Administered 2012-04-27 – 2012-05-01 (×7): 10 mL

## 2012-04-26 MED ORDER — SODIUM CHLORIDE 0.9 % IJ SOLN
3.0000 mL | Freq: Two times a day (BID) | INTRAMUSCULAR | Status: DC
  Administered 2012-04-26: 3 mL via INTRAVENOUS

## 2012-04-26 MED ORDER — FUROSEMIDE 10 MG/ML IJ SOLN
80.0000 mg | Freq: Three times a day (TID) | INTRAMUSCULAR | Status: DC
  Administered 2012-04-26 (×3): 80 mg via INTRAVENOUS
  Filled 2012-04-26 (×7): qty 8

## 2012-04-26 MED ORDER — POTASSIUM CHLORIDE 20 MEQ PO PACK: 40.0000 meq | PACK | Freq: Once | ORAL | Status: DC

## 2012-04-26 NOTE — Progress Notes (Signed)
[facility]  Patient Status: Active (receiving services up to time of hospitalization)  [facility] is providing the following services: RN and PT  If patient discharges after hours, please call [phone].   [name] 04/26/2012, 12:50 PM

## 2012-04-26 NOTE — Progress Notes (Signed)
Peripherally Inserted Central Catheter/Midline Placement  The IV Nurse has discussed with the patient and/or persons authorized to consent for the patient, the purpose of this procedure and the potential benefits and risks involved with this procedure.  The benefits include less needle sticks, lab draws from the catheter and patient may be discharged home with the catheter.  Risks include, but not limited to, infection, bleeding, blood clot (thrombus formation), and puncture of an artery; nerve damage and irregular heat beat.  Alternatives to this procedure were also discussed.  PICC/Midline Placement Documentation        [name] 04/26/2012, 9:58 AM

## 2012-04-26 NOTE — Progress Notes (Signed)
Patient ID: [name], female   DOB: 04/20/1934, 78 y.o.   MRN: [id number]    SUBJECTIVE: Nauseated this morning.  Still orthopneic.  Has diuresed well initially.      Marland Kitchen aspirin EC  81 mg Oral Daily  . atorvastatin  10 mg Oral q1800  . carvedilol  6.25 mg Oral BID WC  . citalopram  20 mg Oral Daily  . clopidogrel  75 mg Oral Q breakfast  . furosemide  80 mg Intravenous Q8H  . heparin subcutaneous  5,000 Units Subcutaneous Q8H  . insulin aspart  0-15 Units Subcutaneous TID WC  . insulin aspart  0-5 Units Subcutaneous QHS  . insulin glargine  50 Units Subcutaneous BID  . pantoprazole  40 mg Oral Q1200  . potassium chloride  40 mEq Oral Once  . sodium chloride  3 mL Intravenous Q12H  . spironolactone  12.5 mg Oral Daily  . DISCONTD: furosemide  80 mg Intravenous TID  . DISCONTD: potassium chloride  40 mEq Oral Once  milrinone @ 0.25    Filed Vitals:   04/26/12 0100 04/26/12 0300 04/26/12 0323 04/26/12 0500  BP: 118/66 117/43  96/30  Pulse: 73 73  68  Temp:   98.2 F (36.8 C)   TempSrc:   Oral   Resp: 20 19  20   Height:      Weight:    177 lb 0.5 oz (80.3 kg)  SpO2: 95% 95%  96%    Intake/Output Summary (Last 24 hours) at 04/26/12 0729 Last data filed at 04/26/12 0600  Gross per 24 hour  Intake 327.45 ml  Output   2200 ml  Net -1872.55 ml    LABS: Basic Metabolic Panel:  Basename 04/26/12 0505 04/25/12 1828  NA 141 139  K 3.7 4.5  CL 101 101  CO2 34* 31  GLUCOSE 103* 174*  BUN 33* 36*  CREATININE 1.38* 1.45*  CALCIUM 8.8 9.3  MG -- 2.2  PHOS -- --   Liver Function Tests:  Basename 04/25/12 1828  AST 24  ALT 12  ALKPHOS 58  BILITOT 0.9  PROT 7.7  ALBUMIN 4.0   No results found for this basename: LIPASE:2,AMYLASE:2 in the last 72 hours CBC:  Basename 04/25/12 1828  WBC 4.4  NEUTROABS 2.6  HGB 10.3*  HCT 33.4*  MCV 88.4  PLT 147*   Cardiac Enzymes: No results found for this basename: CKTOTAL:3,CKMB:3,CKMBINDEX:3,TROPONINI:3 in  the last 72 hours BNP: No components found with this basename: POCBNP:3 D-Dimer: No results found for this basename: DDIMER:2 in the last 72 hours Hemoglobin A1C:  Basename 04/25/12 1828  HGBA1C 7.6*   Fasting Lipid Panel: No results found for this basename: CHOL,HDL,LDLCALC,TRIG,CHOLHDL,LDLDIRECT in the last 72 hours Thyroid Function Tests: No results found for this basename: TSH,T4TOTAL,FREET3,T3FREE,THYROIDAB in the last 72 hours Anemia Panel: No results found for this basename: VITAMINB12,FOLATE,FERRITIN,TIBC,IRON,RETICCTPCT in the last 72 hours  RADIOLOGY: Dg Chest Port 1 View  04/25/2012  *RADIOLOGY REPORT*  Clinical Data: CHF  PORTABLE CHEST - 1 VIEW  Comparison: 04/16/2012  Findings: Cardiomegaly noted with improving interstitial edema. Left subclavian AICD / pacer noted.  No enlarging effusion or pneumothorax.  IMPRESSION: Improving interstitial edema pattern   Original Report Authenticated By: Jerilynn Mages. Daryll Brod, M.D.     PHYSICAL EXAM General: NAD Neck: JVP 16 cm, no thyromegaly or thyroid nodule.  Lungs: Crackles at bases bilaterally CV: Lateral PMI.  Heart regular S1/S2, no S3/S4, 1/6 HSM apex.  Trace ankle edema.  No carotid  bruit.   Abdomen: Soft, nontender, no hepatosplenomegaly, mild distention.  Neurologic: Alert and oriented x 3.  Psych: Normal affect. Extremities: No clubbing or cyanosis.   TELEMETRY: Reviewed telemetry pt in NSR with biv pacing  ASSESSMENT AND PLAN:  78 yo with history of ischemic CMP and acute on chronic systolic CHF presented with dyspnea with minimal exertion and was found to be severely volume overloaded.   1. Acute on chronic systolic CHF: [id number] on last echo, has CRT-D device.  She is still very volume overloaded on exam.  She diuresed reasonably well overnight.  - Continue Lasix IV and milrinone gtt.  - PICC line place => will draw co-ox and follow CVPs.  - Add spironolactone 12.5 mg daily.  - RHC tomorrow.  2. CAD: Stable with no  chest pain.  Continue ASA, Plavix, statin.  3. Nausea: She has a mildly distended abdomen.  I suspect this could be due to CHF with hepatic congestion.  LFTs were normal when checked last night.   [name] 04/26/2012 7:35 AM

## 2012-04-26 NOTE — Care Management Note (Addendum)
    Page 1 of 1   05/02/2012     2:34:44 PM   CARE MANAGEMENT NOTE 05/02/2012  Patient:  [name], [name]   Account Number:  [id number]  Date Initiated:  04/26/2012  Documentation initiated by:  Felix Ahmadi Assessment:   78 yr-old female adm with dx of systolic heart failure; lives alone, active with [facility].     Action/Plan:   D/C home when stable   Anticipated DC Date:  05/03/2012   Anticipated DC Plan:  [place]  CM consult      [facility] Choice  Resumption Of Svcs/PTA Provider     [place] arranged  HH-1 RN  [place].   Status of service:  Completed, signed off  Discharge Disposition:  [place]  Per UR Regulation:  Reviewed for med. necessity/level of care/duration of stay  Comments:  PCP:  Dr. [name] 05/02/12, Aida Raider RNC-MNN, BSN, [phone], CM received referral.  Pt active with [facility] PTA.  Marie at [facility] notified of orders and confirmation received.

## 2012-04-27 ENCOUNTER — Encounter (HOSPITAL_COMMUNITY)
Admission: AD | Disposition: A | Discharge status: Home Health | Payer: Self-pay | Source: Ambulatory Visit | Attending: Cardiology

## 2012-04-27 DIAGNOSIS — I509 Heart failure, unspecified: Secondary | ICD-10-CM

## 2012-04-27 HISTORY — PX: RIGHT HEART CATHETERIZATION: SHX5447

## 2012-04-27 LAB — CBC
Hemoglobin: 9.7 g/dL — ABNORMAL LOW (ref 12.0–15.0)
Hemoglobin: 9.4 g/dL — ABNORMAL LOW (ref 12.0–15.0)
MCH: 27.7 pg (ref 26.0–34.0)
MCH: 26.9 pg (ref 26.0–34.0)
MCV: 87.7 fL (ref 78.0–100.0)
MCV: 87.4 fL (ref 78.0–100.0)
RBC: 3.5 MIL/uL — ABNORMAL LOW (ref 3.87–5.11)
RBC: 3.49 MIL/uL — ABNORMAL LOW (ref 3.87–5.11)
WBC: 4.3 10*3/uL (ref 4.0–10.5)

## 2012-04-27 LAB — POCT I-STAT 3, ART BLOOD GAS (G3+)
O2 Saturation: 96 %
pCO2 arterial: 57.9 mmHg (ref 35.0–45.0)

## 2012-04-27 LAB — GLUCOSE, CAPILLARY
Glucose-Capillary: 111 mg/dL — ABNORMAL HIGH (ref 70–99)
Glucose-Capillary: 165 mg/dL — ABNORMAL HIGH (ref 70–99)
Glucose-Capillary: 179 mg/dL — ABNORMAL HIGH (ref 70–99)
Glucose-Capillary: 149 mg/dL — ABNORMAL HIGH (ref 70–99)

## 2012-04-27 LAB — BASIC METABOLIC PANEL
CO2: 34 mEq/L — ABNORMAL HIGH (ref 19–32)
CO2: 33 mEq/L — ABNORMAL HIGH (ref 19–32)
Calcium: 8.5 mg/dL (ref 8.4–10.5)
GFR calc non Af Amer: 42 mL/min — ABNORMAL LOW (ref >90)
Glucose, Bld: 151 mg/dL — ABNORMAL HIGH (ref 70–99)
Potassium: 4.1 mEq/L (ref 3.5–5.1)
Potassium: 3.6 mEq/L (ref 3.5–5.1)
Sodium: 140 mEq/L (ref 135–145)
Sodium: 137 mEq/L (ref 135–145)

## 2012-04-27 LAB — POCT I-STAT 3, VENOUS BLOOD GAS (G3P V)
Acid-Base Excess: 4 mmol/L — ABNORMAL HIGH (ref 0.0–2.0)
Bicarbonate: 31.3 mEq/L — ABNORMAL HIGH (ref 20.0–24.0)

## 2012-04-27 SURGERY — RIGHT HEART CATH
Anesthesia: Moderate Sedation

## 2012-04-27 SURGERY — RIGHT HEART CATH
Anesthesia: LOCAL

## 2012-04-27 MED ORDER — SODIUM CHLORIDE 0.9 % IJ SOLN
3.0000 mL | Freq: Two times a day (BID) | INTRAMUSCULAR | Status: DC
  Administered 2012-04-29: 3 mL via INTRAVENOUS

## 2012-04-27 MED ORDER — NITROGLYCERIN 0.2 MG/ML ON CALL CATH LAB
INTRAVENOUS | Status: AC
  Filled 2012-04-27: qty 1

## 2012-04-27 MED ORDER — ACETAMINOPHEN 325 MG PO TABS
650.0000 mg | ORAL_TABLET | ORAL | Status: DC | PRN
  Filled 2012-04-27: qty 2

## 2012-04-27 MED ORDER — MIDAZOLAM HCL 2 MG/2ML IJ SOLN
INTRAMUSCULAR | Status: AC
  Filled 2012-04-27: qty 2

## 2012-04-27 MED ORDER — ONDANSETRON HCL 4 MG/2ML IJ SOLN: 4.0000 mg | Freq: Four times a day (QID) | INTRAMUSCULAR | Status: DC | PRN

## 2012-04-27 MED ORDER — FUROSEMIDE 10 MG/ML IJ SOLN
10.0000 mg/h | INTRAVENOUS | Status: DC
  Administered 2012-04-27 – 2012-04-29 (×3): 10 mg/h via INTRAVENOUS
  Filled 2012-04-27 (×10): qty 25

## 2012-04-27 MED ORDER — FENTANYL CITRATE 0.05 MG/ML IJ SOLN
INTRAMUSCULAR | Status: AC
  Filled 2012-04-27: qty 2

## 2012-04-27 MED ORDER — LIDOCAINE HCL (PF) 1 % IJ SOLN
INTRAMUSCULAR | Status: AC
  Filled 2012-04-27: qty 30

## 2012-04-27 MED ORDER — FUROSEMIDE 10 MG/ML IJ SOLN
80.0000 mg | Freq: Once | INTRAMUSCULAR | Status: AC
  Administered 2012-04-27: 80 mg via INTRAVENOUS
  Filled 2012-04-27: qty 8

## 2012-04-27 MED ORDER — HEPARIN (PORCINE) IN NACL 2-0.9 UNIT/ML-% IJ SOLN
INTRAMUSCULAR | Status: AC
  Filled 2012-04-27: qty 500

## 2012-04-27 MED ORDER — SODIUM CHLORIDE 0.9 % IJ SOLN: 3.0000 mL | INTRAMUSCULAR | Status: DC | PRN

## 2012-04-27 MED ORDER — SODIUM CHLORIDE 0.9 % IV SOLN
250.0000 mL | INTRAVENOUS | Status: DC
  Administered 2012-04-29 – 2012-04-30 (×2): 250 mL via INTRAVENOUS

## 2012-04-27 NOTE — Interval H&P Note (Signed)
History and Physical Interval Note:  04/27/2012 7:49 AM  [name]  has presented today for surgery, with the diagnosis of CHF  The various methods of treatment have been discussed with the patient and family. After consideration of risks, benefits and other options for treatment, the patient has consented to  Procedure(s) (LRB) with comments: RIGHT HEART CATH (N/A) as a surgical intervention .  The patient's history has been reviewed, patient examined, no change in status, stable for surgery.  I have reviewed the patient's chart and labs.  Questions were answered to the patient's satisfaction.     Jennaya Pogue [employer]

## 2012-04-27 NOTE — H&P (View-Only) (Signed)
Patient ID: [name], female   DOB: [birth year], 78 y.o.   MRN: [id number]    SUBJECTIVE: Nauseated this morning.  Still orthopneic.  Has diuresed well initially.      Marland Kitchen aspirin EC  81 mg Oral Daily  . atorvastatin  10 mg Oral q1800  . carvedilol  6.25 mg Oral BID WC  . citalopram  20 mg Oral Daily  . clopidogrel  75 mg Oral Q breakfast  . furosemide  80 mg Intravenous Q8H  . heparin subcutaneous  5,000 Units Subcutaneous Q8H  . insulin aspart  0-15 Units Subcutaneous TID WC  . insulin aspart  0-5 Units Subcutaneous QHS  . insulin glargine  50 Units Subcutaneous BID  . pantoprazole  40 mg Oral Q1200  . potassium chloride  40 mEq Oral Once  . sodium chloride  3 mL Intravenous Q12H  . spironolactone  12.5 mg Oral Daily  . DISCONTD: furosemide  80 mg Intravenous TID  . DISCONTD: potassium chloride  40 mEq Oral Once  milrinone @ 0.25    Filed Vitals:   04/26/12 0100 04/26/12 0300 04/26/12 0323 04/26/12 0500  BP: 118/66 117/43  96/30  Pulse: 73 73  68  Temp:   98.2 F (36.8 C)   TempSrc:   Oral   Resp: 20 19  20   Height:      Weight:    177 lb 0.5 oz (80.3 kg)  SpO2: 95% 95%  96%    Intake/Output Summary (Last 24 hours) at 04/26/12 0729 Last data filed at 04/26/12 0600  Gross per 24 hour  Intake 327.45 ml  Output   2200 ml  Net -1872.55 ml    LABS: Basic Metabolic Panel:  Basename 04/26/12 0505 04/25/12 1828  NA 141 139  K 3.7 4.5  CL 101 101  CO2 34* 31  GLUCOSE 103* 174*  BUN 33* 36*  CREATININE 1.38* 1.45*  CALCIUM 8.8 9.3  MG -- 2.2  PHOS -- --   Liver Function Tests:  Basename 04/25/12 1828  AST 24  ALT 12  ALKPHOS 58  BILITOT 0.9  PROT 7.7  ALBUMIN 4.0   No results found for this basename: LIPASE:2,AMYLASE:2 in the last 72 hours CBC:  Basename 04/25/12 1828  WBC 4.4  NEUTROABS 2.6  HGB 10.3*  HCT 33.4*  MCV 88.4  PLT 147*   Cardiac Enzymes: No results found for this basename: CKTOTAL:3,CKMB:3,CKMBINDEX:3,TROPONINI:3 in  the last 72 hours BNP: No components found with this basename: POCBNP:3 D-Dimer: No results found for this basename: DDIMER:2 in the last 72 hours Hemoglobin A1C:  Basename 04/25/12 1828  HGBA1C 7.6*   Fasting Lipid Panel: No results found for this basename: CHOL,HDL,LDLCALC,TRIG,CHOLHDL,LDLDIRECT in the last 72 hours Thyroid Function Tests: No results found for this basename: TSH,T4TOTAL,FREET3,T3FREE,THYROIDAB in the last 72 hours Anemia Panel: No results found for this basename: VITAMINB12,FOLATE,FERRITIN,TIBC,IRON,RETICCTPCT in the last 72 hours  RADIOLOGY: Dg Chest Port 1 View  04/25/2012  *RADIOLOGY REPORT*  Clinical Data: CHF  PORTABLE CHEST - 1 VIEW  Comparison: 04/16/2012  Findings: Cardiomegaly noted with improving interstitial edema. Left subclavian AICD / pacer noted.  No enlarging effusion or pneumothorax.  IMPRESSION: Improving interstitial edema pattern   Original Report Authenticated By: Jerilynn Mages. Daryll Brod, M.D.     PHYSICAL EXAM General: NAD Neck: JVP 16 cm, no thyromegaly or thyroid nodule.  Lungs: Crackles at bases bilaterally CV: Lateral PMI.  Heart regular S1/S2, no S3/S4, 1/6 HSM apex.  Trace ankle edema.  No carotid  bruit.   Abdomen: Soft, nontender, no hepatosplenomegaly, mild distention.  Neurologic: Alert and oriented x 3.  Psych: Normal affect. Extremities: No clubbing or cyanosis.   TELEMETRY: Reviewed telemetry pt in NSR with biv pacing  ASSESSMENT AND PLAN:  78 yo with history of ischemic CMP and acute on chronic systolic CHF presented with dyspnea with minimal exertion and was found to be severely volume overloaded.   1. Acute on chronic systolic CHF: [id number] on last echo, has CRT-D device.  She is still very volume overloaded on exam.  She diuresed reasonably well overnight.  - Continue Lasix IV and milrinone gtt.  - PICC line place => will draw co-ox and follow CVPs.  - Add spironolactone 12.5 mg daily.  - RHC tomorrow.  2. CAD: Stable with no  chest pain.  Continue ASA, Plavix, statin.  3. Nausea: She has a mildly distended abdomen.  I suspect this could be due to CHF with hepatic congestion.  LFTs were normal when checked last night.   [name] 04/26/2012 7:35 AM

## 2012-04-27 NOTE — CV Procedure (Signed)
   Cardiac Catheterization Procedure Note  Name: DEMETRESS AMBERT MRN: [id number] DOB: Jan 21, 1934  Procedure: Right Heart Cath  Indication: CHF, low output on milrinone.    Procedural Details: The right groin was prepped, draped, and anesthetized with 1% lidocaine. Using the modified Seldinger technique a 5 French sheath was placed in the right femoral artery and a 7 French sheath was placed in the right femoral vein. A Swan-Ganz catheter was used for the right heart catheterization. Standard protocol was followed for recording of right heart pressures and sampling of oxygen saturations. Fick and thermodilution cardiac output was calculated. There were no immediate procedural complications. The patient was transferred to the post catheterization recovery area for further monitoring.  Procedural Findings: Hemodynamics (mmHg) RA 19 RV 47/18 PA 51/24, mean 36 PCWP mean 26  Oxygen saturations (on 4L [place]): PA 51% AO 96%  Cardiac Output (Fick) 4.26  Cardiac Index (Fick) 2.24  Cardiac Output (Thermo) 5.19 Cardiac Index (Thermo) 2.73   PVR 2.3 WU (using Fick CO)  Final Conclusions:  Elevated right and left heart filling pressures.  She did not diurese well yesterday, plan to give Lasix 80 mg IV x 1 followed by Lasix gtt @ 10 mg/hr.   [name] 04/27/2012, 8:16 AM

## 2012-04-27 NOTE — Progress Notes (Signed)
Pharmacist Heart Failure Core Measure Documentation  Assessment: [name] has an EF documented as 35%on 04/19/12 by echo.  Rationale: Heart failure patients with left ventricular systolic dysfunction (LVSD) and an EF < 40% should be prescribed an angiotensin converting enzyme inhibitor (ACEI) or angiotensin receptor blocker (ARB) at discharge unless a contraindication is documented in the medical record.  This patient is not currently on an ACEI or ARB for HF.  This note is being placed in the record in order to provide documentation that a contraindication to the use of these agents is present for this encounter.  ACE Inhibitor or Angiotensin Receptor Blocker is contraindicated (specify all that apply)  []   ACEI allergy AND ARB allergy []   Angioedema []   Moderate or severe aortic stenosis []   Hyperkalemia []   Hypotension []   Renal artery stenosis [x]   Worsening renal function, preexisting renal disease or dysfunction   Georgina Peer 04/27/2012 11:21 AM

## 2012-04-27 NOTE — Progress Notes (Signed)
Patient ID: [name], female   DOB: 03-Jan-1934, 78 y.o.   MRN: [id number]     SUBJECTIVE: Still short of breath with exertion, nausea better.  Did not diurese particularly well yesterday.  Renal fxn stable.   RHC (on milrinone 0.25) RA 19  RV 47/18  PA 51/24, mean 36  PCWP mean 26  Oxygen saturations (on 4L [place]):  PA 51%  AO 96%  Cardiac Output (Fick) 4.26  Cardiac Index (Fick) 2.24  Cardiac Output (Thermo) 5.19  Cardiac Index (Thermo) 2.73  PVR 2.3 WU (using Fick CO)      . aspirin  324 mg Oral Pre-Cath  . aspirin EC  81 mg Oral Daily  . atorvastatin  10 mg Oral q1800  . carvedilol  6.25 mg Oral BID WC  . citalopram  20 mg Oral Daily  . clopidogrel  75 mg Oral Q breakfast  . fentaNYL      . furosemide  80 mg Intravenous Once  . heparin      . heparin subcutaneous  5,000 Units Subcutaneous Q8H  . insulin aspart  0-15 Units Subcutaneous TID WC  . insulin aspart  0-5 Units Subcutaneous QHS  . insulin glargine  50 Units Subcutaneous BID  . lidocaine      . midazolam      . nitroGLYCERIN      . pantoprazole  40 mg Oral Q1200  . sodium chloride  10-40 mL Intracatheter Q12H  . sodium chloride  3 mL Intravenous Q12H  . sodium chloride  3 mL Intravenous Q12H  . spironolactone  12.5 mg Oral Daily  . DISCONTD: furosemide  80 mg Intravenous Q8H  milrinone @ 0.25    Filed Vitals:   04/27/12 0456 04/27/12 0500 04/27/12 0600 04/27/12 0728  BP:  104/38    Pulse:  76 72 72  Temp:      TempSrc:      Resp:      Height:      Weight: 177 lb 11.1 oz (80.6 kg)     SpO2:  96% 96%     Intake/Output Summary (Last 24 hours) at 04/27/12 0826 Last data filed at 04/27/12 0600  Gross per 24 hour  Intake  690.1 ml  Output   1575 ml  Net -884.9 ml    LABS: Basic Metabolic Panel:  Basename 04/27/12 0501 04/26/12 1400 04/25/12 1828  NA 140 137 --  K 4.1 4.4 --  CL 99 97 --  CO2 34* 31 --  GLUCOSE 151* 205* --  BUN 27* 29* --  CREATININE 1.35* 1.29* --    CALCIUM 9.0 8.8 --  MG -- -- 2.2  PHOS -- -- --   Liver Function Tests:  Basename 04/25/12 1828  AST 24  ALT 12  ALKPHOS 58  BILITOT 0.9  PROT 7.7  ALBUMIN 4.0   No results found for this basename: LIPASE:2,AMYLASE:2 in the last 72 hours CBC:  Basename 04/27/12 0501 04/25/12 1828  WBC 4.3 4.4  NEUTROABS -- 2.6  HGB 9.7* 10.3*  HCT 30.7* 33.4*  MCV 87.7 88.4  PLT 125* 147*   Cardiac Enzymes: No results found for this basename: CKTOTAL:3,CKMB:3,CKMBINDEX:3,TROPONINI:3 in the last 72 hours BNP: No components found with this basename: POCBNP:3 D-Dimer: No results found for this basename: DDIMER:2 in the last 72 hours Hemoglobin A1C:  Basename 04/25/12 1828  HGBA1C 7.6*   Fasting Lipid Panel: No results found for this basename: CHOL,HDL,LDLCALC,TRIG,CHOLHDL,LDLDIRECT in the last 72 hours Thyroid Function Tests:  No results found for this basename: TSH,T4TOTAL,FREET3,T3FREE,THYROIDAB in the last 72 hours Anemia Panel: No results found for this basename: VITAMINB12,FOLATE,FERRITIN,TIBC,IRON,RETICCTPCT in the last 72 hours  RADIOLOGY: Dg Chest Port 1 View  04/25/2012  *RADIOLOGY REPORT*  Clinical Data: CHF  PORTABLE CHEST - 1 VIEW  Comparison: 04/16/2012  Findings: Cardiomegaly noted with improving interstitial edema. Left subclavian AICD / pacer noted.  No enlarging effusion or pneumothorax.  IMPRESSION: Improving interstitial edema pattern   Original Report Authenticated By: Jerilynn Mages. Daryll Brod, M.D.     PHYSICAL EXAM General: NAD Neck: JVP 16 cm, no thyromegaly or thyroid nodule.  Lungs: Crackles at bases bilaterally CV: Lateral PMI.  Heart regular S1/S2, no S3/S4, 1/6 HSM apex.  Trace ankle edema.  No carotid bruit.  Abdomen: Soft, nontender, no hepatosplenomegaly, mild distention.  Neurologic: Alert and oriented x 3.  Psych: Normal affect. Extremities: No clubbing or cyanosis.   TELEMETRY: Reviewed telemetry pt in NSR with biv pacing  ASSESSMENT AND PLAN:  78  yo with history of ischemic CMP and acute on chronic systolic CHF presented with dyspnea with minimal exertion and was found to be severely volume overloaded.   1. Acute on chronic systolic CHF: [id number] on last echo, has CRT-D device.  She is still very volume overloaded on exam.  Right and left heart filling pressures elevated on RHC today.  Cardiac index reasonable on milrinone. She did not diurese well yesterday.  - Continue milrinone gtt - Will change from Lasix boluses to Lasix infusion at 10 mg/hr  2. CAD: Stable with no chest pain.  Continue ASA, Plavix, statin.  3. CKD: Suspect cardiorenal component.  Stable creatinine, continue to follow.   [name] 04/27/2012 8:26 AM

## 2012-04-28 DIAGNOSIS — I5022 Chronic systolic (congestive) heart failure: Secondary | ICD-10-CM

## 2012-04-28 LAB — CBC
MCH: 27.3 pg (ref 26.0–34.0)
Platelets: 125 10*3/uL — ABNORMAL LOW (ref 150–400)
RBC: 3.55 MIL/uL — ABNORMAL LOW (ref 3.87–5.11)
WBC: 4.1 10*3/uL (ref 4.0–10.5)

## 2012-04-28 LAB — GLUCOSE, CAPILLARY
Glucose-Capillary: 130 mg/dL — ABNORMAL HIGH (ref 70–99)
Glucose-Capillary: 181 mg/dL — ABNORMAL HIGH (ref 70–99)

## 2012-04-28 LAB — BASIC METABOLIC PANEL
Calcium: 8.6 mg/dL (ref 8.4–10.5)
GFR calc non Af Amer: 46 mL/min — ABNORMAL LOW (ref >90)
Sodium: 141 mEq/L (ref 135–145)

## 2012-04-28 LAB — CARBOXYHEMOGLOBIN
Carboxyhemoglobin: 1.2 % (ref 0.5–1.5)
Methemoglobin: 1.1 % (ref 0.0–1.5)
Total hemoglobin: 10.7 g/dL — ABNORMAL LOW (ref 12.0–16.0)

## 2012-04-28 MED ORDER — POTASSIUM CHLORIDE CRYS ER 20 MEQ PO TBCR
20.0000 meq | EXTENDED_RELEASE_TABLET | Freq: Once | ORAL | Status: AC
  Administered 2012-04-28: 20 meq via ORAL
  Filled 2012-04-28: qty 1

## 2012-04-28 MED ORDER — POTASSIUM CHLORIDE CRYS ER 20 MEQ PO TBCR
40.0000 meq | EXTENDED_RELEASE_TABLET | Freq: Every day | ORAL | Status: DC
  Administered 2012-04-28 – 2012-05-01 (×4): 40 meq via ORAL
  Filled 2012-04-28 (×6): qty 2

## 2012-04-28 MED ORDER — BIOTENE DRY MOUTH MT LIQD
15.0000 mL | Freq: Two times a day (BID) | OROMUCOSAL | Status: DC
  Administered 2012-04-28 – 2012-05-02 (×8): 15 mL via OROMUCOSAL

## 2012-04-28 NOTE — Progress Notes (Signed)
Patient ID: [name], female   DOB: 1934/01/08, 78 y.o.   MRN: [id number]     SUBJECTIVE: Still short of breath with exertion, nausea better.  She diuresed quite well on Lasix drip started yesterday..  Renal fxn slightly improved.  RHC (on milrinone 0.25) RA 19  RV 47/18  PA 51/24, mean 36  PCWP mean 26  Oxygen saturations (on 4L [place]):  PA 51%  AO 96%  Cardiac Output (Fick) 4.26  Cardiac Index (Fick) 2.24  Cardiac Output (Thermo) 5.19  Cardiac Index (Thermo) 2.73  PVR 2.3 WU (using Fick CO)      . aspirin EC  81 mg Oral Daily  . atorvastatin  10 mg Oral q1800  . carvedilol  6.25 mg Oral BID WC  . citalopram  20 mg Oral Daily  . clopidogrel  75 mg Oral Q breakfast  . heparin subcutaneous  5,000 Units Subcutaneous Q8H  . insulin aspart  0-15 Units Subcutaneous TID WC  . insulin aspart  0-5 Units Subcutaneous QHS  . insulin glargine  50 Units Subcutaneous BID  . pantoprazole  40 mg Oral Q1200  . potassium chloride  40 mEq Oral Daily  . sodium chloride  10-40 mL Intracatheter Q12H  . sodium chloride  3 mL Intravenous Q12H  . spironolactone  12.5 mg Oral Daily  milrinone @ 0.25    Filed Vitals:   04/28/12 0425 04/28/12 0500 04/28/12 0740 04/28/12 1145  BP: 109/40  109/41 101/42  Pulse: 73  77 70  Temp: 98.4 F (36.9 C)  97.9 F (36.6 C) 97.9 F (36.6 C)  TempSrc: Oral  Oral Oral  Resp: 13  16 18   Height:      Weight:  174 lb 14.4 oz (79.334 kg)    SpO2: 98%  97% 95%    Intake/Output Summary (Last 24 hours) at 04/28/12 1318 Last data filed at 04/28/12 1300  Gross per 24 hour  Intake 1009.81 ml  Output   2800 ml  Net -1790.19 ml    LABS: Basic Metabolic Panel:  Basename 04/28/12 0516 04/27/12 2235 04/25/12 1828  NA 141 137 --  K 3.4* 3.6 --  CL 98 95* --  CO2 35* 33* --  GLUCOSE 169* 220* --  BUN 22 24* --  CREATININE 1.12* 1.21* --  CALCIUM 8.6 8.5 --  MG -- -- 2.2  PHOS -- -- --   Liver Function Tests:  Basename 04/25/12 1828    AST 24  ALT 12  ALKPHOS 58  BILITOT 0.9  PROT 7.7  ALBUMIN 4.0   No results found for this basename: LIPASE:2,AMYLASE:2 in the last 72 hours CBC:  Basename 04/28/12 0516 04/27/12 1020 04/25/12 1828  WBC 4.1 3.6* --  NEUTROABS -- -- 2.6  HGB 9.7* 9.4* --  HCT 31.1* 30.5* --  MCV 87.6 87.4 --  PLT 125* 127* --   Cardiac Enzymes: No results found for this basename: CKTOTAL:3,CKMB:3,CKMBINDEX:3,TROPONINI:3 in the last 72 hours BNP: No components found with this basename: POCBNP:3 D-Dimer: No results found for this basename: DDIMER:2 in the last 72 hours Hemoglobin A1C:  Basename 04/25/12 1828  HGBA1C 7.6*   Fasting Lipid Panel: No results found for this basename: CHOL,HDL,LDLCALC,TRIG,CHOLHDL,LDLDIRECT in the last 72 hours Thyroid Function Tests: No results found for this basename: TSH,T4TOTAL,FREET3,T3FREE,THYROIDAB in the last 72 hours Anemia Panel: No results found for this basename: VITAMINB12,FOLATE,FERRITIN,TIBC,IRON,RETICCTPCT in the last 72 hours  RADIOLOGY: Dg Chest Port 1 View  04/25/2012  *RADIOLOGY REPORT*  Clinical Data:  CHF  PORTABLE CHEST - 1 VIEW  Comparison: 04/16/2012  Findings: Cardiomegaly noted with improving interstitial edema. Left subclavian AICD / pacer noted.  No enlarging effusion or pneumothorax.  IMPRESSION: Improving interstitial edema pattern   Original Report Authenticated By: Jerilynn Mages. Daryll Brod, M.D.     PHYSICAL EXAM General: NAD Neck: JVP 16 cm, no thyromegaly or thyroid nodule.  Lungs: Crackles at bases bilaterally, few wheezes CV: Lateral PMI.  Heart regular S1/S2, no S3/S4, 1/6 HSM apex.  Trace ankle edema.  No carotid bruit.  Abdomen: Soft, nontender, no hepatosplenomegaly, mild distention.  Neurologic: Alert and oriented x 3.  Psych: Normal affect. Extremities: No clubbing or cyanosis.   TELEMETRY: Reviewed telemetry pt in NSR with biv pacing  ASSESSMENT AND PLAN:  78 yo with history of ischemic CMP and acute on chronic  systolic CHF presented with dyspnea with minimal exertion and was found to be severely volume overloaded.   1. Acute on chronic systolic CHF: [id number] on last echo, has CRT-D device.   She diuresed well with the lasix drip but is still volume overloaded.  Continue lasix drip 1 more day and reassess. 2. CAD: Stable with no chest pain.  Continue ASA, Plavix, statin.  3. CKD: Suspect cardiorenal component.  Stable creatinine, continue to follow.   [name]. 04/28/2012 1:18 PM

## 2012-04-29 DIAGNOSIS — Z95810 Presence of automatic (implantable) cardiac defibrillator: Secondary | ICD-10-CM

## 2012-04-29 LAB — GLUCOSE, CAPILLARY
Glucose-Capillary: 87 mg/dL (ref 70–99)
Glucose-Capillary: 203 mg/dL — ABNORMAL HIGH (ref 70–99)
Glucose-Capillary: 154 mg/dL — ABNORMAL HIGH (ref 70–99)
Glucose-Capillary: 145 mg/dL — ABNORMAL HIGH (ref 70–99)

## 2012-04-29 LAB — BASIC METABOLIC PANEL
CO2: 36 mEq/L — ABNORMAL HIGH (ref 19–32)
Chloride: 95 mEq/L — ABNORMAL LOW (ref 96–112)
Creatinine, Ser: 1.23 mg/dL — ABNORMAL HIGH (ref 0.50–1.10)
Potassium: 3.8 mEq/L (ref 3.5–5.1)
Sodium: 137 mEq/L (ref 135–145)

## 2012-04-29 MED ORDER — POTASSIUM CHLORIDE CRYS ER 20 MEQ PO TBCR
20.0000 meq | EXTENDED_RELEASE_TABLET | Freq: Once | ORAL | Status: AC
  Administered 2012-04-29: 20 meq via ORAL
  Filled 2012-04-29: qty 1

## 2012-04-29 MED ORDER — ALBUTEROL SULFATE (5 MG/ML) 0.5% IN NEBU
2.5000 mg | INHALATION_SOLUTION | Freq: Four times a day (QID) | RESPIRATORY_TRACT | Status: DC
  Administered 2012-04-29 – 2012-05-02 (×10): 2.5 mg via RESPIRATORY_TRACT
  Filled 2012-04-29 (×13): qty 0.5

## 2012-04-29 NOTE — Progress Notes (Signed)
Patient ID: [name], female   DOB: June 22, 1934, 78 y.o.   MRN: [id number]     SUBJECTIVE: Still short of breath with exertion, nausea better.  She diuresed quite well on Lasix drip started yesterday.  Good diuresis overnight.  RHC (on milrinone 0.25) RA 19  RV 47/18  PA 51/24, mean 36  PCWP mean 26  Oxygen saturations (on 4L [place]):  PA 51%  AO 96%  Cardiac Output (Fick) 4.26  Cardiac Index (Fick) 2.24  Cardiac Output (Thermo) 5.19  Cardiac Index (Thermo) 2.73  PVR 2.3 WU (using Fick CO)      . antiseptic oral rinse  15 mL Mouth Rinse BID  . aspirin EC  81 mg Oral Daily  . atorvastatin  10 mg Oral q1800  . carvedilol  6.25 mg Oral BID WC  . citalopram  20 mg Oral Daily  . clopidogrel  75 mg Oral Q breakfast  . heparin subcutaneous  5,000 Units Subcutaneous Q8H  . insulin aspart  0-15 Units Subcutaneous TID WC  . insulin aspart  0-5 Units Subcutaneous QHS  . insulin glargine  50 Units Subcutaneous BID  . pantoprazole  40 mg Oral Q1200  . [COMPLETED] potassium chloride  20 mEq Oral Once  . potassium chloride  40 mEq Oral Daily  . sodium chloride  10-40 mL Intracatheter Q12H  . sodium chloride  3 mL Intravenous Q12H  . spironolactone  12.5 mg Oral Daily  milrinone @ 0.25    Filed Vitals:   04/29/12 0500 04/29/12 0542 04/29/12 0730 04/29/12 0732  BP:   100/39 100/39  Pulse:   79 76  Temp:   98.1 F (36.7 C)   TempSrc:   Oral   Resp:   16 16  Height:      Weight: 174 lb 6.1 oz (79.1 kg) 174 lb 6.1 oz (79.1 kg)    SpO2:   95% 98%    Intake/Output Summary (Last 24 hours) at 04/29/12 0952 Last data filed at 04/29/12 0800  Gross per 24 hour  Intake  770.3 ml  Output   2400 ml  Net -1629.7 ml    LABS: Basic Metabolic Panel:  Basename 04/28/12 0516 04/27/12 2235  NA 141 137  K 3.4* 3.6  CL 98 95*  CO2 35* 33*  GLUCOSE 169* 220*  BUN 22 24*  CREATININE 1.12* 1.21*  CALCIUM 8.6 8.5  MG -- --  PHOS -- --   Liver Function Tests: No results  found for this basename: AST:2,ALT:2,ALKPHOS:2,BILITOT:2,PROT:2,ALBUMIN:2 in the last 72 hours No results found for this basename: LIPASE:2,AMYLASE:2 in the last 72 hours CBC:  Basename 04/28/12 0516 04/27/12 1020  WBC 4.1 3.6*  NEUTROABS -- --  HGB 9.7* 9.4*  HCT 31.1* 30.5*  MCV 87.6 87.4  PLT 125* 127*   Cardiac Enzymes: No results found for this basename: CKTOTAL:3,CKMB:3,CKMBINDEX:3,TROPONINI:3 in the last 72 hours BNP: No components found with this basename: POCBNP:3 D-Dimer: No results found for this basename: DDIMER:2 in the last 72 hours Hemoglobin A1C: No results found for this basename: HGBA1C in the last 72 hours Fasting Lipid Panel: No results found for this basename: CHOL,HDL,LDLCALC,TRIG,CHOLHDL,LDLDIRECT in the last 72 hours Thyroid Function Tests: No results found for this basename: TSH,T4TOTAL,FREET3,T3FREE,THYROIDAB in the last 72 hours Anemia Panel: No results found for this basename: VITAMINB12,FOLATE,FERRITIN,TIBC,IRON,RETICCTPCT in the last 72 hours  RADIOLOGY: Dg Chest Port 1 View  04/25/2012  *RADIOLOGY REPORT*  Clinical Data: CHF  PORTABLE CHEST - 1 VIEW  Comparison: 04/16/2012  Findings: Cardiomegaly noted with improving interstitial edema. Left subclavian AICD / pacer noted.  No enlarging effusion or pneumothorax.  IMPRESSION: Improving interstitial edema pattern   Original Report Authenticated By: Jerilynn Mages. Daryll Brod, M.D.     PHYSICAL EXAM General: NAD Neck: JVP 16 cm, no thyromegaly or thyroid nodule.  Lungs: Crackles at bases bilaterally, tight  wheezes CV: Lateral PMI.  Heart regular S1/S2, no S3/S4, 1/6 HSM apex.  Trace ankle edema.  No carotid bruit.  Abdomen: Soft, nontender, no hepatosplenomegaly, mild distention.  Neurologic: Alert and oriented x 3.  Psych: Normal affect. Extremities: No clubbing or cyanosis.   TELEMETRY: Reviewed telemetry pt in NSR with biv pacing  ASSESSMENT AND PLAN:  78 yo with history of ischemic CMP and acute on  chronic systolic CHF presented with dyspnea with minimal exertion and was found to be severely volume overloaded.   1. Acute on chronic systolic CHF: [id number] on last echo, has CRT-D device.   She diuresed well with the lasix drip but is still volume overloaded.  Continue lasix drip 1 more day and reassess.  Labs pending for today.  Her renal function appears to be improving. Continue milrinone. 2. CAD: Stable with no chest pain.  Continue ASA, Plavix, statin.  3. CKD: Suspect cardiorenal component.  Stable creatinine, continue to follow. Improving   [name]. 04/29/2012 9:52 AM

## 2012-04-30 LAB — BASIC METABOLIC PANEL
Chloride: 96 mEq/L (ref 96–112)
GFR calc Af Amer: 54 mL/min — ABNORMAL LOW (ref >90)
Potassium: 3.3 mEq/L — ABNORMAL LOW (ref 3.5–5.1)
Sodium: 139 mEq/L (ref 135–145)

## 2012-04-30 LAB — POCT I-STAT 3, VENOUS BLOOD GAS (G3P V)
Acid-Base Excess: 9 mmol/L — ABNORMAL HIGH (ref 0.0–2.0)
pH, Ven: 7.352 — ABNORMAL HIGH (ref 7.250–7.300)

## 2012-04-30 LAB — GLUCOSE, CAPILLARY
Glucose-Capillary: 183 mg/dL — ABNORMAL HIGH (ref 70–99)
Glucose-Capillary: 179 mg/dL — ABNORMAL HIGH (ref 70–99)

## 2012-04-30 MED ORDER — POTASSIUM CHLORIDE 20 MEQ PO PACK
40.0000 meq | PACK | Freq: Once | ORAL | Status: DC
  Filled 2012-04-30: qty 2

## 2012-04-30 MED ORDER — ISOSORBIDE DINITRATE 10 MG PO TABS
10.0000 mg | ORAL_TABLET | Freq: Three times a day (TID) | ORAL | Status: DC
  Administered 2012-04-30 – 2012-05-01 (×3): 10 mg via ORAL
  Filled 2012-04-30 (×7): qty 1

## 2012-04-30 MED ORDER — TORSEMIDE 20 MG PO TABS
80.0000 mg | ORAL_TABLET | Freq: Every day | ORAL | Status: DC
  Administered 2012-04-30 – 2012-05-02 (×3): 80 mg via ORAL
  Filled 2012-04-30 (×3): qty 4

## 2012-04-30 MED ORDER — POTASSIUM CHLORIDE CRYS ER 20 MEQ PO TBCR
40.0000 meq | EXTENDED_RELEASE_TABLET | Freq: Once | ORAL | Status: AC
  Administered 2012-04-30: 40 meq via ORAL

## 2012-04-30 MED ORDER — SPIRONOLACTONE 25 MG PO TABS
25.0000 mg | ORAL_TABLET | Freq: Every day | ORAL | Status: DC
  Administered 2012-04-30 – 2012-05-02 (×3): 25 mg via ORAL
  Filled 2012-04-30 (×3): qty 1

## 2012-04-30 MED ORDER — HYDRALAZINE HCL 25 MG PO TABS
12.5000 mg | ORAL_TABLET | Freq: Three times a day (TID) | ORAL | Status: DC
  Administered 2012-04-30: 09:00:00 via ORAL
  Administered 2012-04-30 – 2012-05-01 (×3): 12.5 mg via ORAL
  Filled 2012-04-30 (×7): qty 0.5

## 2012-04-30 NOTE — Progress Notes (Signed)
Patient ID: [name], female   DOB: 1934/03/19, 79 y.o.   MRN: [id number]     SUBJECTIVE: Has diuresed better with Lasix gtt.  Less short of breath. Feels better. Renal fxn improved.  CVP 9 this morning.  Weight is down 5 lbs.   RHC (on milrinone 0.25) RA 19  RV 47/18  PA 51/24, mean 36  PCWP mean 26  Oxygen saturations (on 4L [place]):  PA 51%  AO 96%  Cardiac Output (Fick) 4.26  Cardiac Index (Fick) 2.24  Cardiac Output (Thermo) 5.19  Cardiac Index (Thermo) 2.73  PVR 2.3 WU (using Fick CO)      . albuterol  2.5 mg Nebulization Q6H  . antiseptic oral rinse  15 mL Mouth Rinse BID  . aspirin EC  81 mg Oral Daily  . atorvastatin  10 mg Oral q1800  . carvedilol  6.25 mg Oral BID WC  . citalopram  20 mg Oral Daily  . clopidogrel  75 mg Oral Q breakfast  . heparin subcutaneous  5,000 Units Subcutaneous Q8H  . hydrALAZINE  12.5 mg Oral Q8H  . insulin aspart  0-15 Units Subcutaneous TID WC  . insulin aspart  0-5 Units Subcutaneous QHS  . insulin glargine  50 Units Subcutaneous BID  . isosorbide dinitrate  10 mg Oral Q8H  . pantoprazole  40 mg Oral Q1200  . potassium chloride  40 mEq Oral Once  . [COMPLETED] potassium chloride  20 mEq Oral Once  . potassium chloride  40 mEq Oral Daily  . sodium chloride  10-40 mL Intracatheter Q12H  . sodium chloride  3 mL Intravenous Q12H  . spironolactone  25 mg Oral Daily  . torsemide  80 mg Oral Daily  . [DISCONTINUED] spironolactone  12.5 mg Oral Daily  milrinone @ 0.25    Filed Vitals:   04/29/12 2320 04/30/12 0359 04/30/12 0500 04/30/12 0725  BP: 109/53 108/47  112/66  Pulse: 90 73  72  Temp: 98.7 F (37.1 C) 98.4 F (36.9 C)  98.5 F (36.9 C)  TempSrc:    Oral  Resp: 18 19  18   Height:      Weight:   171 lb 15.3 oz (78 kg)   SpO2: 96% 96%  95%    Intake/Output Summary (Last 24 hours) at 04/30/12 0744 Last data filed at 04/30/12 0700  Gross per 24 hour  Intake 1680.03 ml  Output   3725 ml  Net -2044.97 ml     LABS: Basic Metabolic Panel:  Basename 04/30/12 0500 04/29/12 1030  NA 139 137  K 3.3* 3.8  CL 96 95*  CO2 36* 36*  GLUCOSE 185* 256*  BUN 21 21  CREATININE 1.12* 1.23*  CALCIUM 9.1 8.8  MG -- --  PHOS -- --   Liver Function Tests: No results found for this basename: AST:2,ALT:2,ALKPHOS:2,BILITOT:2,PROT:2,ALBUMIN:2 in the last 72 hours No results found for this basename: LIPASE:2,AMYLASE:2 in the last 72 hours CBC:  Basename 04/28/12 0516 04/27/12 1020  WBC 4.1 3.6*  NEUTROABS -- --  HGB 9.7* 9.4*  HCT 31.1* 30.5*  MCV 87.6 87.4  PLT 125* 127*   Cardiac Enzymes: No results found for this basename: CKTOTAL:3,CKMB:3,CKMBINDEX:3,TROPONINI:3 in the last 72 hours BNP: No components found with this basename: POCBNP:3 D-Dimer: No results found for this basename: DDIMER:2 in the last 72 hours Hemoglobin A1C: No results found for this basename: HGBA1C in the last 72 hours Fasting Lipid Panel: No results found for this basename: CHOL,HDL,LDLCALC,TRIG,CHOLHDL,LDLDIRECT in the  last 72 hours Thyroid Function Tests: No results found for this basename: TSH,T4TOTAL,FREET3,T3FREE,THYROIDAB in the last 72 hours Anemia Panel: No results found for this basename: VITAMINB12,FOLATE,FERRITIN,TIBC,IRON,RETICCTPCT in the last 72 hours  RADIOLOGY: Dg Chest Port 1 View  04/25/2012  *RADIOLOGY REPORT*  Clinical Data: CHF  PORTABLE CHEST - 1 VIEW  Comparison: 04/16/2012  Findings: Cardiomegaly noted with improving interstitial edema. Left subclavian AICD / pacer noted.  No enlarging effusion or pneumothorax.  IMPRESSION: Improving interstitial edema pattern   Original Report Authenticated By: Jerilynn Mages. Daryll Brod, M.D.     PHYSICAL EXAM General: NAD Neck: JVP 10 cm, no thyromegaly or thyroid nodule.  Lungs: Crackles at bases bilaterally CV: Lateral PMI.  Heart regular S1/S2, no S3/S4, 1/6 HSM apex.  Trace ankle edema.  No carotid bruit.  Abdomen: Soft, nontender, no hepatosplenomegaly, no  distention.  Neurologic: Alert and oriented x 3.  Psych: Normal affect. Extremities: No clubbing or cyanosis.   TELEMETRY: Reviewed telemetry pt in NSR with biv pacing  ASSESSMENT AND PLAN:  78 yo with history of ischemic CMP and acute on chronic systolic CHF presented with dyspnea with minimal exertion and was found to be severely volume overloaded.   1. Acute on chronic systolic CHF: [id number] on last echo, has CRT-D device.  Volume status improved and weight down on milrinone/lasix gtt.  CVP 9 this morning.  Still some volume overload but better.   - Continue milrinone gtt but turn down to 0.125 - Stop lasix gtt, start torsemide 80 mg po daily.  - Add hydralazine 12.5 mg tid and isordil 10 mg tid.  - spironolactone to 25 mg daily and replete K.  2. CAD: Stable with no chest pain.  Continue ASA, Plavix, statin.  3. CKD: Suspect cardiorenal component.  Stable creatinine, continue to follow.  4. Ambulate today.   [name] 04/30/2012 7:44 AM

## 2012-05-01 LAB — BASIC METABOLIC PANEL
BUN: 30 mg/dL — ABNORMAL HIGH (ref 6–23)
Chloride: 98 mEq/L (ref 96–112)
Creatinine, Ser: 1.23 mg/dL — ABNORMAL HIGH (ref 0.50–1.10)
GFR calc Af Amer: 48 mL/min — ABNORMAL LOW (ref >90)
GFR calc non Af Amer: 42 mL/min — ABNORMAL LOW (ref >90)
Glucose, Bld: 95 mg/dL (ref 70–99)

## 2012-05-01 LAB — GLUCOSE, CAPILLARY
Glucose-Capillary: 86 mg/dL (ref 70–99)
Glucose-Capillary: 159 mg/dL — ABNORMAL HIGH (ref 70–99)

## 2012-05-01 MED ORDER — ISOSORBIDE DINITRATE 20 MG PO TABS
20.0000 mg | ORAL_TABLET | Freq: Three times a day (TID) | ORAL | Status: DC
  Administered 2012-05-01 – 2012-05-02 (×4): 20 mg via ORAL
  Filled 2012-05-01 (×7): qty 1

## 2012-05-01 MED ORDER — HYDRALAZINE HCL 25 MG PO TABS
25.0000 mg | ORAL_TABLET | Freq: Three times a day (TID) | ORAL | Status: DC
  Administered 2012-05-01 – 2012-05-02 (×3): 25 mg via ORAL
  Filled 2012-05-01 (×6): qty 1

## 2012-05-01 NOTE — Evaluation (Signed)
Physical Therapy Evaluation Patient Details Name: [name] MRN: [id number] DOB: [birth year] Today's Date: 05/01/2012 Time: [id number] PT Time Calculation (min): 29 min  PT Assessment / Plan / Recommendation Clinical Impression  Admitted with CHF and volume overload; Pt. would benefit from acute PT at this time to address trasnfers, ambulation, and decreased activity tolerance so that pt. may retrun home alone safely. Pt. stated that in the last week or so she has not been able to perform ADLs as she normally would due to fatigue and SOB. Pt. educated on energy conservation due to SOB while ambulating.     PT Assessment  Patient needs continued PT services          Barriers to Discharge None            Frequency Min 3X/week    Precautions / Restrictions Precautions Precautions: None Restrictions Weight Bearing Restrictions: No   Pertinent Vitals/Pain O2 98 on nasal cannula in room; 96 while ambulating room air; 94 at lowest during ambulation       Mobility  Transfers Transfers: Sit to Stand;Stand to Sit Sit to Stand: 5: Supervision Stand to Sit: 5: Supervision Details for Transfer Assistance: Performed x3 from [facility] to bed, bed to chair, and chair to toliet. Pt. requires supervision for safety. Ambulation/Gait Ambulation/Gait Assistance: 4: Min guard Ambulation Distance (Feet): 210 Feet Assistive device: None Ambulation/Gait Assistance Details: Pt. required 1 rest break. Gait Pattern: Step-through pattern;Decreased stride length Gait velocity: decreased           PT Diagnosis: Difficulty walking  PT Problem List: Decreased activity tolerance;Decreased mobility PT Treatment Interventions: Gait training;Stair training;Functional mobility training;Therapeutic activities;Therapeutic exercise;Patient/family education   PT Goals Acute Rehab PT Goals PT Goal Formulation: With patient Time For Goal Achievement: 05/08/12 Potential to Achieve Goals: Good Pt will  go Sit to Stand: with modified independence PT Goal: Sit to Stand - Progress: Goal set today Pt will go Stand to Sit: with modified independence PT Goal: Stand to Sit - Progress: Goal set today Pt will Ambulate: >150 feet;with modified independence PT Goal: Ambulate - Progress: Goal set today Pt will Go Up / Down Stairs: 1-2 stairs;with modified independence;with rail(s) PT Goal: Up/Down Stairs - Progress: Goal set today Pt will Perform Home Exercise Program: Independently PT Goal: Perform Home Exercise Program - Progress: Goal set today  Visit Information  Last PT Received On: 05/01/12 Assistance Needed: +1    Subjective Data  Subjective: "I'm on the potty." Patient Stated Goal: Return home   Prior Functioning  Home Living Lives With: Alone Available Help at Discharge: Family (Sister and daughter-in-law are able to check on pt.) Type of Home: House Home Access: Stairs to enter Technical brewer of Steps: 2 Entrance Stairs-Rails: Right Home Layout: One level Bathroom Shower/Tub: Chiropodist: Standard Home Adaptive Equipment: Crutches;Hand-held shower hose Additional Comments: Pt. has 1 lofstrand crutch that she has used as cane last 3 months; uses step stool in shower as chair; had rasied toliet seat but gave to her sister because she needed it. Prior Function Level of Independence: Independent Driving: Yes Vocation: Retired Corporate investment banker: No difficulties    Cognition  Overall Cognitive Status: Appears within functional limits for tasks assessed/performed Arousal/Alertness: Awake/alert Orientation Level: Appears intact for tasks assessed Behavior During Session: [facility] for tasks performed          End of Session PT - End of Session Activity Tolerance: Patient tolerated treatment well Patient left: with call bell/phone within reach (On  toliet; pt. instructed to call nursing to get back to bed) Nurse Communication: Mobility status     Earma Reading SPT 05/01/2012, 2:17 PM

## 2012-05-01 NOTE — Progress Notes (Signed)
Patient ID: [name], female   DOB: 03/09/1934, 78 y.o.   MRN: [id number]    SUBJECTIVE: Started po torsemide yesterday and turned down milrinone.  I/Os nearly even.  No complaints this morning.  Has not walked yet.    RHC (on milrinone 0.25) RA 19  RV 47/18  PA 51/24, mean 36  PCWP mean 26  Oxygen saturations (on 4L [place]):  PA 51%  AO 96%  Cardiac Output (Fick) 4.26  Cardiac Index (Fick) 2.24  Cardiac Output (Thermo) 5.19  Cardiac Index (Thermo) 2.73  PVR 2.3 WU (using Fick CO)      . albuterol  2.5 mg Nebulization Q6H  . antiseptic oral rinse  15 mL Mouth Rinse BID  . aspirin EC  81 mg Oral Daily  . atorvastatin  10 mg Oral q1800  . carvedilol  6.25 mg Oral BID WC  . citalopram  20 mg Oral Daily  . clopidogrel  75 mg Oral Q breakfast  . heparin subcutaneous  5,000 Units Subcutaneous Q8H  . hydrALAZINE  25 mg Oral Q8H  . insulin aspart  0-15 Units Subcutaneous TID WC  . insulin aspart  0-5 Units Subcutaneous QHS  . insulin glargine  50 Units Subcutaneous BID  . isosorbide dinitrate  20 mg Oral Q8H  . pantoprazole  40 mg Oral Q1200  . potassium chloride  40 mEq Oral Daily  . [COMPLETED] potassium chloride  40 mEq Oral Once  . sodium chloride  10-40 mL Intracatheter Q12H  . sodium chloride  3 mL Intravenous Q12H  . spironolactone  25 mg Oral Daily  . torsemide  80 mg Oral Daily  . [DISCONTINUED] hydrALAZINE  12.5 mg Oral Q8H  . [DISCONTINUED] isosorbide dinitrate  10 mg Oral Q8H  . [DISCONTINUED] potassium chloride  40 mEq Oral Once  milrinone @ 0.125    Filed Vitals:   04/30/12 2033 04/30/12 2128 05/01/12 0545 05/01/12 0735  BP:  103/44 109/50   Pulse:  84 74   Temp:  98.7 F (37.1 C) 98.3 F (36.8 C)   TempSrc:  Oral Oral   Resp:  20 18   Height:      Weight:   173 lb 9.6 oz (78.744 kg)   SpO2: 91% 95% 100% 97%    Intake/Output Summary (Last 24 hours) at 05/01/12 0743 Last data filed at 05/01/12 0554  Gross per 24 hour  Intake 1747.93 ml    Output   1676 ml  Net  71.93 ml    LABS: Basic Metabolic Panel:  Basename 05/01/12 0500 04/30/12 0500  NA 139 139  K 4.0 3.3*  CL 98 96  CO2 36* 36*  GLUCOSE 95 185*  BUN 30* 21  CREATININE 1.23* 1.12*  CALCIUM 9.2 9.1  MG -- --  PHOS -- --   Liver Function Tests: No results found for this basename: AST:2,ALT:2,ALKPHOS:2,BILITOT:2,PROT:2,ALBUMIN:2 in the last 72 hours No results found for this basename: LIPASE:2,AMYLASE:2 in the last 72 hours CBC: No results found for this basename: WBC:2,NEUTROABS:2,HGB:2,HCT:2,MCV:2,PLT:2 in the last 72 hours Cardiac Enzymes: No results found for this basename: CKTOTAL:3,CKMB:3,CKMBINDEX:3,TROPONINI:3 in the last 72 hours BNP: No components found with this basename: POCBNP:3 D-Dimer: No results found for this basename: DDIMER:2 in the last 72 hours Hemoglobin A1C: No results found for this basename: HGBA1C in the last 72 hours Fasting Lipid Panel: No results found for this basename: CHOL,HDL,LDLCALC,TRIG,CHOLHDL,LDLDIRECT in the last 72 hours Thyroid Function Tests: No results found for this basename: TSH,T4TOTAL,FREET3,T3FREE,THYROIDAB in the  last 72 hours Anemia Panel: No results found for this basename: VITAMINB12,FOLATE,FERRITIN,TIBC,IRON,RETICCTPCT in the last 72 hours  RADIOLOGY: Dg Chest Port 1 View  04/25/2012  *RADIOLOGY REPORT*  Clinical Data: CHF  PORTABLE CHEST - 1 VIEW  Comparison: 04/16/2012  Findings: Cardiomegaly noted with improving interstitial edema. Left subclavian AICD / pacer noted.  No enlarging effusion or pneumothorax.  IMPRESSION: Improving interstitial edema pattern   Original Report Authenticated By: Jerilynn Mages. Daryll Brod, M.D.     PHYSICAL EXAM General: NAD Neck: JVP 10 cm, no thyromegaly or thyroid nodule.  Lungs: Crackles at bases bilaterally CV: Lateral PMI.  Heart regular S1/S2, no S3/S4, 1/6 HSM apex.  No edema.  No carotid bruit.  Abdomen: Soft, nontender, no hepatosplenomegaly, no distention.   Neurologic: Alert and oriented x 3.  Psych: Normal affect. Extremities: No clubbing or cyanosis.   TELEMETRY: Reviewed telemetry pt in NSR with biv pacing  ASSESSMENT AND PLAN:  78 yo with history of ischemic CMP and acute on chronic systolic CHF presented with dyspnea with minimal exertion and was found to be severely volume overloaded.   1. Acute on chronic systolic CHF: EF [id number] on last echo, has CRT-D device.  Volume status improved and weight down on milrinone/lasix gtt.  Still some volume overload but better.   - Stop milrinone gtt  - Continue torsemide 80 mg po daily.  - Increase hydralazine to 25 mg q8 and isordil to 20 mg q8.  2. CAD: Stable with no chest pain.  Continue ASA, Plavix, statin.  3. CKD: Suspect cardiorenal component.  Mild increase in creatinine with drop in milrinone dose.  4. Ambulate today, PT consult.  Plan home with home health RN likely tomorrow.   [name] 05/01/2012 7:43 AM

## 2012-05-01 NOTE — Evaluation (Signed)
Seen and agree with SPt note [name], [name]

## 2012-05-01 NOTE — Plan of Care (Signed)
Problem: Phase I Progression Outcomes Goal: EF % per last Echo/documented,Core Reminder form on chart Outcome: Completed/Met Date Met:  05/01/12 Echo done July 2013 showed EF 60-65% Shellee Milo, RN

## 2012-05-02 ENCOUNTER — Encounter (HOSPITAL_COMMUNITY): Payer: Self-pay | Admitting: Cardiology

## 2012-05-02 DIAGNOSIS — I5043 Acute on chronic combined systolic (congestive) and diastolic (congestive) heart failure: Secondary | ICD-10-CM

## 2012-05-02 LAB — GLUCOSE, CAPILLARY: Glucose-Capillary: 184 mg/dL — ABNORMAL HIGH (ref 70–99)

## 2012-05-02 LAB — BASIC METABOLIC PANEL
CO2: 35 mEq/L — ABNORMAL HIGH (ref 19–32)
Calcium: 9.7 mg/dL (ref 8.4–10.5)
Creatinine, Ser: 1.32 mg/dL — ABNORMAL HIGH (ref 0.50–1.10)
GFR calc non Af Amer: 38 mL/min — ABNORMAL LOW (ref >90)
Glucose, Bld: 101 mg/dL — ABNORMAL HIGH (ref 70–99)

## 2012-05-02 MED ORDER — SPIRONOLACTONE 25 MG PO TABS: 25.0000 mg | ORAL_TABLET | Freq: Every day | ORAL | Status: DC

## 2012-05-02 MED ORDER — ISOSORBIDE DINITRATE 20 MG PO TABS: 20.0000 mg | ORAL_TABLET | Freq: Three times a day (TID) | ORAL | Status: DC

## 2012-05-02 MED ORDER — TORSEMIDE 20 MG PO TABS: 80.0000 mg | ORAL_TABLET | Freq: Every day | ORAL | Status: DC

## 2012-05-02 MED ORDER — HYDRALAZINE HCL 25 MG PO TABS: 25.0000 mg | ORAL_TABLET | Freq: Three times a day (TID) | ORAL | Status: DC

## 2012-05-02 MED ORDER — CARVEDILOL 6.25 MG PO TABS: 6.2500 mg | ORAL_TABLET | Freq: Two times a day (BID) | ORAL | Status: DC

## 2012-05-02 NOTE — Discharge Summary (Signed)
Discharge Summary   Patient ID: MEMORY BANNON MRN: [id number], DOB/AGE: 08-11-1933 78 y.o.  Primary MD: Deloria Lair, MD Primary Cardiologist: Johnny Bridge, MD Admit date: 04/25/2012 D/C date:     05/02/2012      Primary Discharge Diagnoses:  1. Acute on chronic combined systolic/diastolic CHF  - EF 123456, has CRT-D device  - Volume status improved with milrinone/lasix drips  - Weight 177-->172lbs  - Dc'd on Coreg 6.25 mg bid, hydralazine 25 mg tid, isordil 20 mg tid, spironolactone 25 mg daily, torsemide 80 mg daily  2. Acute on Chronic CKD  - Suspect cardiorenal component, Crt 1.32 at dc  - No ACEI  - F/u BMET   Secondary Discharge Diagnoses:  . Type 2 diabetes mellitus   . Anxiety disorder   . GERD (gastroesophageal reflux disease)   . Hyperlipidemia     H/o GI intolerance to Lipitor  . Ischemic cardiomyopathy     s/p Medtronic BiV ICD 2006  . Systolic and diastolic CHF, chronic     echo 03/2012 EF 123456, mod diastolic dysfunction, mod MR, mod LAE, mod-severe TR, PASP 103mmHg  . Impingement syndrome of left shoulder   . Aneurysm     Apical; previously on Coumadin  . Chronic renal insufficiency     Creatinine 1.17 January 2010  . Peripheral polyneuropathy     Refractory to several medications  . Coronary artery disease     Cath 02/15/12 revealed RCA as culprit lesion w/ occlusion at site of prior stent, patent LAD stent, chronic LCx and Diagonal dz; Medical therapy   . Depression      Allergies Allergies  Allergen Reactions  . Metformin     Upset stomach  . Sulfonamide Derivatives     swelling    Diagnostic Studies/Procedures:   04/27/12 - Right Heart Cath Hemodynamics (mmHg)  RA 19  RV 47/18  PA 51/24, mean 36  PCWP mean 26  Oxygen saturations (on 4L [place]):  PA 51%  AO 96%  Cardiac Output (Fick) 4.26  Cardiac Index (Fick) 2.24  Cardiac Output (Thermo) 5.19  Cardiac Index (Thermo) 2.73  PVR 2.3 WU (using Fick CO)  Final Conclusions:  Elevated right and left heart filling pressures. She did not diurese well yesterday, plan to give Lasix 80 mg IV x 1 followed by Lasix gtt @ 10 mg/hr.     History of Present Illness: 78 y.o. female w/ the above medical problems who was admitted from the clinic to [facility] on 04/25/12 with complaints of shortness of breath and marked volume overload.  Hospital Course: She presented to [facility] in stable condition, but markedly volume overloaded. CXR showed mild interstitial pulmonary edema. Labs were significant for pBNP 4659, BUN/Crt 36/1.45, K+ 4.5, nl LFTs. It was suspected she had low output heart failure for which she was placed on IV lasix and a milrinone drip. Coreg was decreased for soft BPs and spironolactone was added. Right heart cath showed elevated right and left heart filling pressures. She tolerated the procedure well without complications. Recommendations were made for changing lasix boluses to Lasix drip and continuing the milrinone drip. She diuresed well with a total of 8L and weight 177-->172lbs . The lasix drip was stopped and she was started on torsemide, hydralazine, and isordil. The milrinone drip was stopped with mild increase in creatinine. Her volume status and respiratory status were much improved and she was able to ambulate on room air with good oxygenation. She was seen  and evaluated by Dr. Aundra Dubin who felt she was stable for discharge home with plans for follow up as scheduled below. She will continue to receive home health RN and PT by advanced home care.  Discharge Vitals: Blood pressure 112/46, pulse 80, temperature 98.4 F (36.9 C), temperature source Oral, resp. rate 18, height 5\' 6"  (1.676 m), weight 172 lb 6.4 oz (78.2 kg), SpO2 96.00%.  Labs: Component Value Date   WBC 4.1 04/28/2012   HGB 9.7* 04/28/2012   HCT 31.1* 04/28/2012   MCV 87.6 04/28/2012   PLT 125* 04/28/2012    Lab 05/02/12 0500 04/25/12 1828  NA 138 --  K 4.0 --  CL 96 --    CO2 35* --  BUN 31* --  CREATININE 1.32* --  CALCIUM 9.7 --  PROT -- 7.7  BILITOT -- 0.9  ALKPHOS -- 58  ALT -- 12  AST -- 24  GLUCOSE 101* --     04/25/2012 18:30  Pro B Natriuretic peptide (BNP) 4659.0 (H)     04/25/2012 18:28  Hemoglobin A1C 7.6 (H)    Discharge Medications     Medication List     As of 05/02/2012  9:58 AM    STOP taking these medications         potassium chloride SA 20 MEQ tablet   Commonly known as: K-DUR,KLOR-CON      TAKE these medications         albuterol 108 (90 BASE) MCG/ACT inhaler   Commonly known as: PROVENTIL HFA;VENTOLIN HFA   Inhale 2 puffs into the lungs every 6 (six) hours as needed. For shortness of breath      ALPRAZolam 0.5 MG tablet   Commonly known as: XANAX   Take 0.5 mg by mouth at bedtime as needed. For anxiety/sleep      aspirin 81 MG tablet   Take 81 mg by mouth daily.      carvedilol 6.25 MG tablet   Commonly known as: COREG   Take 1 tablet (6.25 mg total) by mouth 2 (two) times daily with a meal.      citalopram 20 MG tablet   Commonly known as: CELEXA   Take 20 mg by mouth at bedtime.      clopidogrel 75 MG tablet   Commonly known as: PLAVIX   Take 1 tablet (75 mg total) by mouth daily with breakfast.      fexofenadine 180 MG tablet   Commonly known as: ALLEGRA   Take 180 mg by mouth daily as needed. For allergy      folic acid A999333 MCG tablet   Commonly known as: FOLVITE   Take 400 mcg by mouth daily.      hydrALAZINE 25 MG tablet   Commonly known as: APRESOLINE   Take 1 tablet (25 mg total) by mouth every 8 (eight) hours.      HYDROcodone-acetaminophen 10-650 MG per tablet   Commonly known as: LORCET   Take 1 tablet by mouth every 6 (six) hours as needed. For pain      insulin aspart 100 UNIT/ML injection   Commonly known as: novoLOG   Inject 10 Units into the skin 3 (three) times daily as needed. Sliding scale      insulin glargine 100 UNIT/ML injection   Commonly known as: LANTUS    Inject 50 Units into the skin 2 (two) times daily.      isosorbide dinitrate 20 MG tablet   Commonly known as: ISORDIL  Take 1 tablet (20 mg total) by mouth every 8 (eight) hours.      nitroGLYCERIN 0.4 MG SL tablet   Commonly known as: NITROSTAT   Place 1 tablet (0.4 mg total) under the tongue every 5 (five) minutes as needed for chest pain (up to 3 doses).      pantoprazole 40 MG tablet   Commonly known as: PROTONIX   Take 1 tablet (40 mg total) by mouth daily at 12 noon.      rosuvastatin 5 MG tablet   Commonly known as: CRESTOR   Take 1 tablet (5 mg total) by mouth daily.      spironolactone 25 MG tablet   Commonly known as: ALDACTONE   Take 1 tablet (25 mg total) by mouth daily.      torsemide 20 MG tablet   Commonly known as: DEMADEX   Take 4 tablets (80 mg total) by mouth daily.      vitamin B-12 1000 MCG tablet   Commonly known as: CYANOCOBALAMIN   Take 1,000 mcg by mouth daily. Take 1 tablet by mouth daily.         Disposition   Discharge Orders    Future Appointments: Provider: Department: Dept Phone: Center:   05/09/2012 10:00 AM Aurora Mask, PA [address] [place] (near [place]) [phone] [email or web address]   07/02/2012 11:40 AM [facility] [place] [facility] [place]) [phone] [email or web address]     Future Orders Please Complete By Expires   Diet - low sodium heart healthy      Increase activity slowly      Discharge instructions      Scheduling Instructions:   * KEEP GROIN SITE CLEAN AND DRY. Call the office for any signs of bleedings, pus, swelling, increased pain, or any other concerns. * NO HEAVY LIFTING (>10lbs) OR SEXUAL ACTIVITY X 2 DAYS. * NO SOAKING BATHS, HOT TUBS, POOLS, ETC., X 2 DAYS.  * Please take all medications as prescribed and bring them with you to your office visits.  * Please have your blood drawn on 11/11 to monitor your kidney function and electrolytes and have the results sent to the [facility]  office in [place] attention [name], PA-C     Follow-up Information    Follow up with SERPE, EUGENE, PA. On 05/09/2012. (10:00)    Contact information:   [place] [address] [place] [address] [phone]       Follow up with [facility]. On 05/07/2012. (Please have your blood drawn at [facility] and have results sent to the [facility] office in [place])    Contact information:   [place] [phone]           Outstanding Labs/Studies:  BMET  Duration of Discharge Encounter: Greater than 30 minutes including physician and PA time.  Signed, Arlee Santosuosso PA-C 05/02/2012, 9:58 AM

## 2012-05-02 NOTE — Progress Notes (Signed)
Reviewed discharge instructions with patient. Stated understanding.  No voiced complaints. Emiliano Dyer, RN

## 2012-05-02 NOTE — Progress Notes (Addendum)
Patient ID: [name], female   DOB: 1934-04-22, 78 y.o.   MRN: [id number]     SUBJECTIVE: Oxygen off, saturation ok with ambulation on room air yesterday.  Milrinone off.  She did well yesterday off milrinone and on po torsemide.  She was able to walk in the hall with PT yesterday, said she could not have done this prior to admission.   RHC (on milrinone 0.25) RA 19  RV 47/18  PA 51/24, mean 36  PCWP mean 26  Oxygen saturations (on 4L [place]):  PA 51%  AO 96%  Cardiac Output (Fick) 4.26  Cardiac Index (Fick) 2.24  Cardiac Output (Thermo) 5.19  Cardiac Index (Thermo) 2.73  PVR 2.3 WU (using Fick CO)      . albuterol  2.5 mg Nebulization Q6H  . antiseptic oral rinse  15 mL Mouth Rinse BID  . aspirin EC  81 mg Oral Daily  . atorvastatin  10 mg Oral q1800  . carvedilol  6.25 mg Oral BID WC  . citalopram  20 mg Oral Daily  . clopidogrel  75 mg Oral Q breakfast  . heparin subcutaneous  5,000 Units Subcutaneous Q8H  . hydrALAZINE  25 mg Oral Q8H  . insulin aspart  0-15 Units Subcutaneous TID WC  . insulin aspart  0-5 Units Subcutaneous QHS  . insulin glargine  50 Units Subcutaneous BID  . isosorbide dinitrate  20 mg Oral Q8H  . pantoprazole  40 mg Oral Q1200  . potassium chloride  40 mEq Oral Daily  . sodium chloride  10-40 mL Intracatheter Q12H  . sodium chloride  3 mL Intravenous Q12H  . spironolactone  25 mg Oral Daily  . torsemide  80 mg Oral Daily  . [DISCONTINUED] hydrALAZINE  12.5 mg Oral Q8H  . [DISCONTINUED] isosorbide dinitrate  10 mg Oral Q8H     Filed Vitals:   05/01/12 2100 05/01/12 2136 05/01/12 2148 05/02/12 0531  BP: 105/37 104/42 105/49 107/40  Pulse: 82 84 88 74  Temp: 98.7 F (37.1 C) 97.8 F (36.6 C)  98.4 F (36.9 C)  TempSrc: Oral Oral  Oral  Resp: 20 19  18   Height:      Weight:    172 lb 6.4 oz (78.2 kg)  SpO2: 93% 93%  93%    Intake/Output Summary (Last 24 hours) at 05/02/12 0704 Last data filed at 05/02/12 0532  Gross per 24  hour  Intake   1080 ml  Output   1550 ml  Net   -470 ml    LABS: Basic Metabolic Panel:  Basename 05/02/12 0500 05/01/12 0500  NA 138 139  K 4.0 4.0  CL 96 98  CO2 35* 36*  GLUCOSE 101* 95  BUN 31* 30*  CREATININE 1.32* 1.23*  CALCIUM 9.7 9.2  MG -- --  PHOS -- --   Liver Function Tests: No results found for this basename: AST:2,ALT:2,ALKPHOS:2,BILITOT:2,PROT:2,ALBUMIN:2 in the last 72 hours No results found for this basename: LIPASE:2,AMYLASE:2 in the last 72 hours CBC: No results found for this basename: WBC:2,NEUTROABS:2,HGB:2,HCT:2,MCV:2,PLT:2 in the last 72 hours Cardiac Enzymes: No results found for this basename: CKTOTAL:3,CKMB:3,CKMBINDEX:3,TROPONINI:3 in the last 72 hours BNP: No components found with this basename: POCBNP:3 D-Dimer: No results found for this basename: DDIMER:2 in the last 72 hours Hemoglobin A1C: No results found for this basename: HGBA1C in the last 72 hours Fasting Lipid Panel: No results found for this basename: CHOL,HDL,LDLCALC,TRIG,CHOLHDL,LDLDIRECT in the last 72 hours Thyroid Function Tests: No results found  for this basename: TSH,T4TOTAL,FREET3,T3FREE,THYROIDAB in the last 72 hours Anemia Panel: No results found for this basename: VITAMINB12,FOLATE,FERRITIN,TIBC,IRON,RETICCTPCT in the last 72 hours  RADIOLOGY: Dg Chest Port 1 View  04/25/2012  *RADIOLOGY REPORT*  Clinical Data: CHF  PORTABLE CHEST - 1 VIEW  Comparison: 04/16/2012  Findings: Cardiomegaly noted with improving interstitial edema. Left subclavian AICD / pacer noted.  No enlarging effusion or pneumothorax.  IMPRESSION: Improving interstitial edema pattern   Original Report Authenticated By: Jerilynn Mages. Daryll Brod, M.D.     PHYSICAL EXAM General: NAD Neck: JVP 8-9 cm, no thyromegaly or thyroid nodule.  Lungs: Crackles at bases bilaterally CV: Lateral PMI.  Heart regular S1/S2, no S3/S4, 1/6 HSM apex.  No edema.  No carotid bruit.  Abdomen: Soft, nontender, no  hepatosplenomegaly, no distention.  Neurologic: Alert and oriented x 3.  Psych: Normal affect. Extremities: No clubbing or cyanosis.   TELEMETRY: Reviewed telemetry pt in NSR with biv pacing  ASSESSMENT AND PLAN:  78 yo with history of ischemic CMP and acute on chronic systolic CHF presented with dyspnea with minimal exertion and was found to be severely volume overloaded.   1. Acute on chronic systolic CHF: EF [id number] on last echo, has CRT-D device.  Volume status improved and weight down on milrinone/lasix gtt.  Still some volume overload but better.   She is now on po torsemide and has been off milrinone for 24 hrs.  We have started hydralazine/isordil for afterload reduction.  Avoiding ACEI for now with CKD.  She is breathing better.  2. CAD: Stable with no chest pain.  Continue ASA, Plavix, statin.  3. CKD: Suspect cardiorenal component.  Mild increase in creatinine after stopping milrinone.  4. Disposition: Plan for home today with home health RN and home PT.  She needs followup early next week in the [facility] cardiology office with BMET.  Meds: ASA 81, Plavix 75, Coreg 6.25 mg bid, hydralazine 25 mg tid, isordil 20 mg tid, spironolactone 25 mg daily, torsemide 80 mg daily, atorvastatin 10 mg daily.  She wiill not go home on home oxygen or on supplemental KCl.   [name] 05/02/2012 7:04 AM

## 2012-05-02 NOTE — Progress Notes (Signed)
Patient had 3 beats of PVC's.  Patient asymptomatic.  MD Mclean aware and no new orders placed. RN will continue to monitor. Shellee Milo, RN

## 2012-05-02 NOTE — Plan of Care (Signed)
Problem: Discharge Progression Outcomes Goal: Able to perform self care activities Outcome: Adequate for Discharge Home Health resumed.

## 2012-05-08 ENCOUNTER — Encounter: Payer: Self-pay | Admitting: Cardiology

## 2012-05-08 ENCOUNTER — Ambulatory Visit (INDEPENDENT_AMBULATORY_CARE_PROVIDER_SITE_OTHER): Payer: Medicare Other | Admitting: Cardiology

## 2012-05-08 VITALS — BP 92/52 | HR 72 | Ht 66.0 in | Wt 172.0 lb

## 2012-05-08 DIAGNOSIS — I2589 Other forms of chronic ischemic heart disease: Secondary | ICD-10-CM

## 2012-05-08 DIAGNOSIS — I5022 Chronic systolic (congestive) heart failure: Secondary | ICD-10-CM

## 2012-05-08 DIAGNOSIS — R0602 Shortness of breath: Secondary | ICD-10-CM

## 2012-05-08 DIAGNOSIS — I255 Ischemic cardiomyopathy: Secondary | ICD-10-CM

## 2012-05-08 DIAGNOSIS — N189 Chronic kidney disease, unspecified: Secondary | ICD-10-CM

## 2012-05-08 DIAGNOSIS — I509 Heart failure, unspecified: Secondary | ICD-10-CM

## 2012-05-08 DIAGNOSIS — E785 Hyperlipidemia, unspecified: Secondary | ICD-10-CM

## 2012-05-08 DIAGNOSIS — I2510 Atherosclerotic heart disease of native coronary artery without angina pectoris: Secondary | ICD-10-CM

## 2012-05-08 DIAGNOSIS — Z79899 Other long term (current) drug therapy: Secondary | ICD-10-CM

## 2012-05-08 MED ORDER — POTASSIUM CHLORIDE CRYS ER 20 MEQ PO TBCR: EXTENDED_RELEASE_TABLET | ORAL | Status: DC

## 2012-05-08 MED ORDER — METOLAZONE 2.5 MG PO TABS: ORAL_TABLET | ORAL | Status: DC

## 2012-05-08 MED ORDER — METOLAZONE 2.5 MG PO TABS: 2.5000 mg | ORAL_TABLET | Freq: Every day | ORAL | Status: DC

## 2012-05-08 NOTE — Patient Instructions (Addendum)
Begin Metolazone 2.5mg  - one tab twice a week on Wednesday & Saturday with Potassium 26meq 30 minutes before Torsemide Labs:  BMET, BNP, Lipids - HH can draw fasting in one week after second dose of Metolazone Continue all other current medications.  Follow up in  10 days

## 2012-05-08 NOTE — Progress Notes (Signed)
Patient ID: [name], female   DOB: [birth year], 78 y.o.   MRN: [id number] PCP: Dr. Scotty Court  78 yo with history of diabetes, CAD, and ischemic cardiomyopathy presents for cardiology followup after recent admission for acute on chronic systolic CHF exacerbation.  Patient was admitted in 11/13 with severe volume overload and was started on milrinone and Lasix gtt.  RHC showed significantly elevated left and right heart filling pressures.  Weight is down 6 lbs today compared to prior to admission.  She feels considerably better and is no longer using home oxygen.  She has a home health nurse coming in.  She is not short of breath walking around her house now but does get short of breath bathing and getting dressed.  Dyspnea with walking longer distances outdoors.  Overall improved symptoms but still quite limited.  No chest pain, no lightheadedness.  SBP in the 90s.    Optivol was checked today on her device.  This shows that she has been above threshold for fluid index most of the time since 8/13 and is still above threshold.  Impedance is trending down.   Labs (11/13): K 4.4, creatinine 1.26, BUN 24  PMH: 1. Depression 2. Type II diabetes with peripheral neuropathy.  3. Hyperlipidemia: GI intolerance to Lipitor. 4. H/o cholecystectomy 5. H/o hysterectomy 6. CKD 7. GERD 8. Ischemic cardiomyopathy: Echo (10/13) mildly dilated LV with EF 35-40%, inferior and inferoseptal severe hypokinesis, grade II diastolic dysfunction, moderate MR, mildly dilated RV, moderate to severe TR, PA systolic pressure 43 mmHg.  Patient has Medtronic CRT-D device.  RHC (11/13) mean RA 19, PA 51/24, mean PCWP 26, CI 2.24 (Fick)/2.73 (thermo).  9. H/o multiple defibrillator shocks after lead fracture.  10. 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  11. CAD: History of PCI to proximal and mid RCA and to proximal LAD.  Patient had NSTEMI in 8/13 with late presentation to hospital.  LHC showed 50%  ostial LAD, patent LAD stent, 50% ostial D2, total occlusion of RCA at stent site (culprit lesion), chronic total occlusion of LCx.  Patient was medically managed given resolution of chest pain and late presentation.   SH: Lives alone in [place].  Nonsmoker.  1 son.    FH: CAD  ROS: All systems reviewed and negative except as per HPI.   Current Outpatient Prescriptions  Medication Sig Dispense Refill  . albuterol (PROVENTIL HFA;VENTOLIN HFA) 108 (90 BASE) MCG/ACT inhaler Inhale 2 puffs into the lungs every 6 (six) hours as needed. For shortness of breath      . ALPRAZolam (XANAX) 0.5 MG tablet Take 0.5 mg by mouth at bedtime as needed. For anxiety/sleep      . aspirin 81 MG tablet Take 81 mg by mouth every evening.       . carvedilol (COREG) 6.25 MG tablet Take 1 tablet (6.25 mg total) by mouth 2 (two) times daily with a meal.  60 tablet  6  . citalopram (CELEXA) 20 MG tablet Take 20 mg by mouth at bedtime.       . clopidogrel (PLAVIX) 75 MG tablet Take 1 tablet (75 mg total) by mouth daily with breakfast.  30 tablet  6  . fexofenadine (ALLEGRA) 180 MG tablet Take 180 mg by mouth daily as needed. For allergy      . folic acid (FOLVITE) A999333 MCG tablet Take 400 mcg by mouth daily.      . hydrALAZINE (APRESOLINE) 25 MG tablet Take 1 tablet (  25 mg total) by mouth every 8 (eight) hours.  90 tablet  6  . HYDROcodone-acetaminophen (LORCET) 10-650 MG per tablet Take 1 tablet by mouth every 6 (six) hours as needed. For pain      . insulin aspart (NOVOLOG) 100 UNIT/ML injection Inject 10 Units into the skin 3 (three) times daily as needed. Sliding scale      . insulin glargine (LANTUS) 100 UNIT/ML injection Inject 50 Units into the skin 2 (two) times daily.      . isosorbide dinitrate (ISORDIL) 20 MG tablet Take 1 tablet (20 mg total) by mouth every 8 (eight) hours.  90 tablet  6  . nitroGLYCERIN (NITROSTAT) 0.4 MG SL tablet Place 1 tablet (0.4 mg total) under the tongue every 5 (five) minutes as needed  for chest pain (up to 3 doses).  25 tablet  3  . pantoprazole (PROTONIX) 40 MG tablet Take 1 tablet (40 mg total) by mouth daily at 12 noon.  30 tablet  2  . rosuvastatin (CRESTOR) 5 MG tablet Take 5 mg by mouth every evening.      Marland Kitchen spironolactone (ALDACTONE) 25 MG tablet Take 25 mg by mouth every morning.      . torsemide (DEMADEX) 20 MG tablet Take 80 mg by mouth every morning.      . vitamin B-12 (CYANOCOBALAMIN) 1000 MCG tablet Take 1,000 mcg by mouth daily. Take 1 tablet by mouth daily.      . [DISCONTINUED] rosuvastatin (CRESTOR) 5 MG tablet Take 1 tablet (5 mg total) by mouth daily.  30 tablet  2  . [DISCONTINUED] spironolactone (ALDACTONE) 25 MG tablet Take 1 tablet (25 mg total) by mouth daily.  30 tablet  6  . metolazone (ZAROXOLYN) 2.5 MG tablet Take one tablet by mouth twice a week on Wednesday and Saturday  15 tablet  6  . potassium chloride SA (K-DUR,KLOR-CON) 20 MEQ tablet Take 46meq on Wednesday & Saturday with the Metolazone      . [DISCONTINUED] torsemide (DEMADEX) 20 MG tablet Take 4 tablets (80 mg total) by mouth daily.  120 tablet  6    BP 92/52  Pulse 72  Ht 5\' 6"  (1.676 m)  Wt 172 lb (78.019 kg)  BMI 27.76 kg/m2  SpO2 97% General: NAD Neck: JVP 9-10 cm, no thyromegaly or thyroid nodule.  Lungs: Slight crackles at bases bilaterally.  CV: Lateral PMI.  Heart regular S1/S2, no [id number], 2/6 systolic murmur LLSB.  No peripheral edema.  No carotid bruit.   Abdomen: Soft, nontender, no hepatosplenomegaly, no distention.  Neurologic: Alert and oriented x 3.  Psych: Normal affect. Extremities: No clubbing or cyanosis.   Assessment/Plan: 1. Chronic systolic CHF: EF [id number] on last echo.  Recent admission with acute/chronic systolic CHF with milrinone and Lasix gtts used.  RHC showed elevated left and right heart filling pressures.  She is doing better symptomatically but still NYHA class III and volume overloaded by exam and by Optivol interrogation.  - Continue current doses of  Coreg, hydralazine/isordil, and spironolactone.  - Holding off on ACEI for now given CKD and h/o AKI.  - Continue torsemide 80 mg daily.  Given ongoing volume overload, I will add metolazone 2.5 mg to be taken 1/2 hour before torsemide twice a week (Wednesday and Saturday).  She will take KCl 40 mEq daily on days she takes metolazone.  - Needs close followup: she will need BMET/BNP in about a week and followup in this office with me or [name]  in 10 days.  2. CAD: Stable with no ischemic symptoms.  Last cath showed occluded RCA and LCx, medical management.  Continue statin, ASA 81.  Needs lipids (fasting) with next BMET.  3. CKD: Stable K and creatinine yesterday.  As above, repeat in about a week (home health RN to draw).   [name] 05/08/2012 12:55 PM

## 2012-05-09 ENCOUNTER — Encounter: Payer: Medicare Other | Admitting: Physician Assistant

## 2012-05-18 ENCOUNTER — Encounter: Payer: Self-pay | Admitting: Physician Assistant

## 2012-05-18 ENCOUNTER — Ambulatory Visit (INDEPENDENT_AMBULATORY_CARE_PROVIDER_SITE_OTHER): Payer: Medicare Other | Admitting: Physician Assistant

## 2012-05-18 VITALS — BP 102/63 | HR 75 | Ht 66.0 in | Wt 161.8 lb

## 2012-05-18 DIAGNOSIS — E785 Hyperlipidemia, unspecified: Secondary | ICD-10-CM

## 2012-05-18 DIAGNOSIS — N189 Chronic kidney disease, unspecified: Secondary | ICD-10-CM

## 2012-05-18 DIAGNOSIS — I5022 Chronic systolic (congestive) heart failure: Secondary | ICD-10-CM

## 2012-05-18 DIAGNOSIS — I2589 Other forms of chronic ischemic heart disease: Secondary | ICD-10-CM

## 2012-05-18 DIAGNOSIS — I255 Ischemic cardiomyopathy: Secondary | ICD-10-CM

## 2012-05-18 DIAGNOSIS — R0602 Shortness of breath: Secondary | ICD-10-CM

## 2012-05-18 MED ORDER — HYDRALAZINE HCL 25 MG PO TABS: 25.0000 mg | ORAL_TABLET | Freq: Two times a day (BID) | ORAL | Status: DC

## 2012-05-18 NOTE — Patient Instructions (Addendum)
   Decrease Hydralazine to twice a day    Stop Zaroxolyn  Stop Potassium  Continue all other current medications. Lab on Monday, November 25 for CMET, BNP Office will contact with results Follow up in 1 month

## 2012-05-18 NOTE — Assessment & Plan Note (Signed)
Unable to further uptitrate carvedilol secondary to persistent, but stable, hypotension.

## 2012-05-18 NOTE — Assessment & Plan Note (Signed)
Followup labs from yesterday notable for a rise in creatinine from 1.3 to 1.5. We'll continue to monitor closely with followup labs early next week.

## 2012-05-18 NOTE — Addendum Note (Signed)
Addended by: Laurine Blazer on: 05/18/2012 03:32 PM   Modules accepted: Orders

## 2012-05-18 NOTE — Assessment & Plan Note (Signed)
Recent FLP indicated excellent control with an LDL of 34. Continue current level dose Crestor.

## 2012-05-18 NOTE — Progress Notes (Signed)
Primary Cardiologist: [name], MD   HPI: Patient presents for early scheduled followup, per Dr. Claris Gladden recommendation.  During recent followup visit, medications were adjusted with addition of Zaroxolyn 2.5 mg twice weekly. Followup labs on November 21, as follows:   -BUN 58, creatinine 1.5 (previously 24/1.3, respectively), and potassium 4.1. BUN 90 (370) LDL 34  She has since diuresed 11 pounds, and reports much less dyspnea with no PND, orthopnea, or peripheral edema. Unfortunately, however, she feels that she cannot tolerate the Zaroxolyn, secondary to nausea and abdominal pain. She thinks that this has precipitated her IBS, and she placed himself back on dicyclomine, with improvement in her symptoms. She also believes that the hydralazine maybe playing a role, as well. She does not recall having to take this on a 3 times a day basis in the past.    Allergies  Allergen Reactions  . Metformin     Upset stomach  . Sulfonamide Derivatives     swelling    Current Outpatient Prescriptions  Medication Sig Dispense Refill  . albuterol (PROVENTIL HFA;VENTOLIN HFA) 108 (90 BASE) MCG/ACT inhaler Inhale 2 puffs into the lungs every 6 (six) hours as needed. For shortness of breath      . ALPRAZolam (XANAX) 0.5 MG tablet Take 0.5 mg by mouth at bedtime as needed. For anxiety/sleep      . aspirin 81 MG tablet Take 81 mg by mouth every evening.       . carvedilol (COREG) 6.25 MG tablet Take 1 tablet (6.25 mg total) by mouth 2 (two) times daily with a meal.  60 tablet  6  . citalopram (CELEXA) 20 MG tablet Take 20 mg by mouth at bedtime.       . clopidogrel (PLAVIX) 75 MG tablet Take 1 tablet (75 mg total) by mouth daily with breakfast.  30 tablet  6  . dicyclomine (BENTYL) 10 MG capsule Take 10 mg by mouth 2 (two) times daily.      . fexofenadine (ALLEGRA) 180 MG tablet Take 180 mg by mouth daily as needed. For allergy      . folic acid (FOLVITE) A999333 MCG tablet Take 400 mcg by mouth daily.       . hydrALAZINE (APRESOLINE) 25 MG tablet Take 1 tablet (25 mg total) by mouth 2 (two) times daily.      Marland Kitchen HYDROcodone-acetaminophen (LORCET) 10-650 MG per tablet Take 1 tablet by mouth every 6 (six) hours as needed. For pain      . insulin aspart (NOVOLOG) 100 UNIT/ML injection Inject 10 Units into the skin 3 (three) times daily as needed. Sliding scale      . insulin glargine (LANTUS) 100 UNIT/ML injection Inject 50 Units into the skin 2 (two) times daily.      . isosorbide dinitrate (ISORDIL) 20 MG tablet Take 1 tablet (20 mg total) by mouth every 8 (eight) hours.  90 tablet  6  . nitroGLYCERIN (NITROSTAT) 0.4 MG SL tablet Place 1 tablet (0.4 mg total) under the tongue every 5 (five) minutes as needed for chest pain (up to 3 doses).  25 tablet  3  . pantoprazole (PROTONIX) 40 MG tablet Take 1 tablet (40 mg total) by mouth daily at 12 noon.  30 tablet  2  . rosuvastatin (CRESTOR) 5 MG tablet Take 5 mg by mouth every evening.      Marland Kitchen spironolactone (ALDACTONE) 25 MG tablet Take 25 mg by mouth every morning.      . torsemide (DEMADEX) 20  MG tablet Take 80 mg by mouth every morning.      . vitamin B-12 (CYANOCOBALAMIN) 1000 MCG tablet Take 1,000 mcg by mouth daily. Take 1 tablet by mouth daily.      . [DISCONTINUED] hydrALAZINE (APRESOLINE) 25 MG tablet Take 1 tablet (25 mg total) by mouth every 8 (eight) hours.  90 tablet  6  . potassium chloride SA (K-DUR,KLOR-CON) 20 MEQ tablet Take 62meq on Wednesday & Saturday with the Metolazone        Past Medical History  Diagnosis Date  . Type 2 diabetes mellitus   . Anxiety disorder   . GERD (gastroesophageal reflux disease)   . Hyperlipidemia     H/o GI intolerance to Lipitor  . Ischemic cardiomyopathy     s/p Medtronic BiV ICD 2006  . Systolic and diastolic CHF, chronic     echo 03/2012 EF 123456, mod diastolic dysfunction, mod MR, mod LAE, mod-severe TR, PASP 55mmHg  . Impingement syndrome of left shoulder   . Aneurysm     Apical;  previously on Coumadin  . Chronic renal insufficiency     Creatinine 1.17 January 2010  . Peripheral polyneuropathy     Refractory to several medications  . Coronary artery disease     Cath 02/15/12 revealed RCA as culprit lesion w/ occlusion at site of prior stent, patent LAD stent, chronic LCx and Diagonal dz; Medical therapy   . Depression     Past Surgical History  Procedure Date  . Abdominal hysterectomy   . Cardiac defibrillator placement 05/28/2009    Medtronic ICD placed by Dr. Caryl Comes secondary to ICM/CHF, [phone] Fidelis lead fracture with ICD storm 2010 requiring new RV lead placement  . Insert / replace / remove pacemaker   . Cholecystectomy   . Eye surgery     History   Social History  . Marital Status: Married    Spouse Name: N/A    Number of Children: N/A  . Years of Education: N/A   Occupational History  . Retired    Social History Main Topics  . Smoking status: Former Smoker -- 0.8 packs/day for 30 years    Types: Cigarettes    Quit date: 06/27/2002  . Smokeless tobacco: Never Used  . Alcohol Use: No  . Drug Use: Not on file  . Sexually Active: Not Currently   Other Topics Concern  . Not on file   Social History Narrative  . No narrative on file    Family History  Problem Relation Age of Onset  . Coronary artery disease Neg Hx     ROS: no nausea, vomiting; no fever, chills; no melena, hematochezia; no claudication  PHYSICAL EXAM: BP 102/63  Pulse 75  Ht 5\' 6"  (1.676 m)  Wt 161 lb 12.8 oz (73.392 kg)  BMI 26.12 kg/m2 GENERAL: 78 year old female; NAD HEENT: NCAT, PERRLA, EOMI; sclera clear; no xanthelasma NECK: palpable bilateral carotid pulses, no bruits; no JVD; no TM LUNGS: CTA bilaterally CARDIAC: RRR (S1, S2); no significant murmurs; no rubs or gallops ABDOMEN: soft, non-tender; intact BS EXTREMETIES: intact distal pulses; no significant peripheral edema SKIN: warm/dry; no obvious rash/lesions MUSCULOSKELETAL: no joint deformity NEURO: no  focal deficit; NL affect   EKG:    ASSESSMENT & PLAN:  SYSTOLIC HEART FAILURE, CHRONIC Patient has since diuresed 11 pounds, but is unable to tolerate Zaroxolyn secondary to abdominal symptoms. She took a total of only 2 doses last week, but states that she has continued to diuresis since then. She  also believes that she cannot tolerate hydralazine 3 times a day, and that this may be playing a role in her abdominal symptoms, as well. Therefore, we will DC Zaroxolyn and decrease hydralazine to twice a day dosing. Will continue Demadex at current dose, and check followup labs in the next few days, including BNP level. Will then have her return to the clinic in approximately one month, to resume followup with Dr. Aundra Dubin.  Chronic kidney disease Followup labs from yesterday notable for a rise in creatinine from 1.3 to 1.5. We'll continue to monitor closely with followup labs early next week.  HYPERLIPIDEMIA-MIXED Recent FLP indicated excellent control with an LDL of 34. Continue current level dose Crestor.  Ischemic cardiomyopathy Unable to further uptitrate carvedilol secondary to persistent, but stable, hypotension.    Gene Zeek Rostron, PAC

## 2012-05-18 NOTE — Assessment & Plan Note (Addendum)
Patient has since diuresed 11 pounds, but is unable to tolerate Zaroxolyn secondary to abdominal symptoms. She took a total of only 2 doses last week, but states that she has continued to diuresis since then. She also believes that she cannot tolerate hydralazine 3 times a day, and that this may be playing a role in her abdominal symptoms, as well. Therefore, we will DC Zaroxolyn and decrease hydralazine to twice a day dosing. Will continue Demadex at current dose, and check followup labs in the next few days, including BNP level. Of note, given that patient is on Aldactone, we will discontinue supplemental potassium. We will plan on having her return to the clinic in approximately one month, to resume followup with Dr. Aundra Dubin.

## 2012-05-21 ENCOUNTER — Telehealth: Payer: Self-pay | Admitting: *Deleted

## 2012-05-21 MED ORDER — HYDRALAZINE HCL 25 MG PO TABS: 25.0000 mg | ORAL_TABLET | Freq: Three times a day (TID) | ORAL | Status: DC

## 2012-05-21 NOTE — Telephone Encounter (Signed)
Patient notified

## 2012-05-21 NOTE — Telephone Encounter (Signed)
Message copied by Laurine Blazer on Mon May 21, 2012  4:09 PM ------      Message from: Aurora Mask C      Created: Mon May 21, 2012  8:07 AM       Dr Aundra Dubin has asked me to try to keep Ms Atlas on Hydralazine tid, rather than bid. Please let her know that this recommendation is per Dr Aundra Dubin, who thinks her sxs are probably due to [name], and not Hydralazine.

## 2012-05-22 ENCOUNTER — Encounter: Payer: Self-pay | Admitting: *Deleted

## 2012-05-29 ENCOUNTER — Telehealth: Payer: Self-pay | Admitting: *Deleted

## 2012-05-29 NOTE — Telephone Encounter (Addendum)
Patient called with questions regarding her lab numbers, glucose in particular.  Advised her that glucose was 166 & her BNP was 84.  Nurse forwarded copy to PMD Scotty Court).    Patient c/o not feeling good, weak.  Has not felt great since Saturday, runny nose.  Stated she did have chills the other night, but no fever.  Informed her that we have heard of some gi type illness going around & lots of colds / congestion.    Advised her if feeling no better in the next day or so to see PMD.  She verbalized understanding.    Follow up OV scheduled for 12/12 with Dr. Aundra Dubin.

## 2012-06-07 ENCOUNTER — Ambulatory Visit (INDEPENDENT_AMBULATORY_CARE_PROVIDER_SITE_OTHER): Payer: Medicare Other | Admitting: Cardiology

## 2012-06-07 ENCOUNTER — Encounter: Payer: Self-pay | Admitting: Cardiology

## 2012-06-07 VITALS — BP 110/70 | HR 75 | Ht 66.0 in | Wt 163.4 lb

## 2012-06-07 DIAGNOSIS — I5022 Chronic systolic (congestive) heart failure: Secondary | ICD-10-CM

## 2012-06-07 DIAGNOSIS — R0602 Shortness of breath: Secondary | ICD-10-CM

## 2012-06-07 DIAGNOSIS — N189 Chronic kidney disease, unspecified: Secondary | ICD-10-CM

## 2012-06-07 DIAGNOSIS — I2510 Atherosclerotic heart disease of native coronary artery without angina pectoris: Secondary | ICD-10-CM

## 2012-06-07 MED ORDER — HYDRALAZINE HCL 25 MG PO TABS: 37.5000 mg | ORAL_TABLET | Freq: Three times a day (TID) | ORAL | Status: DC

## 2012-06-07 NOTE — Patient Instructions (Addendum)
Your physician recommends that you schedule a follow-up appointment in: 1 month. Your physician has recommended you make the following change in your medication: Increased your hydralazine to 37.5 mg three times daily. Please take 1 &1/2 tablets three times daily now. Your new prescription has been sent to your pharmacy. All other medications will remain the same. If you gain more than 2 pounds in 24 hour period or 3 pounds in a week; please take metolazone 2.5 mg with Potassium chloride 40 meq 1 time only 30 minutes before your torsemide. Your physician recommends that you return for lab work in 1 week on 06/14/12 at [facility] for BMET and BNP.

## 2012-06-10 NOTE — Progress Notes (Signed)
Patient ID: [name], female   DOB: [birth year], 78 y.o.   MRN: [id number] PCP: Dr. Scotty Court  78 yo with history of diabetes, CAD, and ischemic cardiomyopathy presents for cardiology followup after recent admission for acute on chronic systolic CHF exacerbation.  Patient was admitted in 11/13 with severe volume overload and was started on milrinone and Lasix gtt.  RHC showed significantly elevated left and right heart filling pressures.  At a prior appointment, I started her on twice weekly metolazone due to ongoing volume overload.  She felt "sick" taking metolazone and has stopped it.  However, she has lost 9 lbs since the last appointment with me.  Overall, she is breathing better and using home oxygen less.  She is not short of breath walking around her house but continues to be short of breath with longer distances.  No PND, no chest pain, no lightheadedness.   Labs (11/13): K 4.4, creatinine 1.2 => 1.42, BUN 24, BNP 84, LDL < 70  PMH: 1. Depression 2. Type II diabetes with peripheral neuropathy.  3. Hyperlipidemia: GI intolerance to Lipitor. 4. H/o cholecystectomy 5. H/o hysterectomy 6. CKD 7. GERD 8. Ischemic cardiomyopathy: Echo (10/13) mildly dilated LV with EF 35-40%, inferior and inferoseptal severe hypokinesis, grade II diastolic dysfunction, moderate MR, mildly dilated RV, moderate to severe TR, PA systolic pressure 43 mmHg.  Patient has Medtronic CRT-D device.  RHC (11/13) mean RA 19, PA 51/24, mean PCWP 26, CI 2.24 (Fick)/2.73 (thermo).  9. H/o multiple defibrillator shocks after lead fracture.  10. 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  11. CAD: History of PCI to proximal and mid RCA and to proximal LAD.  Patient had NSTEMI in 8/13 with late presentation to hospital.  LHC showed 50% ostial LAD, patent LAD stent, 50% ostial D2, total occlusion of RCA at stent site (culprit lesion), chronic total occlusion of LCx.  Patient was medically managed given  resolution of chest pain and late presentation.   SH: Lives alone in [place].  Nonsmoker.  1 son.    FH: CAD  ROS: All systems reviewed and negative except as per HPI.   Current Outpatient Prescriptions  Medication Sig Dispense Refill  . albuterol (PROVENTIL HFA;VENTOLIN HFA) 108 (90 BASE) MCG/ACT inhaler Inhale 2 puffs into the lungs every 6 (six) hours as needed. For shortness of breath      . ALPRAZolam (XANAX) 0.5 MG tablet Take 0.5 mg by mouth at bedtime as needed. For anxiety/sleep      . aspirin 81 MG tablet Take 81 mg by mouth every evening.       . carvedilol (COREG) 6.25 MG tablet Take 1 tablet (6.25 mg total) by mouth 2 (two) times daily with a meal.  60 tablet  6  . clopidogrel (PLAVIX) 75 MG tablet Take 1 tablet (75 mg total) by mouth daily with breakfast.  30 tablet  6  . dicyclomine (BENTYL) 10 MG capsule Take 10 mg by mouth 2 (two) times daily.      . folic acid (FOLVITE) A999333 MCG tablet Take 400 mcg by mouth daily.      . hydrALAZINE (APRESOLINE) 25 MG tablet Take 1.5 tablets (37.5 mg total) by mouth 3 (three) times daily.  135 tablet  3  . HYDROcodone-acetaminophen (NORCO) 10-325 MG per tablet Take 1 tablet by mouth every 6 (six) hours as needed.      . insulin glargine (LANTUS) 100 UNIT/ML injection Inject 50 Units into the skin 2 (  two) times daily.      . isosorbide dinitrate (ISORDIL) 20 MG tablet Take 1 tablet (20 mg total) by mouth every 8 (eight) hours.  90 tablet  6  . nitroGLYCERIN (NITROSTAT) 0.4 MG SL tablet Place 1 tablet (0.4 mg total) under the tongue every 5 (five) minutes as needed for chest pain (up to 3 doses).  25 tablet  3  . pantoprazole (PROTONIX) 40 MG tablet Take 1 tablet (40 mg total) by mouth daily at 12 noon.  30 tablet  2  . rosuvastatin (CRESTOR) 5 MG tablet Take 5 mg by mouth every evening.      Marland Kitchen spironolactone (ALDACTONE) 25 MG tablet Take 25 mg by mouth every morning.      . torsemide (DEMADEX) 20 MG tablet Take 80 mg by mouth every morning.       . vitamin B-12 (CYANOCOBALAMIN) 1000 MCG tablet Take 1,000 mcg by mouth daily. Take 1 tablet by mouth daily.        BP 110/70  Pulse 75  Ht 5\' 6"  (1.676 m)  Wt 163 lb 6.4 oz (74.118 kg)  BMI 26.37 kg/m2  SpO2 95% General: NAD Neck: JVP 8 cm, no thyromegaly or thyroid nodule.  Lungs: Slight crackles at bases bilaterally.  CV: Lateral PMI.  Heart regular S1/S2, no [id number], 2/6 systolic murmur LLSB.  No peripheral edema.  No carotid bruit.   Abdomen: Soft, nontender, no hepatosplenomegaly, no distention.  Neurologic: Alert and oriented x 3.  Psych: Normal affect. Extremities: No clubbing or cyanosis.   Assessment/Plan: 1. Chronic systolic CHF: EF [id number] on last echo.  Recent admission with acute/chronic systolic CHF with milrinone and Lasix gtts used.  RHC showed elevated left and right heart filling pressures.  She is doing better symptomatically but still NYHA class III.  She had a hard time taking metolazone but she did lose weight while on it.  - Continue current doses of Coreg, torsemide, and spironolactone.  - Holding off on ACEI for now given CKD and h/o AKI.  - Increase hydralazine to 37.5 mg tid, continue 20 mg tid isordil. - Continue to limit dietary sodium.  - BMET/BNP in 1 week, followup in the office in 1 month.  - Monitor weight: if weight goes up by 2 lbs in 24 hours or 3-4 lbs in a week, she will take a dose of metolazone 30 minutes before torsemide.  2. CAD: Stable with no ischemic symptoms.  Last cath showed occluded RCA and LCx, medical management.  Continue statin, ASA 81.  Lipids good when recently checked.   3. CKD: BMET to be checked in 1 wk.   [name] 06/10/2012 10:09 PM

## 2012-06-18 ENCOUNTER — Telehealth: Payer: Self-pay | Admitting: *Deleted

## 2012-06-18 NOTE — Telephone Encounter (Signed)
Patient informed. 

## 2012-06-18 NOTE — Telephone Encounter (Signed)
Message copied by Merlene Laughter on Mon Jun 18, 2012 10:47 AM ------      Message from: Merlene Laughter      Created: Mon Jun 18, 2012  8:16 AM                   ----- Message -----         From: Larey Dresser, MD         Sent: 06/15/2012   5:03 PM           To: Merlene Laughter, LPN, Laurine Blazer, LPN            Labs look ok

## 2012-06-25 ENCOUNTER — Other Ambulatory Visit (HOSPITAL_COMMUNITY): Payer: Self-pay | Admitting: Cardiology

## 2012-06-29 ENCOUNTER — Telehealth: Payer: Self-pay | Admitting: Physician Assistant

## 2012-06-29 MED ORDER — PANTOPRAZOLE SODIUM 40 MG PO TBEC: 40.0000 mg | DELAYED_RELEASE_TABLET | Freq: Every day | ORAL | Status: DC

## 2012-06-29 NOTE — Telephone Encounter (Signed)
Out of meds for a week [facility], [place] told her that they have been sending request to [place] for a week now and we have not responded to fill it.    Pantoprazole  40mg 

## 2012-07-02 ENCOUNTER — Ambulatory Visit (INDEPENDENT_AMBULATORY_CARE_PROVIDER_SITE_OTHER): Payer: Medicare Other | Admitting: *Deleted

## 2012-07-02 DIAGNOSIS — I5022 Chronic systolic (congestive) heart failure: Secondary | ICD-10-CM

## 2012-07-02 DIAGNOSIS — I2589 Other forms of chronic ischemic heart disease: Secondary | ICD-10-CM

## 2012-07-02 DIAGNOSIS — I255 Ischemic cardiomyopathy: Secondary | ICD-10-CM

## 2012-07-02 DIAGNOSIS — Z95810 Presence of automatic (implantable) cardiac defibrillator: Secondary | ICD-10-CM

## 2012-07-04 LAB — REMOTE ICD DEVICE
AL AMPLITUDE: 2.1 mv
AL IMPEDENCE ICD: 475 Ohm
ATRIAL PACING ICD: 7.09 pct
BRDY-0002LV: 50 {beats}/min
BRDY-0003LV: 130 {beats}/min
CHARGE TIME: 9.989 s
FVT: 0
PACEART VT: 0
RV LEAD AMPLITUDE: 12.8 mv
RV LEAD IMPEDENCE ICD: 437 Ohm
TOT-0001: 0
TOT-0002: 0
TZAT-0001ATACH: 3
TZAT-0001FASTVT: 1
TZAT-0001SLOWVT: 1
TZAT-0001SLOWVT: 2
TZAT-0012ATACH: 150 ms
TZAT-0012ATACH: 150 ms
TZAT-0012FASTVT: 200 ms
TZAT-0012SLOWVT: 200 ms
TZAT-0012SLOWVT: 200 ms
TZAT-0018FASTVT: NEGATIVE
TZAT-0019ATACH: 6 V
TZAT-0019SLOWVT: 8 V
TZAT-0019SLOWVT: 8 V
TZAT-0020ATACH: 1.5 ms
TZAT-0020ATACH: 1.5 ms
TZAT-0020SLOWVT: 1.5 ms
TZAT-0020SLOWVT: 1.5 ms
TZON-0003VSLOWVT: 450 ms
TZST-0001ATACH: 6
TZST-0001FASTVT: 4
TZST-0001FASTVT: 5
TZST-0001FASTVT: 6
TZST-0001SLOWVT: 3
TZST-0001SLOWVT: 5
TZST-0002ATACH: NEGATIVE
TZST-0002FASTVT: NEGATIVE
TZST-0002FASTVT: NEGATIVE
TZST-0002FASTVT: NEGATIVE
TZST-0003SLOWVT: 15 J
TZST-0003SLOWVT: 35 J

## 2012-07-11 ENCOUNTER — Ambulatory Visit (INDEPENDENT_AMBULATORY_CARE_PROVIDER_SITE_OTHER): Payer: Medicare Other | Admitting: Cardiology

## 2012-07-11 ENCOUNTER — Encounter: Payer: Self-pay | Admitting: Cardiology

## 2012-07-11 VITALS — BP 102/57 | HR 74 | Ht 66.0 in | Wt 163.0 lb

## 2012-07-11 DIAGNOSIS — I509 Heart failure, unspecified: Secondary | ICD-10-CM

## 2012-07-11 DIAGNOSIS — I2510 Atherosclerotic heart disease of native coronary artery without angina pectoris: Secondary | ICD-10-CM

## 2012-07-11 DIAGNOSIS — E785 Hyperlipidemia, unspecified: Secondary | ICD-10-CM

## 2012-07-11 DIAGNOSIS — Z79899 Other long term (current) drug therapy: Secondary | ICD-10-CM

## 2012-07-11 DIAGNOSIS — N189 Chronic kidney disease, unspecified: Secondary | ICD-10-CM

## 2012-07-11 DIAGNOSIS — I5022 Chronic systolic (congestive) heart failure: Secondary | ICD-10-CM

## 2012-07-11 MED ORDER — HYDRALAZINE HCL 25 MG PO TABS: 25.0000 mg | ORAL_TABLET | Freq: Three times a day (TID) | ORAL | Status: DC

## 2012-07-11 MED ORDER — CO-ENZYME Q10-VITAMIN E 200-400 MG-UNIT PO WAFR: 200.0000 mg | WAFER | Freq: Every day | ORAL | Status: DC

## 2012-07-11 MED ORDER — ATORVASTATIN CALCIUM 10 MG PO TABS: 10.0000 mg | ORAL_TABLET | Freq: Every evening | ORAL | Status: DC

## 2012-07-11 NOTE — Patient Instructions (Addendum)
   Lab - today for BMET, BNP  Decrease Hydralazine to 25mg  three times per day   Stop Crestor  Change to Atorvastatin 10mg  every evening  Begin Co-enzyme Q10 200mg  daily - OTC  Labs - due in 2 months for cholesterol  Office will contact with results Follow up in  3 months

## 2012-07-12 ENCOUNTER — Encounter: Payer: Self-pay | Admitting: *Deleted

## 2012-07-12 NOTE — Progress Notes (Signed)
Patient ID: [name], female   DOB: Apr 25, 1934, 79 y.o.   MRN: [id number] PCP: Dr. Scotty Court  79 yo with history of diabetes, CAD, and ischemic cardiomyopathy presents for cardiology followup after recent admission for acute on chronic systolic CHF exacerbation.  Patient was admitted in 11/13 with severe volume overload and was started on milrinone and Lasix gtt.  RHC showed significantly elevated left and right heart filling pressures.  She diuresed well and felt better at discharge.  Currently, [name] is stable.  Weight has not changed since last appointment.  She can walk in the house and around [place] without getting short of breath.  She sleeps on 2 pillows but this is habit that has been going on for years.  No chest pain.  Main complaint is occasional lightheadedness with standing.  She has not fallen or passed out.  Home SBP has ranged from 95 to 123.  Finally, Crestor is too expensive.   Labs (11/13): K 4.4, creatinine 1.2 => 1.42, BUN 24, BNP 84, LDL < 70 Labs (12/13): K 4.5, creatinine 1.44, BNP 96  PMH: 1. Depression 2. Type II diabetes with peripheral neuropathy.  3. Hyperlipidemia: GI intolerance to Lipitor. 4. H/o cholecystectomy 5. H/o hysterectomy 6. CKD 7. GERD 8. Ischemic cardiomyopathy: Echo (10/13) mildly dilated LV with EF 35-40%, inferior and inferoseptal severe hypokinesis, grade II diastolic dysfunction, moderate MR, mildly dilated RV, moderate to severe TR, PA systolic pressure 43 mmHg.  Patient has Medtronic CRT-D device.  RHC (11/13) mean RA 19, PA 51/24, mean PCWP 26, CI 2.24 (Fick)/2.73 (thermo).  9. H/o multiple defibrillator shocks after lead fracture.  10. 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  11. CAD: History of PCI to proximal and mid RCA and to proximal LAD.  Patient had NSTEMI in 8/13 with late presentation to hospital.  LHC showed 50% ostial LAD, patent LAD stent, 50% ostial D2, total occlusion of RCA at stent site  (culprit lesion), chronic total occlusion of LCx.  Patient was medically managed given resolution of chest pain and late presentation.   SH: Lives alone in [place].  Nonsmoker.  1 son.    FH: CAD  ROS: All systems reviewed and negative except as per HPI.   Current Outpatient Prescriptions  Medication Sig Dispense Refill  . albuterol (PROVENTIL HFA;VENTOLIN HFA) 108 (90 BASE) MCG/ACT inhaler Inhale 2 puffs into the lungs every 6 (six) hours as needed. For shortness of breath      . ALPRAZolam (XANAX) 0.5 MG tablet Take 0.5 mg by mouth at bedtime as needed. For anxiety/sleep      . aspirin 81 MG tablet Take 81 mg by mouth every evening.       . carvedilol (COREG) 6.25 MG tablet Take 1 tablet (6.25 mg total) by mouth 2 (two) times daily with a meal.  60 tablet  6  . citalopram (CELEXA) 20 MG tablet Take 20 mg by mouth daily.      . clopidogrel (PLAVIX) 75 MG tablet Take 1 tablet (75 mg total) by mouth daily with breakfast.  30 tablet  6  . folic acid (FOLVITE) A999333 MCG tablet Take 400 mcg by mouth daily.      . hydrALAZINE (APRESOLINE) 25 MG tablet Take 1 tablet (25 mg total) by mouth 3 (three) times daily.      Marland Kitchen HYDROcodone-acetaminophen (NORCO) 10-325 MG per tablet Take 1 tablet by mouth every 6 (six) hours as needed.      Marland Kitchen  insulin glargine (LANTUS) 100 UNIT/ML injection Inject 50 Units into the skin 2 (two) times daily.      . isosorbide dinitrate (ISORDIL) 20 MG tablet Take 1 tablet (20 mg total) by mouth every 8 (eight) hours.  90 tablet  6  . nitroGLYCERIN (NITROSTAT) 0.4 MG SL tablet Place 1 tablet (0.4 mg total) under the tongue every 5 (five) minutes as needed for chest pain (up to 3 doses).  25 tablet  3  . pantoprazole (PROTONIX) 40 MG tablet Take 1 tablet (40 mg total) by mouth daily at 12 noon.  30 tablet  2  . spironolactone (ALDACTONE) 25 MG tablet Take 25 mg by mouth every morning.      . torsemide (DEMADEX) 20 MG tablet Take 80 mg by mouth every morning.      . vitamin B-12  (CYANOCOBALAMIN) 1000 MCG tablet Take 1,000 mcg by mouth daily. Take 1 tablet by mouth daily.      Marland Kitchen atorvastatin (LIPITOR) 10 MG tablet Take 1 tablet (10 mg total) by mouth every evening.  30 tablet  6  . Co-Enzyme Q10-Vitamin E 200-400 MG-UNIT WAFR Take 200 mg by mouth daily.        BP 102/57  Pulse 74  Ht 5\' 6"  (1.676 m)  Wt 163 lb (73.936 kg)  BMI 26.31 kg/m2 General: NAD Neck: JVP 7 cm, no thyromegaly or thyroid nodule.  Lungs: CTAB CV: Lateral PMI.  Heart regular S1/S2, no [id number], 2/6 systolic murmur LLSB.  No peripheral edema.  No carotid bruit.   Abdomen: Soft, nontender, no hepatosplenomegaly, no distention.  Neurologic: Alert and oriented x 3.  Psych: Normal affect. Extremities: No clubbing or cyanosis.   Assessment/Plan: 1. Chronic systolic CHF: EF [id number] on last echo.  Recent admission with acute/chronic systolic CHF with milrinone and Lasix gtts used.  RHC showed elevated left and right heart filling pressures.  She is doing better symptomatically but still NYHA class III.   - Continue current doses of Coreg, torsemide, and spironolactone.  - Holding off on ACEI for now given CKD and h/o AKI.  - Given lightheadedness with standing, will decrease hydralazine to 25 mg tid, continue 20 mg tid isordil. - Continue to limit dietary sodium.  - BMET/BNP today.   - Monitor weight: if weight goes up by 2 lbs in 24 hours or 3-4 lbs in a week, she will take a dose of metolazone 30 minutes before torsemide.  2. CAD: Stable with no ischemic symptoms.  Last cath showed occluded RCA and LCx, medical management.  Continue statin, ASA 81.  Lipids good when recently checked.   3. CKD: BMET to be checked today. 4. Hyperlipidemia: Crestor too expensive.  I will transition her to atorvastatin 10 mg daily.  Will need lipids/LFTs in 2 months.   [name] 07/12/2012 12:21 AM

## 2012-07-13 ENCOUNTER — Telehealth: Payer: Self-pay | Admitting: Cardiology

## 2012-07-13 NOTE — Telephone Encounter (Signed)
Ms. Joaquim Lai called requesting to verify that Mrs. Gupta was seen in our office by Dr. Aundra Dubin. I verified this for her. I also verified the fax number to the [place] office and to the [place] office for Dr. Aundra Dubin.

## 2012-08-01 ENCOUNTER — Telehealth: Payer: Self-pay | Admitting: *Deleted

## 2012-08-01 NOTE — Telephone Encounter (Signed)
Patient called stating that she could not tolerate the Atorvastatin or the Co-Q10.  Just started 2 days ago because she was finishing up her current supply of the Crestor.  Caused her joints to ache, nausea, irritated her neuropathy, and affected vision.  States her sugar has been running good around 103.  States she is willing to try something else.  Maybe something a little cheaper if possible.

## 2012-08-01 NOTE — Telephone Encounter (Signed)
Could she go back on the Crestor? Other statins are not as effective.  Can we get samples for her so it is not as expensive?

## 2012-08-02 NOTE — Telephone Encounter (Signed)
Yes, resume her prior dosing.

## 2012-08-02 NOTE — Telephone Encounter (Signed)
The only samples that we keep in this office is the antiplatelet & anticoagulant medications.  Her PMD does not carry Crestor samples either.  Discussed below with patient.  Informed her that I did find a savings card and another coupon for a free 30 day supply of Crestor.  Aeronautical engineer)  Will send her scripts & coupon in the mail at her request.  Should she resume previous dose of 5mg  every evening?  If this does not work, we will see what other assistance [place] may offer for her.

## 2012-08-03 ENCOUNTER — Other Ambulatory Visit: Payer: Self-pay | Admitting: *Deleted

## 2012-08-03 ENCOUNTER — Encounter: Payer: Self-pay | Admitting: *Deleted

## 2012-08-03 MED ORDER — ROSUVASTATIN CALCIUM 5 MG PO TABS: 5.0000 mg | ORAL_TABLET | Freq: Every evening | ORAL | Status: DC

## 2012-08-03 MED ORDER — ROSUVASTATIN CALCIUM 5 MG PO TABS: 5.0000 mg | ORAL_TABLET | Freq: Every day | ORAL | Status: DC

## 2012-08-03 NOTE — Telephone Encounter (Signed)
Patient aware - will put scripts in mail.

## 2012-09-17 ENCOUNTER — Encounter: Payer: Self-pay | Admitting: *Deleted

## 2012-09-25 ENCOUNTER — Other Ambulatory Visit (HOSPITAL_COMMUNITY): Payer: Self-pay | Admitting: Cardiology

## 2012-10-01 ENCOUNTER — Ambulatory Visit (INDEPENDENT_AMBULATORY_CARE_PROVIDER_SITE_OTHER): Payer: Medicare Other | Admitting: *Deleted

## 2012-10-01 ENCOUNTER — Other Ambulatory Visit: Payer: Self-pay | Admitting: Internal Medicine

## 2012-10-01 ENCOUNTER — Encounter: Payer: Self-pay | Admitting: Internal Medicine

## 2012-10-01 DIAGNOSIS — I255 Ischemic cardiomyopathy: Secondary | ICD-10-CM

## 2012-10-01 DIAGNOSIS — I2589 Other forms of chronic ischemic heart disease: Secondary | ICD-10-CM

## 2012-10-01 DIAGNOSIS — Z95810 Presence of automatic (implantable) cardiac defibrillator: Secondary | ICD-10-CM

## 2012-10-01 DIAGNOSIS — I5022 Chronic systolic (congestive) heart failure: Secondary | ICD-10-CM

## 2012-10-03 ENCOUNTER — Other Ambulatory Visit: Payer: Self-pay | Admitting: Cardiology

## 2012-10-03 MED ORDER — ROSUVASTATIN CALCIUM 5 MG PO TABS: 5.0000 mg | ORAL_TABLET | Freq: Every evening | ORAL | Status: DC

## 2012-10-03 NOTE — Telephone Encounter (Signed)
Crestor qty of 30 refill 1

## 2012-10-09 LAB — REMOTE ICD DEVICE
AL IMPEDENCE ICD: 494 Ohm
BAMS-0001: 170 {beats}/min
BRDY-0002LV: 50 {beats}/min
BRDY-0003LV: 130 {beats}/min
BRDY-0004LV: 120 {beats}/min
CHARGE TIME: 10.35 s
FVT: 0
LV LEAD THRESHOLD: 2 V
PACEART VT: 0
RV LEAD AMPLITUDE: 17.4 mv
TOT-0001: 0
TZAT-0001ATACH: 1
TZAT-0001ATACH: 3
TZAT-0001FASTVT: 1
TZAT-0002ATACH: NEGATIVE
TZAT-0002FASTVT: NEGATIVE
TZAT-0004SLOWVT: 8
TZAT-0004SLOWVT: 8
TZAT-0012ATACH: 150 ms
TZAT-0012ATACH: 150 ms
TZAT-0012SLOWVT: 200 ms
TZAT-0012SLOWVT: 200 ms
TZAT-0013SLOWVT: 2
TZAT-0013SLOWVT: 2
TZAT-0018ATACH: NEGATIVE
TZAT-0018SLOWVT: NEGATIVE
TZAT-0019ATACH: 6 V
TZAT-0019SLOWVT: 8 V
TZAT-0020ATACH: 1.5 ms
TZAT-0020SLOWVT: 1.5 ms
TZAT-0020SLOWVT: 1.5 ms
TZON-0003ATACH: 350 ms
TZON-0003SLOWVT: 340 ms
TZON-0003VSLOWVT: 450 ms
TZON-0004SLOWVT: 28
TZON-0005SLOWVT: 12
TZST-0001ATACH: 4
TZST-0001ATACH: 6
TZST-0001FASTVT: 2
TZST-0001FASTVT: 4
TZST-0001SLOWVT: 5
TZST-0001SLOWVT: 6
TZST-0002ATACH: NEGATIVE
TZST-0002ATACH: NEGATIVE
TZST-0002FASTVT: NEGATIVE
TZST-0002FASTVT: NEGATIVE
TZST-0003SLOWVT: 25 J
TZST-0003SLOWVT: 35 J
VENTRICULAR PACING ICD: 99.95 pct

## 2012-10-19 ENCOUNTER — Ambulatory Visit: Payer: Medicare Other | Admitting: Cardiology

## 2012-10-22 ENCOUNTER — Telehealth: Payer: Self-pay | Admitting: Physician Assistant

## 2012-10-22 NOTE — Telephone Encounter (Signed)
Patient called and states she is unable to take pantoprazole (PROTONIX) 40 MG tablet-states makes her feel sick on her stomach.  Please call patient and advise, she has been without medication for stomach.

## 2012-10-23 NOTE — Telephone Encounter (Signed)
Advised patient to try OTC Zantac 75mg  daily as this med will be okay with Plavix.  Can discuss further at [place] 5/14 with Dr. Aundra Dubin.  Patient verbalized understanding.

## 2012-11-07 ENCOUNTER — Ambulatory Visit (INDEPENDENT_AMBULATORY_CARE_PROVIDER_SITE_OTHER): Payer: Medicare Other | Admitting: Cardiology

## 2012-11-07 ENCOUNTER — Encounter: Payer: Self-pay | Admitting: Cardiology

## 2012-11-07 VITALS — BP 101/60 | HR 65 | Ht 66.0 in | Wt 154.4 lb

## 2012-11-07 DIAGNOSIS — I2510 Atherosclerotic heart disease of native coronary artery without angina pectoris: Secondary | ICD-10-CM

## 2012-11-07 DIAGNOSIS — I5022 Chronic systolic (congestive) heart failure: Secondary | ICD-10-CM

## 2012-11-07 DIAGNOSIS — R0602 Shortness of breath: Secondary | ICD-10-CM

## 2012-11-07 DIAGNOSIS — I739 Peripheral vascular disease, unspecified: Secondary | ICD-10-CM

## 2012-11-07 DIAGNOSIS — N189 Chronic kidney disease, unspecified: Secondary | ICD-10-CM

## 2012-11-07 MED ORDER — RANITIDINE HCL 150 MG PO TABS: 150.0000 mg | ORAL_TABLET | Freq: Two times a day (BID) | ORAL | Status: DC

## 2012-11-07 NOTE — Progress Notes (Signed)
Patient ID: [name], female   DOB: 01-14-1934, 79 y.o.   MRN: [id number] PCP: Dr. Scotty Court  79 yo with history of diabetes, CAD, and ischemic cardiomyopathy presents for cardiology followup after admission for acute on chronic systolic CHF exacerbation in 11/13.  Patient was admitted with severe volume overload and was started on milrinone and Lasix gtt.  RHC showed significantly elevated left and right heart filling pressures.  She diuresed well and felt better at discharge.  [name] is not feeling good today.  She has a poor appetite.  She has had trouble with epigastric discomfort (cannot take Protonix).  She has been having neuropathy pain in her feet.  She also has poorly differentiated pain in her legs bilaterally. No edema, chest pain, or orthopnea/PND.  She feels like she is more short of breath (after walking 50-60 feet).  Since last appointment, however, she has lost 9 lbs.   I checked Optivol on her pacemaker today.  Her thoracic impedance has been trending up and she is well below threshold for fluid index.  She is BiV pacing 99+% of the time.   Labs (11/13): K 4.4, creatinine 1.2 => 1.42, BUN 24, BNP 84, LDL < 70 Labs (12/13): K 4.5, creatinine 1.44, BNP 96 Labs (3/14): LDL 30, HDL 28  PMH: 1. Depression 2. Type II diabetes with peripheral neuropathy.  3. Hyperlipidemia: GI intolerance to Lipitor. 4. H/o cholecystectomy 5. H/o hysterectomy 6. CKD 7. GERD 8. Ischemic cardiomyopathy: Echo (10/13) mildly dilated LV with EF 35-40%, inferior and inferoseptal severe hypokinesis, grade II diastolic dysfunction, moderate MR, mildly dilated RV, moderate to severe TR, PA systolic pressure 43 mmHg.  Patient has Medtronic CRT-D device.  RHC (11/13) mean RA 19, PA 51/24, mean PCWP 26, CI 2.24 (Fick)/2.73 (thermo).  9. H/o multiple defibrillator shocks after lead fracture.  10. 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  11. CAD: History of PCI to proximal and  mid RCA and to proximal LAD.  Patient had NSTEMI in 8/13 with late presentation to hospital.  LHC showed 50% ostial LAD, patent LAD stent, 50% ostial D2, total occlusion of RCA at stent site (culprit lesion), chronic total occlusion of LCx.  Patient was medically managed given resolution of chest pain and late presentation.   SH: Lives alone in [place].  Quit smoking a number of years ago.  1 son.    FH: CAD  ROS: All systems reviewed and negative except as per HPI.   Current Outpatient Prescriptions  Medication Sig Dispense Refill  . ALPRAZolam (XANAX) 0.5 MG tablet Take 0.5 mg by mouth at bedtime as needed. For anxiety/sleep      . aspirin 81 MG tablet Take 81 mg by mouth every evening.       . carvedilol (COREG) 6.25 MG tablet Take 1 tablet (6.25 mg total) by mouth 2 (two) times daily with a meal.  60 tablet  6  . citalopram (CELEXA) 20 MG tablet Take 20 mg by mouth daily.      . clopidogrel (PLAVIX) 75 MG tablet TAKE ONE TABLET BY MOUTH EVERY DAY WITH BREAKFAST  30 tablet  5  . hydrALAZINE (APRESOLINE) 25 MG tablet Take 1 tablet (25 mg total) by mouth 3 (three) times daily.      Marland Kitchen HYDROcodone-acetaminophen (NORCO) 10-325 MG per tablet Take 1 tablet by mouth every 6 (six) hours as needed.      . insulin glargine (LANTUS) 100 UNIT/ML injection Inject 50 Units  into the skin 2 (two) times daily.      . isosorbide dinitrate (ISORDIL) 20 MG tablet Take 1 tablet (20 mg total) by mouth every 8 (eight) hours.  90 tablet  6  . nitroGLYCERIN (NITROSTAT) 0.4 MG SL tablet Place 1 tablet (0.4 mg total) under the tongue every 5 (five) minutes as needed for chest pain (up to 3 doses).  25 tablet  3  . OXYGEN-HELIUM IN Inhale into the lungs. As needed      . pantoprazole (PROTONIX) 40 MG tablet Take 1 tablet (40 mg total) by mouth daily at 12 noon.  30 tablet  2  . rosuvastatin (CRESTOR) 5 MG tablet Take 1 tablet (5 mg total) by mouth every evening.  30 tablet  1  . spironolactone (ALDACTONE) 25 MG tablet  Take 25 mg by mouth every morning.      . torsemide (DEMADEX) 20 MG tablet Take 80 mg by mouth every morning.      . ranitidine (ZANTAC) 150 MG tablet Take 1 tablet (150 mg total) by mouth 2 (two) times daily.  60 tablet  6   No current facility-administered medications for this visit.    BP 101/60  Pulse 65  Ht 5\' 6"  (1.676 m)  Wt 154 lb 6.4 oz (70.035 kg)  BMI 24.93 kg/m2  SpO2 95% General: NAD Neck: JVP 7 cm, no thyromegaly or thyroid nodule.  Lungs: Slightly decreased breath sounds throughout.  CV: Lateral PMI.  Heart regular S1/S2, no [id number], 1/6 systolic murmur LLSB.  No peripheral edema.  No carotid bruit.  I cannot feel her PT pulses.  Abdomen: Soft, nontender, no hepatosplenomegaly, no distention.  Neurologic: Alert and oriented x 3.  Psych: Normal affect. Extremities: No clubbing or cyanosis.   Assessment/Plan: 1. Chronic systolic CHF: EF [id number] on last echo.  Admission in 11/13 with acute/chronic systolic CHF with milrinone and Lasix gtts used.  RHC showed elevated left and right heart filling pressures.  She is subjectively more short of breath but is not volume overloaded on exam and weight is down 9 lbs.  Optivol check confirms that her fluid level is probably not elevated.  - Continue current doses of Coreg, torsemide, spironolactone, and hydralazine/isordil.  - Holding off on ACEI for now given CKD and h/o AKI.  - Continue to limit dietary sodium.  - BMET/BNP today.   - I wonder if her dyspnea is lung-related.  She used to be a smoker and could have COPD.  I will arrange for PFTs.   2. CAD: Stable with no ischemic symptoms.  Last cath showed occluded RCA and LCx, medical management.  Continue statin, ASA 81.  Lipids good when recently checked.   3. CKD: BMET to be checked today. 4. Calf pain: Poorly differentiated but I cannot feel her PT pulses.  I will arrange for peripheral arterial doppler evaluation.  5. GERD symptoms: She will try ranitidine since she cannot take  Protonix.    [name] 11/07/2012 10:47 PM

## 2012-11-07 NOTE — Patient Instructions (Signed)
   PFT (Pulmonary Function Test)  Labs for BMET, BNP  Peripheral arterial dopplers (lower extremity)  Office will contact with results  Begin Ranitidine 150mg  twice a day   Continue all other current medications. Follow up in  1 month

## 2012-11-08 ENCOUNTER — Encounter (INDEPENDENT_AMBULATORY_CARE_PROVIDER_SITE_OTHER): Payer: Medicare Other

## 2012-11-08 DIAGNOSIS — I739 Peripheral vascular disease, unspecified: Secondary | ICD-10-CM

## 2012-11-08 LAB — PULMONARY FUNCTION TEST

## 2012-11-09 ENCOUNTER — Encounter: Payer: Self-pay | Admitting: *Deleted

## 2012-11-13 ENCOUNTER — Telehealth: Payer: Self-pay | Admitting: *Deleted

## 2012-11-13 NOTE — Telephone Encounter (Signed)
Left message to return call 

## 2012-11-13 NOTE — Telephone Encounter (Signed)
She was not volume overloaded at last appointment.  Decrease torsemide to 40 mg daily and followup in 1 week with Gene or me.  Symptoms sound like they could be due to a UTI.  Needs evaluation by PCP.

## 2012-11-13 NOTE — Telephone Encounter (Signed)
Patient called with complaints of pain in both her legs and lower back.  Patient thinks this may be related to recent medication changes.  States she did hold Torsemide on Saturday and Sunday.  Did take the 40mg  on Monday, but none today.  States that she feels especially crampy in her lower back.  Further questioning r/t urinary symptoms - states she does not have burning with urination, but does have an uncomfortable feeling & feels like she is not emptying all the way.  Also, stated that she felt a little confused, like she had to really concentrate on things.  States she is not going to take anymore Torsemide to hearing back from [place].

## 2012-11-14 NOTE — Telephone Encounter (Signed)
Patient notified.  Advised to see PMD regarding urinary type symptoms.  Will call patient this afternoon with appointment after discussing with provider.  Patient verbalized understanding.

## 2012-11-22 ENCOUNTER — Telehealth: Payer: Self-pay | Admitting: *Deleted

## 2012-11-22 NOTE — Telephone Encounter (Signed)
Patient notified

## 2012-11-22 NOTE — Telephone Encounter (Signed)
Message copied by Laurine Blazer on Thu Nov 22, 2012 11:29 AM ------      Message from: Larey Dresser      Created: Fri Nov 16, 2012 11:18 AM       Normal ------

## 2012-11-22 NOTE — Telephone Encounter (Addendum)
Saw PMD on 5/22 - did have bladder infection & was treated for this.  Still continue to c/o fluid, leg pain.  Scheduled OV with Gene for 6/2 at 1:20.  If symptoms worsen, ED for evaluation.

## 2012-11-23 ENCOUNTER — Ambulatory Visit (INDEPENDENT_AMBULATORY_CARE_PROVIDER_SITE_OTHER): Payer: Medicare Other | Admitting: Cardiology

## 2012-11-23 ENCOUNTER — Encounter: Payer: Self-pay | Admitting: Cardiology

## 2012-11-23 VITALS — BP 117/70 | HR 69 | Wt 165.0 lb

## 2012-11-23 DIAGNOSIS — I5023 Acute on chronic systolic (congestive) heart failure: Secondary | ICD-10-CM

## 2012-11-23 NOTE — Progress Notes (Signed)
HPI The patient is a lovely 79 year old white female with a history of coronary artery disease and ischemic cardiomyopathy. He was last hospitalized in November and required milrinone therapy and Lasix IV for management of this. She did have right heart catheterization at that time demonstrating elevated filling pressures. She has been followed by Dr. Aundra Dubin.  He saw her earlier this month. She was having increasing pain in her feet which he did not understand. However, he did not think she was volume overloaded.  In fact her weight was down. Unfortunately her BUN on followup labs was up to 70 with a creatinine of 2.23. (Previous 29 and 1.37). She was told not to take her Zaroxolyn. Told to hold her torsemide for 2 days and reduce the dose to 40 mg daily. Make sure she is not taking metolazone. Have her hold torsemide for 2 days then decrease dose to 40 mg daily. She presents today for followup.  She has not been doing well. She is fatigued. She's having increasing dyspnea with mild exertion. She's not had a 9 pound weight gain. She's having some swelling in her legs she says she's not had before. She still has leg aching and she's not describing any chest pressure, neck or arm discomfort. She's not noticing any palpitations, presyncope or syncope. She is weighing herself and watching her salt.   Allergies  Allergen Reactions  . Metformin     Upset stomach  . Sulfonamide Derivatives     swelling    Current Outpatient Prescriptions  Medication Sig Dispense Refill  . ALPRAZolam (XANAX) 0.5 MG tablet Take 0.5 mg by mouth at bedtime as needed. For anxiety/sleep      . aspirin 81 MG tablet Take 81 mg by mouth every evening.       . carvedilol (COREG) 6.25 MG tablet Take 1 tablet (6.25 mg total) by mouth 2 (two) times daily with a meal.  60 tablet  6  . citalopram (CELEXA) 20 MG tablet Take 20 mg by mouth daily.      . clopidogrel (PLAVIX) 75 MG tablet TAKE ONE TABLET BY MOUTH EVERY DAY WITH  BREAKFAST  30 tablet  5  . hydrALAZINE (APRESOLINE) 25 MG tablet Take 1 tablet (25 mg total) by mouth 3 (three) times daily.      Marland Kitchen HYDROcodone-acetaminophen (NORCO) 10-325 MG per tablet Take 1 tablet by mouth every 6 (six) hours as needed.      . insulin glargine (LANTUS) 100 UNIT/ML injection Inject 50 Units into the skin 2 (two) times daily.      . isosorbide dinitrate (ISORDIL) 20 MG tablet Take 1 tablet (20 mg total) by mouth every 8 (eight) hours.  90 tablet  6  . nitroGLYCERIN (NITROSTAT) 0.4 MG SL tablet Place 1 tablet (0.4 mg total) under the tongue every 5 (five) minutes as needed for chest pain (up to 3 doses).  25 tablet  3  . OXYGEN-HELIUM IN Inhale into the lungs. As needed      . ranitidine (ZANTAC) 150 MG tablet Take 1 tablet (150 mg total) by mouth 2 (two) times daily.  60 tablet  6  . rosuvastatin (CRESTOR) 5 MG tablet Take 1 tablet (5 mg total) by mouth every evening.  30 tablet  1  . spironolactone (ALDACTONE) 25 MG tablet Take 25 mg by mouth every morning.      . torsemide (DEMADEX) 20 MG tablet Take 80 mg by mouth every morning.       No  current facility-administered medications for this visit.    Past Medical History  Diagnosis Date  . Type 2 diabetes mellitus   . Anxiety disorder   . GERD (gastroesophageal reflux disease)   . Hyperlipidemia     H/o GI intolerance to Lipitor  . Ischemic cardiomyopathy     s/p Medtronic BiV ICD 2006  . Systolic and diastolic CHF, chronic     echo 03/2012 EF 123456, mod diastolic dysfunction, mod MR, mod LAE, mod-severe TR, PASP 76mmHg  . Impingement syndrome of left shoulder   . Aneurysm     Apical; previously on Coumadin  . Chronic renal insufficiency     Creatinine 1.17 January 2010  . Peripheral polyneuropathy     Refractory to several medications  . Coronary artery disease     Cath 02/15/12 revealed RCA as culprit lesion w/ occlusion at site of prior stent, patent LAD stent, chronic LCx and Diagonal dz; Medical therapy   .  Depression     Past Surgical History  Procedure Laterality Date  . Abdominal hysterectomy    . Cardiac defibrillator placement  05/28/2009    Medtronic ICD placed by Dr. Caryl Comes secondary to ICM/CHF, [id number] Fidelis lead fracture with ICD storm 2010 requiring new RV lead placement  . Insert / replace / remove pacemaker    . Cholecystectomy    . Eye surgery      Family History  Problem Relation Age of Onset  . Coronary artery disease Neg Hx     History   Social History  . Marital Status: Married    Spouse Name: N/A    Number of Children: N/A  . Years of Education: N/A   Occupational History  . Retired    Social History Main Topics  . Smoking status: Former Smoker -- 0.80 packs/day for 30 years    Types: Cigarettes    Quit date: 06/27/2002  . Smokeless tobacco: Never Used  . Alcohol Use: No  . Drug Use: Not on file  . Sexually Active: Not Currently   Other Topics Concern  . Not on file   Social History Narrative  . No narrative on file    ROS:  As stated in the HPI and negative for all other systems.   PHYSICAL EXAM BP 117/70  Pulse 69  Wt 165 lb (74.844 kg)  BMI 26.64 kg/m2 GENERAL:  Well appearing HEENT:  Pupils equal round and reactive, fundi not visualized, oral mucosa unremarkable NECK:  No jugular venous distention, waveform within normal limits, carotid upstroke brisk and symmetric, no bruits, no thyromegaly LYMPHATICS:  No cervical, inguinal adenopathy LUNGS:  Clear to auscultation bilaterally BACK:  No CVA tenderness CHEST:  Unremarkable, , pacemaker pocket intact HEART:  PMI not displaced or sustained,S1 and S2 within normal limits, no S3, no S4, no clicks, no rubs, no murmurs ABD:  Flat, positive bowel sounds normal in frequency in pitch, no bruits, no rebound, no guarding, no midline pulsatile mass, no hepatomegaly, no splenomegaly EXT:  2 plus pulses throughout, no edema, no cyanosis no clubbing SKIN:  No rashes no nodules NEURO:  Cranial nerves II  through XII grossly intact, motor grossly intact throughout PSYCH:  Cognitively intact, oriented to person place and time   EKG:  Pending   ASSESSMENT AND PLAN  Chronic systolic CHF: EF [id number] on last echo. Admission in 11/13 with acute/chronic systolic CHF with milrinone and Lasix gtts used. RHC showed elevated left and right heart filling pressures. Unfortunately since last being seen  she's having worsening symptoms now with a 9 pound weight gain. I do not think she will respond well to outpatient therapy. I have suggested inpatient treatment with repeat milrinone therapy. She consents to do this but would like to wait until tomorrow morning. I have spoken with our team to arrange this. The orders will need to be written in the morning. Of note with renal insufficiency she's not on an ACE inhibitor.She is subjectively more short of breath but is not volume overloaded on exam and weight is down 9 lbs. Optivol check confirms that her fluid level is probably not elevated.   CAD: Stable with no ischemic symptoms. Last cath showed occluded RCA and LCx, medical management. Continue statin, ASA 81. No further imaging is indicated.   CKD: we will follow this closely during her hospitalization.  Calf pain: This was evaluated recently by Dr. Aundra Dubin and she had normal ABIs.  GERD symptoms: She will try ranitidine since she cannot take Protonix.

## 2012-11-23 NOTE — Patient Instructions (Addendum)
   Elective admission tomorrow to [facility] for IV Milrinone, CHF - tele bed.    Trish notified.   11/23/2012 - 3:57.   ---agh   Dr. Acie Fredrickson on for tomorrow.

## 2012-11-24 ENCOUNTER — Inpatient Hospital Stay (HOSPITAL_COMMUNITY): Payer: Medicare Other

## 2012-11-24 ENCOUNTER — Inpatient Hospital Stay (HOSPITAL_COMMUNITY)
Admission: AD | Admit: 2012-11-24 | Discharge: 2012-11-30 | DRG: 293 | Disposition: A | Discharge status: Home Health | Payer: Medicare Other | Source: Ambulatory Visit | Attending: Cardiology | Admitting: Cardiology

## 2012-11-24 ENCOUNTER — Encounter (HOSPITAL_COMMUNITY): Payer: Self-pay | Admitting: *Deleted

## 2012-11-24 DIAGNOSIS — I509 Heart failure, unspecified: Secondary | ICD-10-CM | POA: Diagnosis present

## 2012-11-24 DIAGNOSIS — F411 Generalized anxiety disorder: Secondary | ICD-10-CM | POA: Diagnosis present

## 2012-11-24 DIAGNOSIS — E119 Type 2 diabetes mellitus without complications: Secondary | ICD-10-CM | POA: Diagnosis present

## 2012-11-24 DIAGNOSIS — Z66 Do not resuscitate: Secondary | ICD-10-CM | POA: Diagnosis present

## 2012-11-24 DIAGNOSIS — N179 Acute kidney failure, unspecified: Secondary | ICD-10-CM

## 2012-11-24 DIAGNOSIS — K219 Gastro-esophageal reflux disease without esophagitis: Secondary | ICD-10-CM | POA: Diagnosis present

## 2012-11-24 DIAGNOSIS — I2510 Atherosclerotic heart disease of native coronary artery without angina pectoris: Secondary | ICD-10-CM

## 2012-11-24 DIAGNOSIS — E1142 Type 2 diabetes mellitus with diabetic polyneuropathy: Secondary | ICD-10-CM | POA: Diagnosis present

## 2012-11-24 DIAGNOSIS — F3289 Other specified depressive episodes: Secondary | ICD-10-CM | POA: Diagnosis present

## 2012-11-24 DIAGNOSIS — Z87891 Personal history of nicotine dependence: Secondary | ICD-10-CM

## 2012-11-24 DIAGNOSIS — I255 Ischemic cardiomyopathy: Secondary | ICD-10-CM | POA: Diagnosis present

## 2012-11-24 DIAGNOSIS — Z794 Long term (current) use of insulin: Secondary | ICD-10-CM

## 2012-11-24 DIAGNOSIS — I5023 Acute on chronic systolic (congestive) heart failure: Principal | ICD-10-CM

## 2012-11-24 DIAGNOSIS — E1149 Type 2 diabetes mellitus with other diabetic neurological complication: Secondary | ICD-10-CM | POA: Diagnosis present

## 2012-11-24 DIAGNOSIS — E785 Hyperlipidemia, unspecified: Secondary | ICD-10-CM | POA: Diagnosis present

## 2012-11-24 DIAGNOSIS — Z95810 Presence of automatic (implantable) cardiac defibrillator: Secondary | ICD-10-CM

## 2012-11-24 DIAGNOSIS — Z8249 Family history of ischemic heart disease and other diseases of the circulatory system: Secondary | ICD-10-CM

## 2012-11-24 DIAGNOSIS — N182 Chronic kidney disease, stage 2 (mild): Secondary | ICD-10-CM | POA: Diagnosis present

## 2012-11-24 DIAGNOSIS — N189 Chronic kidney disease, unspecified: Secondary | ICD-10-CM

## 2012-11-24 DIAGNOSIS — I2589 Other forms of chronic ischemic heart disease: Secondary | ICD-10-CM

## 2012-11-24 DIAGNOSIS — F329 Major depressive disorder, single episode, unspecified: Secondary | ICD-10-CM | POA: Diagnosis present

## 2012-11-24 LAB — BASIC METABOLIC PANEL
BUN: 23 mg/dL (ref 6–23)
Chloride: 97 mEq/L (ref 96–112)
GFR calc Af Amer: 38 mL/min — ABNORMAL LOW (ref >90)
GFR calc non Af Amer: 33 mL/min — ABNORMAL LOW (ref >90)
Potassium: 4.2 mEq/L (ref 3.5–5.1)
Sodium: 140 mEq/L (ref 135–145)

## 2012-11-24 LAB — CBC
HCT: 35 % — ABNORMAL LOW (ref 36.0–46.0)
Hemoglobin: 11.8 g/dL — ABNORMAL LOW (ref 12.0–15.0)
RBC: 3.97 MIL/uL (ref 3.87–5.11)
WBC: 5.2 10*3/uL (ref 4.0–10.5)

## 2012-11-24 LAB — PRO B NATRIURETIC PEPTIDE: Pro B Natriuretic peptide (BNP): 1594 pg/mL — ABNORMAL HIGH (ref 0–450)

## 2012-11-24 LAB — GLUCOSE, CAPILLARY: Glucose-Capillary: 211 mg/dL — ABNORMAL HIGH (ref 70–99)

## 2012-11-24 MED ORDER — ALPRAZOLAM 0.5 MG PO TABS
0.5000 mg | ORAL_TABLET | Freq: Every evening | ORAL | Status: DC | PRN
  Administered 2012-11-25 – 2012-11-28 (×5): 0.5 mg via ORAL
  Filled 2012-11-24 (×6): qty 1

## 2012-11-24 MED ORDER — SODIUM CHLORIDE 0.9 % IJ SOLN: 3.0000 mL | INTRAMUSCULAR | Status: DC | PRN

## 2012-11-24 MED ORDER — CARVEDILOL 6.25 MG PO TABS
6.2500 mg | ORAL_TABLET | Freq: Two times a day (BID) | ORAL | Status: DC
  Administered 2012-11-24 – 2012-11-29 (×11): 6.25 mg via ORAL
  Filled 2012-11-24 (×14): qty 1

## 2012-11-24 MED ORDER — HYDROCODONE-ACETAMINOPHEN 10-325 MG PO TABS
1.0000 | ORAL_TABLET | Freq: Four times a day (QID) | ORAL | Status: DC | PRN
  Administered 2012-11-24 – 2012-11-30 (×18): 1 via ORAL
  Filled 2012-11-24 (×19): qty 1

## 2012-11-24 MED ORDER — CITALOPRAM HYDROBROMIDE 20 MG PO TABS
20.0000 mg | ORAL_TABLET | Freq: Every day | ORAL | Status: DC
  Administered 2012-11-25 – 2012-11-30 (×6): 20 mg via ORAL
  Filled 2012-11-24 (×6): qty 1

## 2012-11-24 MED ORDER — ASPIRIN 81 MG PO CHEW
81.0000 mg | CHEWABLE_TABLET | Freq: Every evening | ORAL | Status: DC
  Administered 2012-11-24 – 2012-11-29 (×6): 81 mg via ORAL
  Filled 2012-11-24 (×12): qty 1

## 2012-11-24 MED ORDER — CLOPIDOGREL BISULFATE 75 MG PO TABS
75.0000 mg | ORAL_TABLET | Freq: Every day | ORAL | Status: DC
  Administered 2012-11-25 – 2012-11-30 (×6): 75 mg via ORAL
  Filled 2012-11-24 (×6): qty 1

## 2012-11-24 MED ORDER — SODIUM CHLORIDE 0.9 % IV SOLN
250.0000 mL | INTRAVENOUS | Status: DC | PRN
  Administered 2012-11-25: 250 mL via INTRAVENOUS

## 2012-11-24 MED ORDER — ISOSORBIDE DINITRATE 20 MG PO TABS
20.0000 mg | ORAL_TABLET | Freq: Three times a day (TID) | ORAL | Status: DC
  Administered 2012-11-24 – 2012-11-29 (×15): 20 mg via ORAL
  Filled 2012-11-24 (×21): qty 1

## 2012-11-24 MED ORDER — SODIUM CHLORIDE 0.9 % IJ SOLN
3.0000 mL | Freq: Two times a day (BID) | INTRAMUSCULAR | Status: DC
  Administered 2012-11-24 – 2012-11-29 (×6): 3 mL via INTRAVENOUS

## 2012-11-24 MED ORDER — HYDRALAZINE HCL 25 MG PO TABS
25.0000 mg | ORAL_TABLET | Freq: Three times a day (TID) | ORAL | Status: DC
  Administered 2012-11-24 – 2012-11-29 (×13): 25 mg via ORAL
  Filled 2012-11-24 (×21): qty 1

## 2012-11-24 MED ORDER — ACETAMINOPHEN 325 MG PO TABS: 650.0000 mg | ORAL_TABLET | ORAL | Status: DC | PRN

## 2012-11-24 MED ORDER — INSULIN GLARGINE 100 UNIT/ML ~~LOC~~ SOLN
50.0000 [IU] | Freq: Two times a day (BID) | SUBCUTANEOUS | Status: DC
  Administered 2012-11-24: 46 [IU] via SUBCUTANEOUS
  Administered 2012-11-25 – 2012-11-28 (×8): 50 [IU] via SUBCUTANEOUS
  Filled 2012-11-24 (×11): qty 0.5

## 2012-11-24 MED ORDER — NITROGLYCERIN 0.4 MG SL SUBL: 0.4000 mg | SUBLINGUAL_TABLET | SUBLINGUAL | Status: DC | PRN

## 2012-11-24 MED ORDER — ONDANSETRON HCL 4 MG/2ML IJ SOLN: 4.0000 mg | Freq: Four times a day (QID) | INTRAMUSCULAR | Status: DC | PRN

## 2012-11-24 MED ORDER — SPIRONOLACTONE 25 MG PO TABS
25.0000 mg | ORAL_TABLET | Freq: Every morning | ORAL | Status: DC
  Administered 2012-11-25 – 2012-11-30 (×6): 25 mg via ORAL
  Filled 2012-11-24 (×6): qty 1

## 2012-11-24 MED ORDER — INSULIN ASPART 100 UNIT/ML ~~LOC~~ SOLN
0.0000 [IU] | Freq: Three times a day (TID) | SUBCUTANEOUS | Status: DC
  Administered 2012-11-25 (×2): 5 [IU] via SUBCUTANEOUS
  Administered 2012-11-25 – 2012-11-26 (×2): 2 [IU] via SUBCUTANEOUS
  Administered 2012-11-26: 3 [IU] via SUBCUTANEOUS
  Administered 2012-11-26 – 2012-11-27 (×3): 5 [IU] via SUBCUTANEOUS
  Administered 2012-11-27: 7 [IU] via SUBCUTANEOUS
  Administered 2012-11-28 (×2): 5 [IU] via SUBCUTANEOUS
  Administered 2012-11-28 – 2012-11-29 (×2): 3 [IU] via SUBCUTANEOUS
  Administered 2012-11-29: 2 [IU] via SUBCUTANEOUS
  Administered 2012-11-29: 3 [IU] via SUBCUTANEOUS
  Administered 2012-11-30: 1 [IU] via SUBCUTANEOUS

## 2012-11-24 MED ORDER — ATORVASTATIN CALCIUM 10 MG PO TABS
10.0000 mg | ORAL_TABLET | Freq: Every day | ORAL | Status: DC
  Administered 2012-11-24 – 2012-11-29 (×5): 10 mg via ORAL
  Filled 2012-11-24 (×7): qty 1

## 2012-11-24 MED ORDER — FAMOTIDINE 20 MG PO TABS
20.0000 mg | ORAL_TABLET | Freq: Two times a day (BID) | ORAL | Status: DC
  Administered 2012-11-24 – 2012-11-29 (×9): 20 mg via ORAL
  Filled 2012-11-24 (×11): qty 1

## 2012-11-24 MED ORDER — HEPARIN SODIUM (PORCINE) 5000 UNIT/ML IJ SOLN
5000.0000 [IU] | Freq: Three times a day (TID) | INTRAMUSCULAR | Status: DC
  Administered 2012-11-24 – 2012-11-30 (×18): 5000 [IU] via SUBCUTANEOUS
  Filled 2012-11-24 (×21): qty 1

## 2012-11-24 MED ORDER — FUROSEMIDE 10 MG/ML IJ SOLN
40.0000 mg | Freq: Two times a day (BID) | INTRAMUSCULAR | Status: DC
  Administered 2012-11-24: 40 mg via INTRAVENOUS
  Filled 2012-11-24 (×3): qty 4

## 2012-11-24 MED ORDER — MILRINONE IN DEXTROSE 20 MG/100ML IV SOLN
0.2500 ug/kg/min | INTRAVENOUS | Status: DC
  Administered 2012-11-24 – 2012-11-28 (×8): 0.375 ug/kg/min via INTRAVENOUS
  Administered 2012-11-28: 0.25 ug/kg/min via INTRAVENOUS
  Filled 2012-11-24 (×10): qty 100
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
## 2012-11-25 DIAGNOSIS — N182 Chronic kidney disease, stage 2 (mild): Secondary | ICD-10-CM

## 2012-11-25 LAB — BASIC METABOLIC PANEL
BUN: 25 mg/dL — ABNORMAL HIGH (ref 6–23)
Chloride: 96 mEq/L (ref 96–112)
Creatinine, Ser: 1.4 mg/dL — ABNORMAL HIGH (ref 0.50–1.10)
GFR calc Af Amer: 41 mL/min — ABNORMAL LOW (ref >90)
Glucose, Bld: 217 mg/dL — ABNORMAL HIGH (ref 70–99)
Potassium: 3.8 mEq/L (ref 3.5–5.1)

## 2012-11-25 LAB — GLUCOSE, CAPILLARY
Glucose-Capillary: 274 mg/dL — ABNORMAL HIGH (ref 70–99)
Glucose-Capillary: 287 mg/dL — ABNORMAL HIGH (ref 70–99)
Glucose-Capillary: 288 mg/dL — ABNORMAL HIGH (ref 70–99)

## 2012-11-25 MED ORDER — FUROSEMIDE 10 MG/ML IJ SOLN: 60.0000 mg | Freq: Two times a day (BID) | INTRAMUSCULAR | Status: DC

## 2012-11-25 MED ORDER — FUROSEMIDE 10 MG/ML IJ SOLN
60.0000 mg | Freq: Two times a day (BID) | INTRAMUSCULAR | Status: DC
  Administered 2012-11-25 (×2): 60 mg via INTRAVENOUS
  Filled 2012-11-25 (×3): qty 6

## 2012-11-25 NOTE — Progress Notes (Signed)
   Primary cardiologist: Dr. [name]  Subjective:   Up eating breakfast. No breathlessness at rest. Neuropathic leg pain.   Objective:   Temp:  [97.4 F (36.3 C)-98 F (36.7 C)] 97.4 F (36.3 C) (06/01 0500) Pulse Rate:  [61-77] 77 (06/01 0500) Resp:  [16-20] 18 (06/01 0500) BP: (105-113)/(43-58) 113/47 mmHg (06/01 0500) SpO2:  [96 %-99 %] 96 % (06/01 0500) Weight:  [164 lb 10.9 oz (74.7 kg)-166 lb 8 oz (75.524 kg)] 166 lb 8 oz (75.524 kg) (06/01 0500) Last BM Date: 11/23/12  Filed Weights   11/24/12 1124 11/24/12 1215 11/25/12 0500  Weight: 164 lb 10.9 oz (74.7 kg) 164 lb 10.9 oz (74.7 kg) 166 lb 8 oz (75.524 kg)    Intake/Output Summary (Last 24 hours) at 11/25/12 0753 Last data filed at 11/25/12 0500  Gross per 24 hour  Intake    931 ml  Output    925 ml  Net      6 ml   Telemetry: Paced rhythm.  Exam:  General: Comfortable at rest.  Lungs: Clear. Nonlabored.  Cardiac: JVP not elevated. RRR, no gallop.  Extremities: Mild edema.  Lab Results:  Basic Metabolic Panel:  Recent Labs Lab 11/24/12 1315 11/25/12 0515  NA 140 135  K 4.2 3.8  CL 97 96  CO2 33* 29  GLUCOSE 233* 217*  BUN 23 25*  CREATININE 1.48* 1.40*  CALCIUM 9.3 8.9    CBC:  Recent Labs Lab 11/24/12 1315  WBC 5.2  HGB 11.8*  HCT 35.0*  MCV 88.2  PLT 118*    BNP:  Recent Labs  04/25/12 1830 11/24/12 1313  PROBNP 4659.0* 1594.0*    Medications:   Scheduled Medications: . aspirin  81 mg Oral QPM  . atorvastatin  10 mg Oral q1800  . carvedilol  6.25 mg Oral BID WC  . citalopram  20 mg Oral Daily  . clopidogrel  75 mg Oral Q breakfast  . famotidine  20 mg Oral BID  . furosemide  40 mg Intravenous BID  . heparin  5,000 Units Subcutaneous Q8H  . hydrALAZINE  25 mg Oral TID  . insulin aspart  0-9 Units Subcutaneous TID WC  . insulin glargine  50 Units Subcutaneous BID  . isosorbide dinitrate  20 mg Oral Q8H  . sodium chloride  3 mL Intravenous Q12H  .  spironolactone  25 mg Oral q morning - 10a     Infusions: . milrinone 0.375 mcg/kg/min (11/25/12 0132)     PRN Medications:  sodium chloride, acetaminophen, ALPRAZolam, HYDROcodone-acetaminophen, nitroGLYCERIN, ondansetron (ZOFRAN) IV, sodium chloride   Assessment:   1. Acute on chronic systolic heart failure. Recent 9 pound weight gain as outpatient.  2. Ischemic cardiomyopathy, LVEF 35%. Medtronic ICD in place.  3. CKD, stage 2. Creatinine 1.4 most recently.  4. Peripheral polyneuropathy.   Plan/Discussion:    Continue Milrinone and increase Lasix to 60 mg IV BID. Renal function stable. No change to other baseline regimen.   Satira Sark, M.D., F.A.C.C.

## 2012-11-26 ENCOUNTER — Ambulatory Visit: Payer: Medicare Other | Admitting: Physician Assistant

## 2012-11-26 LAB — BASIC METABOLIC PANEL
Calcium: 8.9 mg/dL (ref 8.4–10.5)
GFR calc Af Amer: 36 mL/min — ABNORMAL LOW (ref >90)
GFR calc non Af Amer: 31 mL/min — ABNORMAL LOW (ref >90)
Potassium: 3.7 mEq/L (ref 3.5–5.1)
Sodium: 134 mEq/L — ABNORMAL LOW (ref 135–145)

## 2012-11-26 LAB — GLUCOSE, CAPILLARY: Glucose-Capillary: 280 mg/dL — ABNORMAL HIGH (ref 70–99)

## 2012-11-26 MED ORDER — FUROSEMIDE 10 MG/ML IJ SOLN
INTRAMUSCULAR | Status: AC
  Administered 2012-11-26: 60 mg via INTRAVENOUS
  Filled 2012-11-26: qty 8

## 2012-11-26 MED ORDER — POTASSIUM CHLORIDE CRYS ER 20 MEQ PO TBCR
40.0000 meq | EXTENDED_RELEASE_TABLET | Freq: Once | ORAL | Status: AC
  Administered 2012-11-26: 40 meq via ORAL
  Filled 2012-11-26: qty 2

## 2012-11-26 MED ORDER — HYDRALAZINE HCL 10 MG PO TABS
10.0000 mg | ORAL_TABLET | Freq: Once | ORAL | Status: AC
  Administered 2012-11-27: 10 mg via ORAL
  Filled 2012-11-26: qty 1

## 2012-11-26 MED ORDER — FUROSEMIDE 10 MG/ML IJ SOLN
60.0000 mg | Freq: Three times a day (TID) | INTRAMUSCULAR | Status: DC
  Administered 2012-11-26 – 2012-11-28 (×6): 60 mg via INTRAVENOUS
  Filled 2012-11-26 (×7): qty 6

## 2012-11-26 NOTE — Progress Notes (Signed)
Patient ID: [name], female   DOB: December 29, 1933, 79 y.o.   MRN: [id number]    SUBJECTIVE: Main complaints is foot pain due to neuropathy.  Breathing seems better.  She diuresed but not vigorously.    Marland Kitchen aspirin  81 mg Oral QPM  . atorvastatin  10 mg Oral q1800  . carvedilol  6.25 mg Oral BID WC  . citalopram  20 mg Oral Daily  . clopidogrel  75 mg Oral Q breakfast  . famotidine  20 mg Oral BID  . furosemide  60 mg Intravenous Q8H  . heparin  5,000 Units Subcutaneous Q8H  . hydrALAZINE  25 mg Oral TID  . insulin aspart  0-9 Units Subcutaneous TID WC  . insulin glargine  50 Units Subcutaneous BID  . isosorbide dinitrate  20 mg Oral Q8H  . potassium chloride  40 mEq Oral Once  . sodium chloride  3 mL Intravenous Q12H  . spironolactone  25 mg Oral q morning - 10a  milrinone gtt @ 0.375    Filed Vitals:   11/25/12 1400 11/25/12 2100 11/25/12 2230 11/26/12 0500  BP: 111/66 95/69 106/48 107/49  Pulse: 78 78  89  Temp: 97.8 F (36.6 C) 97.7 F (36.5 C)  97.9 F (36.6 C)  TempSrc: Oral     Resp: 18 18  18   Height:      Weight:    163 lb 12.8 oz (74.299 kg)  SpO2: 97% 93%  91%    Intake/Output Summary (Last 24 hours) at 11/26/12 0640 Last data filed at 11/25/12 2300  Gross per 24 hour  Intake   1203 ml  Output   2050 ml  Net   -847 ml    LABS: Basic Metabolic Panel:  Recent Labs  11/25/12 0515 11/26/12 0527  NA 135 134*  K 3.8 3.7  CL 96 94*  CO2 29 33*  GLUCOSE 217* 222*  BUN 25* 25*  CREATININE 1.40* 1.58*  CALCIUM 8.9 8.9   Liver Function Tests: No results found for this basename: AST, ALT, ALKPHOS, BILITOT, PROT, ALBUMIN,  in the last 72 hours No results found for this basename: LIPASE, AMYLASE,  in the last 72 hours CBC:  Recent Labs  11/24/12 1315  WBC 5.2  HGB 11.8*  HCT 35.0*  MCV 88.2  PLT 118*   Cardiac Enzymes: No results found for this basename: CKTOTAL, CKMB, CKMBINDEX, TROPONINI,  in the last 72 hours BNP: No components  found with this basename: POCBNP,  D-Dimer: No results found for this basename: DDIMER,  in the last 72 hours Hemoglobin A1C: No results found for this basename: HGBA1C,  in the last 72 hours Fasting Lipid Panel: No results found for this basename: CHOL, HDL, LDLCALC, TRIG, CHOLHDL, LDLDIRECT,  in the last 72 hours Thyroid Function Tests: No results found for this basename: TSH, T4TOTAL, FREET3, T3FREE, THYROIDAB,  in the last 72 hours Anemia Panel: No results found for this basename: VITAMINB12, FOLATE, FERRITIN, TIBC, IRON, RETICCTPCT,  in the last 72 hours  RADIOLOGY: Dg Chest 2 View  11/24/2012   *RADIOLOGY REPORT*  Clinical Data: CHF.  CHEST - 2 VIEW  Comparison: 04/26/12  Findings: Lateral view degraded by patient arm position.  Pacer / AICD device.  There are also pacer wires which are not attached to the battery pack.  Midline trachea.  Moderate cardiomegaly. Mediastinal contours otherwise within normal limits.  Mild right hemidiaphragm elevation. No pleural effusion or pneumothorax. No congestive failure.  Mildly low lung  volumes. Clear lungs.  IMPRESSION: Cardiomegaly and low lung volumes, without acute disease.   Original Report Authenticated By: Abigail Miyamoto, M.D.    PHYSICAL EXAM General: NAD Neck: JVP 10-12 cm, no thyromegaly or thyroid nodule.  Lungs: Slight crackles at bases CV: Nondisplaced PMI.  Heart regular S1/S2, no S3/S4, no murmur.  No peripheral edema.   Abdomen: Soft, nontender, no hepatosplenomegaly, mild distention.  Neurologic: Alert and oriented x 3.  Psych: Normal affect. Extremities: No clubbing or cyanosis.   TELEMETRY: Reviewed telemetry pt in NSR with BiV pacing  ASSESSMENT AND PLAN: 79 yo with history of CAD, ischemic CMP, CKD, and BiV ICD presented with acute on chronic systolic CHF.  She had been told to cut back on her torsemide because of elevated creatinine but gained 10 lbs after the medication change.  1. CHF: Acute on chronic systolic CHF.  She  is on milrinone gtt and Lasix.  - Continue milrinone at current dosing - Increase Lasix to 60 mg every 8 hours, needs more diuresis.  2. CAD: Stable, continue ASA, statin.  3. CKD: Stable, follow BMET.   [name] 11/26/2012 6:44 AM

## 2012-11-26 NOTE — Progress Notes (Signed)
Pt BP 90/56 manually. Pt asymptomatic. Darrick Grinder, NP notified. OK to give aldactone and pain medication at this time. Hold hydralazine. Will continue to monitor closely. Dorna Bloom, RN

## 2012-11-26 NOTE — Progress Notes (Signed)
Ms Laskin requested to change to DNR status today.   Tomorrow Dr Aundra Dubin will discuss deactivating ICD.  In the mean time will change to DNR.   CLEGG,AMY NP-C 3:52 PM

## 2012-11-26 NOTE — Progress Notes (Signed)
Inpatient Diabetes Program Recommendations  AACE/ADA: New Consensus Statement on Inpatient Glycemic Control (2013)  Target Ranges:  Prepandial:   less than 140 mg/dL      Peak postprandial:   less than 180 mg/dL (1-2 hours)      Critically ill patients:  140 - 180 mg/dL     Results for JEYMI, NEWBERN (MRN [id number]) as of 11/26/2012 11:12  Ref. Range 11/25/2012 07:23 11/25/2012 11:43 11/25/2012 16:30 11/25/2012 20:11  Glucose-Capillary Latest Range: 70-99 mg/dL 191 (H) 274 (H) 287 (H) 288 (H)    Results for JEMICA, WEICK (MRN [id number]) as of 11/26/2012 11:12  Ref. Range 11/26/2012 07:32  Glucose-Capillary Latest Range: 70-99 mg/dL 197 (H)    Patient having elevated glucose levels.  Eating 40-100% of meals.  Recommend the following insulin adjustments:  Inpatient Diabetes Program Recommendations Insulin - Basal: Please increase Lantus to 55 units bid Correction (SSI): Please increase Novolog correction scale (SSI) to Moderate scale tid ac + HS (currently on Sensitive scale)  Will follow. Wyn Quaker RN, MSN, CDE Diabetes Coordinator Inpatient Diabetes Program [phone]

## 2012-11-27 LAB — BASIC METABOLIC PANEL
CO2: 30 mEq/L (ref 19–32)
Chloride: 93 mEq/L — ABNORMAL LOW (ref 96–112)
Glucose, Bld: 308 mg/dL — ABNORMAL HIGH (ref 70–99)
Sodium: 133 mEq/L — ABNORMAL LOW (ref 135–145)

## 2012-11-27 LAB — GLUCOSE, CAPILLARY
Glucose-Capillary: 255 mg/dL — ABNORMAL HIGH (ref 70–99)
Glucose-Capillary: 294 mg/dL — ABNORMAL HIGH (ref 70–99)

## 2012-11-27 NOTE — Progress Notes (Signed)
UR Completed Krystal Teachey Graves-Bigelow, RN,BSN [phone]  

## 2012-11-27 NOTE — Progress Notes (Signed)
Inpatient Diabetes Program Recommendations  AACE/ADA: New Consensus Statement on Inpatient Glycemic Control (2013)  Target Ranges:  Prepandial:   less than 140 mg/dL      Peak postprandial:   less than 180 mg/dL (1-2 hours)      Critically ill patients:  140 - 180 mg/dL     Results for GERTHA, HUTMACHER (MRN [id number]) as of 11/27/2012 10:02  Ref. Range 11/26/2012 07:32 11/26/2012 11:55 11/26/2012 16:21 11/26/2012 22:00  Glucose-Capillary Latest Range: 70-99 mg/dL 197 (H) 284 (H) 215 (H) 280 (H)    Patient having elevated glucose levels. Eating 40-100% of meals.   Recommend the following insulin adjustments:   Inpatient Diabetes Program Recommendations Insulin - Basal: Please increase Lantus to 55 units bid Correction (SSI): Please increase Novolog correction scale (SSI) to Moderate scale tid ac + HS (currently on Sensitive scale)   Will follow. Wyn Quaker RN, MSN, CDE Diabetes Coordinator Inpatient Diabetes Program [phone]

## 2012-11-27 NOTE — Care Management Note (Addendum)
    Page 1 of 2   11/30/2012     11:15:42 AM   CARE MANAGEMENT NOTE 11/30/2012  Patient:  [name], [name]   Account Number:  [id number]  Date Initiated:  11/27/2012  Documentation initiated by:  GRAVES-BIGELOW,Eleasha Cataldo  Subjective/Objective Assessment:   Presents with nearly a 10 lb wt gain despite consistent use of torsemide, also progressive renal insufficiency with most recent BUN and creatinine of 70 and 2.2 . Admitted for IV milrinone and lasix.     Action/Plan:   CM will continue to monitor for disposition needs.   Anticipated DC Date:  11/30/2012   Anticipated DC Plan:  [place]  CM consult      [facility] Choice  HOME HEALTH   Choice offered to / List presented to:  [name]        [place] arranged  HH-1 RN  [place].   Status of service:  Completed, signed off Medicare Important Message given?   (If response is "NO", the following Medicare IM given date fields will be blank) Date Medicare IM given:   Date Additional Medicare IM given:    Discharge Disposition:  [place]  Per UR Regulation:  Reviewed for med. necessity/level of care/duration of stay  If discussed at [place] of Stay Meetings, dates discussed:    Comments:   11-30-12 [place], [place] [phone] CM did speak to pt today and she has some concerns with her blood pressure medications at home. She feels like she will need a [name] RN to monitor her medications. Pt lives alone. CM did call MD to get orders for [facility]. CM will make referral for services. SOC to begin within 24-48 hours post d/c. No further needs from CM at this time.  11-29-12 [address] Shirley Krauss, RN,BSN [phone] CM spoke to pt and she will not need any HH services at this time. She stated she will contact [facility] for scales. NO further needs from CM at this time.

## 2012-11-27 NOTE — Progress Notes (Signed)
Patient ID: [name], female   DOB: 06/09/1934, 79 y.o.   MRN: [id number]    SUBJECTIVE: She diuresed better yesterday, breathing better lying down.    Marland Kitchen aspirin  81 mg Oral QPM  . atorvastatin  10 mg Oral q1800  . carvedilol  6.25 mg Oral BID WC  . citalopram  20 mg Oral Daily  . clopidogrel  75 mg Oral Q breakfast  . famotidine  20 mg Oral BID  . furosemide  60 mg Intravenous Q8H  . heparin  5,000 Units Subcutaneous Q8H  . hydrALAZINE  25 mg Oral TID  . insulin aspart  0-9 Units Subcutaneous TID WC  . insulin glargine  50 Units Subcutaneous BID  . isosorbide dinitrate  20 mg Oral Q8H  . sodium chloride  3 mL Intravenous Q12H  . spironolactone  25 mg Oral q morning - 10a  milrinone gtt @ 0.375    Filed Vitals:   11/26/12 2211 11/27/12 0034 11/27/12 0500 11/27/12 0731  BP: 99/61 116/54 92/54 100/52  Pulse: 80 83 76 60  Temp: 97.9 F (36.6 C)  98.2 F (36.8 C)   TempSrc: Oral  Oral   Resp: 18 18 18    Height:      Weight:   163 lb (73.936 kg)   SpO2: 95%  93%     Intake/Output Summary (Last 24 hours) at 11/27/12 0839 Last data filed at 11/27/12 0732  Gross per 24 hour  Intake 849.68 ml  Output   3250 ml  Net -2400.32 ml    LABS: Basic Metabolic Panel:  Recent Labs  11/26/12 0527 11/27/12 0447  NA 134* 133*  K 3.7 4.0  CL 94* 93*  CO2 33* 30  GLUCOSE 222* 308*  BUN 25* 30*  CREATININE 1.58* 1.61*  CALCIUM 8.9 9.4   Liver Function Tests: No results found for this basename: AST, ALT, ALKPHOS, BILITOT, PROT, ALBUMIN,  in the last 72 hours No results found for this basename: LIPASE, AMYLASE,  in the last 72 hours CBC:  Recent Labs  11/24/12 1315  WBC 5.2  HGB 11.8*  HCT 35.0*  MCV 88.2  PLT 118*   Cardiac Enzymes: No results found for this basename: CKTOTAL, CKMB, CKMBINDEX, TROPONINI,  in the last 72 hours BNP: No components found with this basename: POCBNP,  D-Dimer: No results found for this basename: DDIMER,  in the last 72  hours Hemoglobin A1C: No results found for this basename: HGBA1C,  in the last 72 hours Fasting Lipid Panel: No results found for this basename: CHOL, HDL, LDLCALC, TRIG, CHOLHDL, LDLDIRECT,  in the last 72 hours Thyroid Function Tests: No results found for this basename: TSH, T4TOTAL, FREET3, T3FREE, THYROIDAB,  in the last 72 hours Anemia Panel: No results found for this basename: VITAMINB12, FOLATE, FERRITIN, TIBC, IRON, RETICCTPCT,  in the last 72 hours  RADIOLOGY: Dg Chest 2 View  11/24/2012   *RADIOLOGY REPORT*  Clinical Data: CHF.  CHEST - 2 VIEW  Comparison: 04/26/12  Findings: Lateral view degraded by patient arm position.  Pacer / AICD device.  There are also pacer wires which are not attached to the battery pack.  Midline trachea.  Moderate cardiomegaly. Mediastinal contours otherwise within normal limits.  Mild right hemidiaphragm elevation. No pleural effusion or pneumothorax. No congestive failure.  Mildly low lung volumes. Clear lungs.  IMPRESSION: Cardiomegaly and low lung volumes, without acute disease.   Original Report Authenticated By: Abigail Miyamoto, M.D.    PHYSICAL EXAM General:  NAD Neck: JVP 9-10 cm, no thyromegaly or thyroid nodule.  Lungs: Slight crackles at bases CV: Nondisplaced PMI.  Heart regular S1/S2, no S3/S4, no murmur.  No peripheral edema.   Abdomen: Soft, nontender, no hepatosplenomegaly, mild distention.  Neurologic: Alert and oriented x 3.  Psych: Normal affect. Extremities: No clubbing or cyanosis.   TELEMETRY: Reviewed telemetry pt in NSR with BiV pacing  ASSESSMENT AND PLAN: 79 yo with history of CAD, ischemic CMP, CKD, and BiV ICD presented with acute on chronic systolic CHF.  She had been told to cut back on her torsemide because of elevated creatinine but gained 10 lbs after the medication change.  1. CHF: Acute on chronic systolic CHF.  She is on milrinone gtt and Lasix.  She diuresed better yesterday.  Mild rise in BUN/creatinine. - Continue  milrinone and Lasix at current dosing - Suspect we will convert over to po diuretics tomorrow and start to wean milrinone.  - She is DNR.  We discussed turning off ICD.  She is thinking about this.  2. CAD: Stable, continue ASA, statin.  3. CKD: Stable, follow BMET.   [name] 11/27/2012 8:39 AM

## 2012-11-28 LAB — BASIC METABOLIC PANEL
BUN: 35 mg/dL — ABNORMAL HIGH (ref 6–23)
CO2: 30 mEq/L (ref 19–32)
Calcium: 10.1 mg/dL (ref 8.4–10.5)
Creatinine, Ser: 1.66 mg/dL — ABNORMAL HIGH (ref 0.50–1.10)
Glucose, Bld: 223 mg/dL — ABNORMAL HIGH (ref 70–99)

## 2012-11-28 LAB — CBC
MCH: 29.9 pg (ref 26.0–34.0)
MCHC: 34.9 g/dL (ref 30.0–36.0)
MCV: 85.7 fL (ref 78.0–100.0)
Platelets: 149 10*3/uL — ABNORMAL LOW (ref 150–400)
RBC: 4.05 MIL/uL (ref 3.87–5.11)
RDW: 14 % (ref 11.5–15.5)

## 2012-11-28 LAB — GLUCOSE, CAPILLARY
Glucose-Capillary: 261 mg/dL — ABNORMAL HIGH (ref 70–99)
Glucose-Capillary: 229 mg/dL — ABNORMAL HIGH (ref 70–99)

## 2012-11-28 MED ORDER — TORSEMIDE 20 MG PO TABS
80.0000 mg | ORAL_TABLET | Freq: Every day | ORAL | Status: DC
  Administered 2012-11-28 – 2012-11-30 (×3): 80 mg via ORAL
  Filled 2012-11-28 (×3): qty 4

## 2012-11-28 NOTE — Progress Notes (Signed)
Patient ID: [name], female   DOB: [birth year], 79 y.o.   MRN: [id number]    SUBJECTIVE: Reasonable diuresis, breathing better lying down.    Marland Kitchen aspirin  81 mg Oral QPM  . atorvastatin  10 mg Oral q1800  . carvedilol  6.25 mg Oral BID WC  . citalopram  20 mg Oral Daily  . clopidogrel  75 mg Oral Q breakfast  . famotidine  20 mg Oral BID  . heparin  5,000 Units Subcutaneous Q8H  . hydrALAZINE  25 mg Oral TID  . insulin aspart  0-9 Units Subcutaneous TID WC  . insulin glargine  50 Units Subcutaneous BID  . isosorbide dinitrate  20 mg Oral Q8H  . sodium chloride  3 mL Intravenous Q12H  . spironolactone  25 mg Oral q morning - 10a  . torsemide  80 mg Oral Daily  milrinone gtt @ 0.375    Filed Vitals:   11/27/12 1634 11/27/12 2120 11/27/12 2225 11/28/12 0606  BP: 106/58 111/56 112/49 117/55  Pulse:   86 91  Temp:   97.9 F (36.6 C) 97.7 F (36.5 C)  TempSrc:      Resp:   18 18  Height:      Weight:    162 lb (73.483 kg)  SpO2:   94% 95%    Intake/Output Summary (Last 24 hours) at 11/28/12 0720 Last data filed at 11/28/12 0600  Gross per 24 hour  Intake    603 ml  Output   1700 ml  Net  -1097 ml    LABS: Basic Metabolic Panel:  Recent Labs  11/27/12 0447 11/28/12 0415  NA 133* 136  K 4.0 4.0  CL 93* 95*  CO2 30 30  GLUCOSE 308* 223*  BUN 30* 35*  CREATININE 1.61* 1.66*  CALCIUM 9.4 10.1   Liver Function Tests: No results found for this basename: AST, ALT, ALKPHOS, BILITOT, PROT, ALBUMIN,  in the last 72 hours No results found for this basename: LIPASE, AMYLASE,  in the last 72 hours CBC:  Recent Labs  11/28/12 0415  WBC 4.8  HGB 12.1  HCT 34.7*  MCV 85.7  PLT 149*   Cardiac Enzymes: No results found for this basename: CKTOTAL, CKMB, CKMBINDEX, TROPONINI,  in the last 72 hours BNP: No components found with this basename: POCBNP,  D-Dimer: No results found for this basename: DDIMER,  in the last 72 hours Hemoglobin A1C: No results  found for this basename: HGBA1C,  in the last 72 hours Fasting Lipid Panel: No results found for this basename: CHOL, HDL, LDLCALC, TRIG, CHOLHDL, LDLDIRECT,  in the last 72 hours Thyroid Function Tests: No results found for this basename: TSH, T4TOTAL, FREET3, T3FREE, THYROIDAB,  in the last 72 hours Anemia Panel: No results found for this basename: VITAMINB12, FOLATE, FERRITIN, TIBC, IRON, RETICCTPCT,  in the last 72 hours  RADIOLOGY: Dg Chest 2 View  11/24/2012   *RADIOLOGY REPORT*  Clinical Data: CHF.  CHEST - 2 VIEW  Comparison: 04/26/12  Findings: Lateral view degraded by patient arm position.  Pacer / AICD device.  There are also pacer wires which are not attached to the battery pack.  Midline trachea.  Moderate cardiomegaly. Mediastinal contours otherwise within normal limits.  Mild right hemidiaphragm elevation. No pleural effusion or pneumothorax. No congestive failure.  Mildly low lung volumes. Clear lungs.  IMPRESSION: Cardiomegaly and low lung volumes, without acute disease.   Original Report Authenticated By: Abigail Miyamoto, M.D.    PHYSICAL  EXAM General: NAD Neck: JVP 9 cm, no thyromegaly or thyroid nodule.  Lungs: Slight crackles at bases CV: Nondisplaced PMI.  Heart regular S1/S2, no S3/S4, no murmur.  No peripheral edema.   Abdomen: Soft, nontender, no hepatosplenomegaly, mild distention.  Neurologic: Alert and oriented x 3.  Psych: Normal affect. Extremities: No clubbing or cyanosis.   TELEMETRY: Reviewed telemetry pt in NSR with BiV pacing  ASSESSMENT AND PLAN: 79 yo with history of CAD, ischemic CMP, CKD, and BiV ICD presented with acute on chronic systolic CHF.  She had been told to cut back on her torsemide because of elevated creatinine but gained 10 lbs after the medication change.  1. CHF: Acute on chronic systolic CHF.  She is on milrinone gtt and Lasix.  She diuresed reasonably yesterday.  Mild rise in BUN/creatinine. - Transition to po torsemide 80 daily.  -  Begin to wean milrinone.  - She is DNR.  We discussed turning off ICD.  She is thinking about this.  2. CAD: Stable, continue ASA, statin.  3. CKD: Stable, follow BMET.   [name] 11/28/2012 7:20 AM

## 2012-11-28 NOTE — Progress Notes (Signed)
6/4  CBGs on 6/3: 255-294-311-235 mg/dl   6/4: 224-261 mg/dl Recommend increasing Novolog correction scale to MODERATE AC & HS.  May need to increase Lantus slightly to 55 units BID if CBGs continue greater than 180 mg/dl.   Harvel Ricks RN BSN CDE

## 2012-11-29 LAB — BASIC METABOLIC PANEL
CO2: 33 mEq/L — ABNORMAL HIGH (ref 19–32)
Chloride: 94 mEq/L — ABNORMAL LOW (ref 96–112)
Creatinine, Ser: 1.94 mg/dL — ABNORMAL HIGH (ref 0.50–1.10)
Glucose, Bld: 167 mg/dL — ABNORMAL HIGH (ref 70–99)

## 2012-11-29 MED ORDER — FAMOTIDINE 20 MG PO TABS
20.0000 mg | ORAL_TABLET | Freq: Every day | ORAL | Status: DC
  Administered 2012-11-30: 20 mg via ORAL
  Filled 2012-11-29: qty 1

## 2012-11-29 MED ORDER — INSULIN GLARGINE 100 UNIT/ML ~~LOC~~ SOLN
55.0000 [IU] | Freq: Two times a day (BID) | SUBCUTANEOUS | Status: DC
  Administered 2012-11-29 – 2012-11-30 (×3): 55 [IU] via SUBCUTANEOUS
  Filled 2012-11-29 (×4): qty 0.55

## 2012-11-29 NOTE — Progress Notes (Signed)
MD on call notified of pt's bp of 94/48 verbal order to hold 25 mg of hydralazine. Will continue to monitor the pt. Hoover Brunette, RN

## 2012-11-29 NOTE — Progress Notes (Signed)
Patient ID: [name], female   DOB: 24-Jan-1934, 79 y.o.   MRN: [id number]    SUBJECTIVE: Stable breathing, walking in hall.  Creatinine up a bit today.   Marland Kitchen aspirin  81 mg Oral QPM  . atorvastatin  10 mg Oral q1800  . carvedilol  6.25 mg Oral BID WC  . citalopram  20 mg Oral Daily  . clopidogrel  75 mg Oral Q breakfast  . famotidine  20 mg Oral BID  . heparin  5,000 Units Subcutaneous Q8H  . hydrALAZINE  25 mg Oral TID  . insulin aspart  0-9 Units Subcutaneous TID WC  . insulin glargine  55 Units Subcutaneous BID  . isosorbide dinitrate  20 mg Oral Q8H  . sodium chloride  3 mL Intravenous Q12H  . spironolactone  25 mg Oral q morning - 10a  . torsemide  80 mg Oral Daily  milrinone gtt @ 0.25    Filed Vitals:   11/28/12 0951 11/28/12 1351 11/28/12 2125 11/29/12 0529  BP: 110/50 97/45 114/65 98/60  Pulse:  91 82 79  Temp:  98 F (36.7 C) 98.4 F (36.9 C) 97.7 F (36.5 C)  TempSrc:   Oral Oral  Resp:  17 18 18   Height:      Weight:    164 lb 8 oz (74.617 kg)  SpO2:  96% 95% 93%    Intake/Output Summary (Last 24 hours) at 11/29/12 0718 Last data filed at 11/28/12 1700  Gross per 24 hour  Intake   1080 ml  Output   1000 ml  Net     80 ml    LABS: Basic Metabolic Panel:  Recent Labs  11/28/12 0415 11/29/12 0532  NA 136 136  K 4.0 4.0  CL 95* 94*  CO2 30 33*  GLUCOSE 223* 167*  BUN 35* 42*  CREATININE 1.66* 1.94*  CALCIUM 10.1 9.9   Liver Function Tests: No results found for this basename: AST, ALT, ALKPHOS, BILITOT, PROT, ALBUMIN,  in the last 72 hours No results found for this basename: LIPASE, AMYLASE,  in the last 72 hours CBC:  Recent Labs  11/28/12 0415  WBC 4.8  HGB 12.1  HCT 34.7*  MCV 85.7  PLT 149*   Cardiac Enzymes: No results found for this basename: CKTOTAL, CKMB, CKMBINDEX, TROPONINI,  in the last 72 hours BNP: No components found with this basename: POCBNP,  D-Dimer: No results found for this basename: DDIMER,  in the  last 72 hours Hemoglobin A1C: No results found for this basename: HGBA1C,  in the last 72 hours Fasting Lipid Panel: No results found for this basename: CHOL, HDL, LDLCALC, TRIG, CHOLHDL, LDLDIRECT,  in the last 72 hours Thyroid Function Tests: No results found for this basename: TSH, T4TOTAL, FREET3, T3FREE, THYROIDAB,  in the last 72 hours Anemia Panel: No results found for this basename: VITAMINB12, FOLATE, FERRITIN, TIBC, IRON, RETICCTPCT,  in the last 72 hours  RADIOLOGY: Dg Chest 2 View  11/24/2012   *RADIOLOGY REPORT*  Clinical Data: CHF.  CHEST - 2 VIEW  Comparison: 04/26/12  Findings: Lateral view degraded by patient arm position.  Pacer / AICD device.  There are also pacer wires which are not attached to the battery pack.  Midline trachea.  Moderate cardiomegaly. Mediastinal contours otherwise within normal limits.  Mild right hemidiaphragm elevation. No pleural effusion or pneumothorax. No congestive failure.  Mildly low lung volumes. Clear lungs.  IMPRESSION: Cardiomegaly and low lung volumes, without acute disease.  Original Report Authenticated By: Abigail Miyamoto, M.D.    PHYSICAL EXAM General: NAD Neck: JVP 7-8 cm, no thyromegaly or thyroid nodule.  Lungs: Slight crackles at bases CV: Nondisplaced PMI.  Heart regular S1/S2, no S3/S4, no murmur.  No peripheral edema.   Abdomen: Soft, nontender, no hepatosplenomegaly, mild distention.  Neurologic: Alert and oriented x 3.  Psych: Normal affect. Extremities: No clubbing or cyanosis.   TELEMETRY: Reviewed telemetry pt in NSR with BiV pacing  ASSESSMENT AND PLAN: 79 yo with history of CAD, ischemic CMP, CKD, and BiV ICD presented with acute on chronic systolic CHF.  She had been told to cut back on her torsemide because of elevated creatinine but gained 10 lbs after the medication change.  1. CHF: Acute on chronic systolic CHF.  Initially on milrinone and Lasix IV, now on po diuretics and milrinone titrated down yesterday.  -  Stop milrinone today, continue po torsemide and current hydralazine/nitrates.  - She is DNR.  We discussed turning off ICD.  She is thinking about this.  2. CAD: Stable, continue ASA, statin.  3. CKD: Creatinine/BUN up a bit today.  I am going to watch her until tomorrow off milrinone to make sure she feels ok and creatinine does not rise excessively.  4. Disposition: Home tomorrow with home health.    [name] 11/29/2012 7:18 AM

## 2012-11-30 ENCOUNTER — Telehealth: Payer: Self-pay | Admitting: Physician Assistant

## 2012-11-30 ENCOUNTER — Telehealth: Payer: Self-pay | Admitting: Cardiology

## 2012-11-30 LAB — CBC
HCT: 35 % — ABNORMAL LOW (ref 36.0–46.0)
Hemoglobin: 11.8 g/dL — ABNORMAL LOW (ref 12.0–15.0)
MCH: 29.4 pg (ref 26.0–34.0)
MCV: 87.3 fL (ref 78.0–100.0)
Platelets: 142 10*3/uL — ABNORMAL LOW (ref 150–400)
RBC: 4.01 MIL/uL (ref 3.87–5.11)

## 2012-11-30 LAB — BASIC METABOLIC PANEL
CO2: 33 mEq/L — ABNORMAL HIGH (ref 19–32)
Calcium: 9.4 mg/dL (ref 8.4–10.5)
Glucose, Bld: 124 mg/dL — ABNORMAL HIGH (ref 70–99)
Sodium: 137 mEq/L (ref 135–145)

## 2012-11-30 LAB — GLUCOSE, CAPILLARY

## 2012-11-30 MED ORDER — TORSEMIDE 20 MG PO TABS: 80.0000 mg | ORAL_TABLET | Freq: Every day | ORAL | Status: DC

## 2012-11-30 MED ORDER — SPIRONOLACTONE 25 MG PO TABS: 25.0000 mg | ORAL_TABLET | Freq: Every morning | ORAL | Status: DC

## 2012-11-30 MED ORDER — HYDRALAZINE HCL 25 MG PO TABS: 25.0000 mg | ORAL_TABLET | Freq: Three times a day (TID) | ORAL | Status: DC

## 2012-11-30 MED ORDER — ISOSORBIDE DINITRATE 20 MG PO TABS: 20.0000 mg | ORAL_TABLET | Freq: Three times a day (TID) | ORAL | Status: DC

## 2012-11-30 NOTE — Progress Notes (Signed)
Patient ID: [name], female   DOB: March 22, 1934, 79 y.o.   MRN: [id number]    SUBJECTIVE: Stable breathing, walking in hall.  Creatinine stable.   Marland Kitchen aspirin  81 mg Oral QPM  . atorvastatin  10 mg Oral q1800  . carvedilol  6.25 mg Oral BID WC  . citalopram  20 mg Oral Daily  . clopidogrel  75 mg Oral Q breakfast  . famotidine  20 mg Oral Daily  . heparin  5,000 Units Subcutaneous Q8H  . hydrALAZINE  25 mg Oral TID  . insulin aspart  0-9 Units Subcutaneous TID WC  . insulin glargine  55 Units Subcutaneous BID  . isosorbide dinitrate  20 mg Oral Q8H  . sodium chloride  3 mL Intravenous Q12H  . spironolactone  25 mg Oral q morning - 10a  . torsemide  80 mg Oral Daily     Filed Vitals:   11/29/12 1400 11/29/12 2037 11/29/12 2230 11/30/12 0611  BP: 100/58 100/50 98/48 90/56   Pulse: 81 79  66  Temp: 98 F (36.7 C) 97.9 F (36.6 C)  98 F (36.7 C)  TempSrc: Oral Oral  Oral  Resp: 17   18  Height:      Weight:    161 lb 8 oz (73.256 kg)  SpO2: 95% 96%  95%    Intake/Output Summary (Last 24 hours) at 11/30/12 0849 Last data filed at 11/30/12 0705  Gross per 24 hour  Intake    240 ml  Output   1150 ml  Net   -910 ml    LABS: Basic Metabolic Panel:  Recent Labs  11/29/12 0532 11/30/12 0536  NA 136 137  K 4.0 4.2  CL 94* 96  CO2 33* 33*  GLUCOSE 167* 124*  BUN 42* 46*  CREATININE 1.94* 1.96*  CALCIUM 9.9 9.4   Liver Function Tests: No results found for this basename: AST, ALT, ALKPHOS, BILITOT, PROT, ALBUMIN,  in the last 72 hours No results found for this basename: LIPASE, AMYLASE,  in the last 72 hours CBC:  Recent Labs  11/28/12 0415 11/30/12 0536  WBC 4.8 4.3  HGB 12.1 11.8*  HCT 34.7* 35.0*  MCV 85.7 87.3  PLT 149* 142*   Cardiac Enzymes: No results found for this basename: CKTOTAL, CKMB, CKMBINDEX, TROPONINI,  in the last 72 hours BNP: No components found with this basename: POCBNP,  D-Dimer: No results found for this basename:  DDIMER,  in the last 72 hours Hemoglobin A1C: No results found for this basename: HGBA1C,  in the last 72 hours Fasting Lipid Panel: No results found for this basename: CHOL, HDL, LDLCALC, TRIG, CHOLHDL, LDLDIRECT,  in the last 72 hours Thyroid Function Tests: No results found for this basename: TSH, T4TOTAL, FREET3, T3FREE, THYROIDAB,  in the last 72 hours Anemia Panel: No results found for this basename: VITAMINB12, FOLATE, FERRITIN, TIBC, IRON, RETICCTPCT,  in the last 72 hours  RADIOLOGY: Dg Chest 2 View  11/24/2012   *RADIOLOGY REPORT*  Clinical Data: CHF.  CHEST - 2 VIEW  Comparison: 04/26/12  Findings: Lateral view degraded by patient arm position.  Pacer / AICD device.  There are also pacer wires which are not attached to the battery pack.  Midline trachea.  Moderate cardiomegaly. Mediastinal contours otherwise within normal limits.  Mild right hemidiaphragm elevation. No pleural effusion or pneumothorax. No congestive failure.  Mildly low lung volumes. Clear lungs.  IMPRESSION: Cardiomegaly and low lung volumes, without acute disease.   Original  Report Authenticated By: Abigail Miyamoto, M.D.    PHYSICAL EXAM General: NAD Neck: JVP 7 cm, no thyromegaly or thyroid nodule.  Lungs: Slight crackles at bases CV: Nondisplaced PMI.  Heart regular S1/S2, no S3/S4, no murmur.  No peripheral edema.   Abdomen: Soft, nontender, no hepatosplenomegaly, mild distention.  Neurologic: Alert and oriented x 3.  Psych: Normal affect. Extremities: No clubbing or cyanosis.   TELEMETRY: Reviewed telemetry pt in NSR with BiV pacing  ASSESSMENT AND PLAN: 79 yo with history of CAD, ischemic CMP, CKD, and BiV ICD presented with acute on chronic systolic CHF.  She had been told to cut back on her torsemide because of elevated creatinine but gained 10 lbs after the medication change.  1. CHF: Acute on chronic systolic CHF.  Initially on milrinone and Lasix IV, now on po diuretics and off milrinone. She looks  near-euvolemic.  - Will see how her creatinine trends, but may add digoxin in the future.  - She is DNR.  We discussed turning off ICD.  She is thinking about this and will try to decide by her next office visit.  2. CAD: Stable, continue ASA, statin.  3. CKD: BUN/creatinine appear to have plateaued.  4. Disposition:  Home today with home health RN.  She needs close followup in the [place] office (had trouble getting an appointment prior to this admission).  She needs to be seen next week and needs to get a BMET that day.  Cardiac meds: ASA 81, Crestor 5, Coreg 6.25 mg bid, Plavix 75, hydralazine 25 mg tid, Imdur 30, spironolactone 25, torsemide 80 daily.     [name] 11/30/2012 8:49 AM

## 2012-11-30 NOTE — Telephone Encounter (Signed)
Done

## 2012-11-30 NOTE — Discharge Summary (Signed)
Patient ID: [name],  MRN: [id number], DOB/AGE: [birth year] 79 y.o.  Admit date: 11/24/2012 Discharge date: 11/30/2012  Primary Care Provider: [id number] Primary Cardiologist: Einar Crow, MD [facility])  Discharge Diagnoses Principal Problem:   Acute on chronic systolic heart failure  **Net negative diuresis of 4.6 Liters this admission with reduction in weight from 164 lbs on admission to 161 lbs on discharge.  Active Problems:   Ischemic cardiomyopathy   Chronic kidney disease   HYPERLIPIDEMIA-MIXED   Coronary atherosclerosis of native coronary artery   Diabetic neuropathy   Diabetes mellitus  Allergies Allergies  Allergen Reactions  . Metformin     Upset stomach  . Sulfonamide Derivatives     swelling   Procedures  None  History of Present Illness  79 yo female with history of CAD and ischemic cardiomyopathy with an EF of 35-40%.  She was seen in clinic on 11/23/2012 and reported progressive fatigue, DOE, lower extremity edema, and 9 lb weight gain.  Admission was recommended however pt wanted to wait 1 day and thus arrangements were made for elective admission on 5/31.  Hospital Course  Pt was admitted to [facility] on 11/24/2012 and was placed on IV milrinone along with intravenous lasix.  Initially diuresis was sluggish but with additional titration of lasix, diuresis picked up and she began to have weight loss and symptomatic improvement.  She was transitioned from IV lasix back to oral torsemide, at a higher dose than her previous home dose (now on 80mg  daily), on 6/4 and milrinone was subsequently weaned off on 6/5.  With diuresis, she did have elevation in her creatinine from 1.48 on admission to 1.96 today.  Her weight has been reduced to 161 lbs and she had a net negative diuresis of 4.6 Liters for this admission.  She will be discharged home today in good condition.  We have arranged for early f/u and repeat BMET in our [place] office next week.  Discharge  Vitals Blood pressure 90/56, pulse 66, temperature 98 F (36.7 C), temperature source Oral, resp. rate 18, height 5\' 6"  (1.676 m), weight 161 lb 8 oz (73.256 kg), SpO2 95.00%.  Filed Weights   11/28/12 0606 11/29/12 0529 11/30/12 0611  Weight: 162 lb (73.483 kg) 164 lb 8 oz (74.617 kg) 161 lb 8 oz (73.256 kg)   Labs  CBC  Recent Labs  11/28/12 0415 11/30/12 0536  WBC 4.8 4.3  HGB 12.1 11.8*  HCT 34.7* 35.0*  MCV 85.7 87.3  PLT 149* [id number]*   Basic Metabolic Panel  Recent Labs  11/29/12 0532 11/30/12 0536  NA 136 137  K 4.0 4.2  CL 94* 96  CO2 33* 33*  GLUCOSE 167* 124*  BUN 42* 46*  CREATININE 1.94* 1.96*  CALCIUM 9.9 9.4   Disposition  Pt is being discharged home today in good condition.  Follow-up Plans & Appointments  Follow-up Information   Follow up with SERPE, EUGENE, PA-C On 12/07/2012. (3:00 PM;  we will be checking a blood chemistry that day also.)    Contact information:   [address], [place] [address] [phone]       Follow up with TAPPER,DAVID B, MD. (as scheduled)    Contact information:   [address] [place] [address] [phone]      Discharge Medications    Medication List    TAKE these medications       ALPRAZolam 0.5 MG tablet  Commonly known as:  XANAX  Take 0.5 mg by mouth at bedtime as needed. For anxiety/sleep     aspirin 81 MG tablet  Take 81 mg by mouth every evening.     carvedilol 6.25 MG tablet  Commonly known as:  COREG  Take 1 tablet (6.25 mg total) by mouth 2 (two) times daily with a meal.     citalopram 20 MG tablet  Commonly known as:  CELEXA  Take 20 mg by mouth daily.     clopidogrel 75 MG tablet  Commonly known as:  PLAVIX  TAKE ONE TABLET BY MOUTH EVERY DAY WITH BREAKFAST     fexofenadine 180 MG tablet  Commonly known as:  ALLEGRA  Take 180 mg by mouth daily.     hydrALAZINE 25 MG tablet  Commonly known as:  APRESOLINE  Take 1 tablet (25 mg total) by mouth 3 (three) times  daily.     HYDROcodone-acetaminophen 10-325 MG per tablet  Commonly known as:  NORCO  Take 1 tablet by mouth every 6 (six) hours as needed for pain.     insulin glargine 100 UNIT/ML injection  Commonly known as:  LANTUS  Inject 50 Units into the skin 2 (two) times daily.     isosorbide dinitrate 20 MG tablet  Commonly known as:  ISORDIL  Take 1 tablet (20 mg total) by mouth every 8 (eight) hours.     mupirocin ointment 2 %  Commonly known as:  BACTROBAN  Apply 1 application topically 3 (three) times daily. Place on infected foot     nitroGLYCERIN 0.4 MG SL tablet  Commonly known as:  NITROSTAT  Place 1 tablet (0.4 mg total) under the tongue every 5 (five) minutes as needed for chest pain (up to 3 doses).     ranitidine 150 MG tablet  Commonly known as:  ZANTAC  Take 1 tablet (150 mg total) by mouth 2 (two) times daily.     rosuvastatin 5 MG tablet  Commonly known as:  CRESTOR  Take 1 tablet (5 mg total) by mouth every evening.     spironolactone 25 MG tablet  Commonly known as:  ALDACTONE  Take 25 mg by mouth every morning.     torsemide 20 MG tablet  Commonly known as:  DEMADEX  Take 4 tablets (80 mg total) by mouth daily.      Outstanding Labs/Studies  bmet on 11/2011  Duration of Discharge Encounter   Greater than 30 minutes including physician time.  Signed, Murray Hodgkins NP 11/30/2012, 10:28 AM

## 2012-11-30 NOTE — Progress Notes (Signed)
Morning dose of isosorbide dinitrate held d/t pt's bp being 90/56 per Melina Copa. Will pass on to be discussed with rounding doctors this am & continue to monitor the pt. Hoover Brunette

## 2012-11-30 NOTE — Telephone Encounter (Signed)
TCM- PER C BERGE she also needs BMET done on Friday 6/13-srs

## 2012-11-30 NOTE — Telephone Encounter (Signed)
isosorbide dinitrate (ISORDIL) 20 MG tablet   spironolactone (ALDACTONE) 25 MG tablet   hydrALAZINE (APRESOLINE) 25 MG tablet  [facility]  Refills needed please.   Said she called in Monday but they still dont have

## 2012-12-03 MED ORDER — ROSUVASTATIN CALCIUM 5 MG PO TABS: 5.0000 mg | ORAL_TABLET | Freq: Every evening | ORAL | Status: DC

## 2012-12-03 NOTE — Telephone Encounter (Signed)
Patient contacted regarding discharge from [facility] on Friday, 11/30/2012.  Patient understands to follow up with provider [name], PA on Friday, 12/07/2012 at 3:00 at [facility] office.    Patient understands discharge instructions?  Yes   Patient understands medications and regiment?  Yes   Patient understands to bring all medications to this visit?  Yes

## 2012-12-05 ENCOUNTER — Other Ambulatory Visit: Payer: Self-pay | Admitting: *Deleted

## 2012-12-05 MED ORDER — HYDRALAZINE HCL 25 MG PO TABS: 25.0000 mg | ORAL_TABLET | Freq: Three times a day (TID) | ORAL | Status: DC

## 2012-12-07 ENCOUNTER — Ambulatory Visit (INDEPENDENT_AMBULATORY_CARE_PROVIDER_SITE_OTHER): Payer: Medicare Other | Admitting: Physician Assistant

## 2012-12-07 ENCOUNTER — Encounter: Payer: Self-pay | Admitting: Physician Assistant

## 2012-12-07 VITALS — BP 106/56 | HR 67 | Ht 66.0 in | Wt 163.0 lb

## 2012-12-07 DIAGNOSIS — E785 Hyperlipidemia, unspecified: Secondary | ICD-10-CM

## 2012-12-07 DIAGNOSIS — I5022 Chronic systolic (congestive) heart failure: Secondary | ICD-10-CM

## 2012-12-07 DIAGNOSIS — I2510 Atherosclerotic heart disease of native coronary artery without angina pectoris: Secondary | ICD-10-CM

## 2012-12-07 NOTE — Assessment & Plan Note (Signed)
Will check followup labs

## 2012-12-07 NOTE — Assessment & Plan Note (Signed)
Clinically improved following recent hospitalization employing treatment with IV milrinone. Patient remains on previous home dose of Demadex. She has lost 2 pounds since last OV. Will continue current diuretic regimen and check followup labs, including BNP level. Will plan on early clinic followup with Dr. Percival Spanish, for continued close monitoring and management of heart failure regimen. Unable to up titrate carvedilol at present time, secondary to relative hypotension.

## 2012-12-07 NOTE — Assessment & Plan Note (Signed)
Continue low-dose Crestor for plaque stabilization. Target LDL 70 or less, if feasible.

## 2012-12-07 NOTE — Patient Instructions (Addendum)
   Labs for BMET/BNP  Office will contact with results via phone or letter.    No added salt Continue all current medications. Follow up in  3-4 weeks

## 2012-12-07 NOTE — Assessment & Plan Note (Signed)
Quiescent on current medication regimen. Continued aggressive medical management and risk factor modification recommended.

## 2012-12-07 NOTE — Progress Notes (Signed)
Primary Cardiologist: Minus Breeding, MD   HPI: Post hospital followup from [facility], following elective admission on May 31 for aggressive treatment of CHF with IV milrinone, per Dr. Rosezella [place] recommendation.  In addition to IV milrinone, she was also placed on IV Lasix with ultimate net negative diuresis of 4.6 L, and a discharge weight of 161 lbs (164 on admission).  Creatinine rose from 1.5 on admission to 1.96, by time of discharge.  Clinically, she feels much improved overall, with less DOE and much less peripheral edema. He continues to sleep on 2 pillows. She is compliant with her medications, and refrains from added salt in her diet. She weighs herself regularly and is currently in the process of getting a new scale, per [facility],  better suited to her specific needs.  Allergies  Allergen Reactions  . Metformin     Upset stomach  . Sulfonamide Derivatives     swelling    Current Outpatient Prescriptions  Medication Sig Dispense Refill  . ALPRAZolam (XANAX) 0.5 MG tablet Take 0.5 mg by mouth at bedtime as needed. For anxiety/sleep      . aspirin 81 MG tablet Take 81 mg by mouth every evening.       . carvedilol (COREG) 6.25 MG tablet Take 1 tablet (6.25 mg total) by mouth 2 (two) times daily with a meal.  60 tablet  6  . citalopram (CELEXA) 20 MG tablet Take 20 mg by mouth daily.      . clopidogrel (PLAVIX) 75 MG tablet TAKE ONE TABLET BY MOUTH EVERY DAY WITH BREAKFAST  30 tablet  5  . fexofenadine (ALLEGRA) 180 MG tablet Take 180 mg by mouth daily.      . hydrALAZINE (APRESOLINE) 25 MG tablet Take 1 tablet (25 mg total) by mouth 3 (three) times daily.  90 tablet  6  . HYDROcodone-acetaminophen (NORCO) 10-325 MG per tablet Take 1 tablet by mouth every 6 (six) hours as needed for pain.      Marland Kitchen insulin glargine (LANTUS) 100 UNIT/ML injection Inject 50 Units into the skin 2 (two) times daily.      . isosorbide dinitrate (ISORDIL) 20 MG tablet Take 1 tablet (20 mg total) by mouth every 8  (eight) hours.  90 tablet  6  . mupirocin ointment (BACTROBAN) 2 % Apply 1 application topically 3 (three) times daily. Place on infected foot      . nitroGLYCERIN (NITROSTAT) 0.4 MG SL tablet Place 1 tablet (0.4 mg total) under the tongue every 5 (five) minutes as needed for chest pain (up to 3 doses).  25 tablet  3  . ranitidine (ZANTAC) 150 MG tablet Take 1 tablet (150 mg total) by mouth 2 (two) times daily.  60 tablet  6  . rosuvastatin (CRESTOR) 5 MG tablet Take 1 tablet (5 mg total) by mouth every evening.  30 tablet  6  . spironolactone (ALDACTONE) 25 MG tablet Take 1 tablet (25 mg total) by mouth every morning.  30 tablet  6  . torsemide (DEMADEX) 20 MG tablet Take 4 tablets (80 mg total) by mouth daily.  120 tablet  6   No current facility-administered medications for this visit.    Past Medical History  Diagnosis Date  . Type 2 diabetes mellitus   . Anxiety disorder   . GERD (gastroesophageal reflux disease)   . Hyperlipidemia     H/o GI intolerance to Lipitor  . Ischemic cardiomyopathy     s/p Medtronic BiV ICD 2006  . Systolic  and diastolic CHF, chronic     echo 03/2012 EF 123456, mod diastolic dysfunction, mod MR, mod LAE, mod-severe TR, PASP 3mmHg  . Impingement syndrome of left shoulder   . Aneurysm     Apical; previously on Coumadin  . Chronic renal insufficiency     Creatinine 1.17 January 2010  . Peripheral polyneuropathy     Refractory to several medications  . Coronary artery disease     Cath 02/15/12 revealed RCA as culprit lesion w/ occlusion at site of prior stent, patent LAD stent, chronic LCx and Diagonal dz; Medical therapy   . Depression     Past Surgical History  Procedure Laterality Date  . Abdominal hysterectomy    . Cardiac defibrillator placement  05/28/2009    Medtronic ICD placed by Dr. Caryl Comes secondary to ICM/CHF, [phone] Fidelis lead fracture with ICD storm 2010 requiring new RV lead placement  . Insert / replace / remove pacemaker    .  Cholecystectomy    . Eye surgery      History   Social History  . Marital Status: Married    Spouse Name: N/A    Number of Children: N/A  . Years of Education: N/A   Occupational History  . Retired    Social History Main Topics  . Smoking status: Former Smoker -- 0.80 packs/day for 30 years    Types: Cigarettes    Quit date: 06/27/2002  . Smokeless tobacco: Never Used  . Alcohol Use: No  . Drug Use: Not on file  . Sexually Active: Not Currently   Other Topics Concern  . Not on file   Social History Narrative  . No narrative on file    Family History  Problem Relation Age of Onset  . Coronary artery disease Neg Hx     ROS: no nausea, vomiting; no fever, chills; no melena, hematochezia; no claudication  PHYSICAL EXAM: BP 106/56  Pulse 67  Ht 5\' 6"  (1.676 m)  Wt 163 lb (73.936 kg)  BMI 26.32 kg/m2  SpO2 98% GENERAL: 79 year old female; NAD HEENT: NCAT, PERRLA, EOMI; sclera clear; no xanthelasma NECK: Positive JVD on the right, 90 LUNGS: Diminished breath sounds CARDIAC: RRR (S1, S2); no significant murmurs; no rubs or gallops ABDOMEN: soft, non-tender; intact BS EXTREMETIES: no significant peripheral edema SKIN: warm/dry; no obvious rash/lesions MUSCULOSKELETAL: no joint deformity NEURO: no focal deficit; NL affect   EKG:    ASSESSMENT & PLAN:  SYSTOLIC HEART FAILURE, CHRONIC Clinically improved following recent hospitalization employing treatment with IV milrinone. Patient remains on previous home dose of Demadex. She has lost 2 pounds since last OV. Will continue current diuretic regimen and check followup labs, including BNP level. Will plan on early clinic followup with Dr. Percival Spanish, for continued close monitoring and management of heart failure regimen. Unable to up titrate carvedilol at present time, secondary to relative hypotension.  Coronary artery disease Quiescent on current medication regimen. Continued aggressive medical management and risk  factor modification recommended.  HYPERLIPIDEMIA-MIXED Continue low-dose Crestor for plaque stabilization. Target LDL 70 or less, if feasible.  Chronic kidney disease Will check followup labs    Gene Alisea Matte, PAC

## 2012-12-10 ENCOUNTER — Encounter: Payer: Self-pay | Admitting: *Deleted

## 2012-12-19 ENCOUNTER — Encounter: Payer: Self-pay | Admitting: Internal Medicine

## 2012-12-19 ENCOUNTER — Ambulatory Visit (INDEPENDENT_AMBULATORY_CARE_PROVIDER_SITE_OTHER): Payer: Medicare Other | Admitting: Internal Medicine

## 2012-12-19 VITALS — BP 96/57 | HR 72 | Ht 66.0 in | Wt 162.0 lb

## 2012-12-19 DIAGNOSIS — I2510 Atherosclerotic heart disease of native coronary artery without angina pectoris: Secondary | ICD-10-CM

## 2012-12-19 DIAGNOSIS — I255 Ischemic cardiomyopathy: Secondary | ICD-10-CM

## 2012-12-19 DIAGNOSIS — I5022 Chronic systolic (congestive) heart failure: Secondary | ICD-10-CM

## 2012-12-19 DIAGNOSIS — I2589 Other forms of chronic ischemic heart disease: Secondary | ICD-10-CM

## 2012-12-19 DIAGNOSIS — I5023 Acute on chronic systolic (congestive) heart failure: Secondary | ICD-10-CM

## 2012-12-19 LAB — ICD DEVICE OBSERVATION
AL AMPLITUDE: 1.4 mv
FVT: 0
LV LEAD THRESHOLD: 2 V
RV LEAD AMPLITUDE: 17.1 mv
RV LEAD IMPEDENCE ICD: 437 Ohm
RV LEAD THRESHOLD: 1 V
TOT-0006: 20101202000000
TZAT-0001SLOWVT: 1
TZAT-0001SLOWVT: 2
TZAT-0002ATACH: NEGATIVE
TZAT-0002ATACH: NEGATIVE
TZAT-0002ATACH: NEGATIVE
TZAT-0002FASTVT: NEGATIVE
TZAT-0011SLOWVT: 10 ms
TZAT-0011SLOWVT: 10 ms
TZAT-0012ATACH: 150 ms
TZAT-0012FASTVT: 200 ms
TZAT-0018ATACH: NEGATIVE
TZAT-0018SLOWVT: NEGATIVE
TZAT-0018SLOWVT: NEGATIVE
TZAT-0019ATACH: 6 V
TZAT-0019ATACH: 6 V
TZAT-0019FASTVT: 8 V
TZAT-0019SLOWVT: 8 V
TZAT-0019SLOWVT: 8 V
TZAT-0020ATACH: 1.5 ms
TZAT-0020ATACH: 1.5 ms
TZAT-0020FASTVT: 1.5 ms
TZAT-0020SLOWVT: 1.5 ms
TZAT-0020SLOWVT: 1.5 ms
TZON-0003VSLOWVT: 450 ms
TZST-0001ATACH: 5
TZST-0001FASTVT: 5
TZST-0001SLOWVT: 3
TZST-0001SLOWVT: 5
TZST-0002ATACH: NEGATIVE
TZST-0002ATACH: NEGATIVE
TZST-0002FASTVT: NEGATIVE
TZST-0002FASTVT: NEGATIVE
TZST-0002FASTVT: NEGATIVE
TZST-0003SLOWVT: 15 J
TZST-0003SLOWVT: 35 J
VF: 0

## 2012-12-19 NOTE — Patient Instructions (Addendum)
Your physician recommends that you schedule a follow-up appointment in: 6 months. You will receive a reminder letter in the mail in about 4 months reminding you to call and schedule your appointment. If you don't receive this letter, please contact our office. Your next home device check is in 3 months. Your physician recommends that you continue on your current medications as directed. Please refer to the Current Medication list given to you today. Your physician has recommended that you have an AV optimization echo. During this procedure, an echocardiogram is performed to optimize the timing of your device using ultrasound and a device programmer. Changes will be made to the device settings to help the heart chambers pump more efficiently. This procedure takes approximately one hour.

## 2012-12-19 NOTE — Progress Notes (Signed)
PCP:Deloria Lair, MD Primary Cardiologist:  Dr Percival Spanish  The patient presents today for routine electrophysiology followup.  Since last being seen in our clinic, the patient has struggled with CHF.  She has required hospitalization and inotropes.  She is making slow improvement.  Her SOB is stable. Today, she denies symptoms of palpitations, chest pain,  lower extremity edema, dizziness, presyncope, syncope, or neurologic sequela.  The patient feels that she is tolerating medications without difficulties and is otherwise without complaint today.   Past Medical History  Diagnosis Date  . Type 2 diabetes mellitus   . Anxiety disorder   . GERD (gastroesophageal reflux disease)   . Hyperlipidemia     H/o GI intolerance to Lipitor  . Ischemic cardiomyopathy     s/p Medtronic BiV ICD 2006  . Systolic and diastolic CHF, chronic     echo 03/2012 EF 123456, mod diastolic dysfunction, mod MR, mod LAE, mod-severe TR, PASP 66mmHg  . Impingement syndrome of left shoulder   . Aneurysm     Apical; previously on Coumadin  . Chronic renal insufficiency     Creatinine 1.17 January 2010  . Peripheral polyneuropathy     Refractory to several medications  . Coronary artery disease     Cath 02/15/12 revealed RCA as culprit lesion w/ occlusion at site of prior stent, patent LAD stent, chronic LCx and Diagonal dz; Medical therapy   . Depression    Past Surgical History  Procedure Laterality Date  . Abdominal hysterectomy    . Cardiac defibrillator placement  05/28/2009    Medtronic ICD placed by Dr. Caryl Comes secondary to ICM/CHF, [phone] Fidelis lead fracture with ICD storm 2010 requiring new RV lead placement  . Insert / replace / remove pacemaker    . Cholecystectomy    . Eye surgery      Current Outpatient Prescriptions  Medication Sig Dispense Refill  . ALPRAZolam (XANAX) 0.5 MG tablet Take 0.5 mg by mouth at bedtime as needed. For anxiety/sleep      . aspirin 81 MG tablet Take 81 mg by mouth every  evening.       . carvedilol (COREG) 6.25 MG tablet Take 1 tablet (6.25 mg total) by mouth 2 (two) times daily with a meal.  60 tablet  6  . citalopram (CELEXA) 20 MG tablet Take 20 mg by mouth daily.      . clopidogrel (PLAVIX) 75 MG tablet TAKE ONE TABLET BY MOUTH EVERY DAY WITH BREAKFAST  30 tablet  5  . fexofenadine (ALLEGRA) 180 MG tablet Take 180 mg by mouth daily.      . hydrALAZINE (APRESOLINE) 25 MG tablet Take 1 tablet (25 mg total) by mouth 3 (three) times daily.  90 tablet  6  . HYDROcodone-acetaminophen (NORCO) 10-325 MG per tablet Take 1 tablet by mouth every 6 (six) hours as needed for pain.      Marland Kitchen insulin glargine (LANTUS) 100 UNIT/ML injection Inject 50 Units into the skin 2 (two) times daily.      . isosorbide dinitrate (ISORDIL) 20 MG tablet Take 1 tablet (20 mg total) by mouth every 8 (eight) hours.  90 tablet  6  . mupirocin ointment (BACTROBAN) 2 % Apply 1 application topically 3 (three) times daily. Place on infected foot      . nitroGLYCERIN (NITROSTAT) 0.4 MG SL tablet Place 1 tablet (0.4 mg total) under the tongue every 5 (five) minutes as needed for chest pain (up to 3 doses).  25 tablet  3  .  ranitidine (ZANTAC) 150 MG tablet Take 1 tablet (150 mg total) by mouth 2 (two) times daily.  60 tablet  6  . rosuvastatin (CRESTOR) 5 MG tablet Take 1 tablet (5 mg total) by mouth every evening.  30 tablet  6  . spironolactone (ALDACTONE) 25 MG tablet Take 1 tablet (25 mg total) by mouth every morning.  30 tablet  6  . torsemide (DEMADEX) 20 MG tablet Take 4 tablets (80 mg total) by mouth daily.  120 tablet  6   No current facility-administered medications for this visit.    Allergies  Allergen Reactions  . Metformin     Upset stomach  . Sulfonamide Derivatives     swelling    History   Social History  . Marital Status: Married    Spouse Name: N/A    Number of Children: N/A  . Years of Education: N/A   Occupational History  . Retired    Social History Main Topics   . Smoking status: Former Smoker -- 0.80 packs/day for 30 years    Types: Cigarettes    Quit date: 06/27/2002  . Smokeless tobacco: Never Used  . Alcohol Use: No  . Drug Use: Not on file  . Sexually Active: Not Currently   Other Topics Concern  . Not on file   Social History Narrative  . No narrative on file    Family History  Problem Relation Age of Onset  . Coronary artery disease Neg Hx    Physical Exam: Filed Vitals:   12/19/12 0904  BP: 96/57  Pulse: 72  Height: 5\' 6"  (1.676 m)  Weight: 162 lb (73.483 kg)    GEN- The patient is well appearing, alert and oriented x 3 today.   Head- normocephalic, atraumatic Eyes-  Sclera clear, conjunctiva pink Ears- hearing intact Oropharynx- clear Neck- supple,  Lungs- Clear to ausculation bilaterally, normal work of breathing Chest- ICD pocket is well healed Heart- Regular rate and rhythm, decreased heart sounds GI- soft, NT, ND, + BS Extremities- no clubbing, cyanosis, or edema Neuro- strength and sensation are intact  ICD interrogation- reviewed in detail today,  See PACEART report ekg today reveals sinus rhythm with BiV pacing (QRS 135msec) Assessment and Plan:   1. Acute on chronic systolic dysfunction/ ischemic CM This has been a real challenge lately.  Dr Percival Spanish and Gene are following closely. Normal BiV function See Pace Art report No changes today Though her EKG shows a very nice and narrow qrs, I think that echo optimization is prudent given her difficulty with CHF.  It may be that with her valvular disease, adjustment of AV timing may be beneficial I will schedule AV optimization. Return to see me in 6 months carelink in 3 months

## 2012-12-25 ENCOUNTER — Other Ambulatory Visit (HOSPITAL_COMMUNITY): Payer: Medicare Other

## 2013-01-01 ENCOUNTER — Other Ambulatory Visit (HOSPITAL_COMMUNITY): Payer: Medicare Other

## 2013-01-02 ENCOUNTER — Ambulatory Visit (HOSPITAL_COMMUNITY): Payer: Medicare Other | Attending: Internal Medicine | Admitting: Radiology

## 2013-01-02 ENCOUNTER — Encounter: Payer: Self-pay | Admitting: Internal Medicine

## 2013-01-02 ENCOUNTER — Ambulatory Visit: Payer: Medicare Other | Admitting: Cardiology

## 2013-01-02 ENCOUNTER — Ambulatory Visit (INDEPENDENT_AMBULATORY_CARE_PROVIDER_SITE_OTHER): Payer: Medicare Other | Admitting: *Deleted

## 2013-01-02 DIAGNOSIS — I2589 Other forms of chronic ischemic heart disease: Secondary | ICD-10-CM | POA: Insufficient documentation

## 2013-01-02 DIAGNOSIS — Z95810 Presence of automatic (implantable) cardiac defibrillator: Secondary | ICD-10-CM

## 2013-01-02 DIAGNOSIS — I2510 Atherosclerotic heart disease of native coronary artery without angina pectoris: Secondary | ICD-10-CM

## 2013-01-02 DIAGNOSIS — I255 Ischemic cardiomyopathy: Secondary | ICD-10-CM

## 2013-01-02 DIAGNOSIS — I5022 Chronic systolic (congestive) heart failure: Secondary | ICD-10-CM

## 2013-01-02 DIAGNOSIS — I059 Rheumatic mitral valve disease, unspecified: Secondary | ICD-10-CM | POA: Insufficient documentation

## 2013-01-02 LAB — ICD DEVICE OBSERVATION
AL AMPLITUDE: 1.125 mv
ATRIAL PACING ICD: 5.5 pct
BAMS-0001: 170 {beats}/min
BATTERY VOLTAGE: 2.7741 V
BRDY-0002LV: 50 {beats}/min
FVT: 0
LV LEAD THRESHOLD: 1.375 V
RV LEAD IMPEDENCE ICD: 418 Ohm
TZAT-0001ATACH: 1
TZAT-0002ATACH: NEGATIVE
TZAT-0002ATACH: NEGATIVE
TZAT-0005SLOWVT: 88 pct
TZAT-0005SLOWVT: 91 pct
TZAT-0011SLOWVT: 10 ms
TZAT-0011SLOWVT: 10 ms
TZAT-0012ATACH: 150 ms
TZAT-0012SLOWVT: 200 ms
TZAT-0012SLOWVT: 200 ms
TZAT-0018ATACH: NEGATIVE
TZAT-0018SLOWVT: NEGATIVE
TZAT-0018SLOWVT: NEGATIVE
TZAT-0019ATACH: 6 V
TZAT-0019ATACH: 6 V
TZAT-0019FASTVT: 8 V
TZAT-0019SLOWVT: 8 V
TZAT-0019SLOWVT: 8 V
TZAT-0020ATACH: 1.5 ms
TZAT-0020FASTVT: 1.5 ms
TZON-0003ATACH: 350 ms
TZON-0003SLOWVT: 340 ms
TZON-0004SLOWVT: 32
TZON-0005SLOWVT: 12
TZST-0001ATACH: 4
TZST-0001ATACH: 5
TZST-0001FASTVT: 2
TZST-0001FASTVT: 3
TZST-0001SLOWVT: 4
TZST-0001SLOWVT: 6
TZST-0002ATACH: NEGATIVE
TZST-0002FASTVT: NEGATIVE
TZST-0002FASTVT: NEGATIVE
TZST-0002FASTVT: NEGATIVE
TZST-0003SLOWVT: 15 J
TZST-0003SLOWVT: 35 J
TZST-0003SLOWVT: 35 J
VF: 0

## 2013-01-02 NOTE — Progress Notes (Signed)
AV Opt echo with industry rep.  Changed Paced AV from 184ms to 178ms, changed sensed AV from 145ms to 174ms.  Turned off Rate adaptive AV.  Full details in PaceArt/CKZ.

## 2013-01-02 NOTE — Progress Notes (Signed)
AV Optimization Echocardiogram performed.

## 2013-01-11 ENCOUNTER — Encounter: Payer: Self-pay | Admitting: *Deleted

## 2013-01-24 ENCOUNTER — Other Ambulatory Visit: Payer: Self-pay

## 2013-01-24 MED ORDER — CARVEDILOL 6.25 MG PO TABS: 6.2500 mg | ORAL_TABLET | Freq: Two times a day (BID) | ORAL | Status: DC

## 2013-02-07 ENCOUNTER — Encounter: Payer: Self-pay | Admitting: Cardiology

## 2013-02-07 ENCOUNTER — Ambulatory Visit (INDEPENDENT_AMBULATORY_CARE_PROVIDER_SITE_OTHER): Payer: Medicare Other | Admitting: Cardiology

## 2013-02-07 VITALS — BP 105/65 | HR 65 | Ht 66.0 in | Wt 167.0 lb

## 2013-02-07 DIAGNOSIS — I2589 Other forms of chronic ischemic heart disease: Secondary | ICD-10-CM

## 2013-02-07 DIAGNOSIS — I255 Ischemic cardiomyopathy: Secondary | ICD-10-CM

## 2013-02-07 DIAGNOSIS — I5023 Acute on chronic systolic (congestive) heart failure: Secondary | ICD-10-CM

## 2013-02-07 DIAGNOSIS — I2510 Atherosclerotic heart disease of native coronary artery without angina pectoris: Secondary | ICD-10-CM

## 2013-02-07 DIAGNOSIS — R0602 Shortness of breath: Secondary | ICD-10-CM

## 2013-02-07 NOTE — Progress Notes (Signed)
HPI The patient is a lovely 79 year old white female with a history of coronary artery disease and ischemic cardiomyopathy. He was last hospitalized in November and required milrinone therapy and Lasix IV for management of this. She did have right heart catheterization at that time demonstrating elevated filling pressures. She had been followed by Dr. Aundra Dubin.  When I first saw her in May I suspected low output heart failure and I admitted her for several days of milrinone therapy. She had much improvement with this. She subsequently saw Dr. Rayann Heman and has since had AV optimization of her biventricular pacemaker. She still doesn't feel particularly well most of her problems seem to be associated with difficulty urinating. She's been seen by GYN and treated briefly with a pessary and has ongoing medical therapy and followup of this complaint. She's not having any of the acute dyspnea that she was having that she has her chronic Class III symptoms. Her weight is gradually going she thinks it might be related to eating. She's not having any new PND or orthopnea. She's not having any new edema. She's had no palpitations, presyncope or syncope.  I reviewed the hospital records.   Allergies  Allergen Reactions  . Metformin     Upset stomach  . Sulfonamide Derivatives     swelling    Current Outpatient Prescriptions  Medication Sig Dispense Refill  . ALPRAZolam (XANAX) 0.5 MG tablet Take 0.5 mg by mouth at bedtime as needed. For anxiety/sleep      . aspirin 81 MG tablet Take 81 mg by mouth daily.      . carvedilol (COREG) 6.25 MG tablet Take 1 tablet (6.25 mg total) by mouth 2 (two) times daily with a meal.  60 tablet  6  . citalopram (CELEXA) 20 MG tablet Take 20 mg by mouth daily.      . clopidogrel (PLAVIX) 75 MG tablet TAKE ONE TABLET BY MOUTH EVERY DAY WITH BREAKFAST  30 tablet  5  . estradiol (ESTRACE) 0.5 MG tablet Place 0.5 mg vaginally daily.      . fexofenadine (ALLEGRA) 180 MG tablet Take  180 mg by mouth as needed.       . hydrALAZINE (APRESOLINE) 25 MG tablet Take 1 tablet (25 mg total) by mouth 3 (three) times daily.  90 tablet  6  . HYDROcodone-acetaminophen (NORCO) 10-325 MG per tablet Take 1 tablet by mouth every 6 (six) hours as needed for pain.      Marland Kitchen insulin glargine (LANTUS) 100 UNIT/ML injection Inject 50 Units into the skin 2 (two) times daily.      . isosorbide dinitrate (ISORDIL) 20 MG tablet Take 1 tablet (20 mg total) by mouth every 8 (eight) hours.  90 tablet  6  . Linaclotide (LINZESS) 145 MCG CAPS capsule Take 145 mcg by mouth daily.      . Meth-Hyo-M Bl-Na Phos-Ph Sal (URIBEL) 118 MG CAPS Take 118 mg by mouth 4 (four) times daily as needed.      . nitroGLYCERIN (NITROSTAT) 0.4 MG SL tablet Place 1 tablet (0.4 mg total) under the tongue every 5 (five) minutes as needed for chest pain (up to 3 doses).  25 tablet  3  . ranitidine (ZANTAC) 150 MG tablet Take 1 tablet (150 mg total) by mouth 2 (two) times daily.  60 tablet  6  . rosuvastatin (CRESTOR) 5 MG tablet Take 1 tablet (5 mg total) by mouth every evening.  30 tablet  6  . spironolactone (ALDACTONE) 25 MG  tablet Take 1 tablet (25 mg total) by mouth every morning.  30 tablet  6  . torsemide (DEMADEX) 20 MG tablet Take 4 tablets (80 mg total) by mouth daily.  120 tablet  6   No current facility-administered medications for this visit.    Past Medical History  Diagnosis Date  . Type 2 diabetes mellitus   . Anxiety disorder   . GERD (gastroesophageal reflux disease)   . Hyperlipidemia     H/o GI intolerance to Lipitor  . Ischemic cardiomyopathy     s/p Medtronic BiV ICD 2006  . Systolic and diastolic CHF, chronic     echo 03/2012 EF 123456, mod diastolic dysfunction, mod MR, mod LAE, mod-severe TR, PASP 55mmHg  . Impingement syndrome of left shoulder   . Aneurysm     Apical; previously on Coumadin  . Chronic renal insufficiency     Creatinine 1.17 January 2010  . Peripheral polyneuropathy     Refractory  to several medications  . Coronary artery disease     Cath 02/15/12 revealed RCA as culprit lesion w/ occlusion at site of prior stent, patent LAD stent, chronic LCx and Diagonal dz; Medical therapy   . Depression     Past Surgical History  Procedure Laterality Date  . Abdominal hysterectomy    . Cardiac defibrillator placement  05/28/2009    Medtronic ICD placed by Dr. Caryl Comes secondary to ICM/CHF, [phone] Fidelis lead fracture with ICD storm 2010 requiring new RV lead placement  . Insert / replace / remove pacemaker    . Cholecystectomy    . Eye surgery      ROS:  As stated in the HPI and negative for all other systems.   PHYSICAL EXAM BP 105/65  Pulse 65  Ht 5\' 6"  (1.676 m)  Wt 167 lb (75.751 kg)  BMI 26.97 kg/m2 GENERAL:  Well appearing HEENT:  Pupils equal round and reactive, fundi not visualized, oral mucosa unremarkable NECK:  No jugular venous distention, waveform within normal limits, carotid upstroke brisk and symmetric, no bruits, no thyromegaly LYMPHATICS:  No cervical, inguinal adenopathy LUNGS:  Clear to auscultation bilaterally BACK:  No CVA tenderness CHEST:  Unremarkable, , pacemaker pocket intact HEART:  PMI not displaced or sustained,S1 and S2 within normal limits, no S3, no S4, no clicks, no rubs, no murmurs ABD:  Flat, positive bowel sounds normal in frequency in pitch, no bruits, no rebound, no guarding, no midline pulsatile mass, no hepatomegaly, no splenomegaly EXT:  2 plus pulses throughout, no edema, no cyanosis no clubbing SKIN:  No rashes no nodules NEURO:  Cranial nerves II through XII grossly intact, motor grossly intact throughout PSYCH:  Cognitively intact, oriented to person place and time   ASSESSMENT AND PLAN  Chronic systolic CHF:  I don't suspect a low output heart failure that she had previously.I will check a basic metabolic profile and BNP today. We will keep a close eye on her as her situation is quite tenuous. Hopefully she will have  significant improvement with the AV optimization she had recently.  CAD: Stable with no ischemic symptoms. Last cath showed occluded RCA and LCx, medical management. Continue statin, ASA 81. No further imaging is indicated.   CKD: a basic metabolic profile today.  Calf pain: This was evaluated recently by Dr. Aundra Dubin and she had normal ABIs.

## 2013-02-07 NOTE — Patient Instructions (Addendum)
Your physician recommends that you schedule a follow-up appointment in: 2 months. Your physician recommends that you continue on your current medications as directed. Please refer to the Current Medication list given to you today. Your physician recommends that you have lab work today at [facility] for BMET/BNP.

## 2013-02-11 ENCOUNTER — Telehealth: Payer: Self-pay | Admitting: *Deleted

## 2013-02-11 NOTE — Telephone Encounter (Signed)
Message copied by Merlene Laughter on Mon Feb 11, 2013  9:19 AM ------      Message from: Raul Del, PAMELA J      Created: Fri Feb 08, 2013  5:33 PM                   ----- Message -----         From: Minus Breeding, MD         Sent: 02/08/2013  10:22 AM           To: Ellwood Dense, RN            These labs are very good.  Call Ms. Rickerson with the results and send results to [facility] B, MD       ------

## 2013-02-11 NOTE — Telephone Encounter (Signed)
Patient informed and copy sent to PCP. 

## 2013-02-27 ENCOUNTER — Encounter (INDEPENDENT_AMBULATORY_CARE_PROVIDER_SITE_OTHER): Payer: Self-pay

## 2013-03-21 ENCOUNTER — Other Ambulatory Visit: Payer: Self-pay | Admitting: *Deleted

## 2013-03-21 MED ORDER — CLOPIDOGREL BISULFATE 75 MG PO TABS: 75.0000 mg | ORAL_TABLET | Freq: Every day | ORAL | Status: DC

## 2013-03-25 ENCOUNTER — Ambulatory Visit (INDEPENDENT_AMBULATORY_CARE_PROVIDER_SITE_OTHER): Payer: Medicare Other | Admitting: *Deleted

## 2013-03-25 DIAGNOSIS — I255 Ischemic cardiomyopathy: Secondary | ICD-10-CM

## 2013-03-25 DIAGNOSIS — Z95810 Presence of automatic (implantable) cardiac defibrillator: Secondary | ICD-10-CM

## 2013-03-25 DIAGNOSIS — I5023 Acute on chronic systolic (congestive) heart failure: Secondary | ICD-10-CM

## 2013-03-25 DIAGNOSIS — I2589 Other forms of chronic ischemic heart disease: Secondary | ICD-10-CM

## 2013-03-25 LAB — REMOTE ICD DEVICE
AL IMPEDENCE ICD: 494 Ohm
ATRIAL PACING ICD: 9.65 pct
BRDY-0003LV: 130 {beats}/min
BRDY-0004LV: 120 {beats}/min
LV LEAD IMPEDENCE ICD: 475 Ohm
PACEART VT: 0
TOT-0002: 0
TOT-0006: 20101202000000
TZAT-0001ATACH: 1
TZAT-0001ATACH: 3
TZAT-0001FASTVT: 1
TZAT-0001SLOWVT: 1
TZAT-0001SLOWVT: 2
TZAT-0002ATACH: NEGATIVE
TZAT-0002ATACH: NEGATIVE
TZAT-0002FASTVT: NEGATIVE
TZAT-0012ATACH: 150 ms
TZAT-0013SLOWVT: 2
TZAT-0013SLOWVT: 2
TZAT-0018ATACH: NEGATIVE
TZAT-0018ATACH: NEGATIVE
TZAT-0018SLOWVT: NEGATIVE
TZAT-0018SLOWVT: NEGATIVE
TZAT-0019ATACH: 6 V
TZAT-0019ATACH: 6 V
TZAT-0019FASTVT: 8 V
TZAT-0019SLOWVT: 8 V
TZAT-0019SLOWVT: 8 V
TZAT-0020SLOWVT: 1.5 ms
TZON-0004SLOWVT: 32
TZON-0004VSLOWVT: 20
TZON-0005SLOWVT: 12
TZST-0001ATACH: 5
TZST-0001ATACH: 6
TZST-0001FASTVT: 3
TZST-0001FASTVT: 4
TZST-0001FASTVT: 5
TZST-0001SLOWVT: 3
TZST-0001SLOWVT: 6
TZST-0002ATACH: NEGATIVE
TZST-0002ATACH: NEGATIVE
TZST-0002ATACH: NEGATIVE
TZST-0002FASTVT: NEGATIVE
TZST-0002FASTVT: NEGATIVE
TZST-0003SLOWVT: 25 J
TZST-0003SLOWVT: 35 J
VENTRICULAR PACING ICD: 99.89 pct

## 2013-03-27 ENCOUNTER — Encounter: Payer: Self-pay | Admitting: *Deleted

## 2013-04-04 ENCOUNTER — Encounter: Payer: Self-pay | Admitting: Internal Medicine

## 2013-04-17 ENCOUNTER — Encounter: Payer: Self-pay | Admitting: Cardiology

## 2013-04-17 ENCOUNTER — Ambulatory Visit (INDEPENDENT_AMBULATORY_CARE_PROVIDER_SITE_OTHER): Payer: Medicare Other | Admitting: Cardiology

## 2013-04-17 VITALS — BP 115/61 | HR 70 | Ht 66.0 in | Wt 171.0 lb

## 2013-04-17 DIAGNOSIS — I5023 Acute on chronic systolic (congestive) heart failure: Secondary | ICD-10-CM

## 2013-04-17 NOTE — Progress Notes (Signed)
HPI The patient is a lovely 79 year old white female with a history of coronary artery disease and ischemic cardiomyopathy. Since I last saw her she has not been hospitalized. However, she hasn't been feeling particularly well. She's had problems with sinus infection and urinary incontinence. She's had difficulty with the pessary. She thinks she has some increased edema which concentrates in her abdomen. Her weights may be up a little bit. She is not acutely ill and she was earlier this year when I hospitalized her. She's not describing new PND or orthopnea. She has chronic fatigue and dyspnea. She's not had any new chest discomfort however.  Allergies  Allergen Reactions  . Metformin     Upset stomach  . Sulfonamide Derivatives     swelling    Current Outpatient Prescriptions  Medication Sig Dispense Refill  . ALPRAZolam (XANAX) 0.5 MG tablet Take 0.5 mg by mouth at bedtime as needed. For anxiety/sleep      . aspirin 81 MG tablet Take 81 mg by mouth daily.      . carvedilol (COREG) 6.25 MG tablet Take 1 tablet (6.25 mg total) by mouth 2 (two) times daily with a meal.  60 tablet  6  . citalopram (CELEXA) 20 MG tablet Take 20 mg by mouth daily.      Marland Kitchen estradiol (ESTRACE) 0.5 MG tablet Place 0.5 mg vaginally daily.      . fluconazole (DIFLUCAN) 100 MG tablet Take 100 mg by mouth daily.      Marland Kitchen HYDROcodone-acetaminophen (NORCO) 10-325 MG per tablet Take 1 tablet by mouth every 6 (six) hours as needed for pain.      . hydrOXYzine (ATARAX/VISTARIL) 25 MG tablet Take 25 mg by mouth 3 (three) times daily as needed for itching.      . insulin glargine (LANTUS) 100 UNIT/ML injection Inject 50 Units into the skin 2 (two) times daily.      . isosorbide dinitrate (ISORDIL) 20 MG tablet Take 1 tablet (20 mg total) by mouth every 8 (eight) hours.  90 tablet  6  . nitrofurantoin (MACRODANTIN) 100 MG capsule Take 100 mg by mouth 2 (two) times daily.      Marland Kitchen spironolactone (ALDACTONE) 25 MG tablet Take 1  tablet (25 mg total) by mouth every morning.  30 tablet  6  . torsemide (DEMADEX) 20 MG tablet Take 4 tablets (80 mg total) by mouth daily.  120 tablet  6   No current facility-administered medications for this visit.    Past Medical History  Diagnosis Date  . Type 2 diabetes mellitus   . Anxiety disorder   . GERD (gastroesophageal reflux disease)   . Hyperlipidemia     H/o GI intolerance to Lipitor  . Ischemic cardiomyopathy     s/p Medtronic BiV ICD 2006  . Systolic and diastolic CHF, chronic     echo 03/2012 EF 123456, mod diastolic dysfunction, mod MR, mod LAE, mod-severe TR, PASP 36mmHg  . Impingement syndrome of left shoulder   . Aneurysm     Apical; previously on Coumadin  . Chronic renal insufficiency     Creatinine 1.17 January 2010  . Peripheral polyneuropathy     Refractory to several medications  . Coronary artery disease     Cath 02/15/12 revealed RCA as culprit lesion w/ occlusion at site of prior stent, patent LAD stent, chronic LCx and Diagonal dz; Medical therapy   . Depression     Past Surgical History  Procedure Laterality Date  . Abdominal  hysterectomy    . Cardiac defibrillator placement  05/28/2009    Medtronic ICD placed by Dr. Caryl Comes secondary to ICM/CHF, [phone] Fidelis lead fracture with ICD storm 2010 requiring new RV lead placement  . Insert / replace / remove pacemaker    . Cholecystectomy    . Eye surgery      ROS:  As stated in the HPI and negative for all other systems.   PHYSICAL EXAM BP 115/61  Pulse 70  Ht 5\' 6"  (1.676 m)  Wt 171 lb (77.565 kg)  BMI 27.61 kg/m2 GENERAL:  Well appearing HEENT:  Pupils equal round and reactive, fundi not visualized, oral mucosa unremarkable NECK:  No jugular venous distention, waveform within normal limits, carotid upstroke brisk and symmetric, no bruits, no thyromegaly LYMPHATICS:  No cervical, inguinal adenopathy LUNGS:  Clear to auscultation bilaterally BACK:  No CVA tenderness CHEST:  Unremarkable, ,  pacemaker pocket intact HEART:  PMI not displaced or sustained,S1 and S2 within normal limits, no S3, no S4, no clicks, no rubs, no murmurs ABD:  Flat, positive bowel sounds normal in frequency in pitch, no bruits, no rebound, no guarding, no midline pulsatile mass, no hepatomegaly, no splenomegaly EXT:  2 plus pulses throughout, no edema, no cyanosis no clubbing SKIN:  No rashes no nodules NEURO:  Cranial nerves II through XII grossly intact, motor grossly intact throughout PSYCH:  Cognitively intact, oriented to person place and time  EKG:  Atrial and ventricular paced rhythm, premature ectopic complex  04/17/2013   ASSESSMENT AND PLAN  Chronic systolic CHF:  She might be slightly volume overloaded. Of note she didn't want to take Zaroxolyn because it contains sulfa. I have instructed her to take an extra 20 mg of Demadex for the next 2 days. She is given written instructions to check a basic metabolic profile next week. We've reviewed salt restriction. She doesn't completely this and admits to eating Nabs.   CAD: Stable with no ischemic symptoms. Last cath showed occluded RCA and LCx, medical management. Continue statin, ASA 81. No further imaging is indicated.   CKD: a basic metabolic profile next week as above.   Calf pain:  She is not currently having pain.  I will defer pervious plans for possible ABIs.

## 2013-04-17 NOTE — Patient Instructions (Signed)
Please take extra Demadex 20 mg in the afternoon for the next 2 days. Continue all other medications as listed.  Please have blood work in 1 week (BMP)  Follow up in 3 months with Dr Percival Spanish.  You will receive a letter in the mail 2 months before you are due.  Please call us when you receive this letter to schedule your follow up appointment.

## 2013-04-24 ENCOUNTER — Encounter: Payer: Self-pay | Admitting: Cardiology

## 2013-05-06 ENCOUNTER — Telehealth: Payer: Self-pay | Admitting: Cardiology

## 2013-05-06 NOTE — Telephone Encounter (Signed)
Wants test results of labs done in October.

## 2013-05-07 NOTE — Telephone Encounter (Signed)
Potassium and sodium were okay. However, her hemoglobin A1c was 10. She needs followup with TAPPER,DAVID B, MD

## 2013-05-08 NOTE — Telephone Encounter (Signed)
Patient informed. Patient stated that Dr. Scotty Court already has results and PCP has already followed up on A1C.

## 2013-06-04 ENCOUNTER — Telehealth: Payer: Self-pay | Admitting: Cardiology

## 2013-06-04 NOTE — Telephone Encounter (Signed)
[name] called and states that effective June 27, 2013 she will be getting her oxygen from [place]. [name] is going to need a note from our office stating her oxygen level with a new prescription written. We will need to  Fax to [phone]  Phone # [phone] [name])

## 2013-06-05 NOTE — Telephone Encounter (Signed)
Informed patient to have PCP handle oxygen orders moving forward and call us back if she needed more assistance.

## 2013-06-14 ENCOUNTER — Other Ambulatory Visit: Payer: Self-pay | Admitting: Cardiology

## 2013-06-14 MED ORDER — NITROGLYCERIN 0.4 MG SL SUBL: 0.4000 mg | SUBLINGUAL_TABLET | SUBLINGUAL | Status: AC | PRN

## 2013-06-17 ENCOUNTER — Ambulatory Visit (INDEPENDENT_AMBULATORY_CARE_PROVIDER_SITE_OTHER): Payer: Medicare Other | Admitting: *Deleted

## 2013-06-17 DIAGNOSIS — Z95810 Presence of automatic (implantable) cardiac defibrillator: Secondary | ICD-10-CM

## 2013-06-17 DIAGNOSIS — I5023 Acute on chronic systolic (congestive) heart failure: Secondary | ICD-10-CM

## 2013-06-17 NOTE — Progress Notes (Signed)
EPIC Encounter for ICM Monitoring  Patient Name: [name] is a 79 y.o. female Date: 06/17/2013 Primary Care Physican: Deloria Lair, MD Primary Cardiologist: Hochrein Electrophysiologist: Allred Dry Weight: 170  lbs      Bi- V pacing: 98.2 %  In the past month, have you:  1. Gained more than 2 pounds in a day or more than 5 pounds in a week? No - weight today is 168 lbs  2. Had changes in your medications (with verification of current medications)? No changes to cardiac drugs, but was started on gabapentin about 15 days ago. She complains of dizziness with this medication and a little more shortness of breath.  3. Had more shortness of breath than is usual for you? Some increase, but patient relates to start of gabapentin. She has a call in to her PCP today for recommendations regarding symptoms associated with start of gabapentin.   4. Limited your activity because of shortness of breath? no  5. Not been able to sleep because of shortness of breath? no  6. Had increased swelling in your feet or ankles? no  7. Had symptoms of dehydration (dizziness, dry mouth, increased thirst, decreased urine output) yes, but relates to gabapentin  8. Had changes in sodium restriction? no  9. Been compliant with medication? Yes  ** Additional complaint of one episode of chest pain about 2 weeks ago after walking up stairs. She did have an episode of vomiting associated with this. She took one NTG with relief of symptoms. Advised patient to call back if recurrent symptoms or is SOB does not improve and her PCP does not feel this is related to gabapentin.  ICM trend:   Follow-up plan: ICM clinic phone appointment on 07/22/2013  Copy of note sent to patient's primary care physician, primary cardiologist, and device following physician.  Alvis Lemmings, RN, BSN 06/17/2013 1:57 PM

## 2013-06-25 ENCOUNTER — Other Ambulatory Visit: Payer: Self-pay | Admitting: Physician Assistant

## 2013-06-28 LAB — MDC_IDC_ENUM_SESS_TYPE_REMOTE
Battery Voltage: 2.64 V
Brady Statistic AP VP Percent: 7.85 %
Brady Statistic AP VS Percent: 0.08 %
Brady Statistic AS VP Percent: 91.66 %
Brady Statistic AS VS Percent: 0.41 %
Brady Statistic RA Percent Paced: 7.93 %
Brady Statistic RV Percent Paced: 99.51 %
Date Time Interrogation Session: 20141222094226
HighPow Impedance: 55 Ohm
HighPow Impedance: 65 Ohm
Lead Channel Impedance Value: 4047 Ohm
Lead Channel Impedance Value: 4047 Ohm
Lead Channel Impedance Value: 437 Ohm
Lead Channel Impedance Value: 475 Ohm
Lead Channel Impedance Value: 475 Ohm
Lead Channel Pacing Threshold Amplitude: 1.25 V
Lead Channel Pacing Threshold Pulse Width: 0.6 ms
Lead Channel Sensing Intrinsic Amplitude: 1.875 mV
Lead Channel Sensing Intrinsic Amplitude: 17.375 mV
Lead Channel Setting Pacing Amplitude: 2 V
Lead Channel Setting Pacing Amplitude: 2.5 V
Lead Channel Setting Pacing Amplitude: 3 V
Lead Channel Setting Pacing Pulse Width: 0.4 ms
Lead Channel Setting Pacing Pulse Width: 0.6 ms
Lead Channel Setting Sensing Sensitivity: 0.3 mV
Zone Setting Detection Interval: 300 ms
Zone Setting Detection Interval: 340 ms
Zone Setting Detection Interval: 350 ms
Zone Setting Detection Interval: 450 ms

## 2013-07-10 ENCOUNTER — Ambulatory Visit (INDEPENDENT_AMBULATORY_CARE_PROVIDER_SITE_OTHER): Payer: Medicare PPO | Admitting: Cardiology

## 2013-07-10 ENCOUNTER — Encounter: Payer: Self-pay | Admitting: *Deleted

## 2013-07-10 ENCOUNTER — Encounter: Payer: Self-pay | Admitting: Cardiology

## 2013-07-10 VITALS — BP 114/69 | HR 68 | Ht 67.0 in | Wt 171.4 lb

## 2013-07-10 DIAGNOSIS — I2589 Other forms of chronic ischemic heart disease: Secondary | ICD-10-CM

## 2013-07-10 DIAGNOSIS — I255 Ischemic cardiomyopathy: Secondary | ICD-10-CM

## 2013-07-10 DIAGNOSIS — E785 Hyperlipidemia, unspecified: Secondary | ICD-10-CM

## 2013-07-10 DIAGNOSIS — I2510 Atherosclerotic heart disease of native coronary artery without angina pectoris: Secondary | ICD-10-CM

## 2013-07-10 MED ORDER — TORSEMIDE 20 MG PO TABS: 80.0000 mg | ORAL_TABLET | Freq: Every morning | ORAL | Status: DC

## 2013-07-10 MED ORDER — CARVEDILOL 3.125 MG PO TABS: 3.1250 mg | ORAL_TABLET | Freq: Two times a day (BID) | ORAL | Status: DC

## 2013-07-10 NOTE — Progress Notes (Signed)
Clinical Summary Ms. Toll is a 80 y.o.female last seen by Dr Percival Spanish, this is our first visit together. She is seen for the following medical problems.   1. CAD/ICM - prior stenting, most recent cath  Showed LM 40%, LAD 50% ostial with patent stent in prox LAD, D2 90%, LCX occluded in mid vessel with left to left collaterals, RCA occluded proximally at prior stent site. Has been medically managed - echo July 2014 LVEF 40-45%,  - she has a BiV ICD followed by Dr Rayann Heman - reports some occasional DOE at 1 block, which is somewhat improved from prevoius.   2. HL -awaiting renewal for crestor support, should be restarted by the end of the week. Has been off 3-4 months.     Past Medical History  Diagnosis Date  . Type 2 diabetes mellitus   . Anxiety disorder   . GERD (gastroesophageal reflux disease)   . Hyperlipidemia     H/o GI intolerance to Lipitor  . Ischemic cardiomyopathy     s/p Medtronic BiV ICD 2006  . Systolic and diastolic CHF, chronic     echo 03/2012 EF 123456, mod diastolic dysfunction, mod MR, mod LAE, mod-severe TR, PASP 55mmHg  . Impingement syndrome of left shoulder   . Aneurysm     Apical; previously on Coumadin  . Chronic renal insufficiency     Creatinine 1.17 January 2010  . Peripheral polyneuropathy     Refractory to several medications  . Coronary artery disease     Cath 02/15/12 revealed RCA as culprit lesion w/ occlusion at site of prior stent, patent LAD stent, chronic LCx and Diagonal dz; Medical therapy   . Depression      Allergies  Allergen Reactions  . Metformin     Upset stomach  . Sulfonamide Derivatives     swelling     Current Outpatient Prescriptions  Medication Sig Dispense Refill  . ALPRAZolam (XANAX) 0.5 MG tablet Take 0.5 mg by mouth at bedtime as needed. For anxiety/sleep      . aspirin 81 MG tablet Take 81 mg by mouth daily.      . carvedilol (COREG) 6.25 MG tablet Take 1 tablet (6.25 mg total) by mouth 2 (two) times  daily with a meal.  60 tablet  6  . citalopram (CELEXA) 20 MG tablet Take 20 mg by mouth daily.      Marland Kitchen estradiol (ESTRACE) 0.5 MG tablet Place 0.5 mg vaginally daily.      . fluconazole (DIFLUCAN) 100 MG tablet Take 100 mg by mouth daily.      Marland Kitchen HYDROcodone-acetaminophen (NORCO) 10-325 MG per tablet Take 1 tablet by mouth every 6 (six) hours as needed for pain.      . hydrOXYzine (ATARAX/VISTARIL) 25 MG tablet Take 25 mg by mouth 3 (three) times daily as needed for itching.      . insulin glargine (LANTUS) 100 UNIT/ML injection Inject 50 Units into the skin 2 (two) times daily.      . isosorbide dinitrate (ISORDIL) 20 MG tablet Take 1 tablet (20 mg total) by mouth every 8 (eight) hours.  90 tablet  6  . nitrofurantoin (MACRODANTIN) 100 MG capsule Take 100 mg by mouth 2 (two) times daily.      . nitroGLYCERIN (NITROSTAT) 0.4 MG SL tablet Place 1 tablet (0.4 mg total) under the tongue every 5 (five) minutes as needed for chest pain.  25 tablet  3  . spironolactone (ALDACTONE) 25 MG tablet  TAKE ONE TABLET BY MOUTH IN THE MORNING  30 tablet  6  . torsemide (DEMADEX) 20 MG tablet Take 4 tablets (80 mg total) by mouth daily.  120 tablet  6   No current facility-administered medications for this visit.     Past Surgical History  Procedure Laterality Date  . Abdominal hysterectomy    . Cardiac defibrillator placement  05/28/2009    Medtronic ICD placed by Dr. Caryl Comes secondary to ICM/CHF, [phone] Fidelis lead fracture with ICD storm 2010 requiring new RV lead placement  . Insert / replace / remove pacemaker    . Cholecystectomy    . Eye surgery       Allergies  Allergen Reactions  . Metformin     Upset stomach  . Sulfonamide Derivatives     swelling      Family History  Problem Relation Age of Onset  . Coronary artery disease Neg Hx      Social History Ms. Polyakov reports that she quit smoking about 11 years ago. Her smoking use included Cigarettes. She has a 24 pack-year smoking  history. She has never used smokeless tobacco. Ms. Durnan reports that she does not drink alcohol.   Review of Systems CONSTITUTIONAL: generalized fatigue HEENT: Eyes: No visual loss, blurred vision, double vision or yellow sclerae.No hearing loss, sneezing, congestion, runny nose or sore throat.  SKIN: No rash or itching.  CARDIOVASCULAR: per HPI RESPIRATORY: No shortness of breath, cough or sputum.  GASTROINTESTINAL: No anorexia, nausea, vomiting or diarrhea. No abdominal pain or blood.  GENITOURINARY: No burning on urination, no polyuria NEUROLOGICAL: feet numbness, leg pain MUSCULOSKELETAL: No muscle, back pain, joint pain or stiffness.  LYMPHATICS: No enlarged nodes. No history of splenectomy.  PSYCHIATRIC: No history of depression or anxiety.  ENDOCRINOLOGIC: No reports of sweating, cold or heat intolerance. No polyuria or polydipsia.  Marland Kitchen   Physical Examination p 68 bp 114/69 Wt 171 lbs BMI 27 Gen: resting comfortably, no acute distress HEENT: no scleral icterus, pupils equal round and reactive, no palptable cervical adenopathy,  CV: RRR, no m/r/g, no JVD, no carotid bruits Resp: Clear to auscultation bilaterally GI: abdomen is soft, non-tender, non-distended, normal bowel sounds, no hepatosplenomegaly MSK: extremities are warm, no edema.  Skin: warm, no rash Neuro:  no focal deficits Psych: appropriate affect   Diagnostic Studies 01/2012 [facility] Procedural Findings:  Hemodynamics:  AO 94/56 with a mean of 72 mmHg  Coronary angiography:  Coronary dominance: right  Left mainstem: There is moderate calcified disease in the distal left main up to 40%.  Left anterior descending (LAD): There is a 50% ostial stenosis in the LAD. The stent in the proximal LAD is widely patent with diffuse 20-30% in-stent disease. The second diagonal Brantlee Hinde has a 90% stenosis at its origin.  Left circumflex (LCx): The left circumflex coronary is occluded in the mid vessel and reconstituted by  faint left to left collaterals.  Right coronary artery (RCA): The right coronary is occluded proximally at the site of prior stent.  Left ventriculography: Left ventricular angiography was not performed.  Final Conclusions:   1. Three-vessel obstructive coronary disease. The culprit lesion is the right coronary which is occluded at the site of a prior stent. The stent in the proximal LAD is patent. The left circumflex occlusion is chronic. The diagonal disease is also chronic. The patient is now 18 hours out from her presentation and her chest pain is currently resolved.  Recommendations: I would recommend continued medical therapy at  this point. We will assess her LV function by echocardiogram. I think there is little advantage at this point to try and reperfuse the right coronary given her very late presentation and improvement in her chest pain symptoms.   12/2012 Echo LVEF 40-45%, severe LVH, grade I diastolic dysfunction, mild MR, mod LAE, PASP 32  Assessment and Plan  1. CAD/ICM - NYHA III, LVEF 40-45% by most recent echo. Medically managed obstructive CAD. She has a BiV AICD.  - no current chest pain, overall stable DOE. She appears euvolemic in clinic today - describes generalized fatigue I think is more likely associated with depression, but will try decreasing her coreg and following symptoms. Asked her to discuss her depression further with her PCP at next follow up.   2. Hyperlipidemia - she stopped her crestor 3 months ago because was not able to afford. Our office was not notified, she reprorts from a cost standpoint she will be able to start later this week. Asked to please notifiy use if further issues with obtaining meds as we could try less expensive alternatives  Follow up 2 months   Arnoldo Lenis, M.D., F.A.C.C.

## 2013-07-10 NOTE — Patient Instructions (Signed)
Your physician recommends that you schedule a follow-up appointment in: 2 months with Dr. Harl Bowie. This appointment will be scheduled today before you leave.  Your physician has recommended you make the following change in your medication:  Decrease Carvedilol 3.125 MG one tablet by mouth twice daily.  Continue all other medications the same.

## 2013-07-12 ENCOUNTER — Encounter: Payer: Medicare Other | Admitting: Internal Medicine

## 2013-07-15 IMAGING — CR DG CHEST 1V PORT
1 series · 1 of 1 positions shown · non-contrast
Comparison: 04/16/2012

CLINICAL DATA: CHF

PORTABLE CHEST - 1 VIEW

[AP]
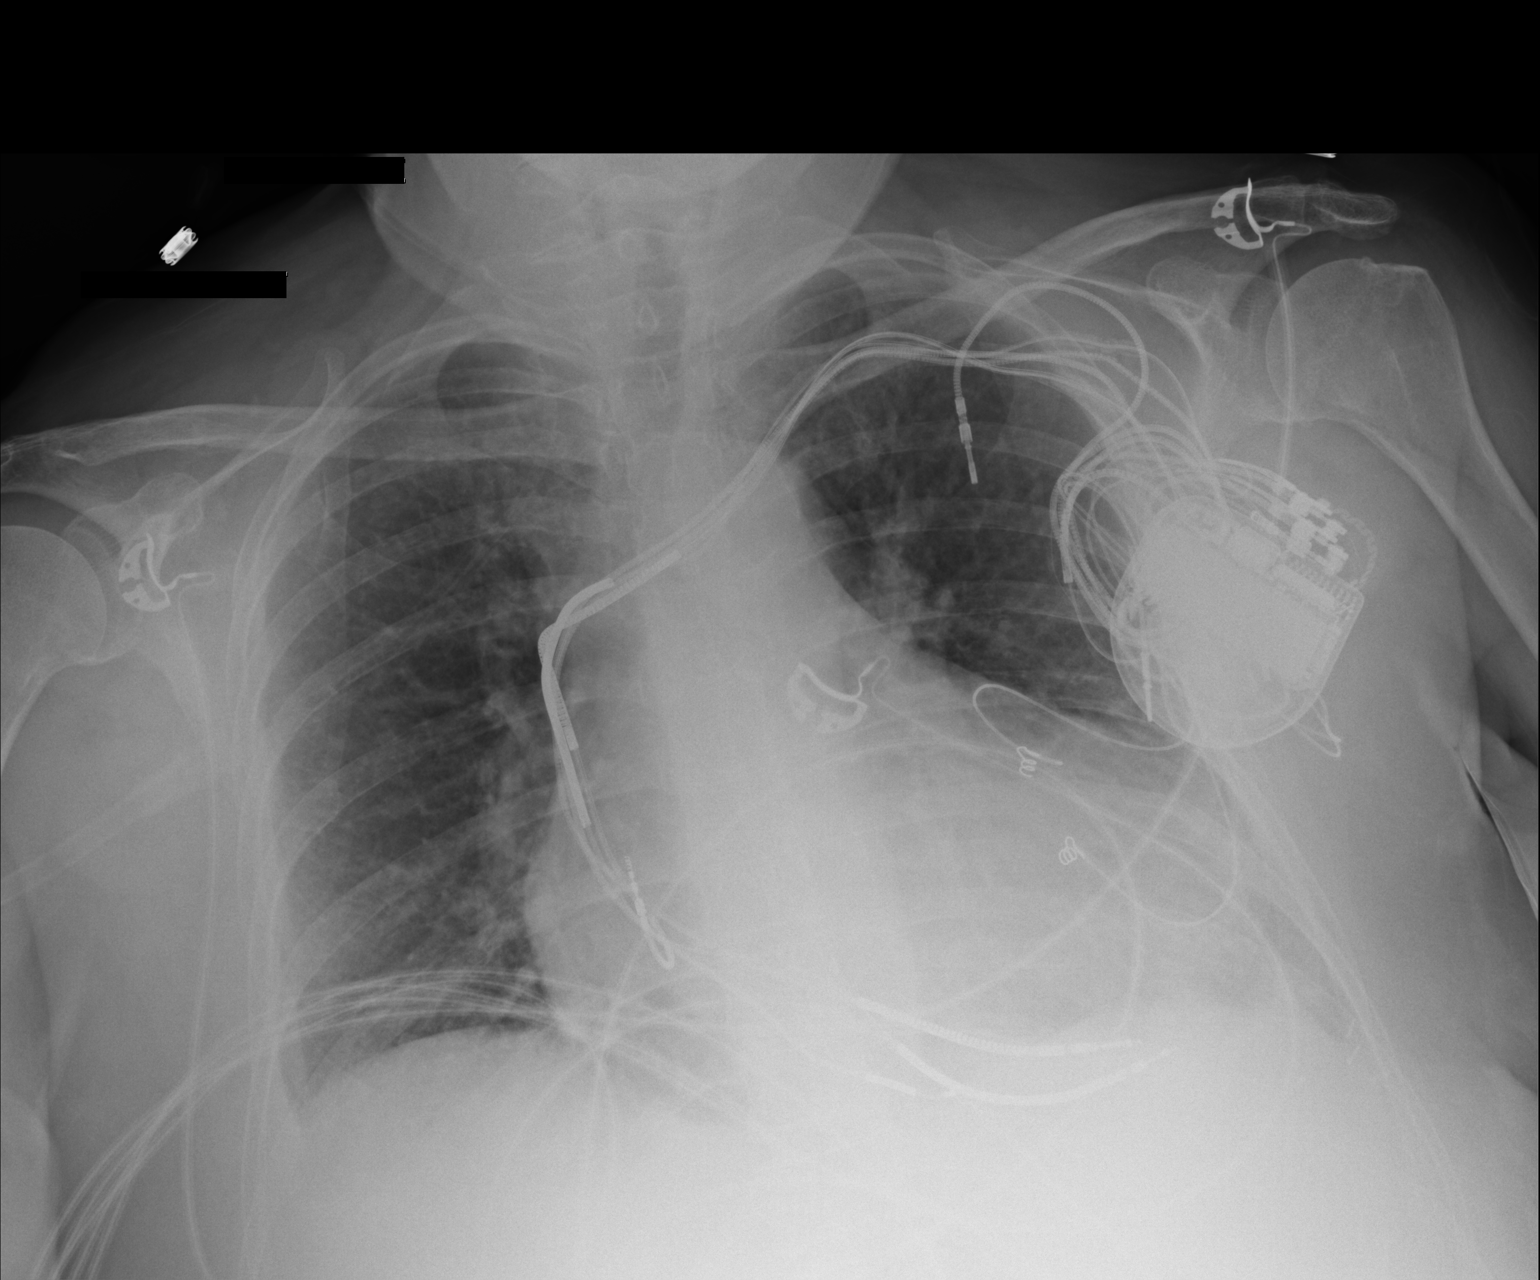

[1 of 1 positions shown; findings below may reference images not displayed]

FINDINGS: Cardiomegaly noted with improving interstitial edema.
Left subclavian AICD / pacer noted.  No enlarging effusion or
pneumothorax.
IMPRESSION: Improving interstitial edema pattern

## 2013-07-17 ENCOUNTER — Encounter: Payer: Self-pay | Admitting: Internal Medicine

## 2013-07-17 ENCOUNTER — Other Ambulatory Visit: Payer: Self-pay | Admitting: Physician Assistant

## 2013-07-22 ENCOUNTER — Ambulatory Visit (INDEPENDENT_AMBULATORY_CARE_PROVIDER_SITE_OTHER): Payer: Medicare PPO | Admitting: *Deleted

## 2013-07-22 DIAGNOSIS — Z95810 Presence of automatic (implantable) cardiac defibrillator: Secondary | ICD-10-CM

## 2013-07-22 DIAGNOSIS — I5023 Acute on chronic systolic (congestive) heart failure: Secondary | ICD-10-CM

## 2013-07-22 LAB — MDC_IDC_ENUM_SESS_TYPE_REMOTE
HighPow Impedance: 54 Ohm
Lead Channel Impedance Value: 418 Ohm
Lead Channel Impedance Value: 475 Ohm
Lead Channel Impedance Value: 494 Ohm
Lead Channel Setting Pacing Amplitude: 2 V
Lead Channel Setting Pacing Amplitude: 3 V
Lead Channel Setting Sensing Sensitivity: 0.3 mV
MDC IDC MSMT BATTERY VOLTAGE: 2.63 V
MDC IDC SET LEADCHNL LV PACING PULSEWIDTH: 0.6 ms
MDC IDC SET LEADCHNL RV PACING AMPLITUDE: 2.5 V
MDC IDC SET LEADCHNL RV PACING PULSEWIDTH: 0.4 ms
MDC IDC SET ZONE DETECTION INTERVAL: 450 ms
Zone Setting Detection Interval: 300 ms
Zone Setting Detection Interval: 340 ms
Zone Setting Detection Interval: 350 ms

## 2013-07-22 NOTE — Progress Notes (Signed)
EPIC Encounter for ICM Monitoring  Patient Name: [name] is a 80 y.o. female Date: 07/22/2013 Primary Care Physican: Deloria Lair, MD Primary Cardiologist: Branch Electrophysiologist: Allred Dry Weight: 170 lbs       In the past month, have you:  1. Gained more than 2 pounds in a day or more than 5 pounds in a week? no  2. Had changes in your medications (with verification of current medications)? Yes- Dr. Harl Bowie decreased coreg to 3.125 mg BID on 07/10/13 due to fatigue. Fluid medications are unchanged and verified.  3. Had more shortness of breath than is usual for you? no  4. Limited your activity because of shortness of breath? no  5. Not been able to sleep because of shortness of breath? no  6. Had increased swelling in your feet or ankles? no  7. Had symptoms of dehydration (dizziness, dry mouth, increased thirst, decreased urine output) no  8. Had changes in sodium restriction? no  9. Been compliant with medication? Yes   ICM trend:   Follow-up plan: ICM clinic phone appointment : 08/22/13  Copy of note sent to patient's primary care physician, primary cardiologist, and device following physician.  Alvis Lemmings, RN, BSN 07/22/2013 11:30 AM

## 2013-07-29 ENCOUNTER — Encounter: Payer: Self-pay | Admitting: *Deleted

## 2013-08-12 ENCOUNTER — Encounter: Payer: Self-pay | Admitting: Internal Medicine

## 2013-08-22 ENCOUNTER — Encounter: Payer: Medicare PPO | Admitting: *Deleted

## 2013-08-29 ENCOUNTER — Encounter: Payer: Commercial Managed Care - HMO | Admitting: *Deleted

## 2013-09-03 ENCOUNTER — Encounter: Payer: Self-pay | Admitting: *Deleted

## 2013-09-04 ENCOUNTER — Encounter: Payer: Self-pay | Admitting: Internal Medicine

## 2013-09-04 ENCOUNTER — Ambulatory Visit (INDEPENDENT_AMBULATORY_CARE_PROVIDER_SITE_OTHER): Payer: Medicare PPO | Admitting: Internal Medicine

## 2013-09-04 VITALS — BP 117/66 | HR 76 | Ht 66.0 in | Wt 171.8 lb

## 2013-09-04 DIAGNOSIS — I255 Ischemic cardiomyopathy: Secondary | ICD-10-CM

## 2013-09-04 DIAGNOSIS — I2510 Atherosclerotic heart disease of native coronary artery without angina pectoris: Secondary | ICD-10-CM

## 2013-09-04 DIAGNOSIS — I5023 Acute on chronic systolic (congestive) heart failure: Secondary | ICD-10-CM

## 2013-09-04 DIAGNOSIS — I2589 Other forms of chronic ischemic heart disease: Secondary | ICD-10-CM

## 2013-09-04 DIAGNOSIS — Z95810 Presence of automatic (implantable) cardiac defibrillator: Secondary | ICD-10-CM

## 2013-09-04 LAB — MDC_IDC_ENUM_SESS_TYPE_INCLINIC
Battery Voltage: 2.62 V
Brady Statistic AP VP Percent: 6.76 %
Brady Statistic AP VS Percent: 0.08 %
Brady Statistic AS VP Percent: 92.93 %
Brady Statistic AS VS Percent: 0.23 %
HIGH POWER IMPEDANCE MEASURED VALUE: 55 Ohm
HighPow Impedance: 69 Ohm
Lead Channel Impedance Value: 4047 Ohm
Lead Channel Impedance Value: 437 Ohm
Lead Channel Impedance Value: 494 Ohm
Lead Channel Impedance Value: 551 Ohm
Lead Channel Pacing Threshold Amplitude: 0.75 V
Lead Channel Pacing Threshold Amplitude: 1.25 V
Lead Channel Sensing Intrinsic Amplitude: 1.375 mV
Lead Channel Sensing Intrinsic Amplitude: 18.5 mV
Lead Channel Setting Pacing Amplitude: 2 V
Lead Channel Setting Pacing Pulse Width: 0.4 ms
Lead Channel Setting Pacing Pulse Width: 0.6 ms
Lead Channel Setting Sensing Sensitivity: 0.3 mV
MDC IDC MSMT LEADCHNL LV IMPEDANCE VALUE: 4047 Ohm
MDC IDC MSMT LEADCHNL LV PACING THRESHOLD PULSEWIDTH: 0.6 ms
MDC IDC MSMT LEADCHNL RA PACING THRESHOLD PULSEWIDTH: 0.6 ms
MDC IDC MSMT LEADCHNL RV PACING THRESHOLD AMPLITUDE: 1.25 V
MDC IDC MSMT LEADCHNL RV PACING THRESHOLD PULSEWIDTH: 0.4 ms
MDC IDC SESS DTM: 20150311144041
MDC IDC SET LEADCHNL LV PACING AMPLITUDE: 2.25 V
MDC IDC SET LEADCHNL RV PACING AMPLITUDE: 2.5 V
MDC IDC SET ZONE DETECTION INTERVAL: 350 ms
MDC IDC SET ZONE DETECTION INTERVAL: 450 ms
MDC IDC STAT BRADY RA PERCENT PACED: 6.84 %
MDC IDC STAT BRADY RV PERCENT PACED: 99.69 %
Zone Setting Detection Interval: 300 ms
Zone Setting Detection Interval: 340 ms

## 2013-09-04 NOTE — Progress Notes (Signed)
PCP:Deloria Lair, MD Primary Cardiologist:  Dr Harl Bowie  The patient presents today for routine electrophysiology followup.  Since last being seen in our clinic, the patient has continued to struggle with CHF.  Her SOB is stable.  She underwent echo optimization of her device 7/14 without improvement.  Today, she denies symptoms of palpitations, chest pain,  lower extremity edema, dizziness, presyncope, syncope, or neurologic sequela.  The patient feels that she is tolerating medications without difficulties and is otherwise without complaint today.   Past Medical History  Diagnosis Date  . Type 2 diabetes mellitus   . Anxiety disorder   . GERD (gastroesophageal reflux disease)   . Hyperlipidemia     H/o GI intolerance to Lipitor  . Ischemic cardiomyopathy     s/p Medtronic BiV ICD 2006  . Systolic and diastolic CHF, chronic     echo 03/2012 EF 123456, mod diastolic dysfunction, mod MR, mod LAE, mod-severe TR, PASP 80mmHg  . Impingement syndrome of left shoulder   . Aneurysm     Apical; previously on Coumadin  . Chronic renal insufficiency     Creatinine 1.17 January 2010  . Peripheral polyneuropathy     Refractory to several medications  . Coronary artery disease     Cath 02/15/12 revealed RCA as culprit lesion w/ occlusion at site of prior stent, patent LAD stent, chronic LCx and Diagonal dz; Medical therapy   . Depression    Past Surgical History  Procedure Laterality Date  . Abdominal hysterectomy    . Cardiac defibrillator placement  05/28/2009    Medtronic ICD placed by Dr. Caryl Comes secondary to ICM/CHF, [phone] Fidelis lead fracture with ICD storm 2010 requiring new RV lead placement  . Insert / replace / remove pacemaker    . Cholecystectomy    . Eye surgery      Current Outpatient Prescriptions  Medication Sig Dispense Refill  . ALPRAZolam (XANAX) 0.5 MG tablet Take 0.5 mg by mouth at bedtime as needed. For anxiety/sleep      . aspirin 81 MG tablet Take 81 mg by mouth daily.       . carvedilol (COREG) 3.125 MG tablet Take 1 tablet (3.125 mg total) by mouth 2 (two) times daily with a meal.  60 tablet  6  . citalopram (CELEXA) 20 MG tablet Take 20 mg by mouth daily.      . clopidogrel (PLAVIX) 75 MG tablet Take 75 mg by mouth daily with breakfast.      . fexofenadine (ALLEGRA) 180 MG tablet Take 180 mg by mouth as needed for allergies or rhinitis.      . hydrALAZINE (APRESOLINE) 25 MG tablet Take 25 mg by mouth 3 (three) times daily.      . hydrALAZINE (APRESOLINE) 25 MG tablet TAKE ONE TABLET BY MOUTH THREE TIMES DAILY  90 tablet  6  . HYDROcodone-acetaminophen (NORCO) 10-325 MG per tablet Take 1 tablet by mouth every 6 (six) hours as needed for pain.      Marland Kitchen insulin glargine (LANTUS) 100 UNIT/ML injection Inject 55 Units into the skin 2 (two) times daily.       . isosorbide dinitrate (ISORDIL) 20 MG tablet TAKE ONE TABLET BY MOUTH EVERY 8 HOURS  90 tablet  6  . nitroGLYCERIN (NITROSTAT) 0.4 MG SL tablet Place 1 tablet (0.4 mg total) under the tongue every 5 (five) minutes as needed for chest pain.  25 tablet  3  . ranitidine (ZANTAC) 150 MG tablet Take 150 mg by mouth  2 (two) times daily.      Marland Kitchen spironolactone (ALDACTONE) 25 MG tablet TAKE ONE TABLET BY MOUTH IN THE MORNING  30 tablet  6  . torsemide (DEMADEX) 20 MG tablet Take 4 tablets (80 mg total) by mouth every morning.  120 tablet  6   No current facility-administered medications for this visit.    Allergies  Allergen Reactions  . Metformin     Upset stomach  . Sulfonamide Derivatives     swelling    History   Social History  . Marital Status: Widowed    Spouse Name: N/A    Number of Children: N/A  . Years of Education: N/A   Occupational History  . Retired    Social History Main Topics  . Smoking status: Former Smoker -- 0.80 packs/day for 30 years    Types: Cigarettes    Quit date: 06/27/2002  . Smokeless tobacco: Never Used  . Alcohol Use: No  . Drug Use: Not on file  . Sexual Activity: Not  Currently   Other Topics Concern  . Not on file   Social History Narrative  . No narrative on file    Family History  Problem Relation Age of Onset  . Coronary artery disease Neg Hx    Physical Exam: Filed Vitals:   09/04/13 1108  BP: 117/66  Pulse: 76  Height: 5\' 6"  (1.676 m)  Weight: 171 lb 12.8 oz (77.928 kg)  SpO2: 91%    GEN- The patient is well appearing, alert and oriented x 3 today.   Head- normocephalic, atraumatic Eyes-  Sclera clear, conjunctiva pink Ears- hearing intact Oropharynx- clear Neck- supple,  Lungs- Clear to ausculation bilaterally, normal work of breathing Chest- ICD pocket is well healed Heart- Regular rate and rhythm, decreased heart sounds GI- soft, NT, ND, + BS Extremities- no clubbing, cyanosis, or edema Neuro- strength and sensation are intact  ICD interrogation- reviewed in detail today,  See PACEART report  Assessment and Plan:   1. Acute on chronic systolic dysfunction/ ischemic CM This has been a real challenge lately.  Dr Harl Bowie is following closely.  She is also followed by Alvis Lemmings in our ICM device clinic. Normal BiV function See Pace Art report No changes today She did not really improve with AV optimization. Her battery voltage today is 2.62V which is ERI (2.63V).  I will therefore schedule for pulse generator replacement in 3-4 weeks.  Risks, benefits, and alternatives to this procedure were discussed at length with the patient who wishes to proceed. We will likely place a MDT device with Adaptive CRT capability which may improve her CHF.    2. Cad Previous cath reviewed.  Medical management was advised.  I will defer any additional testing to Dr Harl Bowie at this time.

## 2013-09-04 NOTE — Patient Instructions (Signed)
Continue all current medications. Generator change - will schedule in 3-4 weeks - office will call

## 2013-09-05 ENCOUNTER — Telehealth: Payer: Self-pay | Admitting: *Deleted

## 2013-09-05 ENCOUNTER — Encounter: Payer: Self-pay | Admitting: *Deleted

## 2013-09-05 NOTE — Telephone Encounter (Signed)
I spoke with the patient to schedule her generator change for her Bi-V ICD. She is scheduled for 4/2. She will have labwork done at [facility]. Letter of instructions mailed to the patient. I have followed her in the [facility] clinic. I explained to her I will resume following her for ICM clinic 90 days post procedure. She is agreeable.

## 2013-09-10 ENCOUNTER — Other Ambulatory Visit: Payer: Self-pay | Admitting: *Deleted

## 2013-09-10 DIAGNOSIS — I509 Heart failure, unspecified: Secondary | ICD-10-CM

## 2013-09-10 DIAGNOSIS — I255 Ischemic cardiomyopathy: Secondary | ICD-10-CM

## 2013-09-15 NOTE — Progress Notes (Signed)
Clinical Summary [name] is a 80 y.o.female seen today for follow up of the following medical problems.   1. CAD/ICM  - prior stenting, most recent cath Showed LM 40%, LAD 50% ostial with patent stent in prox LAD, D2 90%, LCX occluded in mid vessel with left to left collaterals, RCA occluded proximally at prior stent site. Has been medically managed  - echo July 2014 LVEF 40-45%,  - she has a BiV ICD followed by Dr Rayann Heman, normal function by most recent check but battery at [facility], scheduled for replacement.  - ACE-I stopped in the past due to renal insufficiency per previous notes - reports some occasional DOE at 1 block, which is somewhat improved from prevoius.   - last visit decreased coreg in setting of generalized fatigue, notes energy level did improve some.   2. HL  - crestor caused joint pains, she has stopped. Lipitor caused joint pains as well in the past.     Past Medical History  Diagnosis Date  . Type 2 diabetes mellitus   . Anxiety disorder   . GERD (gastroesophageal reflux disease)   . Hyperlipidemia     H/o GI intolerance to Lipitor  . Ischemic cardiomyopathy     s/p Medtronic BiV ICD 2006  . Systolic and diastolic CHF, chronic     echo 03/2012 EF 123456, mod diastolic dysfunction, mod MR, mod LAE, mod-severe TR, PASP 46mmHg  . Impingement syndrome of left shoulder   . Aneurysm     Apical; previously on Coumadin  . Chronic renal insufficiency     Creatinine 1.17 January 2010  . Peripheral polyneuropathy     Refractory to several medications  . Coronary artery disease     Cath 02/15/12 revealed RCA as culprit lesion w/ occlusion at site of prior stent, patent LAD stent, chronic LCx and Diagonal dz; Medical therapy   . Depression      Allergies  Allergen Reactions  . Metformin     Upset stomach  . Sulfonamide Derivatives     swelling     Current Outpatient Prescriptions  Medication Sig Dispense Refill  . ALPRAZolam (XANAX) 0.5 MG tablet Take  0.5 mg by mouth at bedtime as needed. For anxiety/sleep      . aspirin 81 MG tablet Take 81 mg by mouth daily.      . carvedilol (COREG) 3.125 MG tablet Take 1 tablet (3.125 mg total) by mouth 2 (two) times daily with a meal.  60 tablet  6  . citalopram (CELEXA) 20 MG tablet Take 20 mg by mouth daily.      . clopidogrel (PLAVIX) 75 MG tablet Take 75 mg by mouth daily with breakfast.      . fexofenadine (ALLEGRA) 180 MG tablet Take 180 mg by mouth as needed for allergies or rhinitis.      . hydrALAZINE (APRESOLINE) 25 MG tablet Take 25 mg by mouth 3 (three) times daily.      . hydrALAZINE (APRESOLINE) 25 MG tablet TAKE ONE TABLET BY MOUTH THREE TIMES DAILY  90 tablet  6  . HYDROcodone-acetaminophen (NORCO) 10-325 MG per tablet Take 1 tablet by mouth every 6 (six) hours as needed for pain.      Marland Kitchen insulin glargine (LANTUS) 100 UNIT/ML injection Inject 55 Units into the skin 2 (two) times daily.       . isosorbide dinitrate (ISORDIL) 20 MG tablet TAKE ONE TABLET BY MOUTH EVERY 8 HOURS  90 tablet  6  .  nitroGLYCERIN (NITROSTAT) 0.4 MG SL tablet Place 1 tablet (0.4 mg total) under the tongue every 5 (five) minutes as needed for chest pain.  25 tablet  3  . ranitidine (ZANTAC) 150 MG tablet Take 150 mg by mouth 2 (two) times daily.      Marland Kitchen spironolactone (ALDACTONE) 25 MG tablet TAKE ONE TABLET BY MOUTH IN THE MORNING  30 tablet  6  . torsemide (DEMADEX) 20 MG tablet Take 4 tablets (80 mg total) by mouth every morning.  120 tablet  6   No current facility-administered medications for this visit.     Past Surgical History  Procedure Laterality Date  . Abdominal hysterectomy    . Cardiac defibrillator placement  05/28/2009    Medtronic ICD placed by Dr. Caryl Comes secondary to ICM/CHF, [phone] Fidelis lead fracture with ICD storm 2010 requiring new RV lead placement  . Insert / replace / remove pacemaker    . Cholecystectomy    . Eye surgery       Allergies  Allergen Reactions  . Metformin     Upset  stomach  . Sulfonamide Derivatives     swelling      Family History  Problem Relation Age of Onset  . Coronary artery disease Neg Hx      Social History [name] reports that she quit smoking about 11 years ago. Her smoking use included Cigarettes. She has a 24 pack-year smoking history. She has never used smokeless tobacco. [name] reports that she does not drink alcohol.   Review of Systems CONSTITUTIONAL: fatigue HEENT: Eyes: No visual loss, blurred vision, double vision or yellow sclerae.No hearing loss, sneezing, congestion, runny nose or sore throat.  SKIN: No rash or itching.  CARDIOVASCULAR: per HPI RESPIRATORY: No shortness of breath, cough or sputum.  GASTROINTESTINAL: No anorexia, nausea, vomiting or diarrhea. No abdominal pain or blood.  GENITOURINARY: No burning on urination, no polyuria NEUROLOGICAL: No headache, dizziness, syncope, paralysis, ataxia, numbness or tingling in the extremities. No change in bowel or bladder control.  MUSCULOSKELETAL: diffuse joint pain LYMPHATICS: No enlarged nodes. No history of splenectomy.  PSYCHIATRIC: No history of depression or anxiety.  ENDOCRINOLOGIC: No reports of sweating, cold or heat intolerance. No polyuria or polydipsia.  Marland Kitchen   Physical Examination p 77 bp 100/62 Wt 171 lbs BMI 27 Gen: resting comfortably, no acute distress HEENT: no scleral icterus, pupils equal round and reactive, no palptable cervical adenopathy,  CV: RRR, no m/r/g, no JVD, no carotid bruits Resp: Clear to auscultation bilaterally GI: abdomen is soft, non-tender, non-distended, normal bowel sounds, no hepatosplenomegaly MSK: extremities are warm, no edema.  Skin: warm, no rash Neuro:  no focal deficits Psych: appropriate affect   Diagnostic Studies 01/2012 Cath  Procedural Findings:  Hemodynamics:  AO 94/56 with a mean of 72 mmHg  Coronary angiography:  Coronary dominance: right  Left mainstem: There is moderate calcified  disease in the distal left main up to 40%.  Left anterior descending (LAD): There is a 50% ostial stenosis in the LAD. The stent in the proximal LAD is widely patent with diffuse 20-30% in-stent disease. The second diagonal branch has a 90% stenosis at its origin.  Left circumflex (LCx): The left circumflex coronary is occluded in the mid vessel and reconstituted by faint left to left collaterals.  Right coronary artery (RCA): The right coronary is occluded proximally at the site of prior stent.  Left ventriculography: Left ventricular angiography was not performed.  Final Conclusions:  1. Three-vessel  obstructive coronary disease. The culprit lesion is the right coronary which is occluded at the site of a prior stent. The stent in the proximal LAD is patent. The left circumflex occlusion is chronic. The diagonal disease is also chronic. The patient is now 18 hours out from her presentation and her chest pain is currently resolved.  Recommendations: I would recommend continued medical therapy at this point. We will assess her LV function by echocardiogram. I think there is little advantage at this point to try and reperfuse the right coronary given her very late presentation and improvement in her chest pain symptoms.   12/2012 Echo  LVEF 40-45%, severe LVH, grade I diastolic dysfunction, mild MR, mod LAE, PASP 32        Assessment and Plan  1. CAD/ICM  - NYHA III, LVEF 40-45% by most recent echo. Medically managed obstructive CAD. She has a BiV AICD.  - no current chest pain, overall stable DOE. She appears euvolemic in clinic today  - last visit decreased coreg due to fatigue, noted some improvement. Her ACE was stopped previously due to renal insufficiency, appears her renal function has been improving. She has repeat labs coming up, if renal function remains stable start low dose ACE and likely stop hydral/nitrates to allow room with her blood pressure.    2. Hyperlipidemia  - crestor and  lipitor caused joint pains, states joints still bothering her some from recent crestor use. Will consider pravastatin at next visit if she is agreeable.    Follow up 3 months    Arnoldo Lenis, M.D., F.A.C.C.

## 2013-09-16 ENCOUNTER — Encounter: Payer: Self-pay | Admitting: Cardiology

## 2013-09-16 ENCOUNTER — Ambulatory Visit (INDEPENDENT_AMBULATORY_CARE_PROVIDER_SITE_OTHER): Payer: Medicare PPO | Admitting: Cardiology

## 2013-09-16 ENCOUNTER — Encounter (HOSPITAL_COMMUNITY): Payer: Self-pay | Admitting: Pharmacy Technician

## 2013-09-16 VITALS — BP 100/62 | HR 77 | Ht 67.0 in | Wt 171.0 lb

## 2013-09-16 DIAGNOSIS — E785 Hyperlipidemia, unspecified: Secondary | ICD-10-CM

## 2013-09-16 DIAGNOSIS — I2589 Other forms of chronic ischemic heart disease: Secondary | ICD-10-CM

## 2013-09-16 DIAGNOSIS — I255 Ischemic cardiomyopathy: Secondary | ICD-10-CM

## 2013-09-16 NOTE — Patient Instructions (Signed)
Your physician recommends that you schedule a follow-up appointment in: 3 months with Dr. Harl Bowie. This appointment will be scheduled today before you leave.   Your physician recommends that you continue on your current medications as directed. Please refer to the Current Medication list given to you today.

## 2013-09-19 ENCOUNTER — Encounter: Payer: Self-pay | Admitting: Internal Medicine

## 2013-09-25 MED ORDER — GENTAMICIN SULFATE 40 MG/ML IJ SOLN
80.0000 mg | INTRAMUSCULAR | Status: AC
  Filled 2013-09-25: qty 2

## 2013-09-25 MED ORDER — SODIUM CHLORIDE 0.9 % IV SOLN
INTRAVENOUS | Status: DC
  Administered 2013-09-26: 50 mL/h via INTRAVENOUS

## 2013-09-25 MED ORDER — CEFAZOLIN SODIUM-DEXTROSE 2-3 GM-% IV SOLR
2.0000 g | INTRAVENOUS | Status: AC
  Filled 2013-09-25: qty 50

## 2013-09-26 ENCOUNTER — Ambulatory Visit (HOSPITAL_COMMUNITY)
Admission: RE | Admit: 2013-09-26 | Discharge: 2013-09-26 | Disposition: A | Discharge status: Home / Self Care | Payer: Medicare PPO | Source: Ambulatory Visit | Attending: Internal Medicine | Admitting: Internal Medicine

## 2013-09-26 ENCOUNTER — Encounter (HOSPITAL_COMMUNITY)
Admission: RE | Disposition: A | Discharge status: Home / Self Care | Payer: Self-pay | Source: Ambulatory Visit | Attending: Internal Medicine

## 2013-09-26 DIAGNOSIS — I2589 Other forms of chronic ischemic heart disease: Secondary | ICD-10-CM

## 2013-09-26 DIAGNOSIS — F3289 Other specified depressive episodes: Secondary | ICD-10-CM | POA: Insufficient documentation

## 2013-09-26 DIAGNOSIS — Z4502 Encounter for adjustment and management of automatic implantable cardiac defibrillator: Secondary | ICD-10-CM | POA: Insufficient documentation

## 2013-09-26 DIAGNOSIS — Z794 Long term (current) use of insulin: Secondary | ICD-10-CM | POA: Insufficient documentation

## 2013-09-26 DIAGNOSIS — Z9861 Coronary angioplasty status: Secondary | ICD-10-CM | POA: Insufficient documentation

## 2013-09-26 DIAGNOSIS — Z87891 Personal history of nicotine dependence: Secondary | ICD-10-CM | POA: Insufficient documentation

## 2013-09-26 DIAGNOSIS — E785 Hyperlipidemia, unspecified: Secondary | ICD-10-CM | POA: Insufficient documentation

## 2013-09-26 DIAGNOSIS — I2510 Atherosclerotic heart disease of native coronary artery without angina pectoris: Secondary | ICD-10-CM | POA: Insufficient documentation

## 2013-09-26 DIAGNOSIS — G609 Hereditary and idiopathic neuropathy, unspecified: Secondary | ICD-10-CM | POA: Insufficient documentation

## 2013-09-26 DIAGNOSIS — F329 Major depressive disorder, single episode, unspecified: Secondary | ICD-10-CM | POA: Insufficient documentation

## 2013-09-26 DIAGNOSIS — K219 Gastro-esophageal reflux disease without esophagitis: Secondary | ICD-10-CM | POA: Insufficient documentation

## 2013-09-26 DIAGNOSIS — Z7902 Long term (current) use of antithrombotics/antiplatelets: Secondary | ICD-10-CM | POA: Insufficient documentation

## 2013-09-26 DIAGNOSIS — N189 Chronic kidney disease, unspecified: Secondary | ICD-10-CM | POA: Insufficient documentation

## 2013-09-26 DIAGNOSIS — F411 Generalized anxiety disorder: Secondary | ICD-10-CM | POA: Insufficient documentation

## 2013-09-26 DIAGNOSIS — I255 Ischemic cardiomyopathy: Secondary | ICD-10-CM

## 2013-09-26 DIAGNOSIS — I5042 Chronic combined systolic (congestive) and diastolic (congestive) heart failure: Secondary | ICD-10-CM | POA: Insufficient documentation

## 2013-09-26 DIAGNOSIS — I509 Heart failure, unspecified: Secondary | ICD-10-CM | POA: Insufficient documentation

## 2013-09-26 DIAGNOSIS — Z7982 Long term (current) use of aspirin: Secondary | ICD-10-CM | POA: Insufficient documentation

## 2013-09-26 DIAGNOSIS — E119 Type 2 diabetes mellitus without complications: Secondary | ICD-10-CM | POA: Insufficient documentation

## 2013-09-26 HISTORY — PX: IMPLANTABLE CARDIOVERTER DEFIBRILLATOR GENERATOR CHANGE: SHX5859

## 2013-09-26 HISTORY — PX: IMPLANTABLE CARDIOVERTER DEFIBRILLATOR GENERATOR CHANGE: SHX5474

## 2013-09-26 LAB — CBC
HCT: 37.9 % (ref 36.0–46.0)
Hemoglobin: 12.7 g/dL (ref 12.0–15.0)
MCH: 29.7 pg (ref 26.0–34.0)
MCHC: 33.5 g/dL (ref 30.0–36.0)
MCV: 88.8 fL (ref 78.0–100.0)
Platelets: 142 10*3/uL — ABNORMAL LOW (ref 150–400)
RBC: 4.27 MIL/uL (ref 3.87–5.11)
RDW: 14.2 % (ref 11.5–15.5)
WBC: 5.9 10*3/uL (ref 4.0–10.5)

## 2013-09-26 LAB — GLUCOSE, CAPILLARY: Glucose-Capillary: 245 mg/dL — ABNORMAL HIGH (ref 70–99)

## 2013-09-26 LAB — SURGICAL PCR SCREEN
MRSA, PCR: NEGATIVE
Staphylococcus aureus: NEGATIVE

## 2013-09-26 SURGERY — IMPLANTABLE CARDIOVERTER DEFIBRILLATOR GENERATOR CHANGE
Anesthesia: LOCAL

## 2013-09-26 MED ORDER — HYDROCODONE-ACETAMINOPHEN 5-325 MG PO TABS: 1.0000 | ORAL_TABLET | ORAL | Status: DC | PRN

## 2013-09-26 MED ORDER — MUPIROCIN 2 % EX OINT
TOPICAL_OINTMENT | CUTANEOUS | Status: AC
  Administered 2013-09-26: 1
  Filled 2013-09-26: qty 22

## 2013-09-26 MED ORDER — ONDANSETRON HCL 4 MG/2ML IJ SOLN: 4.0000 mg | Freq: Four times a day (QID) | INTRAMUSCULAR | Status: DC | PRN

## 2013-09-26 MED ORDER — SODIUM CHLORIDE 0.9 % IJ SOLN: 3.0000 mL | INTRAMUSCULAR | Status: DC | PRN

## 2013-09-26 MED ORDER — SODIUM CHLORIDE 0.9 % IJ SOLN: 3.0000 mL | Freq: Two times a day (BID) | INTRAMUSCULAR | Status: DC

## 2013-09-26 MED ORDER — ACETAMINOPHEN 325 MG PO TABS: 325.0000 mg | ORAL_TABLET | ORAL | Status: DC | PRN

## 2013-09-26 MED ORDER — HYDROCODONE-ACETAMINOPHEN 5-325 MG PO TABS
2.0000 | ORAL_TABLET | Freq: Once | ORAL | Status: AC
  Administered 2013-09-26: 2 via ORAL
  Filled 2013-09-26: qty 2

## 2013-09-26 MED ORDER — CHLORHEXIDINE GLUCONATE 4 % EX LIQD
60.0000 mL | Freq: Once | CUTANEOUS | Status: DC
  Filled 2013-09-26: qty 60

## 2013-09-26 MED ORDER — MIDAZOLAM HCL 5 MG/5ML IJ SOLN
INTRAMUSCULAR | Status: AC
  Filled 2013-09-26: qty 5

## 2013-09-26 MED ORDER — SODIUM CHLORIDE 0.9 % IV SOLN: 250.0000 mL | INTRAVENOUS | Status: DC | PRN

## 2013-09-26 MED ORDER — LIDOCAINE HCL (PF) 1 % IJ SOLN
INTRAMUSCULAR | Status: AC
  Filled 2013-09-26: qty 60

## 2013-09-26 MED ORDER — FENTANYL CITRATE 0.05 MG/ML IJ SOLN
INTRAMUSCULAR | Status: AC
  Filled 2013-09-26: qty 2

## 2013-09-26 NOTE — Discharge Instructions (Signed)
° °  Supplemental Discharge Instructions following  Defibrillator Generator Change   WOUND CARE   Keep the wound area clean and dry.  Do not get this area wet for one week. No showers for one week; you may shower on 10/05/2013.   The tape/steri-strips on your wound will fall off; do not pull them off.  No bandage is needed on the site.  DO  NOT apply any creams, oils, or ointments to the wound area.   If you notice any drainage or discharge from the wound, any swelling or bruising at the site, or you develop a fever > 101? F after you are discharged home, call the office at once.  Additional information for defibrillator patients should your device go off:   If your device goes off ONCE and you feel fine afterward, notify the device clinic nurses.   If your device goes off ONCE and you do not feel well afterward, call 911.   If your device goes off TWICE, call 911.   If your device goes off THREE times in one day, call 911.  DO NOT DRIVE YOURSELF OR A FAMILY MEMBER WITH A DEFIBRILLATOR TO THE HOSPITAL--CALL 911.

## 2013-09-26 NOTE — H&P (View-Only) (Signed)
PCP:Deloria Lair, MD Primary Cardiologist:  Dr Harl Bowie  The patient presents today for routine electrophysiology followup.  Since last being seen in our clinic, the patient has continued to struggle with CHF.  Her SOB is stable.  She underwent echo optimization of her device 7/14 without improvement.  Today, she denies symptoms of palpitations, chest pain,  lower extremity edema, dizziness, presyncope, syncope, or neurologic sequela.  The patient feels that she is tolerating medications without difficulties and is otherwise without complaint today.   Past Medical History  Diagnosis Date  . Type 2 diabetes mellitus   . Anxiety disorder   . GERD (gastroesophageal reflux disease)   . Hyperlipidemia     H/o GI intolerance to Lipitor  . Ischemic cardiomyopathy     s/p Medtronic BiV ICD 2006  . Systolic and diastolic CHF, chronic     echo 03/2012 EF 123456, mod diastolic dysfunction, mod MR, mod LAE, mod-severe TR, PASP 54mmHg  . Impingement syndrome of left shoulder   . Aneurysm     Apical; previously on Coumadin  . Chronic renal insufficiency     Creatinine 1.17 January 2010  . Peripheral polyneuropathy     Refractory to several medications  . Coronary artery disease     Cath 02/15/12 revealed RCA as culprit lesion w/ occlusion at site of prior stent, patent LAD stent, chronic LCx and Diagonal dz; Medical therapy   . Depression    Past Surgical History  Procedure Laterality Date  . Abdominal hysterectomy    . Cardiac defibrillator placement  05/28/2009    Medtronic ICD placed by Dr. Caryl Comes secondary to ICM/CHF, [phone] Fidelis lead fracture with ICD storm 2010 requiring new RV lead placement  . Insert / replace / remove pacemaker    . Cholecystectomy    . Eye surgery      Current Outpatient Prescriptions  Medication Sig Dispense Refill  . ALPRAZolam (XANAX) 0.5 MG tablet Take 0.5 mg by mouth at bedtime as needed. For anxiety/sleep      . aspirin 81 MG tablet Take 81 mg by mouth daily.       . carvedilol (COREG) 3.125 MG tablet Take 1 tablet (3.125 mg total) by mouth 2 (two) times daily with a meal.  60 tablet  6  . citalopram (CELEXA) 20 MG tablet Take 20 mg by mouth daily.      . clopidogrel (PLAVIX) 75 MG tablet Take 75 mg by mouth daily with breakfast.      . fexofenadine (ALLEGRA) 180 MG tablet Take 180 mg by mouth as needed for allergies or rhinitis.      . hydrALAZINE (APRESOLINE) 25 MG tablet Take 25 mg by mouth 3 (three) times daily.      . hydrALAZINE (APRESOLINE) 25 MG tablet TAKE ONE TABLET BY MOUTH THREE TIMES DAILY  90 tablet  6  . HYDROcodone-acetaminophen (NORCO) 10-325 MG per tablet Take 1 tablet by mouth every 6 (six) hours as needed for pain.      Marland Kitchen insulin glargine (LANTUS) 100 UNIT/ML injection Inject 55 Units into the skin 2 (two) times daily.       . isosorbide dinitrate (ISORDIL) 20 MG tablet TAKE ONE TABLET BY MOUTH EVERY 8 HOURS  90 tablet  6  . nitroGLYCERIN (NITROSTAT) 0.4 MG SL tablet Place 1 tablet (0.4 mg total) under the tongue every 5 (five) minutes as needed for chest pain.  25 tablet  3  . ranitidine (ZANTAC) 150 MG tablet Take 150 mg by mouth  2 (two) times daily.      Marland Kitchen spironolactone (ALDACTONE) 25 MG tablet TAKE ONE TABLET BY MOUTH IN THE MORNING  30 tablet  6  . torsemide (DEMADEX) 20 MG tablet Take 4 tablets (80 mg total) by mouth every morning.  120 tablet  6   No current facility-administered medications for this visit.    Allergies  Allergen Reactions  . Metformin     Upset stomach  . Sulfonamide Derivatives     swelling    History   Social History  . Marital Status: Widowed    Spouse Name: N/A    Number of Children: N/A  . Years of Education: N/A   Occupational History  . Retired    Social History Main Topics  . Smoking status: Former Smoker -- 0.80 packs/day for 30 years    Types: Cigarettes    Quit date: 06/27/2002  . Smokeless tobacco: Never Used  . Alcohol Use: No  . Drug Use: Not on file  . Sexual Activity: Not  Currently   Other Topics Concern  . Not on file   Social History Narrative  . No narrative on file    Family History  Problem Relation Age of Onset  . Coronary artery disease Neg Hx    Physical Exam: Filed Vitals:   09/04/13 1108  BP: 117/66  Pulse: 76  Height: 5\' 6"  (1.676 m)  Weight: 171 lb 12.8 oz (77.928 kg)  SpO2: 91%    GEN- The patient is well appearing, alert and oriented x 3 today.   Head- normocephalic, atraumatic Eyes-  Sclera clear, conjunctiva pink Ears- hearing intact Oropharynx- clear Neck- supple,  Lungs- Clear to ausculation bilaterally, normal work of breathing Chest- ICD pocket is well healed Heart- Regular rate and rhythm, decreased heart sounds GI- soft, NT, ND, + BS Extremities- no clubbing, cyanosis, or edema Neuro- strength and sensation are intact  ICD interrogation- reviewed in detail today,  See PACEART report  Assessment and Plan:   1. Acute on chronic systolic dysfunction/ ischemic CM This has been a real challenge lately.  Dr Harl Bowie is following closely.  She is also followed by Alvis Lemmings in our ICM device clinic. Normal BiV function See Pace Art report No changes today She did not really improve with AV optimization. Her battery voltage today is 2.62V which is ERI (2.63V).  I will therefore schedule for pulse generator replacement in 3-4 weeks.  Risks, benefits, and alternatives to this procedure were discussed at length with the patient who wishes to proceed. We will likely place a MDT device with Adaptive CRT capability which may improve her CHF.    2. Cad Previous cath reviewed.  Medical management was advised.  I will defer any additional testing to Dr Harl Bowie at this time.

## 2013-09-26 NOTE — Op Note (Signed)
SURGEON:  Thompson Grayer, MD      PREPROCEDURE DIAGNOSES:   1. Ischemic cardiomyopathy.   2. New York Heart Association class II, heart failure chronically.   3. CAD  4. LBBB      POSTPROCEDURE DIAGNOSES:   1. Ischemic cardiomyopathy.   2. New York Heart Association class II, heart failure chronically.   3. CAD  4. LBBB   PROCEDURES:    1.  ICD pulse generator replacement   2. Skin pocket revision     INTRODUCTION:  [name] is a 80 y.o. female an ischemic CM (EF 40%--> previously 30% prior to CRT), NYHA Class II CHF, CAD, and prior ICD implantation who presents today for ICD pulse generator replacement for ERI battery status.       DESCRIPTION OF PROCEDURE:  Informed written consent was obtained and the patient was brought to the electrophysiology lab in the fasting state.  The patient required no sedation for the procedure today.  The patient's left chest was prepped and draped in the usual sterile fashion by the EP lab staff.  The skin overlying the left deltopectoral region was infiltrated with lidocaine for local analgesia.  A 5-cm incision was made over the existing ICD pocket.  Electrocautery was used to assure hemostasis.  The device was exposed and removed from the pocket. The device was disconnected from the leads.  The leads were examined thoroughly and their integrity confirmed to be intact.   The right atrial lead was confirmed to be a Medtronic model [id number]  (serial # [id number]) lead implanted 10/18/2004.  The right ventricular lead was confirmed to be a Harts [id number] (serial number [id number]) right ventricular defibrillator lead implanted on 05/28/2009.The left ventricular lead was confirmed to be a Medtronic model [id number] (serial number [id number]) left ventricular lead  implanted on 05/28/2009.   Atrial lead P-waves measured 1.1 mV with an impedance of 494 ohms and a threshold of 0.75 volts at 0.6 milliseconds.  The right ventricular lead R-wave measured 17  mV with impedance of 513 ohms and a threshold of 1.5 volts at 0.4 milliseconds.  Left ventricular lead impedance was 399 Ohms with a threshold of 2.0V @0 .6 sec.  All three leads were then connected to a Medtronic Viva XT CRT-D model DTBA1D1 (SN [id number]) BiV ICD. The pocket was revised to accomodate this new device.   The pocket was  irrigated with copious gentamicin solution.  The defibrillator was placed into the  Pocket.  The pocket was then closed in 2 layers with 2.0 Vicryl suture  for the subcutaneous and subcuticular layers.  Steri-Strips and a  sterile dressing were then applied.   DFT testing was not performed today.  There were no early apparent complications.     CONCLUSIONS:   1. Ischemic cardiomyopathy with chronic New York Heart Association class III heart failure, CAD, and good response to CRT previously  2. Successful BiV ICD pulse generator replacement for ERI battery status  3. No early apparent complications.   Jeneen Rinks Gae Bihl,MD 09/26/2013 4:13 PM

## 2013-09-26 NOTE — Interval H&P Note (Signed)
History and Physical Interval Note:  09/26/2013 3:39 PM  [name]  has presented today for surgery, with the diagnosis of Battery depletion  The various methods of treatment have been discussed with the patient and family. After consideration of risks, benefits and other options for treatment, the patient has consented to  Procedure(s): IMPLANTABLE CARDIOVERTER DEFIBRILLATOR GENERATOR CHANGE (N/A) as a surgical intervention .  The patient's history has been reviewed, patient examined, no change in status, stable for surgery.  I have reviewed the patient's chart and labs.  Questions were answered to the patient's satisfaction.     [name]    ICD Criteria  Current LVEF: 40% ;Obtained > 6 months ago.   NYHA Functional Classification: Class III  Heart Failure History:  Yes, Duration of heart failure since onset is > 9 months  Non-Ischemic Dilated Cardiomyopathy History:  No.  Atrial Fibrillation/Atrial Flutter:  No.  Ventricular Tachycardia History:  No.  Cardiac Arrest History:  No  History of Syndromes with Risk of Sudden Death:  No.  Previous ICD:  Yes, ICD Type:  CRT-D, Reason for ICD:  Primary prevention.  30%  Electrophysiology Study: No.  Prior MI: Yes, Most recent MI timeframe is > 40 days.  PPM: No.  OSA:  No  Patient Life Expectancy of >=1 year: Yes.  Anticoagulation Therapy:  Patient is NOT on anticoagulation therapy.   Beta Blocker Therapy:  Yes.   Ace Inhibitor/ARB Therapy:  No, Reason not on Ace Inhibitor/ARB therapy:  renal failure

## 2013-10-03 ENCOUNTER — Encounter: Payer: Self-pay | Admitting: Internal Medicine

## 2013-10-03 ENCOUNTER — Ambulatory Visit (INDEPENDENT_AMBULATORY_CARE_PROVIDER_SITE_OTHER): Payer: Medicare PPO | Admitting: *Deleted

## 2013-10-03 DIAGNOSIS — I2589 Other forms of chronic ischemic heart disease: Secondary | ICD-10-CM

## 2013-10-03 DIAGNOSIS — I255 Ischemic cardiomyopathy: Secondary | ICD-10-CM

## 2013-10-03 LAB — MDC_IDC_ENUM_SESS_TYPE_INCLINIC
Battery Remaining Longevity: 80 mo
Brady Statistic AP VS Percent: 0.02 %
Brady Statistic AS VP Percent: 97.94 %
Brady Statistic RA Percent Paced: 0.69 %
HIGH POWER IMPEDANCE MEASURED VALUE: 71 Ohm
HighPow Impedance: 304 Ohm
HighPow Impedance: 55 Ohm
Lead Channel Impedance Value: 4047 Ohm
Lead Channel Impedance Value: 4047 Ohm
Lead Channel Impedance Value: 532 Ohm
Lead Channel Pacing Threshold Amplitude: 1 V
Lead Channel Pacing Threshold Pulse Width: 0.4 ms
Lead Channel Pacing Threshold Pulse Width: 1 ms
Lead Channel Sensing Intrinsic Amplitude: 19.25 mV
Lead Channel Sensing Intrinsic Amplitude: 2 mV
Lead Channel Setting Pacing Amplitude: 2 V
Lead Channel Setting Pacing Amplitude: 2.5 V
Lead Channel Setting Pacing Amplitude: 2.5 V
Lead Channel Setting Pacing Pulse Width: 0.4 ms
Lead Channel Setting Pacing Pulse Width: 1 ms
Lead Channel Setting Sensing Sensitivity: 0.3 mV
MDC IDC MSMT BATTERY VOLTAGE: 3.13 V
MDC IDC MSMT LEADCHNL LV IMPEDANCE VALUE: 456 Ohm
MDC IDC MSMT LEADCHNL LV PACING THRESHOLD AMPLITUDE: 1.5 V
MDC IDC MSMT LEADCHNL RA IMPEDANCE VALUE: 513 Ohm
MDC IDC MSMT LEADCHNL RA PACING THRESHOLD AMPLITUDE: 0.75 V
MDC IDC MSMT LEADCHNL RA PACING THRESHOLD PULSEWIDTH: 0.4 ms
MDC IDC SESS DTM: 20150409140335
MDC IDC SET ZONE DETECTION INTERVAL: 300 ms
MDC IDC STAT BRADY AP VP PERCENT: 0.67 %
MDC IDC STAT BRADY AS VS PERCENT: 1.37 %
MDC IDC STAT BRADY RV PERCENT PACED: 97.46 %
Zone Setting Detection Interval: 340 ms
Zone Setting Detection Interval: 350 ms
Zone Setting Detection Interval: 450 ms

## 2013-10-03 NOTE — Progress Notes (Signed)
CRT-D wound check in clinic. Battery longevity 5 years. Normal device function. No episodes recorded since last check. ROV in 3 mths w/JA in [place].

## 2013-10-04 ENCOUNTER — Telehealth: Payer: Self-pay | Admitting: *Deleted

## 2013-10-04 NOTE — Telephone Encounter (Signed)
Notes Recorded by Laurine Blazer, LPN on [id number] at [id number] AM Patient notified. Will forward to PMD.

## 2013-10-04 NOTE — Telephone Encounter (Signed)
Message copied by Laurine Blazer on Fri Oct 04, 2013 11:38 AM ------      Message from: Thompson Grayer      Created: Thu Oct 03, 2013  6:41 PM       followup w pcp for diabetes management ------

## 2013-10-09 ENCOUNTER — Ambulatory Visit: Payer: Medicare PPO

## 2013-10-14 ENCOUNTER — Telehealth: Payer: Self-pay | Admitting: Internal Medicine

## 2013-10-14 NOTE — Telephone Encounter (Signed)
Informed patient that remote transmission was received and was WNL. Patient denies CP or any other symptoms other than SOB and fatigue. I offered her an appt. for tomorrow with the device clinic at the [facility] location. Patient declined appt saying that she could not come due to transportation issues. I advised patient to call [place] office to get a sooner appt with her regular cardiologist. I also advised her to go to ER if she has worsening symptoms or is she develops CP. Patient voiced understanding.

## 2013-10-14 NOTE — Telephone Encounter (Signed)
Went to see PMD week before last.  Stated he knew all about everything going on, a lot going on in her family.  Stated PMD thought it could be stress.   Been having to use oxygen more.  Thinks her SOB has increased with activity.  Will have her try to send remote check on her device.

## 2013-10-14 NOTE — Telephone Encounter (Signed)
Mrs. Night called stating that she is so weak. States that after she has been up for an hour she has to go back to bed. Very concerned about how she has been feeling since her surgery with Dr. Rayann Heman.

## 2013-10-15 ENCOUNTER — Ambulatory Visit (INDEPENDENT_AMBULATORY_CARE_PROVIDER_SITE_OTHER): Payer: Medicare PPO | Admitting: Cardiovascular Disease

## 2013-10-15 VITALS — BP 117/72 | HR 77 | Ht 67.0 in | Wt 171.0 lb

## 2013-10-15 DIAGNOSIS — I2510 Atherosclerotic heart disease of native coronary artery without angina pectoris: Secondary | ICD-10-CM

## 2013-10-15 DIAGNOSIS — R5383 Other fatigue: Secondary | ICD-10-CM

## 2013-10-15 DIAGNOSIS — Z95810 Presence of automatic (implantable) cardiac defibrillator: Secondary | ICD-10-CM

## 2013-10-15 DIAGNOSIS — I5022 Chronic systolic (congestive) heart failure: Secondary | ICD-10-CM

## 2013-10-15 DIAGNOSIS — E785 Hyperlipidemia, unspecified: Secondary | ICD-10-CM

## 2013-10-15 DIAGNOSIS — R5381 Other malaise: Secondary | ICD-10-CM

## 2013-10-15 DIAGNOSIS — R0602 Shortness of breath: Secondary | ICD-10-CM

## 2013-10-15 DIAGNOSIS — I2589 Other forms of chronic ischemic heart disease: Secondary | ICD-10-CM

## 2013-10-15 DIAGNOSIS — I255 Ischemic cardiomyopathy: Secondary | ICD-10-CM

## 2013-10-15 MED ORDER — TORSEMIDE 20 MG PO TABS: 40.0000 mg | ORAL_TABLET | Freq: Every morning | ORAL | Status: DC

## 2013-10-15 NOTE — Progress Notes (Signed)
Patient ID: [name], female   DOB: January 12, 1934, 80 y.o.   MRN: [id number]      SUBJECTIVE: The patient is a 63 year old woman who sees my colleague, Dr. Harl Bowie, whom she saw on 3/22. She was added onto the schedule today and she has been feeling very fatigued and has felt like she has been going downhill since a relatively recent BiV ICD pulse generator replacement for ERI battery status. She has a history of obstructive coronary artery disease with prior stenting, which has been medically managed, along with an ischemic cardiomyopathy. She has not tolerated either Lipitor or Crestor in the past. Her ACE inhibitor had been held due to problems with renal insufficiency.  Her device was checked on 10/03/2013 and was found to be functioning normally. She has insulin-dependent diabetes mellitus. HbA1c was elevated at 9.4% on 09/19/2013.  She denies chest pain. She thinks her shortness of breath is about the same. She just feels "weak all over". She does admit to a lot of family stressors.   Allergies  Allergen Reactions  . Metformin     Upset stomach  . Sulfonamide Derivatives     swelling    Current Outpatient Prescriptions  Medication Sig Dispense Refill  . ALPRAZolam (XANAX) 0.5 MG tablet Take 0.5 mg by mouth at bedtime. For anxiety/sleep      . aspirin 81 MG tablet Take 81 mg by mouth daily.      . carvedilol (COREG) 3.125 MG tablet Take 3.125 mg by mouth 2 (two) times daily with a meal.      . citalopram (CELEXA) 20 MG tablet Take 20 mg by mouth daily.      . fexofenadine (ALLEGRA) 180 MG tablet Take 180 mg by mouth daily as needed for allergies or rhinitis.       . hydrALAZINE (APRESOLINE) 25 MG tablet Take 25 mg by mouth 3 (three) times daily.      Marland Kitchen HYDROcodone-acetaminophen (NORCO) 10-325 MG per tablet Take 1 tablet by mouth every 6 (six) hours as needed for pain.      Marland Kitchen insulin glargine (LANTUS) 100 UNIT/ML injection Inject 60 Units into the skin 2 (two) times  daily.       . isosorbide dinitrate (ISORDIL) 20 MG tablet Take 20 mg by mouth 3 (three) times daily.      . nitroGLYCERIN (NITROSTAT) 0.4 MG SL tablet Place 1 tablet (0.4 mg total) under the tongue every 5 (five) minutes as needed for chest pain.  25 tablet  3  . Polyethyl Glycol-Propyl Glycol (SYSTANE OP) Apply 1 drop to eye daily as needed (for dry eyes).      Marland Kitchen spironolactone (ALDACTONE) 25 MG tablet Take 25 mg by mouth every morning.      . torsemide (DEMADEX) 20 MG tablet Take 80 mg by mouth every morning.       No current facility-administered medications for this visit.    Past Medical History  Diagnosis Date  . Type 2 diabetes mellitus   . Anxiety disorder   . GERD (gastroesophageal reflux disease)   . Hyperlipidemia     H/o GI intolerance to Lipitor  . Ischemic cardiomyopathy     s/p Medtronic BiV ICD 2006  . Systolic and diastolic CHF, chronic     echo 03/2012 EF 123456, mod diastolic dysfunction, mod MR, mod LAE, mod-severe TR, PASP 23mmHg  . Impingement syndrome of left shoulder   . Aneurysm     Apical; previously on Coumadin  . Chronic  renal insufficiency     Creatinine 1.17 January 2010  . Peripheral polyneuropathy     Refractory to several medications  . Coronary artery disease     Cath 02/15/12 revealed RCA as culprit lesion w/ occlusion at site of prior stent, patent LAD stent, chronic LCx and Diagonal dz; Medical therapy   . Depression     Past Surgical History  Procedure Laterality Date  . Abdominal hysterectomy    . Cardiac defibrillator placement  05/28/2009    Medtronic ICD placed by Dr. Caryl Comes secondary to ICM/CHF, [phone] Fidelis lead fracture with ICD storm 2010 requiring new RV lead placement  . Insert / replace / remove pacemaker    . Cholecystectomy    . Eye surgery      History   Social History  . Marital Status: Widowed    Spouse Name: N/A    Number of Children: N/A  . Years of Education: N/A   Occupational History  . Retired    Social  History Main Topics  . Smoking status: Former Smoker -- 0.80 packs/day for 30 years    Types: Cigarettes    Quit date: 06/27/2002  . Smokeless tobacco: Never Used  . Alcohol Use: No  . Drug Use: Not on file  . Sexual Activity: Not Currently   Other Topics Concern  . Not on file   Social History Narrative  . No narrative on file    BP 117/72  Pulse 77  Wt 171 lbs (171 lbs on 3/22 as well).   PHYSICAL EXAM General: NAD Neck: No JVD, no thyromegaly. Lungs: Clear to auscultation bilaterally with normal respiratory effort. CV: Nondisplaced PMI.  Regular rate and rhythm, normal S1/S2, no S3/S4, no murmur. No pretibial or periankle edema.  No carotid bruit.  Normal pedal pulses.  Abdomen: Soft, nontender, no hepatosplenomegaly, no distention.  Neurologic: Alert and oriented x 3.  Psych: Normal affect. Extremities: No clubbing or cyanosis.   ECG: reviewed and available in electronic records.      ASSESSMENT AND PLAN: 1. Fatigue: Upon review of prior office visits dating back to January, her fatigue was primarily felt to be due to depression. She is currently not in heart failure. I will reduce torsemide to 40 mg and also discontinue her hydralazine to see if medications could be playing a role. 2. CAD: This appears to be symptomatically stable, on her current medication regimen which includes Coreg, nitrates, and diuretics. 3. Ischemic cardiomyopathy s/p BiV ICD generator change: Normal device function per interrogation on 10/03/13. Remains on torsemide, spironolactone, hydralazine, Coreg, and nitrates. Weight is stable at 171 pounds. I will d/c hydralazine and reduce torsemide to 40 mg daily to see if this helps to alleviate her complaints of fatigue. 4. Hyperlipidemia: Will defer the institution of pravastatin to Dr. Harl Bowie.  Dispo: f/u with Dr. Harl Bowie next week.  Kate Sable, M.D., F.A.C.C.

## 2013-10-15 NOTE — Patient Instructions (Signed)
   Decrease Torsemide to 40mg  daily   Stop Hydralazine Continue all other medications.   Follow up with Dr. Harl Bowie next week

## 2013-10-16 NOTE — Telephone Encounter (Signed)
Spoke w/pt to let know transmissions were received and all was normal. Pt was seen by DR? Yesterday and med changes were made.

## 2013-10-21 ENCOUNTER — Other Ambulatory Visit: Payer: Self-pay | Admitting: Cardiology

## 2013-10-22 ENCOUNTER — Other Ambulatory Visit: Payer: Self-pay | Admitting: Cardiology

## 2013-10-22 MED ORDER — CLOPIDOGREL BISULFATE 75 MG PO TABS: 75.0000 mg | ORAL_TABLET | Freq: Every day | ORAL | Status: DC

## 2013-10-28 ENCOUNTER — Encounter: Payer: Self-pay | Admitting: Cardiology

## 2013-10-28 ENCOUNTER — Ambulatory Visit (INDEPENDENT_AMBULATORY_CARE_PROVIDER_SITE_OTHER): Payer: Commercial Managed Care - HMO | Admitting: Cardiology

## 2013-10-28 VITALS — BP 108/64 | HR 73 | Ht 67.0 in | Wt 173.0 lb

## 2013-10-28 DIAGNOSIS — R0602 Shortness of breath: Secondary | ICD-10-CM

## 2013-10-28 DIAGNOSIS — R5383 Other fatigue: Secondary | ICD-10-CM

## 2013-10-28 DIAGNOSIS — Z79899 Other long term (current) drug therapy: Secondary | ICD-10-CM

## 2013-10-28 DIAGNOSIS — R5381 Other malaise: Secondary | ICD-10-CM

## 2013-10-28 DIAGNOSIS — I2510 Atherosclerotic heart disease of native coronary artery without angina pectoris: Secondary | ICD-10-CM

## 2013-10-28 MED ORDER — OMEPRAZOLE 20 MG PO CPDR: 20.0000 mg | DELAYED_RELEASE_CAPSULE | Freq: Every day | ORAL | Status: DC

## 2013-10-28 NOTE — Patient Instructions (Signed)
   Stop Zantac  Begin Omeprazole 20mg  daily - new sent to pharm  Continue all other medications.   Labs for CMET, TSH, CBC Office will contact with results via phone or letter.   Follow up in  3 months

## 2013-10-28 NOTE — Progress Notes (Signed)
Clinical Summary Ms. Coonrod is a 80 y.o.female seen today for follow up of the following medical problems. This is a focused visit on the following medical problems.   1. CAD/ICM  - prior stenting, most recent cath Showed LM 40%, LAD 50% ostial with patent stent in prox LAD, D2 90%, LCX occluded in mid vessel with left to left collaterals, RCA occluded proximally at prior stent site. Has been medically managed  - echo July 2014 LVEF 40-45%,  - she has a BiV ICD followed by Dr Rayann Heman, normal function by most recent check but battery at [facility], gen change 09/26/13 by Dr Rayann Heman. Normal device function 10/03/13.  - ACE-I stopped in the past due to renal insufficiency per previous notes   - seen last visit as add on with Dr Bronson Ing for generalized fatigue, seemed to be have a lot of personal stress. Thought likely related to depression, potential medication side effects. Hydralazine was stopped and torsemide decreased to 40 mg daily.  - symptoms unchanged since last visit     Past Medical History  Diagnosis Date  . Type 2 diabetes mellitus   . Anxiety disorder   . GERD (gastroesophageal reflux disease)   . Hyperlipidemia     H/o GI intolerance to Lipitor  . Ischemic cardiomyopathy     s/p Medtronic BiV ICD 2006  . Systolic and diastolic CHF, chronic     echo 03/2012 EF 123456, mod diastolic dysfunction, mod MR, mod LAE, mod-severe TR, PASP 75mmHg  . Impingement syndrome of left shoulder   . Aneurysm     Apical; previously on Coumadin  . Chronic renal insufficiency     Creatinine 1.17 January 2010  . Peripheral polyneuropathy     Refractory to several medications  . Coronary artery disease     Cath 02/15/12 revealed RCA as culprit lesion w/ occlusion at site of prior stent, patent LAD stent, chronic LCx and Diagonal dz; Medical therapy   . Depression      Allergies  Allergen Reactions  . Metformin     Upset stomach  . Sulfonamide Derivatives     swelling     Current  Outpatient Prescriptions  Medication Sig Dispense Refill  . ALPRAZolam (XANAX) 0.5 MG tablet Take 0.5 mg by mouth at bedtime. For anxiety/sleep      . aspirin 81 MG tablet Take 81 mg by mouth daily.      . carvedilol (COREG) 3.125 MG tablet Take 3.125 mg by mouth 2 (two) times daily with a meal.      . citalopram (CELEXA) 20 MG tablet Take 20 mg by mouth daily.      . clopidogrel (PLAVIX) 75 MG tablet Take 1 tablet (75 mg total) by mouth daily.  90 tablet  3  . fexofenadine (ALLEGRA) 180 MG tablet Take 180 mg by mouth daily as needed for allergies or rhinitis.       Marland Kitchen HYDROcodone-acetaminophen (NORCO) 10-325 MG per tablet Take 1 tablet by mouth every 6 (six) hours as needed for pain.      Marland Kitchen insulin glargine (LANTUS) 100 UNIT/ML injection Inject 60 Units into the skin 2 (two) times daily.       . isosorbide dinitrate (ISORDIL) 20 MG tablet Take 20 mg by mouth 3 (three) times daily.      . nitroGLYCERIN (NITROSTAT) 0.4 MG SL tablet Place 1 tablet (0.4 mg total) under the tongue every 5 (five) minutes as needed for chest pain.  25 tablet  3  . Polyethyl Glycol-Propyl Glycol (SYSTANE OP) Apply 1 drop to eye daily as needed (for dry eyes).      Marland Kitchen spironolactone (ALDACTONE) 25 MG tablet Take 25 mg by mouth every morning.      . torsemide (DEMADEX) 20 MG tablet Take 2 tablets (40 mg total) by mouth every morning.       No current facility-administered medications for this visit.     Past Surgical History  Procedure Laterality Date  . Abdominal hysterectomy    . Cardiac defibrillator placement  05/28/2009    Medtronic ICD placed by Dr. Caryl Comes secondary to ICM/CHF, [phone] Fidelis lead fracture with ICD storm 2010 requiring new RV lead placement  . Insert / replace / remove pacemaker    . Cholecystectomy    . Eye surgery       Allergies  Allergen Reactions  . Metformin     Upset stomach  . Sulfonamide Derivatives     swelling      Family History  Problem Relation Age of Onset  . Coronary  artery disease Neg Hx      Social History Ms. Brunetto reports that she quit smoking about 11 years ago. Her smoking use included Cigarettes. She has a 24 pack-year smoking history. She has never used smokeless tobacco. Ms. Moroyoqui reports that she does not drink alcohol.   Review of Systems CONSTITUTIONAL: Fatigue.  HEENT: Eyes: No visual loss, blurred vision, double vision or yellow sclerae.No hearing loss, sneezing, congestion, runny nose or sore throat.  SKIN: No rash or itching.  CARDIOVASCULAR: per HPI RESPIRATORY: No shortness of breath, cough or sputum.  GASTROINTESTINAL: No anorexia, nausea, vomiting or diarrhea. No abdominal pain or blood.  GENITOURINARY: No burning on urination, no polyuria NEUROLOGICAL: No headache, dizziness, syncope, paralysis, ataxia, numbness or tingling in the extremities. No change in bowel or bladder control.  MUSCULOSKELETAL: No muscle, back pain, joint pain or stiffness.  LYMPHATICS: No enlarged nodes. No history of splenectomy.  PSYCHIATRIC: No history of depression or anxiety.  ENDOCRINOLOGIC: No reports of sweating, cold or heat intolerance. No polyuria or polydipsia.  Marland Kitchen   Physical Examination p 73 bp 108/64 Wt 173 lbs BMI 27 Gen: resting comfortably, no acute distress HEENT: no scleral icterus, pupils equal round and reactive, no palptable cervical adenopathy,  CV: RRR, no m/r/g, no JVD, no carotid bruits Resp: Clear to auscultation bilaterally GI: abdomen is soft, non-tender, non-distended, normal bowel sounds, no hepatosplenomegaly MSK: extremities are warm, no edema.  Skin: warm, no rash Neuro:  no focal deficits Psych: appropriate affect   Diagnostic Studies  01/2012 Cath  Procedural Findings:  Hemodynamics:  AO 94/56 with a mean of 72 mmHg  Coronary angiography:  Coronary dominance: right  Left mainstem: There is moderate calcified disease in the distal left main up to 40%.  Left anterior descending (LAD): There is a  50% ostial stenosis in the LAD. The stent in the proximal LAD is widely patent with diffuse 20-30% in-stent disease. The second diagonal Adalida Garver has a 90% stenosis at its origin.  Left circumflex (LCx): The left circumflex coronary is occluded in the mid vessel and reconstituted by faint left to left collaterals.  Right coronary artery (RCA): The right coronary is occluded proximally at the site of prior stent.  Left ventriculography: Left ventricular angiography was not performed.  Final Conclusions:  1. Three-vessel obstructive coronary disease. The culprit lesion is the right coronary which is occluded at the site of a prior stent. The  stent in the proximal LAD is patent. The left circumflex occlusion is chronic. The diagonal disease is also chronic. The patient is now 18 hours out from her presentation and her chest pain is currently resolved.  Recommendations: I would recommend continued medical therapy at this point. We will assess her LV function by echocardiogram. I think there is little advantage at this point to try and reperfuse the right coronary given her very late presentation and improvement in her chest pain symptoms.   12/2012 Echo  LVEF 40-45%, severe LVH, grade I diastolic dysfunction, mild MR, mod LAE, PASP 32     Assessment and Plan  1. CAD/ICM  - NYHA III, LVEF 40-45% by most recent echo. Medically managed obstructive CAD. She has a BiV AICD.  - no current chest pain, overall stable DOE. She appears euvolemic in clinic today  - I do not see a cardiac cause of her generalized fatigue. We will send off a lab panel to see if there is any evidence of medical cause including a BMET, CBC, TSH. This very well could be depression, she has several family stressors and is somewhat tearful in clinic today. She will continue to follow with her pcp     F/u 3 months     Arnoldo Lenis, M.D., F.A.C.C.

## 2013-11-26 ENCOUNTER — Encounter: Payer: Self-pay | Admitting: *Deleted

## 2013-12-17 ENCOUNTER — Ambulatory Visit: Payer: Medicare PPO | Admitting: Cardiology

## 2014-01-24 ENCOUNTER — Other Ambulatory Visit: Payer: Self-pay | Admitting: *Deleted

## 2014-01-24 MED ORDER — SPIRONOLACTONE 25 MG PO TABS: 25.0000 mg | ORAL_TABLET | Freq: Every morning | ORAL | Status: DC

## 2014-01-27 ENCOUNTER — Encounter: Payer: Self-pay | Admitting: *Deleted

## 2014-01-28 ENCOUNTER — Encounter: Payer: Self-pay | Admitting: Cardiology

## 2014-01-28 ENCOUNTER — Ambulatory Visit (INDEPENDENT_AMBULATORY_CARE_PROVIDER_SITE_OTHER): Payer: Medicare HMO | Admitting: Cardiology

## 2014-01-28 VITALS — BP 111/66 | HR 70 | Ht 66.0 in | Wt 171.0 lb

## 2014-01-28 DIAGNOSIS — I5022 Chronic systolic (congestive) heart failure: Secondary | ICD-10-CM

## 2014-01-28 DIAGNOSIS — E785 Hyperlipidemia, unspecified: Secondary | ICD-10-CM

## 2014-01-28 DIAGNOSIS — I2510 Atherosclerotic heart disease of native coronary artery without angina pectoris: Secondary | ICD-10-CM

## 2014-01-28 NOTE — Progress Notes (Signed)
Clinical Summary [name] is a 80 y.o.female seen today for follow up of the following medical problems.   1. CAD/ICM  - prior stenting, most recent cath 01/2012 showed LM 40%, LAD 50% ostial with patent stent in prox LAD, D2 90%, LCX occluded in mid vessel with left to left collaterals, RCA occluded proximally at prior stent site. Has been medically managed  - echo July 2014 LVEF 40-45% - she has a BiV ICD followed by Dr Rayann Heman, normal device check 09/2013.  - ACE-I stopped in the past due to renal insufficiency per previous notes  - can have SOB. Denies any edema. Sleeps 2 pillow chronically - compliant with meds - symptoms unchanged since last visit   2. HL  - crestor caused joint pains, she has stopped. Lipitor caused joint pains as well in the past.  - she has not wanted to try alternative statin   Past Medical History  Diagnosis Date  . Type 2 diabetes mellitus   . Anxiety disorder   . GERD (gastroesophageal reflux disease)   . Hyperlipidemia     H/o GI intolerance to Lipitor  . Ischemic cardiomyopathy     s/p Medtronic BiV ICD 2006  . Systolic and diastolic CHF, chronic     echo 03/2012 EF 123456, mod diastolic dysfunction, mod MR, mod LAE, mod-severe TR, PASP 60mmHg  . Impingement syndrome of left shoulder   . Aneurysm     Apical; previously on Coumadin  . Chronic renal insufficiency     Creatinine 1.17 January 2010  . Peripheral polyneuropathy     Refractory to several medications  . Coronary artery disease     Cath 02/15/12 revealed RCA as culprit lesion w/ occlusion at site of prior stent, patent LAD stent, chronic LCx and Diagonal dz; Medical therapy   . Depression      Allergies  Allergen Reactions  . Metformin     Upset stomach  . Sulfonamide Derivatives     swelling     Current Outpatient Prescriptions  Medication Sig Dispense Refill  . ALPRAZolam (XANAX) 0.5 MG tablet Take 0.5 mg by mouth at bedtime. For anxiety/sleep      . aspirin 81 MG  tablet Take 81 mg by mouth daily.      . carvedilol (COREG) 3.125 MG tablet Take 3.125 mg by mouth 2 (two) times daily with a meal.      . citalopram (CELEXA) 20 MG tablet Take 20 mg by mouth daily.      . clopidogrel (PLAVIX) 75 MG tablet Take 1 tablet (75 mg total) by mouth daily.  90 tablet  3  . fexofenadine (ALLEGRA) 180 MG tablet Take 180 mg by mouth daily as needed for allergies or rhinitis.       Marland Kitchen HYDROcodone-acetaminophen (NORCO) 10-325 MG per tablet Take 1 tablet by mouth every 6 (six) hours as needed for pain.      Marland Kitchen insulin glargine (LANTUS) 100 UNIT/ML injection Inject 60 Units into the skin 2 (two) times daily.       . isosorbide dinitrate (ISORDIL) 20 MG tablet Take 20 mg by mouth 3 (three) times daily.      . nitroGLYCERIN (NITROSTAT) 0.4 MG SL tablet Place 1 tablet (0.4 mg total) under the tongue every 5 (five) minutes as needed for chest pain.  25 tablet  3  . omeprazole (PRILOSEC) 20 MG capsule Take 1 capsule (20 mg total) by mouth daily.  30 capsule  6  .  Polyethyl Glycol-Propyl Glycol (SYSTANE OP) Apply 1 drop to eye daily as needed (for dry eyes).      Marland Kitchen spironolactone (ALDACTONE) 25 MG tablet Take 1 tablet (25 mg total) by mouth every morning.  30 tablet  6  . torsemide (DEMADEX) 20 MG tablet Take 2 tablets (40 mg total) by mouth every morning.       No current facility-administered medications for this visit.     Past Surgical History  Procedure Laterality Date  . Abdominal hysterectomy    . Cardiac defibrillator placement  05/28/2009    Medtronic ICD placed by Dr. Caryl Comes secondary to ICM/CHF, [phone] Fidelis lead fracture with ICD storm 2010 requiring new RV lead placement  . Insert / replace / remove pacemaker    . Cholecystectomy    . Eye surgery       Allergies  Allergen Reactions  . Metformin     Upset stomach  . Sulfonamide Derivatives     swelling      Family History  Problem Relation Age of Onset  . Coronary artery disease Neg Hx      Social  History Ms. Willems reports that she quit smoking about 11 years ago. Her smoking use included Cigarettes. She has a 24 pack-year smoking history. She has never used smokeless tobacco. Ms. Lacosse reports that she does not drink alcohol.   Review of Systems CONSTITUTIONAL: Generalized fatigue HEENT: Eyes: No visual loss, blurred vision, double vision or yellow sclerae.No hearing loss, sneezing, congestion, runny nose or sore throat.  SKIN: No rash or itching.  CARDIOVASCULAR: per HPI RESPIRATORY: No shortness of breath, cough or sputum.  GASTROINTESTINAL: No anorexia, nausea, vomiting or diarrhea. No abdominal pain or blood.  GENITOURINARY: No burning on urination, no polyuria NEUROLOGICAL: No headache, dizziness, syncope, paralysis, ataxia, numbness or tingling in the extremities. No change in bowel or bladder control.  MUSCULOSKELETAL: No muscle, back pain, joint pain or stiffness.  LYMPHATICS: No enlarged nodes. No history of splenectomy.  PSYCHIATRIC: No history of depression or anxiety.  ENDOCRINOLOGIC: No reports of sweating, cold or heat intolerance. No polyuria or polydipsia.  Marland Kitchen   Physical Examination p 70 bp 111/66 Wt 171 lbs BMI 28 Gen: resting comfortably, no acute distress HEENT: no scleral icterus, pupils equal round and reactive, no palptable cervical adenopathy,  CV: RRR, no m/r/g, no JVD, no carotid bruits Resp: Clear to auscultation bilaterally GI: abdomen is soft, non-tender, non-distended, normal bowel sounds, no hepatosplenomegaly MSK: extremities are warm, no edema.  Skin: warm, no rash Neuro:  no focal deficits Psych: appropriate affect   Diagnostic Studies 01/2012 Cath  Procedural Findings:  Hemodynamics:  AO 94/56 with a mean of 72 mmHg  Coronary angiography:  Coronary dominance: right  Left mainstem: There is moderate calcified disease in the distal left main up to 40%.  Left anterior descending (LAD): There is a 50% ostial stenosis in the LAD.  The stent in the proximal LAD is widely patent with diffuse 20-30% in-stent disease. The second diagonal Shanavia Makela has a 90% stenosis at its origin.  Left circumflex (LCx): The left circumflex coronary is occluded in the mid vessel and reconstituted by faint left to left collaterals.  Right coronary artery (RCA): The right coronary is occluded proximally at the site of prior stent.  Left ventriculography: Left ventricular angiography was not performed.  Final Conclusions:  1. Three-vessel obstructive coronary disease. The culprit lesion is the right coronary which is occluded at the site of a prior stent. The  stent in the proximal LAD is patent. The left circumflex occlusion is chronic. The diagonal disease is also chronic. The patient is now 18 hours out from her presentation and her chest pain is currently resolved.  Recommendations: I would recommend continued medical therapy at this point. We will assess her LV function by echocardiogram. I think there is little advantage at this point to try and reperfuse the right coronary given her very late presentation and improvement in her chest pain symptoms.   12/2012 Echo  LVEF 40-45%, severe LVH, grade I diastolic dysfunction, mild MR, mod LAE, PASP 32      Assessment and Plan  1. CAD/ICM  - NYHA III, LVEF 40-45% by most recent echo. Medically managed obstructive CAD. She has a BiV AICD.  - no current chest pain, overall stable DOE. She appears euvolemic in clinic today  - I do not see a cardiac cause of her generalized fatigue. She will continue to follow with her pcp   2. Hyperlipidemia  - crestor and lipitor caused joint pains, she is not willing to try alternative statin at this time - readdress next visit   F/u 6 months   Arnoldo Lenis, M.D., F.A.C.C.

## 2014-01-28 NOTE — Patient Instructions (Signed)
Continue all current medications. Your physician wants you to follow up in: 6 months.  You will receive a reminder letter in the mail one-two months in advance.  If you don't receive a letter, please call our office to schedule the follow up appointment   

## 2014-02-13 IMAGING — CR DG CHEST 2V
2 series · 2 of 2 positions shown · non-contrast
Comparison: 04/26/12

CLINICAL DATA: CHF.

CHEST - 2 VIEW

[w chest pa]
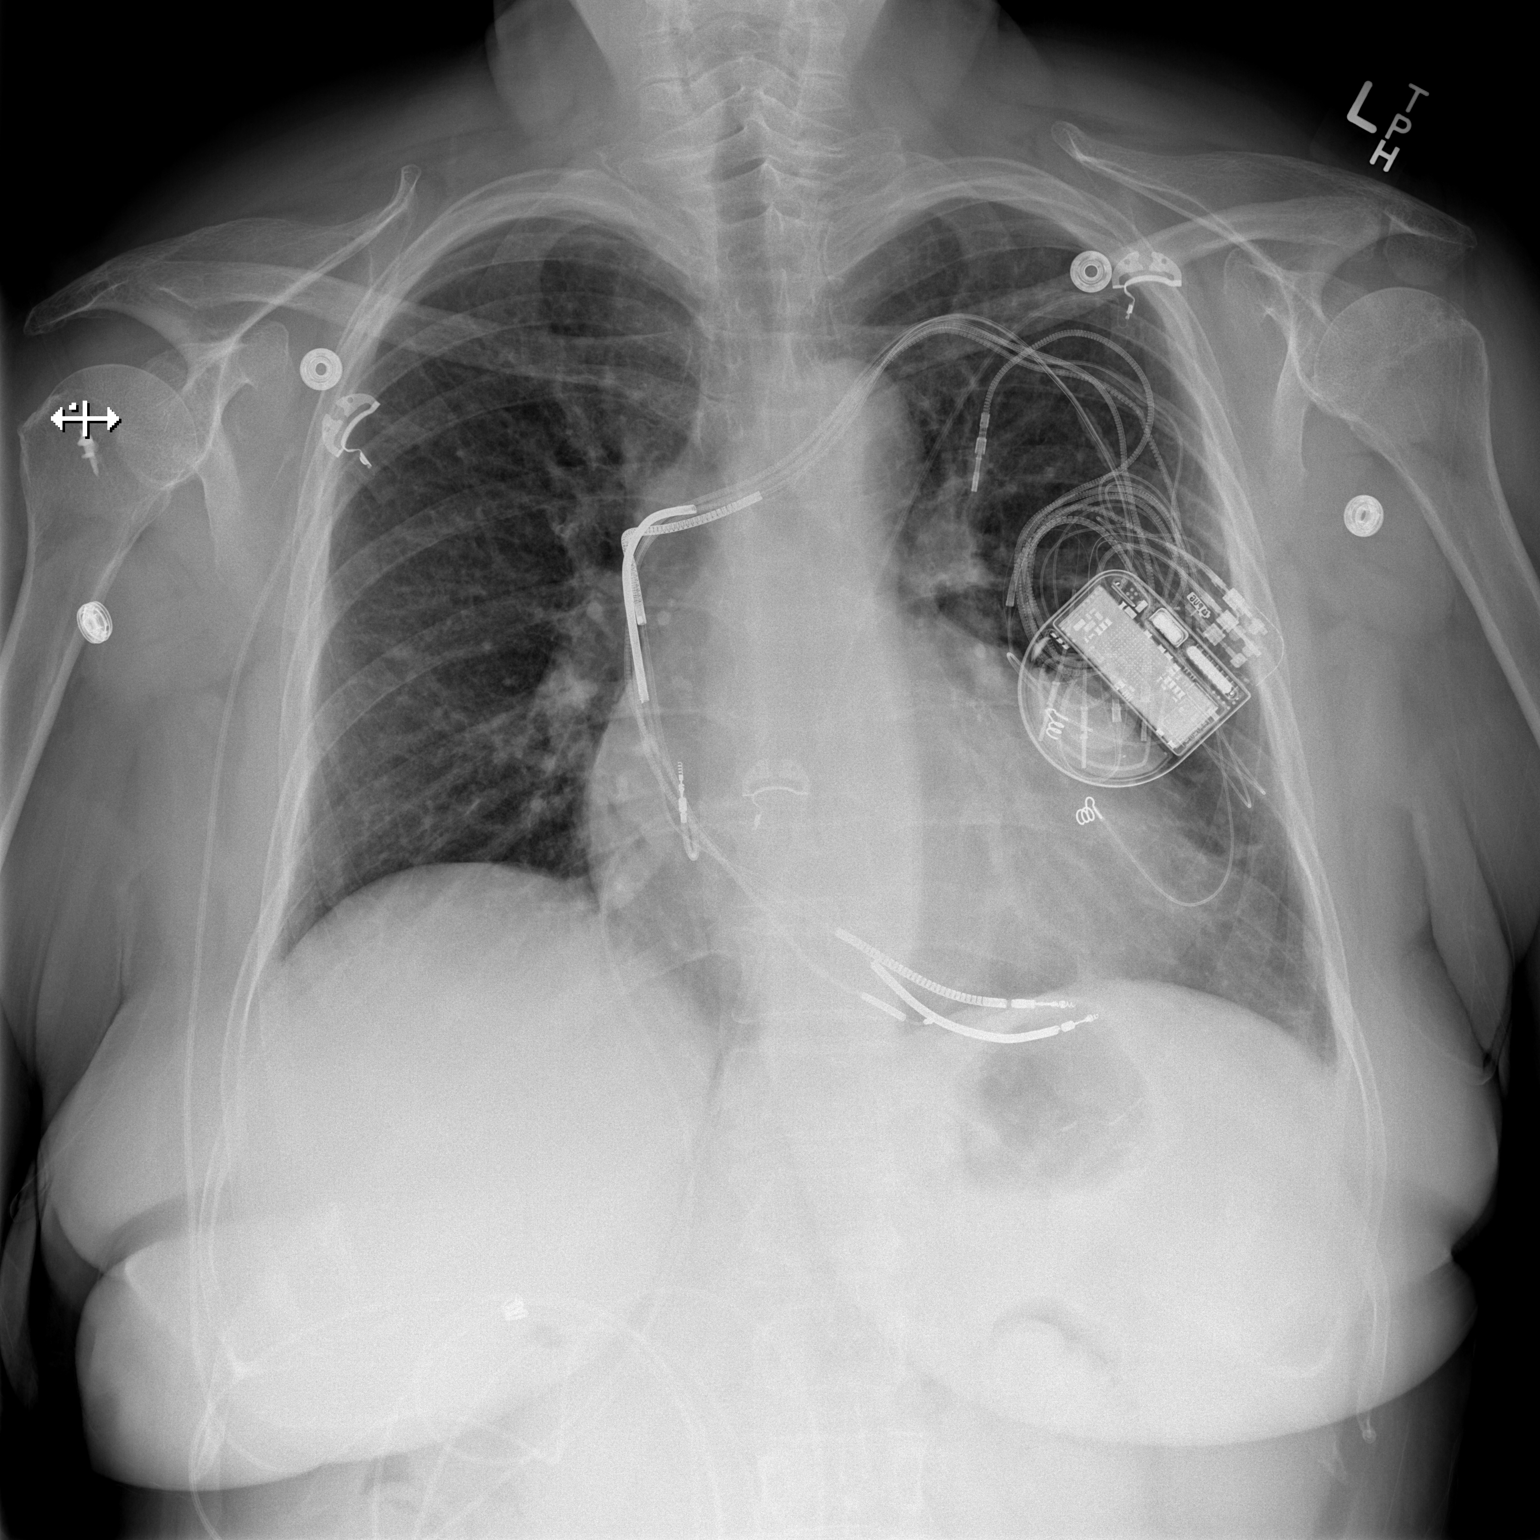

[w chest lat]
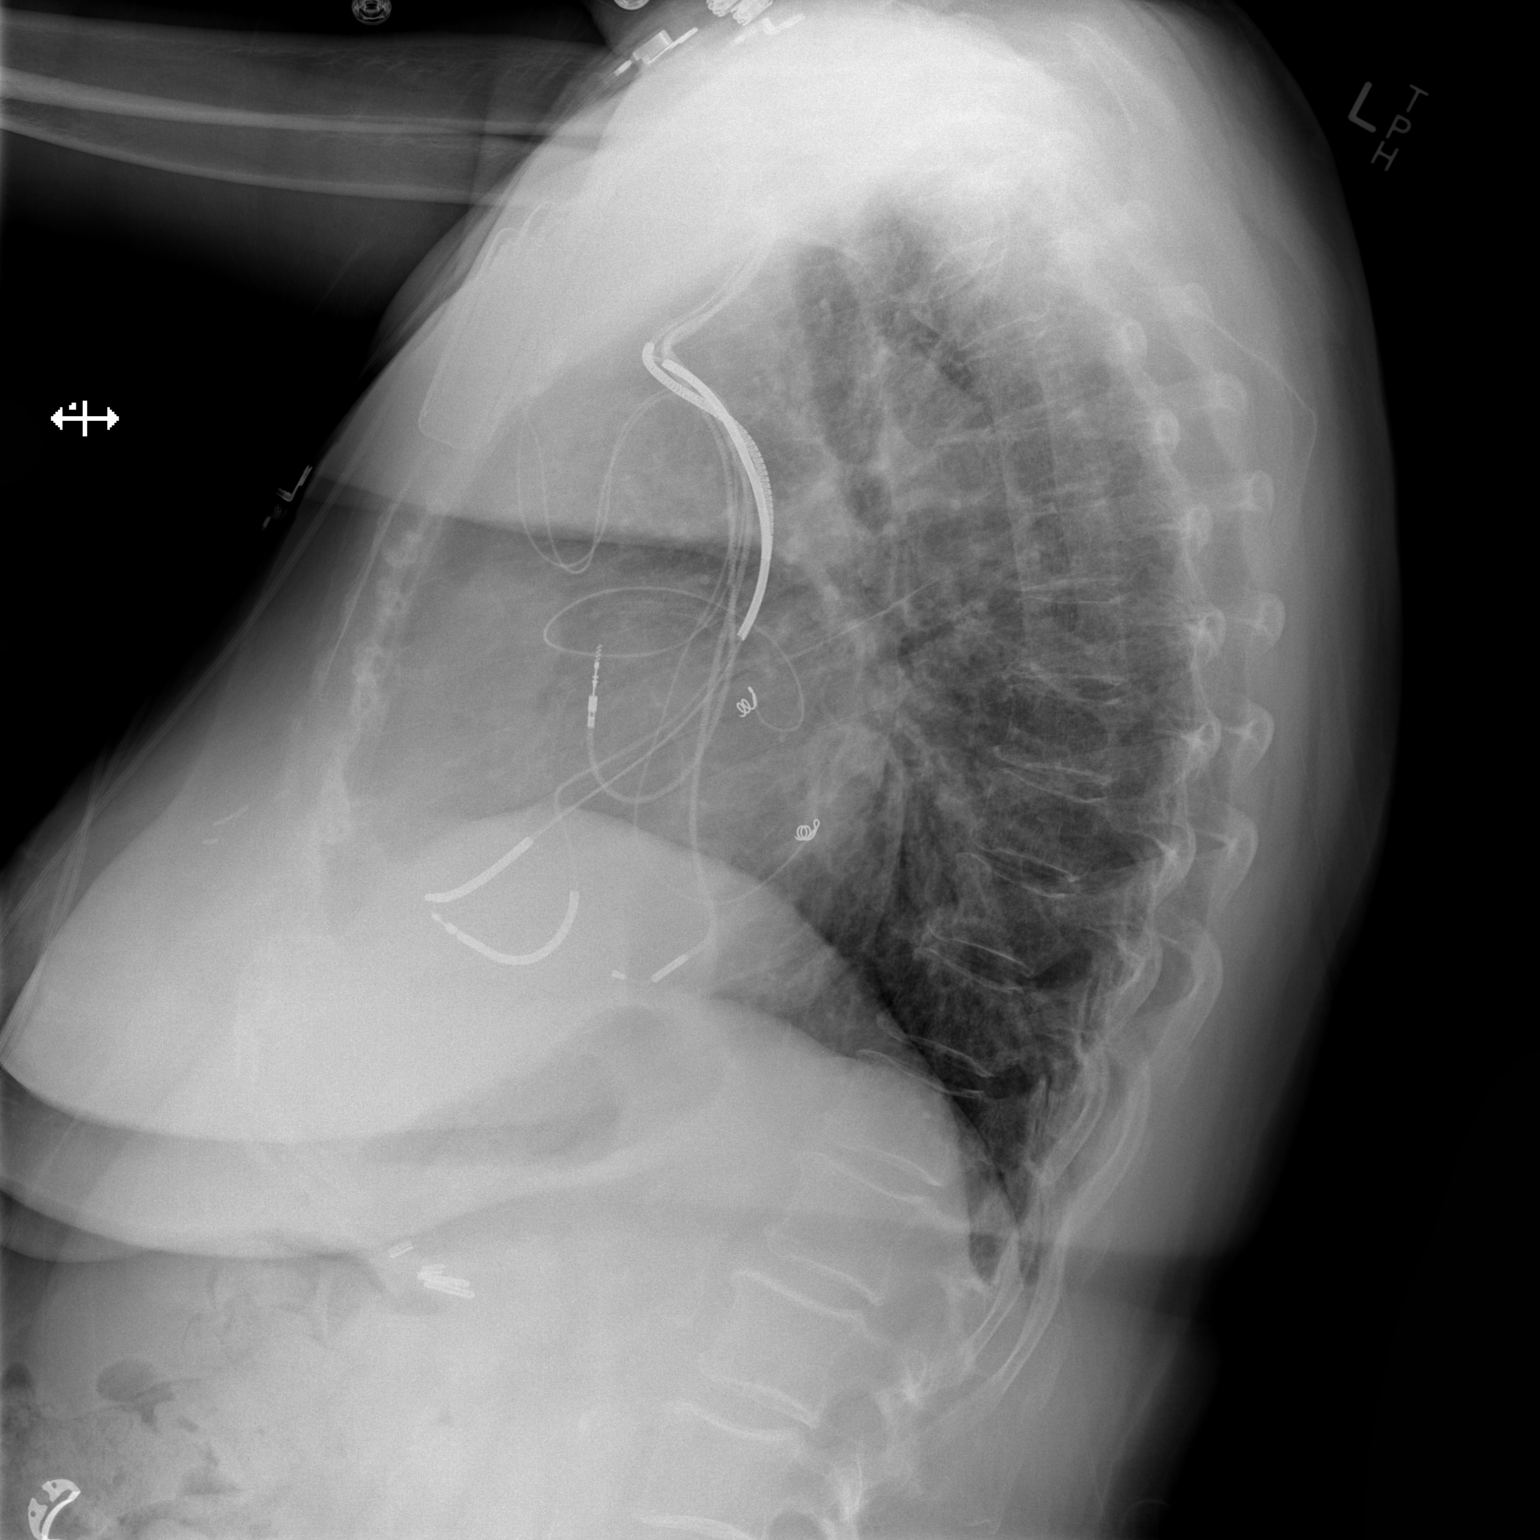

[2 of 2 positions shown; findings below may reference images not displayed]

FINDINGS: Lateral view degraded by patient arm position.

Pacer / AICD device.  There are also pacer wires which are not
attached to the battery pack.

Midline trachea.  Moderate cardiomegaly. Mediastinal contours
otherwise within normal limits.  Mild right hemidiaphragm
elevation. No pleural effusion or pneumothorax.
No congestive failure.

Mildly low lung volumes. Clear lungs.
IMPRESSION: Cardiomegaly and low lung volumes, without acute disease.

## 2014-02-21 ENCOUNTER — Ambulatory Visit (INDEPENDENT_AMBULATORY_CARE_PROVIDER_SITE_OTHER): Payer: Medicare HMO | Admitting: Internal Medicine

## 2014-02-21 ENCOUNTER — Encounter: Payer: Self-pay | Admitting: Internal Medicine

## 2014-02-21 ENCOUNTER — Telehealth: Payer: Self-pay | Admitting: Cardiology

## 2014-02-21 VITALS — BP 107/65 | HR 70 | Ht 66.0 in | Wt 168.0 lb

## 2014-02-21 DIAGNOSIS — I2589 Other forms of chronic ischemic heart disease: Secondary | ICD-10-CM

## 2014-02-21 DIAGNOSIS — I5022 Chronic systolic (congestive) heart failure: Secondary | ICD-10-CM

## 2014-02-21 DIAGNOSIS — Z95810 Presence of automatic (implantable) cardiac defibrillator: Secondary | ICD-10-CM

## 2014-02-21 DIAGNOSIS — I255 Ischemic cardiomyopathy: Secondary | ICD-10-CM

## 2014-02-21 DIAGNOSIS — I5023 Acute on chronic systolic (congestive) heart failure: Secondary | ICD-10-CM

## 2014-02-21 DIAGNOSIS — I2510 Atherosclerotic heart disease of native coronary artery without angina pectoris: Secondary | ICD-10-CM

## 2014-02-21 LAB — MDC_IDC_ENUM_SESS_TYPE_INCLINIC
Battery Voltage: 3.02 V
Brady Statistic AP VP Percent: 4.18 %
Brady Statistic AP VS Percent: 0.06 %
Brady Statistic AS VP Percent: 94.31 %
Brady Statistic RA Percent Paced: 4.24 %
Date Time Interrogation Session: 20150828135328
HIGH POWER IMPEDANCE MEASURED VALUE: 58 Ohm
HighPow Impedance: 266 Ohm
HighPow Impedance: 74 Ohm
Lead Channel Impedance Value: 4047 Ohm
Lead Channel Impedance Value: 456 Ohm
Lead Channel Impedance Value: 532 Ohm
Lead Channel Pacing Threshold Pulse Width: 0.4 ms
Lead Channel Sensing Intrinsic Amplitude: 20.25 mV
Lead Channel Setting Pacing Amplitude: 2 V
Lead Channel Setting Pacing Amplitude: 2.5 V
Lead Channel Setting Pacing Pulse Width: 1 ms
MDC IDC MSMT BATTERY REMAINING LONGEVITY: 83 mo
MDC IDC MSMT LEADCHNL LV IMPEDANCE VALUE: 4047 Ohm
MDC IDC MSMT LEADCHNL LV PACING THRESHOLD AMPLITUDE: 1 V
MDC IDC MSMT LEADCHNL LV PACING THRESHOLD PULSEWIDTH: 1 ms
MDC IDC MSMT LEADCHNL RA PACING THRESHOLD AMPLITUDE: 0.75 V
MDC IDC MSMT LEADCHNL RA PACING THRESHOLD PULSEWIDTH: 0.4 ms
MDC IDC MSMT LEADCHNL RA SENSING INTR AMPL: 1.875 mV
MDC IDC MSMT LEADCHNL RV IMPEDANCE VALUE: 532 Ohm
MDC IDC MSMT LEADCHNL RV PACING THRESHOLD AMPLITUDE: 1 V
MDC IDC SET LEADCHNL RA PACING AMPLITUDE: 2 V
MDC IDC SET LEADCHNL RV PACING PULSEWIDTH: 0.4 ms
MDC IDC SET LEADCHNL RV SENSING SENSITIVITY: 0.3 mV
MDC IDC SET ZONE DETECTION INTERVAL: 300 ms
MDC IDC STAT BRADY AS VS PERCENT: 1.45 %
MDC IDC STAT BRADY RV PERCENT PACED: 97.55 %
Zone Setting Detection Interval: 340 ms
Zone Setting Detection Interval: 350 ms
Zone Setting Detection Interval: 450 ms

## 2014-02-21 MED ORDER — ISOSORBIDE DINITRATE 20 MG PO TABS: 20.0000 mg | ORAL_TABLET | Freq: Three times a day (TID) | ORAL | Status: DC

## 2014-02-21 NOTE — Patient Instructions (Signed)
Your physician recommends that you schedule a follow-up appointment in: 1 year. You will receive a reminder letter in the mail in about 10 months reminding you to call and schedule your appointment. If you don't receive this letter, please contact our office. Carelink/device check on 05/26/14 from home. Your physician recommends that you continue on your current medications as directed. Please refer to the Current Medication list given to you today.

## 2014-02-21 NOTE — Progress Notes (Signed)
PCP:Deloria Lair, MD Primary Cardiologist:  Dr Harl Bowie  The patient presents today for routine electrophysiology followup.  Since her recent generator change, the patient has done reasonably well.  Her SOB is stable.    Today, she denies symptoms of palpitations, chest pain,  lower extremity edema, dizziness, presyncope, syncope, or neurologic sequela.  The patient feels that she is tolerating medications without difficulties and is otherwise without complaint today. She struggles with peripheral neuropathy and this is her primary concern today  Past Medical History  Diagnosis Date  . Type 2 diabetes mellitus   . Anxiety disorder   . GERD (gastroesophageal reflux disease)   . Hyperlipidemia     H/o GI intolerance to Lipitor  . Ischemic cardiomyopathy     s/p Medtronic BiV ICD 2006  . Systolic and diastolic CHF, chronic     echo 03/2012 EF 123456, mod diastolic dysfunction, mod MR, mod LAE, mod-severe TR, PASP 64mmHg  . Impingement syndrome of left shoulder   . Aneurysm     Apical; previously on Coumadin  . Chronic renal insufficiency     Creatinine 1.17 January 2010  . Peripheral polyneuropathy     Refractory to several medications  . Coronary artery disease     Cath 02/15/12 revealed RCA as culprit lesion w/ occlusion at site of prior stent, patent LAD stent, chronic LCx and Diagonal dz; Medical therapy   . Depression    Past Surgical History  Procedure Laterality Date  . Abdominal hysterectomy    . Cardiac defibrillator placement  05/28/2009    Medtronic ICD placed by Dr. Caryl Comes secondary to ICM/CHF, [phone] Fidelis lead fracture with ICD storm 2010 requiring new RV lead placement  . Implantable cardioverter defibrillator generator change  09/26/13    BiV ICD generator change by Dr Rayann Heman - [facility] [place] CRT-D  . Cholecystectomy    . Eye surgery      Current Outpatient Prescriptions  Medication Sig Dispense Refill  . ALPRAZolam (XANAX) 0.5 MG tablet Take 0.5 mg by mouth at bedtime. For  anxiety/sleep      . aspirin 81 MG tablet Take 81 mg by mouth daily.      . carvedilol (COREG) 3.125 MG tablet Take 3.125 mg by mouth 2 (two) times daily with a meal.      . citalopram (CELEXA) 20 MG tablet Take 20 mg by mouth daily.      . clopidogrel (PLAVIX) 75 MG tablet Take 1 tablet (75 mg total) by mouth daily.  90 tablet  3  . fexofenadine (ALLEGRA) 180 MG tablet Take 180 mg by mouth daily as needed for allergies or rhinitis.       Marland Kitchen HYDROmorphone (DILAUDID) 4 MG tablet Take 4 mg by mouth every 4 (four) hours as needed for severe pain.      Marland Kitchen insulin glargine (LANTUS) 100 UNIT/ML injection Inject 60 Units into the skin 2 (two) times daily.       . isosorbide dinitrate (ISORDIL) 20 MG tablet Take 20 mg by mouth 3 (three) times daily.      . nitroGLYCERIN (NITROSTAT) 0.4 MG SL tablet Place 1 tablet (0.4 mg total) under the tongue every 5 (five) minutes as needed for chest pain.  25 tablet  3  . omeprazole (PRILOSEC) 20 MG capsule Take 1 capsule (20 mg total) by mouth daily.  30 capsule  6  . OXYGEN-HELIUM IN 2 L as needed.      Vladimir Faster Glycol-Propyl Glycol (SYSTANE OP) Apply 1  drop to eye daily as needed (for dry eyes).      Marland Kitchen spironolactone (ALDACTONE) 25 MG tablet Take 1 tablet (25 mg total) by mouth every morning.  30 tablet  6  . torsemide (DEMADEX) 20 MG tablet Take 2 tablets (40 mg total) by mouth every morning.       No current facility-administered medications for this visit.    Allergies  Allergen Reactions  . Metformin     Upset stomach  . Sulfonamide Derivatives     swelling    History   Social History  . Marital Status: Widowed    Spouse Name: N/A    Number of Children: N/A  . Years of Education: N/A   Occupational History  . Retired    Social History Main Topics  . Smoking status: Former Smoker -- 0.80 packs/day for 30 years    Types: Cigarettes    Start date: 04/05/1957    Quit date: 06/27/2002  . Smokeless tobacco: Never Used  . Alcohol Use: No  .  Drug Use: Not on file  . Sexual Activity: Not Currently   Other Topics Concern  . Not on file   Social History Narrative  . No narrative on file    Family History  Problem Relation Age of Onset  . Coronary artery disease Neg Hx    Physical Exam: Filed Vitals:   02/21/14 1047  BP: 107/65  Pulse: 70  Height: 5\' 6"  (1.676 m)  Weight: 168 lb (76.204 kg)    GEN- The patient is well appearing, alert and oriented x 3 today.   Head- normocephalic, atraumatic Eyes-  Sclera clear, conjunctiva pink Ears- hearing intact Oropharynx- clear Neck- supple,  Lungs- Clear to ausculation bilaterally, normal work of breathing Chest- ICD pocket is well healed Heart- Regular rate and rhythm, decreased heart sounds GI- soft, NT, ND, + BS Extremities- no clubbing, cyanosis, or edema Neuro- strength and sensation are intact  ICD interrogation- reviewed in detail today,  See PACEART report ekg today reveals sinus with BiV pacing  Assessment and Plan:   1. Chronic systolic dysfunction/ ischemic CM Dr Harl Bowie is following closely.  She is followed by Alvis Lemmings in our ICM device clinic. Normal BiV function See Pace Art report No changes today   2. Cad Previous cath reviewed.  Medical management was advised.     carelink Return to see me in 1 year

## 2014-02-21 NOTE — Telephone Encounter (Signed)
isosorbide dinitrate (ISORDIL) 20 MG tablet Sig - Route: Take 20 mg by mouth 3 (three) times daily. - Oral Took last pill last night.  Stated that she has been trying to get filled from [place] and they told her they have contacted [place] numerous times   [place] [facility]

## 2014-02-27 ENCOUNTER — Encounter: Payer: Self-pay | Admitting: Internal Medicine

## 2014-03-24 ENCOUNTER — Encounter: Payer: Commercial Managed Care - HMO | Admitting: *Deleted

## 2014-03-24 ENCOUNTER — Telehealth: Payer: Self-pay | Admitting: Cardiology

## 2014-03-24 NOTE — Telephone Encounter (Signed)
Spoke with pt and reminded pt of remote transmission that is due today. Pt verbalized understanding.   

## 2014-03-26 ENCOUNTER — Telehealth: Payer: Self-pay | Admitting: *Deleted

## 2014-03-26 MED ORDER — CARVEDILOL 3.125 MG PO TABS: 3.1250 mg | ORAL_TABLET | Freq: Two times a day (BID) | ORAL | Status: DC

## 2014-03-26 NOTE — Telephone Encounter (Signed)
Sent to pharmacy 

## 2014-04-29 ENCOUNTER — Other Ambulatory Visit: Payer: Self-pay | Admitting: *Deleted

## 2014-04-29 MED ORDER — TORSEMIDE 20 MG PO TABS: 40.0000 mg | ORAL_TABLET | Freq: Every morning | ORAL | Status: DC

## 2014-04-29 MED ORDER — CARVEDILOL 3.125 MG PO TABS: 3.1250 mg | ORAL_TABLET | Freq: Two times a day (BID) | ORAL | Status: DC

## 2014-04-30 ENCOUNTER — Other Ambulatory Visit: Payer: Self-pay | Admitting: *Deleted

## 2014-04-30 MED ORDER — TORSEMIDE 20 MG PO TABS: 40.0000 mg | ORAL_TABLET | Freq: Every morning | ORAL | Status: DC

## 2014-05-26 ENCOUNTER — Encounter: Payer: Commercial Managed Care - HMO | Admitting: *Deleted

## 2014-05-26 ENCOUNTER — Telehealth: Payer: Self-pay | Admitting: Cardiology

## 2014-05-26 NOTE — Telephone Encounter (Signed)
Attempted to call pt and confirm remote transmission. No answer and was unable to leave message.

## 2014-05-30 ENCOUNTER — Encounter: Payer: Self-pay | Admitting: Cardiology

## 2014-06-05 ENCOUNTER — Encounter: Payer: Self-pay | Admitting: Internal Medicine

## 2014-06-05 ENCOUNTER — Telehealth: Payer: Self-pay | Admitting: General Practice

## 2014-06-05 ENCOUNTER — Ambulatory Visit (INDEPENDENT_AMBULATORY_CARE_PROVIDER_SITE_OTHER): Payer: Medicare HMO | Admitting: *Deleted

## 2014-06-05 ENCOUNTER — Encounter (HOSPITAL_COMMUNITY): Payer: Self-pay | Admitting: Cardiology

## 2014-06-05 DIAGNOSIS — I255 Ischemic cardiomyopathy: Secondary | ICD-10-CM

## 2014-06-05 LAB — MDC_IDC_ENUM_SESS_TYPE_REMOTE
Battery Remaining Longevity: 61 mo
Brady Statistic AP VP Percent: 7.27 %
Brady Statistic AP VS Percent: 0.1 %
Brady Statistic AS VP Percent: 91.14 %
Brady Statistic AS VS Percent: 1.49 %
Brady Statistic RA Percent Paced: 7.37 %
Brady Statistic RV Percent Paced: 96.83 %
HIGH POWER IMPEDANCE MEASURED VALUE: 56 Ohm
HighPow Impedance: 68 Ohm
Lead Channel Impedance Value: 4047 Ohm
Lead Channel Impedance Value: 4047 Ohm
Lead Channel Impedance Value: 456 Ohm
Lead Channel Pacing Threshold Amplitude: 0.75 V
Lead Channel Pacing Threshold Amplitude: 1.75 V
Lead Channel Pacing Threshold Pulse Width: 0.4 ms
Lead Channel Pacing Threshold Pulse Width: 0.4 ms
Lead Channel Sensing Intrinsic Amplitude: 1.625 mV
Lead Channel Sensing Intrinsic Amplitude: 20.625 mV
Lead Channel Setting Pacing Amplitude: 2 V
Lead Channel Setting Pacing Amplitude: 2.5 V
Lead Channel Setting Pacing Amplitude: 2.75 V
Lead Channel Setting Pacing Pulse Width: 0.4 ms
MDC IDC MSMT BATTERY VOLTAGE: 3 V
MDC IDC MSMT LEADCHNL LV PACING THRESHOLD PULSEWIDTH: 1 ms
MDC IDC MSMT LEADCHNL RA IMPEDANCE VALUE: 513 Ohm
MDC IDC MSMT LEADCHNL RV IMPEDANCE VALUE: 342 Ohm
MDC IDC MSMT LEADCHNL RV IMPEDANCE VALUE: 513 Ohm
MDC IDC MSMT LEADCHNL RV PACING THRESHOLD AMPLITUDE: 1.25 V
MDC IDC SESS DTM: 20151210215956
MDC IDC SET LEADCHNL LV PACING PULSEWIDTH: 1 ms
MDC IDC SET LEADCHNL RV SENSING SENSITIVITY: 0.3 mV
MDC IDC SET ZONE DETECTION INTERVAL: 340 ms
MDC IDC SET ZONE DETECTION INTERVAL: 450 ms
Zone Setting Detection Interval: 300 ms
Zone Setting Detection Interval: 350 ms

## 2014-06-05 NOTE — Telephone Encounter (Signed)
New Message      Patient called about device issues.

## 2014-06-05 NOTE — Telephone Encounter (Signed)
LMOM for return call//kwm  

## 2014-06-05 NOTE — Progress Notes (Signed)
Remote ICD transmission.   

## 2014-06-05 NOTE — Telephone Encounter (Signed)
Pt called in regards to sending transmission. Transmission not received. Pt to try and resend transmission. Pt to call back to make sure transmission was received.

## 2014-06-16 ENCOUNTER — Encounter: Payer: Self-pay | Admitting: Cardiology

## 2014-07-17 ENCOUNTER — Ambulatory Visit: Payer: Commercial Managed Care - HMO | Admitting: Cardiovascular Disease

## 2014-08-05 ENCOUNTER — Telehealth: Payer: Self-pay | Admitting: Cardiology

## 2014-08-05 NOTE — Telephone Encounter (Signed)
Pt enrolled in [facility] 1st transmission 09-08-14

## 2014-08-06 ENCOUNTER — Ambulatory Visit (INDEPENDENT_AMBULATORY_CARE_PROVIDER_SITE_OTHER): Payer: Medicare Other | Admitting: Cardiovascular Disease

## 2014-08-06 ENCOUNTER — Encounter: Payer: Self-pay | Admitting: Cardiovascular Disease

## 2014-08-06 VITALS — BP 120/60 | HR 67 | Wt 173.0 lb

## 2014-08-06 DIAGNOSIS — Z95810 Presence of automatic (implantable) cardiac defibrillator: Secondary | ICD-10-CM

## 2014-08-06 DIAGNOSIS — I2510 Atherosclerotic heart disease of native coronary artery without angina pectoris: Secondary | ICD-10-CM

## 2014-08-06 DIAGNOSIS — I5042 Chronic combined systolic (congestive) and diastolic (congestive) heart failure: Secondary | ICD-10-CM

## 2014-08-06 DIAGNOSIS — E785 Hyperlipidemia, unspecified: Secondary | ICD-10-CM

## 2014-08-06 DIAGNOSIS — I255 Ischemic cardiomyopathy: Secondary | ICD-10-CM

## 2014-08-06 DIAGNOSIS — R0602 Shortness of breath: Secondary | ICD-10-CM

## 2014-08-06 MED ORDER — FLUTICASONE-SALMETEROL 250-50 MCG/DOSE IN AEPB: 1.0000 | INHALATION_SPRAY | Freq: Two times a day (BID) | RESPIRATORY_TRACT | Status: DC

## 2014-08-06 NOTE — Progress Notes (Signed)
Patient ID: [name], female   DOB: [birth year], 81 y.o.   MRN: [id number]      SUBJECTIVE: The patient is a 81 year old woman who I am evaluating for the first time. She has a history of coronary artery disease with an ischemic cardiomyopathy and chronic systolic and diastolic heart failure.she has undergone prior stenting with her most recent coronary angiogram performed in August 2013 which showed left main 40%, LAD 50% ostial with patent stent in prox LAD, D2 90%, LCX occluded in mid vessel with left to left collaterals, RCA occluded proximally at prior stent site. She has been medically managed. She has a CRT-D system with most recent interrogation on 06/05/14 which demonstrated normal device function, no high ventricular rates, no ventricular arrhythmias, and 96% biventricular pacing. She is followed by Dr. Rayann Heman. Echocardiogram in July 2014 showed LVEF 40-45% with severe LVH and grade I diastolic dysfunction. She has a history of hyperlipidemia and developed joint pains with both Lipitor and Crestor and has not wanted to try an alternative statin. She denies chest pain and leg swelling. She has been more short of breath for the past 2 days but her weight has remained stable. She has been bothered by knee arthritis bilaterally as well.   Review of Systems: As per "subjective", otherwise negative.  Allergies  Allergen Reactions  . Metformin     Upset stomach  . Sulfonamide Derivatives     swelling    Current Outpatient Prescriptions  Medication Sig Dispense Refill  . aspirin 81 MG tablet Take 81 mg by mouth daily.    . bumetanide (BUMEX) 2 MG tablet Take 2 mg by mouth daily. 2 tabs in AM    . carvedilol (COREG) 3.125 MG tablet Take 1 tablet (3.125 mg total) by mouth 2 (two) times daily with a meal. 180 tablet 3  . citalopram (CELEXA) 20 MG tablet Take 20 mg by mouth daily.    . clopidogrel (PLAVIX) 75 MG tablet Take 1 tablet (75 mg total) by mouth daily. 90 tablet 3  .  fexofenadine (ALLEGRA) 180 MG tablet Take 180 mg by mouth daily as needed for allergies or rhinitis.     Marland Kitchen HYDROmorphone (DILAUDID) 4 MG tablet Take 4 mg by mouth every 4 (four) hours as needed for severe pain.    Marland Kitchen insulin glargine (LANTUS) 100 UNIT/ML injection Inject 60 Units into the skin 2 (two) times daily.     . nitroGLYCERIN (NITROSTAT) 0.4 MG SL tablet Place 1 tablet (0.4 mg total) under the tongue every 5 (five) minutes as needed for chest pain. 25 tablet 3  . omeprazole (PRILOSEC) 20 MG capsule Take 1 capsule (20 mg total) by mouth daily. 30 capsule 6  . OXYGEN-HELIUM IN 2 L as needed.    Vladimir Faster Glycol-Propyl Glycol (SYSTANE OP) Apply 1 drop to eye daily as needed (for dry eyes).    Marland Kitchen spironolactone (ALDACTONE) 25 MG tablet Take 1 tablet (25 mg total) by mouth every morning. 30 tablet 6  . traZODone (DESYREL) 50 MG tablet Take 50 mg by mouth at bedtime as needed for sleep.     No current facility-administered medications for this visit.    Past Medical History  Diagnosis Date  . Type 2 diabetes mellitus   . Anxiety disorder   . GERD (gastroesophageal reflux disease)   . Hyperlipidemia     H/o GI intolerance to Lipitor  . Ischemic cardiomyopathy     s/p Medtronic BiV ICD 2006  . Systolic  and diastolic CHF, chronic     echo 03/2012 EF 123456, mod diastolic dysfunction, mod MR, mod LAE, mod-severe TR, PASP 52mmHg  . Impingement syndrome of left shoulder   . Aneurysm     Apical; previously on Coumadin  . Chronic renal insufficiency     Creatinine 1.17 January 2010  . Peripheral polyneuropathy     Refractory to several medications  . Coronary artery disease     Cath 02/15/12 revealed RCA as culprit lesion w/ occlusion at site of prior stent, patent LAD stent, chronic LCx and Diagonal dz; Medical therapy   . Depression     Past Surgical History  Procedure Laterality Date  . Abdominal hysterectomy    . Cardiac defibrillator placement  05/28/2009    Medtronic ICD placed by  Dr. Caryl Comes secondary to ICM/CHF, [phone] Fidelis lead fracture with ICD storm 2010 requiring new RV lead placement  . Implantable cardioverter defibrillator generator change  09/26/13    BiV ICD generator change by Dr Rayann Heman - MDT [place] CRT-D  . Cholecystectomy    . Eye surgery    . Left heart catheterization with coronary angiogram N/A 02/15/2012    Procedure: LEFT HEART CATHETERIZATION WITH CORONARY ANGIOGRAM;  Surgeon: Peter M Martinique, MD;  Location: [facility] CATH LAB;  Service: Cardiovascular;  Laterality: N/A;  . Right heart catheterization N/A 04/27/2012    Procedure: RIGHT HEART CATH;  Surgeon: Larey Dresser, MD;  Location: [facility] CATH LAB;  Service: Cardiovascular;  Laterality: N/A;  . Implantable cardioverter defibrillator generator change N/A 09/26/2013    Procedure: IMPLANTABLE CARDIOVERTER DEFIBRILLATOR GENERATOR CHANGE;  Surgeon: Coralyn Mark, MD;  Location: [facility] CATH LAB;  Service: Cardiovascular;  Laterality: N/A;    History   Social History  . Marital Status: Widowed    Spouse Name: N/A  . Number of Children: N/A  . Years of Education: N/A   Occupational History  . Retired    Social History Main Topics  . Smoking status: Former Smoker -- 0.80 packs/day for 30 years    Types: Cigarettes    Start date: 04/05/1957    Quit date: 06/27/2002  . Smokeless tobacco: Never Used  . Alcohol Use: No  . Drug Use: Not on file  . Sexual Activity: Not Currently   Other Topics Concern  . Not on file   Social History Narrative     Filed Vitals:   08/06/14 1502  BP: 120/60  Pulse: 67  Weight: 173 lb (78.472 kg)  SpO2: 92%    PHYSICAL EXAM General: NAD HEENT: Normal. Neck: No JVD, no thyromegaly. Lungs: Faint end-expiratory wheezes with slightly diminished air entry. CV: Nondisplaced PMI.  Regular rate and rhythm, normal S1/S2, no S3/S4, no murmur. No pretibial or periankle edema.    Abdomen: Soft, nontender, no distention.  Neurologic: Alert and oriented x 3.  Psych: Normal  affect. Skin: Normal. Musculoskeletal: No gross deformities. Extremities: No clubbing or cyanosis.   ECG: Most recent ECG reviewed.  Diagnostic Studies 01/2012 Cath  Procedural Findings:  Hemodynamics:  AO 94/56 with a mean of 72 mmHg  Coronary angiography:  Coronary dominance: right  Left mainstem: There is moderate calcified disease in the distal left main up to 40%.  Left anterior descending (LAD): There is a 50% ostial stenosis in the LAD. The stent in the proximal LAD is widely patent with diffuse 20-30% in-stent disease. The second diagonal branch has a 90% stenosis at its origin.  Left circumflex (LCx): The left circumflex coronary is  occluded in the mid vessel and reconstituted by faint left to left collaterals.  Right coronary artery (RCA): The right coronary is occluded proximally at the site of prior stent.  Left ventriculography: Left ventricular angiography was not performed.  Final Conclusions:  1. Three-vessel obstructive coronary disease. The culprit lesion is the right coronary which is occluded at the site of a prior stent. The stent in the proximal LAD is patent. The left circumflex occlusion is chronic. The diagonal disease is also chronic. The patient is now 18 hours out from her presentation and her chest pain is currently resolved.  Recommendations: I would recommend continued medical therapy at this point. We will assess her LV function by echocardiogram. I think there is little advantage at this point to try and reperfuse the right coronary given her very late presentation and improvement in her chest pain symptoms.   12/2012 Echo  LVEF 40-45%, severe LVH, grade I diastolic dysfunction, mild MR, mod LAE, PASP 32     ASSESSMENT AND PLAN: 1. CAD with ischemic cardiomyopathy: Stable ischemic heart disease. Continue present therapy with ASA, Coreg, and Plavix. 2. Chronic systolic and diastolic heart failure: Euvolemic and stable. No changes to therapy.  Continue Bumex and spironolactone. 3. Hyperlipidemia: Has not wanted to try alternative statin therapy. 4. Shortness of breath: This may be related to COPD given her history of tobacco use, quit 10 years ago. Will prescribe Advair Diskus 250/50 mcg bid.  Dispo: f/u 8-10 weeks.  Kate Sable, M.D., F.A.C.C.

## 2014-08-06 NOTE — Patient Instructions (Signed)
   Begin Advair Diskus 250/50 - one puff twice a day  - new sent to pharmacy today. Continue all other medications.   Follow up in  3 months

## 2014-08-13 ENCOUNTER — Telehealth: Payer: Self-pay | Admitting: *Deleted

## 2014-08-13 MED ORDER — SPIRONOLACTONE 25 MG PO TABS: 25.0000 mg | ORAL_TABLET | Freq: Every morning | ORAL | Status: DC

## 2014-08-13 NOTE — Telephone Encounter (Signed)
Pt called for refill of spironolactone, sent to [facility] in [place]

## 2014-09-08 ENCOUNTER — Ambulatory Visit (INDEPENDENT_AMBULATORY_CARE_PROVIDER_SITE_OTHER): Payer: Medicare Other | Admitting: *Deleted

## 2014-09-08 DIAGNOSIS — I255 Ischemic cardiomyopathy: Secondary | ICD-10-CM

## 2014-09-08 DIAGNOSIS — I5023 Acute on chronic systolic (congestive) heart failure: Secondary | ICD-10-CM

## 2014-09-08 DIAGNOSIS — Z95810 Presence of automatic (implantable) cardiac defibrillator: Secondary | ICD-10-CM

## 2014-09-08 NOTE — Progress Notes (Signed)
Remote ICD transmission.   

## 2014-09-09 LAB — MDC_IDC_ENUM_SESS_TYPE_REMOTE
Battery Remaining Longevity: 69 mo
Battery Voltage: 2.99 V
Brady Statistic AP VP Percent: 4.83 %
Brady Statistic AS VS Percent: 1.62 %
Brady Statistic RA Percent Paced: 4.9 %
Brady Statistic RV Percent Paced: 96.45 %
HighPow Impedance: 53 Ohm
HighPow Impedance: 65 Ohm
Lead Channel Impedance Value: 437 Ohm
Lead Channel Impedance Value: 456 Ohm
Lead Channel Pacing Threshold Amplitude: 1.125 V
Lead Channel Pacing Threshold Pulse Width: 0.4 ms
Lead Channel Sensing Intrinsic Amplitude: 1.5 mV
Lead Channel Sensing Intrinsic Amplitude: 1.5 mV
Lead Channel Sensing Intrinsic Amplitude: 18.625 mV
Lead Channel Setting Pacing Amplitude: 2 V
Lead Channel Setting Pacing Amplitude: 2.5 V
Lead Channel Setting Pacing Amplitude: 2.5 V
Lead Channel Setting Pacing Pulse Width: 0.4 ms
MDC IDC MSMT LEADCHNL LV IMPEDANCE VALUE: 4047 Ohm
MDC IDC MSMT LEADCHNL LV IMPEDANCE VALUE: 4047 Ohm
MDC IDC MSMT LEADCHNL LV PACING THRESHOLD AMPLITUDE: 1.5 V
MDC IDC MSMT LEADCHNL LV PACING THRESHOLD PULSEWIDTH: 1 ms
MDC IDC MSMT LEADCHNL RA IMPEDANCE VALUE: 494 Ohm
MDC IDC MSMT LEADCHNL RA PACING THRESHOLD AMPLITUDE: 0.875 V
MDC IDC MSMT LEADCHNL RA PACING THRESHOLD PULSEWIDTH: 0.4 ms
MDC IDC MSMT LEADCHNL RV IMPEDANCE VALUE: 304 Ohm
MDC IDC MSMT LEADCHNL RV SENSING INTR AMPL: 18.625 mV
MDC IDC SESS DTM: 20160314141349
MDC IDC SET LEADCHNL LV PACING PULSEWIDTH: 1 ms
MDC IDC SET LEADCHNL RV SENSING SENSITIVITY: 0.3 mV
MDC IDC SET ZONE DETECTION INTERVAL: 450 ms
MDC IDC STAT BRADY AP VS PERCENT: 0.07 %
MDC IDC STAT BRADY AS VP PERCENT: 93.48 %
Zone Setting Detection Interval: 300 ms
Zone Setting Detection Interval: 340 ms
Zone Setting Detection Interval: 350 ms

## 2014-09-10 NOTE — Progress Notes (Addendum)
EPIC Encounter for ICM Monitoring  Patient Name: [name] is a 81 y.o. female Date: 09/10/2014 Primary Care Physican: Deloria Lair, MD  Primary Cardiologist: Bronson Ing  Electrophysiologist: Allred Dry Weight: 172 lbs       In the past month, have you:  1. Gained more than 2 pounds in a day or more than 5 pounds in a week?  Per the patient's report, her weight is typically been around 172-173 lbs. She did have some abdominal fullness last week and noticed she diruresed about 4 lbs one day last week. Lowest weigh last week was 168 lbs.   2. Had changes in your medications (with verification of current medications)? Yes. She had been on bumex and this was changed to torsemide. She also restarted alprazolam as well.   3. Had more shortness of breath than is usual for you? No. She will typically get SOB with exertion. She will sit and wear her O2 for a little bit and then remove this. She also uses this PRN at night- this is set at 2 L.   4. Limited your activity because of shortness of breath? no  5. Not been able to sleep because of shortness of breath? no  6. Had increased swelling in your feet or ankles? no  7. Had symptoms of dehydration (dizziness, dry mouth, increased thirst, decreased urine output) no  8. Had changes in sodium restriction? no  9. Been compliant with medication? Yes   ICM trend:   Follow-up plan: ICM clinic phone appointment: 09/25/14. The patient was some what hard to follow today. She states she has had multiple CT scans with contrast due to a UTI that will not go away and some stomach issues. She reports having a history of IBS. She states she has been followed by a urologist. She feels some of her problems are related to her bladder dropping. She states the urologist has offered her something to try to help with this, but it is something that needs to be inserted and cleaned by the patient. She states she is not comfortable with this. She does  find it hard to urinate. She was also seen today by a GI doctor in [place] and was not happy as to how she was treated at this visit. She states the gastroenterologist states she is not having a stomach problem and needs to follow up with urology. The patient is very frustrated at this point. We reviewed and updated her medication list. She has recently changed from bumex to torsemide 20 mg two tablets daily. Per her report, this was prior to the drop in her optivol reading. She is doing ok, at this point, in regards to any symptoms with her heart. She states she saw Dr. Bronson Ing within the last month and he put her on an inhaler because she was wheezing. She states she is not doing this now and there fore has only been using the inhaler as needed. I have advised her to follow back up with Dr. Scotty Court for her non- cardiac issues. I will have her send another transmission in 2 weeks to follow up on her optivol readings due to the recent elevation she has had.   Copy of note sent to patient's primary care physician, primary cardiologist, and device following physician.  Alvis Lemmings, RN, BSN 09/10/2014 5:21 PM

## 2014-09-10 NOTE — Addendum Note (Signed)
Addended by: Alvis Lemmings C on: 09/10/2014 05:36 PM   Modules accepted: Orders, Medications

## 2014-09-11 NOTE — Addendum Note (Signed)
Addended by: Alvis Lemmings C on: 09/11/2014 12:44 PM   Modules accepted: Level of Service

## 2014-09-16 ENCOUNTER — Encounter: Payer: Self-pay | Admitting: *Deleted

## 2014-09-23 ENCOUNTER — Encounter: Payer: Self-pay | Admitting: Internal Medicine

## 2014-09-25 ENCOUNTER — Encounter: Payer: Self-pay | Admitting: *Deleted

## 2014-09-25 ENCOUNTER — Ambulatory Visit (INDEPENDENT_AMBULATORY_CARE_PROVIDER_SITE_OTHER): Payer: Medicare Other | Admitting: *Deleted

## 2014-09-25 ENCOUNTER — Telehealth: Payer: Self-pay | Admitting: Cardiology

## 2014-09-25 DIAGNOSIS — Z95810 Presence of automatic (implantable) cardiac defibrillator: Secondary | ICD-10-CM

## 2014-09-25 DIAGNOSIS — I5042 Chronic combined systolic (congestive) and diastolic (congestive) heart failure: Secondary | ICD-10-CM

## 2014-09-25 NOTE — Telephone Encounter (Signed)
Spoke with pt and reminded pt of remote transmission that is due today. Pt verbalized understanding.   

## 2014-09-25 NOTE — Progress Notes (Signed)
EPIC Encounter for ICM Monitoring  Patient Name: [name] is a 81 y.o. female Date: 09/25/2014 Primary Care Physican: Deloria Lair, MD Primary Cardiologist: Bronson Ing Electrophysiologist: Allred Dry Weight: 168 lbs       In the past month, have you:  1. Gained more than 2 pounds in a day or more than 5 pounds in a week? no  2. Had changes in your medications (with verification of current medications)? Yes. She has recently started on Prednisone 20 mg daily x 7 days- (IBS/ arthritis).   3. Had more shortness of breath than is usual for you? no  4. Limited your activity because of shortness of breath? no  5. Not been able to sleep because of shortness of breath? no  6. Had increased swelling in your feet or ankles? no  7. Had symptoms of dehydration (dizziness, dry mouth, increased thirst, decreased urine output) no  8. Had changes in sodium restriction? no  9. Been compliant with medication? Yes   ICM trend:   Follow-up plan: ICM clinic phone appointment: 10/27/14. 2 week f/u from 3/14 transmission. The patient has been doing well from a cardiac standpoint. She is having issues with her IBS/ arthritis that is causing some pain for her. She has recently started a short course of prednisone. No changes made today.   Copy of note sent to patient's primary care physician, primary cardiologist, and device following physician.  Alvis Lemmings, RN, BSN 09/25/2014 2:42 PM

## 2014-10-27 ENCOUNTER — Ambulatory Visit (INDEPENDENT_AMBULATORY_CARE_PROVIDER_SITE_OTHER): Payer: Medicare Other | Admitting: *Deleted

## 2014-10-27 DIAGNOSIS — I5023 Acute on chronic systolic (congestive) heart failure: Secondary | ICD-10-CM | POA: Diagnosis not present

## 2014-10-27 DIAGNOSIS — Z95810 Presence of automatic (implantable) cardiac defibrillator: Secondary | ICD-10-CM | POA: Diagnosis not present

## 2014-10-27 NOTE — Progress Notes (Signed)
EPIC Encounter for ICM Monitoring  Patient Name: [name] is a 81 y.o. female Date: 10/27/2014 Primary Care Physican: Shirley Lair, MD Primary Cardiologist: [name] Electrophysiologist: [name] Dry Weight: 170 lbs       In the past month, have you:  1. Gained more than 2 pounds in a day or more than 5 pounds in a week? no  2. Had changes in your medications (with verification of current medications)? Yes. [name] was increased to 20 mg three tablets (60 mg) daily, she was started on [name] 65 mg two tablets daily, and started on [name] 150 mg two tablets daily.   3. Had more shortness of breath than is usual for you? no  4. Limited your activity because of shortness of breath? no  5. Not been able to sleep because of shortness of breath? no  6. Had increased swelling in your feet or ankles? no  7. Had symptoms of dehydration (dizziness, dry mouth, increased thirst, decreased urine output) no  8. Had changes in sodium restriction? no  9. Been compliant with medication? Yes   ICM trend:   Follow-up plan: ICM clinic phone appointment: 11/10/14. The patient's optivol readings have been up from ~ 4/1 to present. She reports today that she was hospitalized for about 8 days, not long after I spoke with her last month. She reports that she started vomiting blood, enough to fill up a small trash can. She was taken to [name] and diagnosed with 2 bleeding ulcers. She required tow units of blood, but was only given 1 unit due to a reaction with the first unit. She is due to follow up with the GI doctor in [place], [name], on Wednesday next week. I advised her that since she was on a short course of prednisone, then hospitalized and received a transfusion and IV fluids, this has most likely contributed to her rise in her optivol readings. I have advised her to increase her [name] to four pills  (80 mg) once daily x 3 days, then resume her normal dosing. I will follow  up on her optivol readings in 2 weeks.   Copy of note sent to patient's primary care physician, primary cardiologist, and device following physician.  Shirley Lemmings, RN, BSN 10/27/2014 3:55 PM

## 2014-10-27 NOTE — Addendum Note (Signed)
Addended byAlvis Lemmings C on: 10/27/2014 04:17 PM   Modules accepted: Medications

## 2014-11-10 ENCOUNTER — Ambulatory Visit (INDEPENDENT_AMBULATORY_CARE_PROVIDER_SITE_OTHER): Payer: Medicare Other | Admitting: *Deleted

## 2014-11-10 ENCOUNTER — Encounter: Payer: Self-pay | Admitting: *Deleted

## 2014-11-10 ENCOUNTER — Telehealth: Payer: Self-pay | Admitting: Cardiology

## 2014-11-10 DIAGNOSIS — Z95810 Presence of automatic (implantable) cardiac defibrillator: Secondary | ICD-10-CM

## 2014-11-10 DIAGNOSIS — I5023 Acute on chronic systolic (congestive) heart failure: Secondary | ICD-10-CM

## 2014-11-10 NOTE — Progress Notes (Addendum)
EPIC Encounter for ICM Monitoring  Patient Name: [name] is a 81 y.o. female Date: 11/10/2014 Primary Care Physican: Deloria Lair, MD Primary Cardiologist: Bronson Ing Electrophysiologist: Allred Dry Weight: 170 lbs       In the past month, have you:  1. Gained more than 2 pounds in a day or more than 5 pounds in a week? no  2. Had changes in your medications (with verification of current medications)? no  3. Had more shortness of breath than is usual for you? no  4. Limited your activity because of shortness of breath? no  5. Not been able to sleep because of shortness of breath? no  6. Had increased swelling in your feet or ankles? no  7. Had symptoms of dehydration (dizziness, dry mouth, increased thirst, decreased urine output) no  8. Had changes in sodium restriction? no  9. Been compliant with medication? Yes   ICM trend:   Follow-up plan: ICM clinic phone appointment: 12/09/14. This is a 2 week follow up ICM transmission for the patient. When I spoke with her on 10/27/14, she had had a recent hospitalization for a GI bleed requiring transfusion. She was also on prednisone as well. I had increased her torsemide from 60 mg daily to 80 mg daily x 3 days. Her impedence is approaching baseline at present. She has developed a foot ulcer over the last 10 days. Her podiatrist has instructed her to stay off her foot as much as possible. She is getting daily dressing changes on this. She is diabetic, but her blood sugars are under control now that she is off prednisone. She is due to follow up with  Dr. Britta Mccreedy in GI on Wednesday - per the patient's report, he will scope her that day. She is symptomatically doing well at this time. I have advised her to go ahead and take 80 mg mg of torsemide tomorrow, then resume 60 mg daily on her torsemide. She is aware to call should her weight go up more than 2- 3 lbs in 24 hours or she develop any change in her breathing.   Copy of  note sent to patient's primary care physician, primary cardiologist, and device following physician.  Alvis Lemmings, RN, BSN 11/10/2014 5:42 PM

## 2014-11-10 NOTE — Telephone Encounter (Signed)
LMOVM reminding pt to send remote transmission.   

## 2014-11-11 ENCOUNTER — Ambulatory Visit: Payer: Medicare Other | Admitting: Cardiovascular Disease

## 2014-11-19 ENCOUNTER — Telehealth: Payer: Self-pay | Admitting: Internal Medicine

## 2014-11-19 NOTE — Telephone Encounter (Signed)
Patient thinks she may need fluid pill adjustment as home health nurse heard some fluid on her heart.  Please call patient.

## 2014-11-19 NOTE — Telephone Encounter (Signed)
I attempted to call the patient. No answer and no voice mail. I will forward to Debroah Loop- RN in the device clinic to see if she can try the patient again tomorrow. Please have her send a repeat transmission. I will be out of the office tomorrow and Friday. The patient's last transmission on 5/16 showed that her optivol readings had been elevated, but that her impedence was returning to baseline. I had her to increase her torsemide from 60 mg daily to 80 mg daily x 1 day, then resume her normal dosing.   Claiborne Billings- this is an [place] patient, but I'm not sure if Terrence Dupont will be in on 5/26- can you make sure she sees this. Thank you!

## 2014-11-20 NOTE — Telephone Encounter (Signed)
Spoke with patient about Optivol reading- impedence reading normal since elevation earlier in May. She states that she took 80mg  of Torsemide rather than her 60mg  dose yesterday. She denies symptoms of HF exacerbation. Instructed to follow her normal medication regimen. Reminded of upcoming appointment and remote transmission. Patient verbalizes understanding and is appreciative of call.  Will forward to Alvis Lemmings, RN.

## 2014-12-05 ENCOUNTER — Ambulatory Visit: Payer: Medicare Other | Admitting: Cardiovascular Disease

## 2014-12-09 ENCOUNTER — Ambulatory Visit (INDEPENDENT_AMBULATORY_CARE_PROVIDER_SITE_OTHER): Payer: Medicare Other | Admitting: *Deleted

## 2014-12-09 DIAGNOSIS — I255 Ischemic cardiomyopathy: Secondary | ICD-10-CM

## 2014-12-09 DIAGNOSIS — Z95810 Presence of automatic (implantable) cardiac defibrillator: Secondary | ICD-10-CM | POA: Diagnosis not present

## 2014-12-09 NOTE — Progress Notes (Signed)
Remote ICD transmission.   

## 2014-12-11 ENCOUNTER — Encounter: Payer: Self-pay | Admitting: *Deleted

## 2014-12-11 NOTE — Addendum Note (Signed)
Addended by: Alvis Lemmings C on: 12/11/2014 12:05 PM   Modules accepted: Level of Service

## 2014-12-11 NOTE — Progress Notes (Signed)
EPIC Encounter for ICM Monitoring  Patient Name: [name] is a 81 y.o. female Date: 12/11/2014 Primary Care Physican: Deloria Lair, MD Primary Cardiologist: Bronson Ing  Electrophysiologist: Allred Dry Weight: 164 lbs       In the past month, have you:  1. Gained more than 2 pounds in a day or more than 5 pounds in a week? No. Her weight is down from 170 lbs to 164 lbs.   2. Had changes in your medications (with verification of current medications)? no  3. Had more shortness of breath than is usual for you? no  4. Limited your activity because of shortness of breath? no  5. Not been able to sleep because of shortness of breath? no  6. Had increased swelling in your feet or ankles? no  7. Had symptoms of dehydration (dizziness, dry mouth, increased thirst, decreased urine output) no  8. Had changes in sodium restriction? no  9. Been compliant with medication? Yes   ICM trend:   Follow-up plan: ICM clinic phone appointment: 01/15/15. The patient's optivol trends are back to baseline. She is doing well from a cardiac standpoint. She does have a small ulcer on her foot that she is treating. She is having weakness. She reports seeing Dr. Scotty Court last week and that he did labs on her at the time. She states she received a call from his office that labs looked good. She is due to follow up with Dr. Britta Mccreedy- GI in the next week or so and with Dr. Bronson Ing on 12/23/14. No changes made today.  Copy of note sent to patient's primary care physician, primary cardiologist, and device following physician.  Alvis Lemmings, RN, BSN 12/11/2014 12:00 PM

## 2014-12-12 NOTE — Progress Notes (Signed)
Reviewed. Thank you.

## 2014-12-14 LAB — CUP PACEART REMOTE DEVICE CHECK
Brady Statistic AP VP Percent: 5.15 %
Brady Statistic AP VS Percent: 0.07 %
Brady Statistic AS VP Percent: 93.36 %
Brady Statistic RA Percent Paced: 5.22 %
Brady Statistic RV Percent Paced: 97.49 %
Date Time Interrogation Session: 20160614062826
HighPow Impedance: 53 Ohm
HighPow Impedance: 65 Ohm
Lead Channel Impedance Value: 342 Ohm
Lead Channel Impedance Value: 399 Ohm
Lead Channel Impedance Value: 4047 Ohm
Lead Channel Impedance Value: 494 Ohm
Lead Channel Pacing Threshold Amplitude: 1 V
Lead Channel Pacing Threshold Amplitude: 2.125 V
Lead Channel Pacing Threshold Pulse Width: 0.4 ms
Lead Channel Pacing Threshold Pulse Width: 0.4 ms
Lead Channel Sensing Intrinsic Amplitude: 1.75 mV
Lead Channel Sensing Intrinsic Amplitude: 1.75 mV
Lead Channel Sensing Intrinsic Amplitude: 12.875 mV
Lead Channel Setting Pacing Amplitude: 2 V
Lead Channel Setting Pacing Amplitude: 3.25 V
Lead Channel Setting Pacing Pulse Width: 1 ms
Lead Channel Setting Sensing Sensitivity: 0.3 mV
MDC IDC MSMT BATTERY REMAINING LONGEVITY: 47 mo
MDC IDC MSMT BATTERY VOLTAGE: 2.98 V
MDC IDC MSMT LEADCHNL LV IMPEDANCE VALUE: 4047 Ohm
MDC IDC MSMT LEADCHNL LV PACING THRESHOLD PULSEWIDTH: 1 ms
MDC IDC MSMT LEADCHNL RA IMPEDANCE VALUE: 494 Ohm
MDC IDC MSMT LEADCHNL RV PACING THRESHOLD AMPLITUDE: 1.25 V
MDC IDC MSMT LEADCHNL RV SENSING INTR AMPL: 12.875 mV
MDC IDC SET LEADCHNL RV PACING AMPLITUDE: 2.5 V
MDC IDC SET LEADCHNL RV PACING PULSEWIDTH: 0.4 ms
MDC IDC SET ZONE DETECTION INTERVAL: 350 ms
MDC IDC STAT BRADY AS VS PERCENT: 1.42 %
Zone Setting Detection Interval: 300 ms
Zone Setting Detection Interval: 340 ms
Zone Setting Detection Interval: 450 ms

## 2014-12-18 ENCOUNTER — Encounter: Payer: Self-pay | Admitting: Cardiology

## 2014-12-23 ENCOUNTER — Ambulatory Visit (INDEPENDENT_AMBULATORY_CARE_PROVIDER_SITE_OTHER): Payer: Medicare Other | Admitting: Cardiovascular Disease

## 2014-12-23 VITALS — BP 104/58 | HR 65 | Ht 66.0 in | Wt 164.0 lb

## 2014-12-23 DIAGNOSIS — I255 Ischemic cardiomyopathy: Secondary | ICD-10-CM | POA: Diagnosis not present

## 2014-12-23 DIAGNOSIS — I5042 Chronic combined systolic (congestive) and diastolic (congestive) heart failure: Secondary | ICD-10-CM

## 2014-12-23 DIAGNOSIS — I2510 Atherosclerotic heart disease of native coronary artery without angina pectoris: Secondary | ICD-10-CM

## 2014-12-23 DIAGNOSIS — E785 Hyperlipidemia, unspecified: Secondary | ICD-10-CM

## 2014-12-23 DIAGNOSIS — Z95810 Presence of automatic (implantable) cardiac defibrillator: Secondary | ICD-10-CM | POA: Diagnosis not present

## 2014-12-23 NOTE — Patient Instructions (Signed)
Your physician wants you to follow-up in: 6 months with Dr. Koneswaran. You will receive a reminder letter in the mail two months in advance. If you don't receive a letter, please call our office to schedule the follow-up appointment.  Your physician recommends that you continue on your current medications as directed. Please refer to the Current Medication list given to you today.  Thank you for choosing [facility]!   

## 2014-12-23 NOTE — Progress Notes (Signed)
Patient ID: [name], female   DOB: 1933-07-05, 81 y.o.   MRN: [id number]      SUBJECTIVE: The patient returns for routine cardiovascular follow-up. She has a history of coronary artery disease with an ischemic cardiomyopathy and chronic systolic and diastolic heart failure.she has undergone prior stenting with her most recent coronary angiogram performed in August 2013 which showed left main 40%, LAD 50% ostial with patent stent in prox LAD, D2 90%, LCX occluded in mid vessel with left to left collaterals, RCA occluded proximally at prior stent site. She has been medically managed. She has a CRT-D system with most recent interrogation on 11/29/14. She is followed by Dr. Rayann Heman. Echocardiogram in July 2014 showed LVEF 40-45% with severe LVH and grade I diastolic dysfunction. She has a history of hyperlipidemia and developed joint pains with both Lipitor and Crestor and has not wanted to try an alternative statin. She denies chest pain and leg swelling.  She was hospitalized for a GI bleed in April 2016 at [facility]. An EGD showed 2 prepyloric ulcers with a clean base and no active bleeding. While discharge medications included aspirin and Plavix, she is no longer on either of them.  She denies chest pain and shortness of breath. Her primary complaints relate to foot pain and ulcer which has recently healed.  Review of Systems: As per "subjective", otherwise negative.  Allergies  Allergen Reactions  . Metformin     Upset stomach  . Sulfonamide Derivatives     swelling    Current Outpatient Prescriptions  Medication Sig Dispense Refill  . ALPRAZolam (XANAX) 0.5 MG tablet Take 0.5 mg by mouth at bedtime as needed for anxiety.    . Ascorbic Acid (VITAMIN C) 100 MG tablet Take 100 mg by mouth daily.    . carvedilol (COREG) 3.125 MG tablet Take 1 tablet (3.125 mg total) by mouth 2 (two) times daily with a meal. 180 tablet 3  . citalopram (CELEXA) 20 MG tablet Take 20 mg by  mouth daily.    Marland Kitchen HYDROmorphone (DILAUDID) 4 MG tablet Take 6 mg by mouth every 6 (six) hours as needed for severe pain.     Marland Kitchen insulin glargine (LANTUS) 100 UNIT/ML injection Inject 60 Units into the skin 2 (two) times daily.     . Iron TABS 65 mg. Take two tablets (130 mg) by mouth once daily    . nitroGLYCERIN (NITROSTAT) 0.4 MG SL tablet Place 1 tablet (0.4 mg total) under the tongue every 5 (five) minutes as needed for chest pain. 25 tablet 3  . OXYGEN-HELIUM IN 2 L as needed.    . ranitidine (ZANTAC) 150 MG tablet Take two tablets (300 mg) by mouth once daily    . spironolactone (ALDACTONE) 25 MG tablet Take 1 tablet (25 mg total) by mouth every morning. 30 tablet 6  . torsemide (DEMADEX) 20 MG tablet Take three tablets (60 mg) by mouth once daily     No current facility-administered medications for this visit.    Past Medical History  Diagnosis Date  . Type 2 diabetes mellitus   . Anxiety disorder   . GERD (gastroesophageal reflux disease)   . Hyperlipidemia     H/o GI intolerance to Lipitor  . Ischemic cardiomyopathy     s/p Medtronic BiV ICD 2006  . Systolic and diastolic CHF, chronic     echo 03/2012 EF 123456, mod diastolic dysfunction, mod MR, mod LAE, mod-severe TR, PASP 52mmHg  . Impingement syndrome of left  shoulder   . Aneurysm     Apical; previously on Coumadin  . Chronic renal insufficiency     Creatinine 1.17 January 2010  . Peripheral polyneuropathy     Refractory to several medications  . Coronary artery disease     Cath 02/15/12 revealed RCA as culprit lesion w/ occlusion at site of prior stent, patent LAD stent, chronic LCx and Diagonal dz; Medical therapy   . Depression     Past Surgical History  Procedure Laterality Date  . Abdominal hysterectomy    . Cardiac defibrillator placement  05/28/2009    Medtronic ICD placed by Dr. Caryl Comes secondary to ICM/CHF, [phone] Fidelis lead fracture with ICD storm 2010 requiring new RV lead placement  . Implantable cardioverter  defibrillator generator change  09/26/13    BiV ICD generator change by Dr Rayann Heman - [facility] [place] CRT-D  . Cholecystectomy    . Eye surgery    . Left heart catheterization with coronary angiogram N/A 02/15/2012    Procedure: LEFT HEART CATHETERIZATION WITH CORONARY ANGIOGRAM;  Surgeon: Peter M Martinique, MD;  Location[facility] CATH LAB;  Service: Cardiovascular;  Laterality: N/A;  . Right heart catheterization N/A 04/27/2012    Procedure: RIGHT HEART CATH;  Surgeon: Larey Dresser, MD;  Location: [facility] CATH LAB;  Service: Cardiovascular;  Laterality: N/A;  . Implantable cardioverter defibrillator generator change N/A 09/26/2013    Procedure: IMPLANTABLE CARDIOVERTER DEFIBRILLATOR GENERATOR CHANGE;  Surgeon: Coralyn Mark, MD;  Location: [facility] CATH LAB;  Service: Cardiovascular;  Laterality: N/A;    History   Social History  . Marital Status: Widowed    Spouse Name: N/A  . Number of Children: N/A  . Years of Education: N/A   Occupational History  . Retired    Social History Main Topics  . Smoking status: Former Smoker -- 0.80 packs/day for 30 years    Types: Cigarettes    Start date: 04/05/1957    Quit date: 06/27/2002  . Smokeless tobacco: Never Used  . Alcohol Use: No  . Drug Use: Not on file  . Sexual Activity: Not Currently   Other Topics Concern  . Not on file   Social History Narrative     Filed Vitals:   12/23/14 1132  BP: 104/58  Pulse: 65  Height: 5\' 6"  (1.676 m)  Weight: 164 lb (74.39 kg)  SpO2: 94%    PHYSICAL EXAM General: NAD HEENT: Normal. Neck: No JVD, no thyromegaly. Lungs: Clear b/l, diminished at bases, no rales/wheezes. CV: Nondisplaced PMI. Regular rate and rhythm, normal S1/S2, no S3/S4, no murmur. No pretibial or periankle edema.  Abdomen: Soft, nontender, no distention.  Neurologic: Alert and oriented x 3.  Psych: Normal affect. Skin: Normal. Musculoskeletal: No gross deformities. Extremities: No clubbing or cyanosis.   ECG: Most recent ECG  reviewed.      ASSESSMENT AND PLAN: 1. CAD with ischemic cardiomyopathy: Stable ischemic heart disease. No longer on ASA or Plavix. Continue Coreg.   2. Chronic systolic and diastolic heart failure: Euvolemic and stable. No changes to therapy. Continue Bumex and spironolactone.  3. Hyperlipidemia: Has not wanted to try alternative statin therapy.  4. CRT-D: Will look into details regarding interrogation on 11/29/14. Followed by Dr. Rayann Heman.  Dispo: f/u 6 months.  Kate Sable, M.D., F.A.C.C.

## 2014-12-24 ENCOUNTER — Encounter: Payer: Self-pay | Admitting: Internal Medicine

## 2015-01-07 ENCOUNTER — Telehealth: Payer: Self-pay | Admitting: Cardiovascular Disease

## 2015-01-07 NOTE — Telephone Encounter (Signed)
Needs information about her oxygen and who is responsible for it  Also needs to know who started her on the oxygen

## 2015-01-08 NOTE — Telephone Encounter (Signed)
PMD managing.

## 2015-01-15 ENCOUNTER — Ambulatory Visit (INDEPENDENT_AMBULATORY_CARE_PROVIDER_SITE_OTHER): Payer: Medicare Other | Admitting: *Deleted

## 2015-01-15 ENCOUNTER — Telehealth: Payer: Self-pay | Admitting: Cardiology

## 2015-01-15 ENCOUNTER — Encounter: Payer: Self-pay | Admitting: *Deleted

## 2015-01-15 DIAGNOSIS — Z95810 Presence of automatic (implantable) cardiac defibrillator: Secondary | ICD-10-CM

## 2015-01-15 DIAGNOSIS — I5022 Chronic systolic (congestive) heart failure: Secondary | ICD-10-CM

## 2015-01-15 NOTE — Progress Notes (Signed)
EPIC Encounter for ICM Monitoring  Patient Name: [name] is a 81 y.o. female Date: 01/15/2015 Primary Care Physican: Deloria Lair, MD Primary Cardiologist: Bronson Ing Electrophysiologist: Allred Dry Weight: 164 lbs  Bi-V pacing: 97.5%       In the past month, have you:  1. Gained more than 2 pounds in a day or more than 5 pounds in a week? No. Her weight is down to 158 lbs.  2. Had changes in your medications (with verification of current medications)? Yes. The patient has had problems with sinus congestion and was put on prednisone 20 mg tablets - take two (40 mg) daily x 5 days.  3. Had more shortness of breath than is usual for you? no  4. Limited your activity because of shortness of breath? no  5. Not been able to sleep because of shortness of breath? no  6. Had increased swelling in your feet or ankles? no  7. Had symptoms of dehydration (dizziness, dry mouth, increased thirst, decreased urine output) no  8. Had changes in sodium restriction? no  9. Been compliant with medication? Yes   ICM trend:   Follow-up plan: ICM clinic phone appointment: 02/16/15. The patient had sinus congestion recently and was placed on a prednisone x 5 days. She was to take 20 mg tablets two tablets (40 mg) once daily. She reports that on days #2 she took 40 mg one the AM and 40 mg in the PM inadvertantly. She has had trouble ever since with her neuropathy. She also has an ulcer on her foot that recently started up. She has seen podiatry this week . She is to follow up with Dr. Scotty Court next week. Today her optivol readings show she may be slightly dehydrated. She is drinking fluids, but her weight is down from 164 lbs to 158 lbs. I have advised her to hold her torsemide tomorrow. She originally takes torsemide 20 mg three tablets (60 mg) daily. I have encouraged after she holds tomorrows dose, to then add back one tablet a day on her torsemide after that until she is back on torsemide  60 mg daily. She verbalizes understanding.   Copy of note sent to patient's primary care physician, primary cardiologist, and device following physician.  Alvis Lemmings, RN, BSN 01/15/2015 2:45 PM

## 2015-01-15 NOTE — Telephone Encounter (Signed)
Spoke with pt and reminded pt of remote transmission that is due today. Pt verbalized understanding.   

## 2015-02-16 ENCOUNTER — Encounter: Payer: Medicare Other | Admitting: *Deleted

## 2015-02-16 ENCOUNTER — Telehealth: Payer: Self-pay | Admitting: Cardiology

## 2015-02-16 NOTE — Telephone Encounter (Signed)
Spoke with pt and reminded pt of remote transmission that is due today. Pt verbalized understanding.   

## 2015-02-17 ENCOUNTER — Telehealth: Payer: Self-pay

## 2015-02-17 NOTE — Telephone Encounter (Signed)
ICM transmission received.  Call to patient. She stated she was lying down and would rather talk another day.  I told her I would attempt to call her another day.

## 2015-02-18 ENCOUNTER — Telehealth: Payer: Self-pay

## 2015-02-18 ENCOUNTER — Telehealth: Payer: Self-pay | Admitting: Internal Medicine

## 2015-02-18 NOTE — Telephone Encounter (Signed)
Attempted call to patient for ICM transmission review.  No answer and message left on voice mail for return call with number

## 2015-02-20 NOTE — Telephone Encounter (Signed)
Attempted 2 calls to patient for ICM transmission review but unable to reach.  Optivol impedance revealed below baseline from ~01/29/2015 to 02/16/2015.  Letter sent to patient to call if experiencing any symptoms.

## 2015-02-27 ENCOUNTER — Encounter: Payer: Medicare Other | Admitting: Internal Medicine

## 2015-03-04 ENCOUNTER — Encounter (INDEPENDENT_AMBULATORY_CARE_PROVIDER_SITE_OTHER): Payer: Self-pay | Admitting: *Deleted

## 2015-03-05 NOTE — Telephone Encounter (Signed)
error 

## 2015-03-26 ENCOUNTER — Other Ambulatory Visit: Payer: Self-pay | Admitting: Cardiovascular Disease

## 2015-03-26 ENCOUNTER — Telehealth: Payer: Self-pay | Admitting: Internal Medicine

## 2015-03-26 ENCOUNTER — Ambulatory Visit (INDEPENDENT_AMBULATORY_CARE_PROVIDER_SITE_OTHER): Payer: Medicare Other

## 2015-03-26 DIAGNOSIS — I5022 Chronic systolic (congestive) heart failure: Secondary | ICD-10-CM

## 2015-03-26 DIAGNOSIS — Z95810 Presence of automatic (implantable) cardiac defibrillator: Secondary | ICD-10-CM | POA: Diagnosis not present

## 2015-03-26 NOTE — Telephone Encounter (Signed)
Spoke with patient for ICM call.

## 2015-03-26 NOTE — Progress Notes (Signed)
EPIC Encounter for ICM Monitoring  Patient Name: [name] is a 81 y.o. female Date: 03/26/2015 Primary Care Physican: Deloria Lair, MD Primary Cardiologist: Bronson Ing Electrophysiologist: Allred Dry Weight: 164 lbs       In the past month, have you:  1. Gained more than 2 pounds in a day or more than 5 pounds in a week? Yes, 4 pounds within 2 weeks.  2. Had changes in your medications (with verification of current medications)? no  3. Had more shortness of breath than is usual for you? Yes but she always has some shortness of breath.  Currently using oxygen  4. Limited your activity because of shortness of breath? no  5. Not been able to sleep because of shortness of breath? no  6. Had increased swelling in your feet or ankles? no  7. Had symptoms of dehydration (dizziness, dry mouth, increased thirst, decreased urine output) no  8. Had changes in sodium restriction? no  9. Been compliant with medication? No, she was taking Torsemide 20 mg - 2 tablets daily for last month instead of 3 tables daily   ICM trend:  Follow-up plan: ICM clinic phone appointment 04/02/2015 repeat transmission.  Optivol impedance significantly below baseline ~02/21/2015 to 03/26/2015 date of transmission.  She was also below baseline ~01/30/2015 to 02/19/2015.  Unable to contact member for August transmission.  She stated she gained weight and concerned about fluid overload.  She has been hospitalized twice at [facility] in last 2 weeks for diverticulitis.  Her blood sugars have been high due to recent cortisone shot for shoulder.  She stated she is not feeling great.  She denied any leg swelling and has not needed any extra pillows to sleep on at night. She stated she does not think Torsemide is working as well for her because urine output has been less in the last month.  Asked if her meds changed when she was discharged from the hospital and she said no.  She stated her bottle of Torsemide  does says she should be taking total of $Remove'60mg'bZXPTpn$  every day but she has only been taking $RemoveBef'40mg'UTMuwZQOsM$  daily for the last month.         Advised to take her normal prescribed dosage of Torsemide $RemoveBefo'20mg'SWEmYXAjwPW$  - 3 tablets ($RemoveBe'60mg'DCVRIasic$ ) every morning and add additional 20 mg 1 tablet in the evening x 2 days.  Advised after the additional 2 days of Torsemide to resume the       prescribed dosage of 20 mg - 3 tablets every am.  She verbalized understanding.  Attempted call to Dr Rayna Sexton office to obtain any        recent BMET lab.  Left message for return call or fax any results.         Advised patient that if no recent labs labs for BUN and Creatinine have been drawn then she may need to have it done next week.          Will call her back tomorrow if she needs to have lab work done.         Repeat Optivol 04/02/2015.         Advised I would forward to Dr Bronson Ing and Dr Rayann Heman for review and call back with any further recommendations.    Copy of note sent to patient's primary care physician, primary cardiologist, and device following physician.  Rosalene Billings, RN, CCM 03/26/2015 4:25 PM         Received fax of  BMET results of 12/05/2014 labs from Dr Rayna Sexton office. BUN - 26; Creatinine - 1.18; eGFR - 44; eGFR if ARF-AM - 54.

## 2015-03-26 NOTE — Telephone Encounter (Signed)
New message      Pt has an ICD.  She want to know if her fluid level is up by checking her device?  She has gained 4 lbs in the last 2 weeks

## 2015-03-26 NOTE — Telephone Encounter (Signed)
Spoke with patient.  She stated she has gained 4 pounds in last 2 weeks. Weight increase from 164 lbs to 168 lbs.  She has some shortness of breath and using Oxygen but is not a new symptom.  She does not think she is having as much as urine output as previously after taking Torsemide.  She received cortisone injection in her shoulder yesterday.  Advised tried to contact her 02/16/2015 to review the August Optivol transmission which showed she may be experiencing slight fluid retention.  Requested another transmission be sent today.  She stated she has been in the hospital twice in the last 2 weeks due to diverticulitis.  Once new transmission is received will call patient.

## 2015-03-27 ENCOUNTER — Telehealth: Payer: Self-pay

## 2015-03-27 NOTE — Telephone Encounter (Signed)
Spoke with patient and stated I received copy of her labs from June.  No recommendation at this time to obtain in labs and if that changes will call her. She stated she has lost 2 lbs overnight and feeling better today.  Advised to call if she has any further problems.  ICM repeated transmission scheduled for 04/02/2015.

## 2015-03-31 ENCOUNTER — Ambulatory Visit (INDEPENDENT_AMBULATORY_CARE_PROVIDER_SITE_OTHER): Payer: Medicare Other

## 2015-03-31 ENCOUNTER — Telehealth: Payer: Self-pay | Admitting: Cardiology

## 2015-03-31 DIAGNOSIS — Z95810 Presence of automatic (implantable) cardiac defibrillator: Secondary | ICD-10-CM

## 2015-03-31 DIAGNOSIS — I5022 Chronic systolic (congestive) heart failure: Secondary | ICD-10-CM

## 2015-03-31 NOTE — Progress Notes (Addendum)
EPIC Encounter for ICM Monitoring  Patient Name: [name] is a 81 y.o. female Date: 03/31/2015 Primary Care Physican: Deloria Lair, MD Primary Cardiologist: Bronson Ing Electrophysiologist: Allred Dry Weight: 160 lb       In the past month, have you:  1. Gained more than 2 pounds in a day or more than 5 pounds in a week? no  2. Had changes in your medications (with verification of current medications)? no  3. Had more shortness of breath than is usual for you? no  4. Limited your activity because of shortness of breath? no  5. Not been able to sleep because of shortness of breath? no  6. Had increased swelling in your feet or ankles? no  7. Had symptoms of dehydration (dizziness, dry mouth, increased thirst, decreased urine output) no  8. Had changes in sodium restriction? no  9. Been compliant with medication? Yes    ICM trend:         Follow-up plan: ICM clinic phone appointment on 04/08/2015 for repeat transmission.   Repeat Optivol transmission revealed thoracic impedance continues to be below baseline today.  She has been below baseline most of August         and September.  Patient reported she is now taking correct dosage of Furosemide 60 mg daily but did not add the extra dosage of 20mg  x 2 days as discussed on 03/26/2015 due to she lost 4 pounds.  She reported urine output has               increase since taking the Furosemide 60mg  daily (she has Optivol transmission today continues below baseline.  She reported she has a little more SOB on exertion than usual and fatigue.  She is using Oxygen as needed.     Suggested an appointment with Dr Bronson Ing but she declined appointment and prefers to wait to see how she feels.  She stated she will take the extra Furosemide 20 mg - 1 tablet x 2 days and then return to Lasix 20 mg - 3 tablets daily (60mg ).  Repeat Optivol 04/08/2015 and instructed her to call either Dr Bronson Ing or the office here for any worsening  symptoms.   Education given to check food labels for amount of daily salt intake and recommended no greater than 2000mg  daily.                        Forward note for review and any recommendations.   Copy of note sent to patient's primary care physician, primary cardiologist, and device following physician.  Rosalene Billings, RN, CCM  Sounds challenging. Thanks... Keep working with her. It may keep her out of the hospital.         Thanks!            ----- Message -----     From: Rosalene Billings, RN     Sent: 03/31/2015  2:52 PM      To: Thompson Grayer, MD     03/31/2015 1:53 PM

## 2015-03-31 NOTE — Telephone Encounter (Signed)
Spoke with pt and reminded pt of remote transmission that is due today. Pt verbalized understanding.   

## 2015-04-08 ENCOUNTER — Telehealth: Payer: Self-pay

## 2015-04-08 ENCOUNTER — Ambulatory Visit (INDEPENDENT_AMBULATORY_CARE_PROVIDER_SITE_OTHER): Payer: Medicare Other

## 2015-04-08 DIAGNOSIS — Z95810 Presence of automatic (implantable) cardiac defibrillator: Secondary | ICD-10-CM

## 2015-04-08 DIAGNOSIS — I5022 Chronic systolic (congestive) heart failure: Secondary | ICD-10-CM

## 2015-04-08 NOTE — Telephone Encounter (Signed)
Repeat ICM transmission received.  Attempted patient call and left message on voice mail for return call.

## 2015-04-08 NOTE — Telephone Encounter (Signed)
Pt returned call

## 2015-04-10 NOTE — Telephone Encounter (Signed)
Spoke with patient.

## 2015-04-10 NOTE — Progress Notes (Signed)
EPIC Encounter for ICM Monitoring  Patient Name: [name] is a 81 y.o. female Date: 04/10/2015 Primary Care Physican: Deloria Lair, MD Primary Cardiologist: Bronson Ing Electrophysiologist: Allred Dry Weight: 160 lbs       Bi-V Pacing >99%  In the past month, have you:  1. Gained more than 2 pounds in a day or more than 5 pounds in a week? no  2. Had changes in your medications (with verification of current medications)? no  3. Had more shortness of breath than is usual for you? no  4. Limited your activity because of shortness of breath? no  5. Not been able to sleep because of shortness of breath? no  6. Had increased swelling in your feet or ankles? no  7. Had symptoms of dehydration (dizziness, dry mouth, increased thirst, decreased urine output) no  8. Had changes in sodium restriction? no  9. Been compliant with medication? Yes   ICM trend: 04/08/2015   Follow-up plan: ICM clinic phone appointment on 05/07/2015. Corvue revealed slightly above baseline and education given to drink fluids, approximately up to 64 oz a day to stay hydrated.  She stated she does not feel like she has any fluid at this time.  She stated she did fall a few days ago and denied any significant injury but has overall soreness.  No changes today.    Copy of note sent to patient's primary care physician, primary cardiologist, and device following physician.  Rosalene Billings, RN, CCM 04/10/2015 2:45 PM

## 2015-04-28 ENCOUNTER — Encounter: Payer: Self-pay | Admitting: Internal Medicine

## 2015-05-07 ENCOUNTER — Ambulatory Visit (INDEPENDENT_AMBULATORY_CARE_PROVIDER_SITE_OTHER): Payer: Medicare Other

## 2015-05-07 DIAGNOSIS — Z95810 Presence of automatic (implantable) cardiac defibrillator: Secondary | ICD-10-CM | POA: Diagnosis not present

## 2015-05-07 DIAGNOSIS — I5023 Acute on chronic systolic (congestive) heart failure: Secondary | ICD-10-CM

## 2015-05-07 NOTE — Progress Notes (Signed)
EPIC Encounter for ICM Monitoring  Patient Name: [name] is a 81 y.o. female Date: 05/07/2015 Primary Care Physican: Deloria Lair, MD Primary Cardiologist: Bronson Ing Electrophysiologist: Allred Dry Weight: 160 lbs       Bi-V Pacing >96.1%  In the past month, have you:  1. Gained more than 2 pounds in a day or more than 5 pounds in a week? no  2. Had changes in your medications (with verification of current medications)? no  3. Had more shortness of breath than is usual for you? no  4. Limited your activity because of shortness of breath? no  5. Not been able to sleep because of shortness of breath? no  6. Had increased swelling in your feet or ankles? no  7. Had symptoms of dehydration (dizziness, dry mouth, increased thirst, decreased urine output) no  8. Had changes in sodium restriction? no  9. Been compliant with medication? No, Torsemide 20 mg 3 tablets (60mg ) prescribed daily but she only takes 1-2 tablets a day.  Education given on importance of medication compliance which helps to decrease hospitalization for HF.     ICM trend: 05/07/2015    Follow-up plan: ICM clinic phone appointment on 05/13/2015 (repeat transmission).   Optivol daily thoracic impedance trending below baseline ~04/14/2015 to 05/07/2015 suggesting fluid retention.  She denied any HF symptoms.  Weight stable.  Recommended she take Torsemide as prescribed which is 60 mg daily and will repeat transmission next week.  Education given to call if she experiences any nausea, vomiting, cramping, dizziness, weakness, heart racing, low BP or any new symptoms.  Reviewed importance of following low salt diet, limit of 2000mg  daily.  Reviewed typical foods she eats on daily basis which is low sodium crackers, some sweets, low sodium bacon and she sometimes adds salts to her foods.  Patient reference material mailed, Low Sodium Eating Plan.   Advised she is due for 6 month f/u with Dr Bronson Ing and  to call his office today to make an appointment.    Copy of note sent to patient's primary care physician, primary cardiologist, and device following physician.  Rosalene Billings, RN, CCM 05/07/2015 11:09 AM

## 2015-05-13 ENCOUNTER — Ambulatory Visit (INDEPENDENT_AMBULATORY_CARE_PROVIDER_SITE_OTHER): Payer: Medicare Other

## 2015-05-13 ENCOUNTER — Telehealth: Payer: Self-pay | Admitting: Nurse Practitioner

## 2015-05-13 DIAGNOSIS — I5023 Acute on chronic systolic (congestive) heart failure: Secondary | ICD-10-CM

## 2015-05-13 DIAGNOSIS — Z95810 Presence of automatic (implantable) cardiac defibrillator: Secondary | ICD-10-CM

## 2015-05-13 NOTE — Progress Notes (Signed)
EPIC Encounter for ICM Monitoring  Patient Name: [name] is a 81 y.o. female Date: 05/13/2015 Primary Care Physican: Deloria Lair, MD Primary Cardiologist: Bronson Ing Electrophysiologist: Allred Dry Weight: 160 lbs  Bi-V Pacing 97.2%       In the past month, have you:  1. Gained more than 2 pounds in a day or more than 5 pounds in a week? no  2. Had changes in your medications (with verification of current medications)? no  3. Had more shortness of breath than is usual for you? no  4. Limited your activity because of shortness of breath? no  5. Not been able to sleep because of shortness of breath? no  6. Had increased swelling in your feet or ankles? no  7. Had symptoms of dehydration (dizziness, dry mouth, increased thirst, decreased urine output) no  8. Had changes in sodium restriction? no  9. Been compliant with medication? Yes   ICM trend:   Follow-up plan: ICM clinic phone appointment 06/15/2015.  Optivol thoracic impedance showed back to baseline after patient has been taking the Torsemide as prescribed since last transmission 05/07/2015.  Patient stated she is feeling better.  She has appointment with Dr Bronson Ing on 06/15/2015.  She denied any HF symptoms today.  She continues to eat foods high in sodium and have discussed with her several occasions the importance of limiting salt intake.   No changes today.    Copy of note sent to patient's primary care physician, primary cardiologist, and device following physician.  Rosalene Billings, RN, CCM 05/13/2015 3:58 PM

## 2015-05-13 NOTE — Telephone Encounter (Signed)
Error

## 2015-05-20 ENCOUNTER — Ambulatory Visit (INDEPENDENT_AMBULATORY_CARE_PROVIDER_SITE_OTHER): Payer: Medicare Other

## 2015-05-20 ENCOUNTER — Telehealth: Payer: Self-pay

## 2015-05-20 DIAGNOSIS — Z95810 Presence of automatic (implantable) cardiac defibrillator: Secondary | ICD-10-CM

## 2015-05-20 DIAGNOSIS — I5023 Acute on chronic systolic (congestive) heart failure: Secondary | ICD-10-CM

## 2015-05-20 NOTE — Telephone Encounter (Signed)
Call back to patient.  Patient stated she has lost 7 lbs in a week and feeling dizzy.  She stated she visited Dr Scotty Court on Tuesday, 05/19/2015 and he advised her to increase fluids, not to take Torsemide yesterday or today.  She stated she still not feeling well.  Requested she send transmission.    Returned patient call.

## 2015-05-20 NOTE — Progress Notes (Addendum)
EPIC Encounter for ICM Monitoring  Patient Name: [name] is a 81 y.o. female Date: 05/20/2015 Primary Care Physican: Deloria Lair, MD Primary Cardiologist: Bronson Ing Electrophysiologist: Allred Dry Weight: 158 lbs   Bi-V Pacing 97.2%      In the past month, have you:  1. Gained more than 2 pounds in a day or more than 5 pounds in a week? no  2. Had changes in your medications (with verification of current medications)? no  3. Had more shortness of breath than is usual for you? no  4. Limited your activity because of shortness of breath? no  5. Not been able to sleep because of shortness of breath? no  6. Had increased swelling in your feet or ankles? no  7. Had symptoms of dehydration (dizziness, dry mouth, increased thirst, decreased urine output) no  8. Had changes in sodium restriction? no  9. Been compliant with medication? Yes   ICM trend:   Follow-up plan: ICM clinic phone appointment 06/15/2015.  Optivol thoracic impedance trending above baseline suggesting dryness.  She reported losing 7 lbs in last week, dizziness, and generally not feeling well.  She visited Dr Scotty Court on 05/19/2015 for symptoms.  He advised her to hold Torsemide 11/22 and 11/23.  She stated she is not sure when she should resume Torsemide.  Advised would speak with physician in the office regarding symptoms.       Reviewed Optivol and current symptoms with Dr Curt Bears, DOD.  He reported she may resume Torsemide 20 mg 2 tablets on Friday, 11/25 and Torsemide 30 mg 3 tablets (her prescribed dose) on Saturday if she is feeling better, increase fluids and go to ER if she does not improve or she has worsening symptoms or new symptoms.    Call to patient.  Advised to resume Torsemide 20mg  2 tablets on Friday 05/22/2015 and Torsemide 20 mg 3 tablets on Friday, 05/23/2015 (her normally prescribed dosage).  She should increase her to fluids, eat foods high in Potassium such as bananas and to  call Dr Court Joy office on Monday, 05/25/2015 to check if she can get an earlier appointment then 06/15/2015.  Advised her to call 911 if her symptoms worsen, do not improve or she had new symptoms such as nausea, vomiting, heart palpitations or racing, fainting.  She verbalized understanding.    Education given to discuss Torsemide dosage with Dr Bronson Ing at next appointment.    Copy of note sent to patient's primary care physician, primary cardiologist, and device following physician.  Rosalene Billings, RN, CCM 05/20/2015 4:50 PM

## 2015-05-28 ENCOUNTER — Other Ambulatory Visit: Payer: Self-pay | Admitting: Cardiology

## 2015-06-04 ENCOUNTER — Emergency Department (HOSPITAL_COMMUNITY): Payer: Medicare Other

## 2015-06-04 ENCOUNTER — Encounter (HOSPITAL_COMMUNITY): Payer: Self-pay | Admitting: *Deleted

## 2015-06-04 ENCOUNTER — Telehealth: Payer: Self-pay | Admitting: Cardiovascular Disease

## 2015-06-04 ENCOUNTER — Observation Stay (HOSPITAL_COMMUNITY)
Admission: EM | Admit: 2015-06-04 | Discharge: 2015-06-06 | Disposition: A | Discharge status: Home / Self Care | Payer: Medicare Other | Attending: Internal Medicine | Admitting: Internal Medicine

## 2015-06-04 DIAGNOSIS — F329 Major depressive disorder, single episode, unspecified: Secondary | ICD-10-CM | POA: Diagnosis not present

## 2015-06-04 DIAGNOSIS — N189 Chronic kidney disease, unspecified: Secondary | ICD-10-CM | POA: Diagnosis not present

## 2015-06-04 DIAGNOSIS — F419 Anxiety disorder, unspecified: Secondary | ICD-10-CM | POA: Insufficient documentation

## 2015-06-04 DIAGNOSIS — I5042 Chronic combined systolic (congestive) and diastolic (congestive) heart failure: Secondary | ICD-10-CM | POA: Diagnosis not present

## 2015-06-04 DIAGNOSIS — R531 Weakness: Secondary | ICD-10-CM | POA: Diagnosis not present

## 2015-06-04 DIAGNOSIS — E785 Hyperlipidemia, unspecified: Secondary | ICD-10-CM | POA: Insufficient documentation

## 2015-06-04 DIAGNOSIS — Z79899 Other long term (current) drug therapy: Secondary | ICD-10-CM | POA: Diagnosis not present

## 2015-06-04 DIAGNOSIS — I2510 Atherosclerotic heart disease of native coronary artery without angina pectoris: Secondary | ICD-10-CM | POA: Diagnosis not present

## 2015-06-04 DIAGNOSIS — I729 Aneurysm of unspecified site: Secondary | ICD-10-CM | POA: Diagnosis not present

## 2015-06-04 DIAGNOSIS — R42 Dizziness and giddiness: Secondary | ICD-10-CM | POA: Diagnosis present

## 2015-06-04 DIAGNOSIS — I5022 Chronic systolic (congestive) heart failure: Secondary | ICD-10-CM | POA: Diagnosis present

## 2015-06-04 DIAGNOSIS — K219 Gastro-esophageal reflux disease without esophagitis: Secondary | ICD-10-CM | POA: Insufficient documentation

## 2015-06-04 DIAGNOSIS — E119 Type 2 diabetes mellitus without complications: Secondary | ICD-10-CM | POA: Insufficient documentation

## 2015-06-04 DIAGNOSIS — M7542 Impingement syndrome of left shoulder: Secondary | ICD-10-CM | POA: Diagnosis not present

## 2015-06-04 DIAGNOSIS — Z794 Long term (current) use of insulin: Secondary | ICD-10-CM | POA: Insufficient documentation

## 2015-06-04 DIAGNOSIS — G629 Polyneuropathy, unspecified: Secondary | ICD-10-CM | POA: Insufficient documentation

## 2015-06-04 DIAGNOSIS — R7989 Other specified abnormal findings of blood chemistry: Secondary | ICD-10-CM | POA: Diagnosis not present

## 2015-06-04 DIAGNOSIS — Z95810 Presence of automatic (implantable) cardiac defibrillator: Secondary | ICD-10-CM | POA: Diagnosis present

## 2015-06-04 DIAGNOSIS — G894 Chronic pain syndrome: Secondary | ICD-10-CM | POA: Diagnosis present

## 2015-06-04 DIAGNOSIS — Z87891 Personal history of nicotine dependence: Secondary | ICD-10-CM | POA: Insufficient documentation

## 2015-06-04 DIAGNOSIS — N183 Chronic kidney disease, stage 3 unspecified: Secondary | ICD-10-CM | POA: Diagnosis present

## 2015-06-04 DIAGNOSIS — I255 Ischemic cardiomyopathy: Secondary | ICD-10-CM | POA: Insufficient documentation

## 2015-06-04 DIAGNOSIS — D696 Thrombocytopenia, unspecified: Secondary | ICD-10-CM | POA: Diagnosis present

## 2015-06-04 DIAGNOSIS — R778 Other specified abnormalities of plasma proteins: Secondary | ICD-10-CM | POA: Diagnosis present

## 2015-06-04 LAB — CBC WITH DIFFERENTIAL/PLATELET
BASOS ABS: 0 10*3/uL (ref 0.0–0.1)
Basophils Relative: 1 %
EOS PCT: 5 %
Eosinophils Absolute: 0.4 10*3/uL (ref 0.0–0.7)
HCT: 40.4 % (ref 36.0–46.0)
Hemoglobin: 13.4 g/dL (ref 12.0–15.0)
LYMPHS ABS: 1.1 10*3/uL (ref 0.7–4.0)
LYMPHS PCT: 15 %
MCH: 28.6 pg (ref 26.0–34.0)
MCHC: 33.2 g/dL (ref 30.0–36.0)
MCV: 86.1 fL (ref 78.0–100.0)
MONO ABS: 0.6 10*3/uL (ref 0.1–1.0)
Monocytes Relative: 8 %
Neutro Abs: 5.1 10*3/uL (ref 1.7–7.7)
Neutrophils Relative %: 71 %
PLATELETS: 139 10*3/uL — AB (ref 150–400)
RBC: 4.69 MIL/uL (ref 3.87–5.11)
RDW: 14.9 % (ref 11.5–15.5)
WBC: 7.2 10*3/uL (ref 4.0–10.5)

## 2015-06-04 LAB — COMPREHENSIVE METABOLIC PANEL
ALT: 19 U/L (ref 14–54)
ANION GAP: 13 (ref 5–15)
AST: 26 U/L (ref 15–41)
Albumin: 4.2 g/dL (ref 3.5–5.0)
Alkaline Phosphatase: 104 U/L (ref 38–126)
BUN: 31 mg/dL — ABNORMAL HIGH (ref 6–20)
CHLORIDE: 93 mmol/L — AB (ref 101–111)
CO2: 33 mmol/L — ABNORMAL HIGH (ref 22–32)
Calcium: 9.4 mg/dL (ref 8.9–10.3)
Creatinine, Ser: 1.56 mg/dL — ABNORMAL HIGH (ref 0.44–1.00)
GFR, EST AFRICAN AMERICAN: 35 mL/min — AB (ref >60)
GFR, EST NON AFRICAN AMERICAN: 30 mL/min — AB (ref >60)
Glucose, Bld: 211 mg/dL — ABNORMAL HIGH (ref 65–99)
POTASSIUM: 4.1 mmol/L (ref 3.5–5.1)
Sodium: 139 mmol/L (ref 135–145)
Total Bilirubin: 2.1 mg/dL — ABNORMAL HIGH (ref 0.3–1.2)
Total Protein: 7.9 g/dL (ref 6.5–8.1)

## 2015-06-04 LAB — I-STAT TROPONIN, ED: TROPONIN I, POC: 0.21 ng/mL — AB (ref 0.00–0.08)

## 2015-06-04 LAB — TROPONIN I: Troponin I: 0.38 ng/mL — ABNORMAL HIGH (ref <0.031)

## 2015-06-04 LAB — GLUCOSE, CAPILLARY: GLUCOSE-CAPILLARY: 296 mg/dL — AB (ref 65–99)

## 2015-06-04 MED ORDER — ONDANSETRON HCL 4 MG/2ML IJ SOLN: 4.0000 mg | Freq: Four times a day (QID) | INTRAMUSCULAR | Status: DC | PRN

## 2015-06-04 MED ORDER — ALPRAZOLAM 0.5 MG PO TABS
0.5000 mg | ORAL_TABLET | Freq: Every evening | ORAL | Status: DC | PRN
  Administered 2015-06-04 – 2015-06-05 (×2): 0.5 mg via ORAL
  Filled 2015-06-04 (×2): qty 1

## 2015-06-04 MED ORDER — INSULIN GLARGINE 100 UNIT/ML ~~LOC~~ SOLN
30.0000 [IU] | Freq: Every day | SUBCUTANEOUS | Status: DC
  Administered 2015-06-05 (×2): 30 [IU] via SUBCUTANEOUS
  Filled 2015-06-04 (×4): qty 0.3

## 2015-06-04 MED ORDER — HEPARIN SODIUM (PORCINE) 5000 UNIT/ML IJ SOLN
5000.0000 [IU] | Freq: Three times a day (TID) | INTRAMUSCULAR | Status: DC
  Administered 2015-06-04 – 2015-06-06 (×5): 5000 [IU] via SUBCUTANEOUS
  Filled 2015-06-04 (×5): qty 1

## 2015-06-04 MED ORDER — HYDROMORPHONE HCL 4 MG PO TABS
4.0000 mg | ORAL_TABLET | Freq: Four times a day (QID) | ORAL | Status: DC | PRN
  Administered 2015-06-05 (×2): 4 mg via ORAL
  Filled 2015-06-04 (×3): qty 1

## 2015-06-04 MED ORDER — CITALOPRAM HYDROBROMIDE 20 MG PO TABS
20.0000 mg | ORAL_TABLET | Freq: Every day | ORAL | Status: DC
  Administered 2015-06-04 – 2015-06-05 (×2): 20 mg via ORAL
  Filled 2015-06-04 (×2): qty 1

## 2015-06-04 MED ORDER — INSULIN ASPART 100 UNIT/ML ~~LOC~~ SOLN
0.0000 [IU] | Freq: Every day | SUBCUTANEOUS | Status: DC
  Administered 2015-06-04: 3 [IU] via SUBCUTANEOUS

## 2015-06-04 MED ORDER — SODIUM CHLORIDE 0.9 % IJ SOLN
3.0000 mL | Freq: Two times a day (BID) | INTRAMUSCULAR | Status: DC
  Administered 2015-06-04 – 2015-06-05 (×2): 3 mL via INTRAVENOUS
  Administered 2015-06-05: 23:00:00 via INTRAVENOUS

## 2015-06-04 MED ORDER — CARVEDILOL 3.125 MG PO TABS
3.1250 mg | ORAL_TABLET | Freq: Two times a day (BID) | ORAL | Status: DC
  Administered 2015-06-05 – 2015-06-06 (×3): 3.125 mg via ORAL
  Filled 2015-06-04 (×3): qty 1

## 2015-06-04 MED ORDER — INSULIN ASPART 100 UNIT/ML ~~LOC~~ SOLN
0.0000 [IU] | Freq: Three times a day (TID) | SUBCUTANEOUS | Status: DC
  Administered 2015-06-05 (×3): 4 [IU] via SUBCUTANEOUS
  Administered 2015-06-06: 3 [IU] via SUBCUTANEOUS

## 2015-06-04 MED ORDER — ONDANSETRON HCL 4 MG PO TABS: 4.0000 mg | ORAL_TABLET | Freq: Four times a day (QID) | ORAL | Status: DC | PRN

## 2015-06-04 MED ORDER — FAMOTIDINE 20 MG PO TABS
10.0000 mg | ORAL_TABLET | Freq: Every day | ORAL | Status: DC
  Administered 2015-06-05 – 2015-06-06 (×2): 10 mg via ORAL
  Filled 2015-06-04 (×2): qty 1

## 2015-06-04 NOTE — ED Provider Notes (Signed)
CSN: [id number]     Arrival date & time 06/04/15  1715 History   First MD Initiated Contact with Patient 06/04/15 1728     Chief Complaint  Patient presents with  . Dizziness     (Consider location/radiation/quality/duration/timing/severity/associated sxs/prior Treatment) Patient is a 81 y.o. female presenting with dizziness. The history is provided by the patient (The patient complains that for last few days she's felt, weak run down somewhat dizzy.).  Dizziness Quality:  Lightheadedness Severity:  Mild Onset quality:  Sudden Timing:  Constant Progression:  Waxing and waning Chronicity:  New Context: not when bending over   Associated symptoms: no chest pain, no diarrhea and no headaches     Past Medical History  Diagnosis Date  . Type 2 diabetes mellitus ([place])   . Anxiety disorder   . GERD (gastroesophageal reflux disease)   . Hyperlipidemia     H/o GI intolerance to Lipitor  . Ischemic cardiomyopathy     s/p Medtronic BiV ICD 2006  . Systolic and diastolic CHF, chronic (HCC)     echo 03/2012 EF 123456, mod diastolic dysfunction, mod MR, mod LAE, mod-severe TR, PASP 48mmHg  . Impingement syndrome of left shoulder   . Aneurysm ([place])     Apical; previously on Coumadin  . Chronic renal insufficiency     Creatinine 1.17 January 2010  . Peripheral polyneuropathy (HCC)     Refractory to several medications  . Coronary artery disease     Cath 02/15/12 revealed RCA as culprit lesion w/ occlusion at site of prior stent, patent LAD stent, chronic LCx and Diagonal dz; Medical therapy   . Depression    Past Surgical History  Procedure Laterality Date  . Abdominal hysterectomy    . Cardiac defibrillator placement  05/28/2009    Medtronic ICD placed by Dr. Caryl Comes secondary to ICM/CHF, [phone] Fidelis lead fracture with ICD storm 2010 requiring new RV lead placement  . Implantable cardioverter defibrillator generator change  09/26/13    BiV ICD generator change by Dr Rayann Heman - [facility] [place]  CRT-D  . Cholecystectomy    . Eye surgery    . Left heart catheterization with coronary angiogram N/A 02/15/2012    Procedure: LEFT HEART CATHETERIZATION WITH CORONARY ANGIOGRAM;  Surgeon: Peter M Martinique, MD;  Location: [facility] CATH LAB;  Service: Cardiovascular;  Laterality: N/A;  . Right heart catheterization N/A 04/27/2012    Procedure: RIGHT HEART CATH;  Surgeon: Larey Dresser, MD;  Location: [facility] CATH LAB;  Service: Cardiovascular;  Laterality: N/A;  . Implantable cardioverter defibrillator generator change N/A 09/26/2013    Procedure: IMPLANTABLE CARDIOVERTER DEFIBRILLATOR GENERATOR CHANGE;  Surgeon: Coralyn Mark, MD;  Location: [facility] CATH LAB;  Service: Cardiovascular;  Laterality: N/A;   Family History  Problem Relation Age of Onset  . Coronary artery disease Neg Hx    Social History  Substance Use Topics  . Smoking status: Former Smoker -- 0.80 packs/day for 30 years    Types: Cigarettes    Start date: 04/05/1957    Quit date: 06/27/2002  . Smokeless tobacco: Never Used  . Alcohol Use: No   OB History    No data available     Review of Systems  Constitutional: Negative for appetite change and fatigue.  HENT: Negative for congestion, ear discharge and sinus pressure.   Eyes: Negative for discharge.  Respiratory: Negative for cough.   Cardiovascular: Negative for chest pain.  Gastrointestinal: Negative for abdominal pain and diarrhea.  Genitourinary: Negative for frequency  and hematuria.  Musculoskeletal: Negative for back pain.  Skin: Negative for rash.  Neurological: Positive for dizziness. Negative for seizures and headaches.  Psychiatric/Behavioral: Negative for hallucinations.      Allergies  Augmentin; Metformin; and Sulfonamide derivatives  Home Medications   Prior to Admission medications   Medication Sig Start Date End Date Taking? Authorizing Provider  ALPRAZolam Duanne Moron) 0.5 MG tablet Take 0.5 mg by mouth at bedtime as needed for anxiety.   Yes Historical  Provider, MD  Ascorbic Acid (VITAMIN C) 100 MG tablet Take 100 mg by mouth daily.   Yes Historical Provider, MD  carvedilol (COREG) 3.125 MG tablet TAKE ONE TABLET BY MOUTH TWICE DAILY WITH A MEAL 05/28/15  Yes Herminio Commons, MD  citalopram (CELEXA) 20 MG tablet Take 20 mg by mouth daily.   Yes Historical Provider, MD  HYDROmorphone (DILAUDID) 4 MG tablet Take 6 mg by mouth every 6 (six) hours as needed for severe pain.    Yes Historical Provider, MD  insulin glargine (LANTUS) 100 UNIT/ML injection Inject 50-60 Units into the skin 2 (two) times daily. 60 units in the morning and 50 units at night 02/20/12  Yes Kinney, PA-C  OXYGEN-HELIUM IN 2 L as needed.   Yes Historical Provider, MD  ranitidine (ZANTAC) 150 MG tablet Take 150 mg by mouth 2 (two) times daily.    Yes Historical Provider, MD  spironolactone (ALDACTONE) 25 MG tablet TAKE ONE TABLET BY MOUTH ONCE DAILY IN THE MORNING 03/26/15  Yes Herminio Commons, MD  torsemide (DEMADEX) 20 MG tablet Take three tablets (60 mg) by mouth once daily   Yes Historical Provider, MD  nitroGLYCERIN (NITROSTAT) 0.4 MG SL tablet Place 1 tablet (0.4 mg total) under the tongue every 5 (five) minutes as needed for chest pain. 06/14/13   Minus Breeding, MD   BP 127/60 mmHg  Pulse 75  Temp(Src) 98.1 F (36.7 C) (Oral)  Resp 17  Ht 5\' 6"  (1.676 m)  Wt 164 lb (74.39 kg)  BMI 26.48 kg/m2  SpO2 94% Physical Exam  Constitutional: She is oriented to person, place, and time. She appears well-developed.  HENT:  Head: Normocephalic.  Eyes: Conjunctivae and EOM are normal. No scleral icterus.  Neck: Neck supple. No thyromegaly present.  Cardiovascular: Normal rate and regular rhythm.  Exam reveals no gallop and no friction rub.   No murmur heard. Pulmonary/Chest: No stridor. She has no wheezes. She has no rales. She exhibits no tenderness.  Abdominal: She exhibits no distension. There is no tenderness. There is no rebound.  Musculoskeletal: Normal  range of motion. She exhibits no edema.  Lymphadenopathy:    She has no cervical adenopathy.  Neurological: She is oriented to person, place, and time. She exhibits normal muscle tone. Coordination normal.  Skin: No rash noted. No erythema.  Psychiatric: She has a normal mood and affect. Her behavior is normal.    ED Course  Procedures (including critical care time) Labs Review Labs Reviewed  CBC WITH DIFFERENTIAL/PLATELET - Abnormal; Notable for the following:    Platelets 139 (*)    All other components within normal limits  COMPREHENSIVE METABOLIC PANEL - Abnormal; Notable for the following:    Chloride 93 (*)    CO2 33 (*)    Glucose, Bld 211 (*)    BUN 31 (*)    Creatinine, Ser 1.56 (*)    Total Bilirubin 2.1 (*)    GFR calc non Af Amer 30 (*)  GFR calc Af Amer 35 (*)    All other components within normal limits  I-STAT TROPOININ, ED - Abnormal; Notable for the following:    Troponin i, poc 0.21 (*)    All other components within normal limits    Imaging Review Dg Chest 2 View  06/04/2015  CLINICAL DATA:  Dizziness and dehydration with intermittent chest pain EXAM: CHEST - 2 VIEW COMPARISON:  09/30/2014 FINDINGS: Cardiac shadow is stable. A defibrillator is again noted as well as epicardial leads. The lungs are clear bilaterally. No acute bony abnormality is seen. IMPRESSION: No active disease. Electronically Signed   By: Inez Catalina M.D.   On: 06/04/2015 18:24   Ct Head Wo Contrast  06/04/2015  CLINICAL DATA:  Dizziness for 3 weeks EXAM: CT HEAD WITHOUT CONTRAST TECHNIQUE: Contiguous axial images were obtained from the base of the skull through the vertex without intravenous contrast. COMPARISON:  None. FINDINGS: Bony calvarium is intact. Mild atrophic changes are seen. The vague area decreased attenuation is noted in the centrum semi ovale on the left best seen on image number 18 series 2. This likely represents chronic ischemic change. IMPRESSION: Chronic atrophic and  ischemic change.  No acute abnormality noted. Electronically Signed   By: Inez Catalina M.D.   On: 06/04/2015 18:30   I have personally reviewed and evaluated these images and lab results as part of my medical decision-making.   EKG Interpretation   Date/Time:  Thursday June 04 2015 17:31:40 EST Ventricular Rate:  72 PR Interval:  148 QRS Duration: 153 QT Interval:  441 QTC Calculation: 483 R Axis:   -57 Text Interpretation:  Atrial-sensed ventricular-paced complexes No further  rhythm analysis attempted due to paced rhythm RBBB and LAFB Inferior  infarct, acute (RCA) Consider anterolateral infarct Probable RV  involvement, suggest recording right precordial leads Baseline wander in  lead(s) III aVL aVF Confirmed by Wisdom Seybold  MD, Broadus John [id number]) on 06/04/2015  8:46:00 PM      MDM   Final diagnoses:  Elevated troponin    Patient with unknown reason for weakness and lightheadedness. Labs show an elevated troponin. She has a history of coronary artery disease and congestive heart failure. Patient will be admitted get serial troponins and cardiology consult in the morning    Milton Ferguson, MD 06/04/15 2048

## 2015-06-04 NOTE — Telephone Encounter (Signed)
Mrs. terilee quast stating that she is "feeling funny"  C/o pain off and on in her right arm.  States that she continues to stay dizzy.

## 2015-06-04 NOTE — ED Notes (Signed)
Pt states dehydration just before Thanksgiving and states she has been dizzy since Thanksgiving, intermittent and worsening since then.. States intermittent tightness to the chest x 2 weeks also.

## 2015-06-04 NOTE — Telephone Encounter (Signed)
X several weeks, lost 7 lbs.  Staying dizzy.  No chest pain.  Pain in right arm off and on. No SOB other than normal.  Just don't feel normal for her.  States this is new for her.  Advised to have patient evaluated at ED in light of recent phone note and heart failure.  Recommend she go to [facility].  She verbalized understanding.

## 2015-06-04 NOTE — H&P (Signed)
[facility] History and Physical  [name] [id number] DOB: March 22, 1934    PCP:   Deloria Lair, MD   Chief Complaint: weakness, found to have elevated troponins.   HPI: [name] is an 81 y.o. female with hx of DM2, anxiety disorder, HLD intolerant of statin, hx of ischemic CMP, s/p metronic BiV ICD 2006, depression, known CAD, s/p several cardiac stents, presented to the ER with non specific complaints of generalized weakness.  She denied chest pain or SOB.  Evlaution in the ER included normal WBC, Cr of 1.5, and normal electrolytes, with BS of 200's and Platelet count of 137K.  Hospitalist was asked to admit her for cardiac r/out.   Her EKG showed paced rhythm with bifascicular block.   She was found to have hx of CKD with her Cr in the past elevated to 1.9.   Rewiew of Systems:  Constitutional: Negative for malaise, fever and chills. No significant weight loss or weight gain Eyes: Negative for eye pain, redness and discharge, diplopia, visual changes, or flashes of light. ENMT: Negative for ear pain, hoarseness, nasal congestion, sinus pressure and sore throat. No headaches; tinnitus, drooling, or problem swallowing. Cardiovascular: Negative for chest pain, palpitations, diaphoresis, dyspnea and peripheral edema. ; No orthopnea, PND Respiratory: Negative for cough, hemoptysis, wheezing and stridor. No pleuritic chestpain. Gastrointestinal: Negative for nausea, vomiting, diarrhea, constipation, abdominal pain, melena, blood in stool, hematemesis, jaundice and rectal bleeding.    Genitourinary: Negative for frequency, dysuria, incontinence,flank pain and hematuria; Musculoskeletal: Negative for back pain and neck pain. Negative for swelling and trauma.;  Skin: . Negative for pruritus, rash, abrasions, bruising and skin lesion.; ulcerations Neuro: Negative for headache, lightheadedness and neck stiffness. Negative for weakness, altered level of consciousness  , altered mental status, burning feet, involuntary movement, seizure and syncope.  Psych: negative for anxiety, depression, insomnia, tearfulness, panic attacks, hallucinations, paranoia, suicidal or homicidal ideation    Past Medical History  Diagnosis Date  . Type 2 diabetes mellitus ([place])   . Anxiety disorder   . GERD (gastroesophageal reflux disease)   . Hyperlipidemia     H/o GI intolerance to Lipitor  . Ischemic cardiomyopathy     s/p Medtronic BiV ICD 2006  . Systolic and diastolic CHF, chronic (HCC)     echo 03/2012 EF 123456, mod diastolic dysfunction, mod MR, mod LAE, mod-severe TR, PASP 91mmHg  . Impingement syndrome of left shoulder   . Aneurysm ([place])     Apical; previously on Coumadin  . Chronic renal insufficiency     Creatinine 1.17 January 2010  . Peripheral polyneuropathy (HCC)     Refractory to several medications  . Coronary artery disease     Cath 02/15/12 revealed RCA as culprit lesion w/ occlusion at site of prior stent, patent LAD stent, chronic LCx and Diagonal dz; Medical therapy   . Depression     Past Surgical History  Procedure Laterality Date  . Abdominal hysterectomy    . Cardiac defibrillator placement  05/28/2009    Medtronic ICD placed by Dr. Caryl Comes secondary to ICM/CHF, [phone] Fidelis lead fracture with ICD storm 2010 requiring new RV lead placement  . Implantable cardioverter defibrillator generator change  09/26/13    BiV ICD generator change by Dr Rayann Heman - MDT [place] CRT-D  . Cholecystectomy    . Eye surgery    . Left heart catheterization with coronary angiogram N/A 02/15/2012    Procedure: LEFT HEART CATHETERIZATION WITH CORONARY ANGIOGRAM;  Surgeon: Ander Slade  Martinique, MD;  Location: [facility] CATH LAB;  Service: Cardiovascular;  Laterality: N/A;  . Right heart catheterization N/A 04/27/2012    Procedure: RIGHT HEART CATH;  Surgeon: Larey Dresser, MD;  Location: [facility] CATH LAB;  Service: Cardiovascular;  Laterality: N/A;  . Implantable cardioverter  defibrillator generator change N/A 09/26/2013    Procedure: IMPLANTABLE CARDIOVERTER DEFIBRILLATOR GENERATOR CHANGE;  Surgeon: Coralyn Mark, MD;  Location: [facility] CATH LAB;  Service: Cardiovascular;  Laterality: N/A;    Medications:  HOME MEDS: Prior to Admission medications   Medication Sig Start Date End Date Taking? Authorizing Provider  ALPRAZolam Duanne Moron) 0.5 MG tablet Take 0.5 mg by mouth at bedtime as needed for anxiety.   Yes Historical Provider, MD  Ascorbic Acid (VITAMIN C) 100 MG tablet Take 100 mg by mouth daily.   Yes Historical Provider, MD  carvedilol (COREG) 3.125 MG tablet TAKE ONE TABLET BY MOUTH TWICE DAILY WITH A MEAL 05/28/15  Yes Herminio Commons, MD  citalopram (CELEXA) 20 MG tablet Take 20 mg by mouth daily.   Yes Historical Provider, MD  HYDROmorphone (DILAUDID) 4 MG tablet Take 6 mg by mouth every 6 (six) hours as needed for severe pain.    Yes Historical Provider, MD  insulin glargine (LANTUS) 100 UNIT/ML injection Inject 50-60 Units into the skin 2 (two) times daily. 60 units in the morning and 50 units at night 02/20/12  Yes [place], PA-C  OXYGEN-HELIUM IN 2 L as needed.   Yes Historical Provider, MD  ranitidine (ZANTAC) 150 MG tablet Take 150 mg by mouth 2 (two) times daily.    Yes Historical Provider, MD  spironolactone (ALDACTONE) 25 MG tablet TAKE ONE TABLET BY MOUTH ONCE DAILY IN THE MORNING 03/26/15  Yes Herminio Commons, MD  torsemide (DEMADEX) 20 MG tablet Take three tablets (60 mg) by mouth once daily   Yes Historical Provider, MD  nitroGLYCERIN (NITROSTAT) 0.4 MG SL tablet Place 1 tablet (0.4 mg total) under the tongue every 5 (five) minutes as needed for chest pain. 06/14/13   Minus Breeding, MD     Allergies:  Allergies  Allergen Reactions  . Augmentin [Amoxicillin-Pot Clavulanate]     Abdominal pain  . Metformin     Upset stomach  . Sulfonamide Derivatives     swelling    Social History:   reports that she quit smoking about 12 years  ago. Her smoking use included Cigarettes. She started smoking about 58 years ago. She has a 24 pack-year smoking history. She has never used smokeless tobacco. She reports that she does not drink alcohol. Her drug history is not on file.  Family History: Family History  Problem Relation Age of Onset  . Coronary artery disease Neg Hx      Physical Exam: Filed Vitals:   06/04/15 1730 06/04/15 1800 06/04/15 1830 06/04/15 1951  BP: 126/55 127/58 126/59 127/60  Pulse: 75 76 73 75  Temp: 98.1 F (36.7 C)     TempSrc: Oral     Resp: 19 15  17   Height: 5\' 6"  (1.676 m)     Weight: 74.39 kg (164 lb)     SpO2: 95% 90% 92% 94%   Blood pressure 127/60, pulse 75, temperature 98.1 F (36.7 C), temperature source Oral, resp. rate 17, height 5\' 6"  (1.676 m), weight 74.39 kg (164 lb), SpO2 94 %.  GEN:  Pleasant  patient lying in the stretcher in no acute distress; cooperative with exam. PSYCH:  alert and oriented  x4; does not appear anxious or depressed; affect is appropriate. HEENT: Mucous membranes pink and anicteric; PERRLA; EOM intact; no cervical lymphadenopathy nor thyromegaly or carotid bruit; no JVD; There were no stridor. Neck is very supple. Breasts:: Not examined CHEST WALL: No tenderness CHEST: Normal respiration, clear to auscultation bilaterally.  HEART: Regular rate and rhythm.  There are no murmur, rub, or gallops.   BACK: No kyphosis or scoliosis; no CVA tenderness ABDOMEN: soft and non-tender; no masses, no organomegaly, normal abdominal bowel sounds; no pannus; no intertriginous candida. There is no rebound and no distention. Rectal Exam: Not done EXTREMITIES: No bone or joint deformity; age-appropriate arthropathy of the hands and knees; no edema; no ulcerations.  There is no calf tenderness. Genitalia: not examined PULSES: 2+ and symmetric SKIN: Normal hydration no rash or ulceration CNS: Cranial nerves 2-12 grossly intact no focal lateralizing neurologic deficit.  Speech is  fluent; uvula elevated with phonation, facial symmetry and tongue midline. DTR are normal bilaterally, cerebella exam is intact, barbinski is negative and strengths are equaled bilaterally.  No sensory loss.   Labs on Admission:  Basic Metabolic Panel:  Recent Labs Lab 06/04/15 1756  NA 139  K 4.1  CL 93*  CO2 33*  GLUCOSE 211*  BUN 31*  CREATININE 1.56*  CALCIUM 9.4   Liver Function Tests:  Recent Labs Lab 06/04/15 1756  AST 26  ALT 19  ALKPHOS 104  BILITOT 2.1*  PROT 7.9  ALBUMIN 4.2   CBC:  Recent Labs Lab 06/04/15 1756  WBC 7.2  NEUTROABS 5.1  HGB 13.4  HCT 40.4  MCV 86.1  PLT 139*    Radiological Exams on Admission: Dg Chest 2 View  06/04/2015  CLINICAL DATA:  Dizziness and dehydration with intermittent chest pain EXAM: CHEST - 2 VIEW COMPARISON:  09/30/2014 FINDINGS: Cardiac shadow is stable. A defibrillator is again noted as well as epicardial leads. The lungs are clear bilaterally. No acute bony abnormality is seen. IMPRESSION: No active disease. Electronically Signed   By: Inez Catalina M.D.   On: 06/04/2015 18:24   Ct Head Wo Contrast  06/04/2015  CLINICAL DATA:  Dizziness for 3 weeks EXAM: CT HEAD WITHOUT CONTRAST TECHNIQUE: Contiguous axial images were obtained from the base of the skull through the vertex without intravenous contrast. COMPARISON:  None. FINDINGS: Bony calvarium is intact. Mild atrophic changes are seen. The vague area decreased attenuation is noted in the centrum semi ovale on the left best seen on image number 18 series 2. This likely represents chronic ischemic change. IMPRESSION: Chronic atrophic and ischemic change.  No acute abnormality noted. Electronically Signed   By: Inez Catalina M.D.   On: 06/04/2015 18:30    EKG: Independently reviewed.    Assessment/Plan Present on Admission:  . Elevated troponin I level . Chronic kidney disease . Diabetic neuropathy (HCC)  PLAN:  I am not convinced she has had an ACS, with no chest  pain, SOB, and minimal elevation of her troponin in the setting of CKD.  Her EKG wasn't helpful.  Given her elevation of her troponin, and hx of known CAD, will admit her for cardiac r/out.  I will continue her meds.  She is stable, full code, and will be admitted to [facility] service.  Thank you for allowing me to participate in her care.   Other plans as per orders.  Code Status: FULL CODE>    Orvan Falconer, MD. [facility] Pager [phone] 7pm to 7am.  06/04/2015, 9:31 PM

## 2015-06-05 ENCOUNTER — Observation Stay (HOSPITAL_BASED_OUTPATIENT_CLINIC_OR_DEPARTMENT_OTHER): Payer: Medicare Other

## 2015-06-05 ENCOUNTER — Telehealth: Payer: Self-pay | Admitting: *Deleted

## 2015-06-05 DIAGNOSIS — Z95810 Presence of automatic (implantable) cardiac defibrillator: Secondary | ICD-10-CM

## 2015-06-05 DIAGNOSIS — I5022 Chronic systolic (congestive) heart failure: Secondary | ICD-10-CM | POA: Diagnosis not present

## 2015-06-05 DIAGNOSIS — I509 Heart failure, unspecified: Secondary | ICD-10-CM

## 2015-06-05 DIAGNOSIS — R531 Weakness: Secondary | ICD-10-CM | POA: Diagnosis not present

## 2015-06-05 DIAGNOSIS — I248 Other forms of acute ischemic heart disease: Secondary | ICD-10-CM

## 2015-06-05 DIAGNOSIS — G894 Chronic pain syndrome: Secondary | ICD-10-CM | POA: Diagnosis not present

## 2015-06-05 DIAGNOSIS — I25118 Atherosclerotic heart disease of native coronary artery with other forms of angina pectoris: Secondary | ICD-10-CM | POA: Diagnosis not present

## 2015-06-05 DIAGNOSIS — I255 Ischemic cardiomyopathy: Secondary | ICD-10-CM

## 2015-06-05 DIAGNOSIS — I429 Cardiomyopathy, unspecified: Secondary | ICD-10-CM

## 2015-06-05 DIAGNOSIS — E119 Type 2 diabetes mellitus without complications: Secondary | ICD-10-CM | POA: Diagnosis not present

## 2015-06-05 DIAGNOSIS — D696 Thrombocytopenia, unspecified: Secondary | ICD-10-CM

## 2015-06-05 DIAGNOSIS — N183 Chronic kidney disease, stage 3 (moderate): Secondary | ICD-10-CM

## 2015-06-05 DIAGNOSIS — F329 Major depressive disorder, single episode, unspecified: Secondary | ICD-10-CM | POA: Diagnosis not present

## 2015-06-05 DIAGNOSIS — E1340 Other specified diabetes mellitus with diabetic neuropathy, unspecified: Secondary | ICD-10-CM

## 2015-06-05 DIAGNOSIS — I2510 Atherosclerotic heart disease of native coronary artery without angina pectoris: Secondary | ICD-10-CM | POA: Diagnosis not present

## 2015-06-05 DIAGNOSIS — R7989 Other specified abnormal findings of blood chemistry: Secondary | ICD-10-CM

## 2015-06-05 LAB — TROPONIN I
Troponin I: 0.28 ng/mL — ABNORMAL HIGH (ref <0.031)
Troponin I: 0.3 ng/mL — ABNORMAL HIGH (ref <0.031)

## 2015-06-05 LAB — CBC
HEMATOCRIT: 36.9 % (ref 36.0–46.0)
Hemoglobin: 12.2 g/dL (ref 12.0–15.0)
MCH: 28.6 pg (ref 26.0–34.0)
MCHC: 33.1 g/dL (ref 30.0–36.0)
MCV: 86.4 fL (ref 78.0–100.0)
Platelets: 130 10*3/uL — ABNORMAL LOW (ref 150–400)
RBC: 4.27 MIL/uL (ref 3.87–5.11)
RDW: 14.8 % (ref 11.5–15.5)
WBC: 5.5 10*3/uL (ref 4.0–10.5)

## 2015-06-05 LAB — GLUCOSE, CAPILLARY
GLUCOSE-CAPILLARY: 169 mg/dL — AB (ref 65–99)
GLUCOSE-CAPILLARY: 107 mg/dL — AB (ref 65–99)
GLUCOSE-CAPILLARY: 211 mg/dL — AB (ref 65–99)
Glucose-Capillary: 162 mg/dL — ABNORMAL HIGH (ref 65–99)

## 2015-06-05 LAB — BASIC METABOLIC PANEL
ANION GAP: 8 (ref 5–15)
BUN: 31 mg/dL — ABNORMAL HIGH (ref 6–20)
CHLORIDE: 99 mmol/L — AB (ref 101–111)
CO2: 32 mmol/L (ref 22–32)
Calcium: 8.9 mg/dL (ref 8.9–10.3)
Creatinine, Ser: 1.5 mg/dL — ABNORMAL HIGH (ref 0.44–1.00)
GFR, EST AFRICAN AMERICAN: 37 mL/min — AB (ref >60)
GFR, EST NON AFRICAN AMERICAN: 32 mL/min — AB (ref >60)
Glucose, Bld: 201 mg/dL — ABNORMAL HIGH (ref 65–99)
POTASSIUM: 4 mmol/L (ref 3.5–5.1)
SODIUM: 139 mmol/L (ref 135–145)

## 2015-06-05 LAB — VITAMIN B12: VITAMIN B 12: 207 pg/mL (ref 180–914)

## 2015-06-05 LAB — TSH: TSH: 3.752 u[IU]/mL (ref 0.350–4.500)

## 2015-06-05 MED ORDER — HYDROMORPHONE HCL 4 MG PO TABS: 4.0000 mg | ORAL_TABLET | ORAL | Status: DC | PRN

## 2015-06-05 MED ORDER — HYDROMORPHONE HCL 2 MG PO TABS
2.0000 mg | ORAL_TABLET | Freq: Four times a day (QID) | ORAL | Status: DC | PRN
  Administered 2015-06-05 – 2015-06-06 (×2): 2 mg via ORAL
  Filled 2015-06-05 (×3): qty 1

## 2015-06-05 MED ORDER — ASPIRIN 81 MG PO CHEW
81.0000 mg | CHEWABLE_TABLET | Freq: Every day | ORAL | Status: DC
Start: 2015-06-05 — End: 2015-06-06
  Administered 2015-06-05 – 2015-06-06 (×2): 81 mg via ORAL
  Filled 2015-06-05 (×2): qty 1

## 2015-06-05 MED ORDER — TORSEMIDE 20 MG PO TABS
20.0000 mg | ORAL_TABLET | Freq: Every day | ORAL | Status: DC
Start: 2015-06-05 — End: 2015-06-06
  Administered 2015-06-05 – 2015-06-06 (×2): 20 mg via ORAL
  Filled 2015-06-05 (×2): qty 1

## 2015-06-05 MED ORDER — HYDROMORPHONE HCL 4 MG PO TABS
4.0000 mg | ORAL_TABLET | Freq: Three times a day (TID) | ORAL | Status: DC
  Administered 2015-06-05 – 2015-06-06 (×3): 4 mg via ORAL
  Filled 2015-06-05 (×2): qty 1

## 2015-06-05 MED ORDER — SODIUM CHLORIDE 0.9 % IV SOLN
INTRAVENOUS | Status: DC
  Administered 2015-06-05 – 2015-06-06 (×2): via INTRAVENOUS

## 2015-06-05 MED ORDER — SPIRONOLACTONE 25 MG PO TABS
25.0000 mg | ORAL_TABLET | Freq: Every day | ORAL | Status: DC
  Administered 2015-06-05: 25 mg via ORAL
  Filled 2015-06-05: qty 1

## 2015-06-05 NOTE — Consult Note (Addendum)
CARDIOLOGY CONSULT NOTE   Patient ID: WETONA HOLLINGSWORTH MRN: [id number] DOB/AGE: 79/17/1935 79 y.o.  Admit Date: 06/04/2015 Referring Physician: TRH-Fisher Primary Physician: Deloria Lair, MD Consulting Cardiologist: Kate Sable MD Primary Cardiologist:Koneswaran, Jamesetta So MD Reason for Consultation: Positive Troponin  Clinical Summary Ms. Jarrell is a 81 y.o.female with known history of CAD with stenting to the LAD, and RCA,, ICM, s/p CRT-D (MERM-[id number] Viva XT CRT-D), last interrogated on 12/09/2014, with other history to include depression, CRI, DM and anxiety. She presented to the ER with complaints of dizziness and non-specific weakness.   On arrival to ER, BP 126/55, HR 75, O2 Sat 95%, Afebrile. Labs demonstrated elevated blood glucose, 211, Creatinine of 1.56, elevated Bili total 2.1, troponin of 0.21; 0.38; and 0.20 respectively. . CXR negative for CHF or pneumonia. EKG demonstrated paced rhythm with RBBB.   She states symptoms began around Thanksgiving when she was treated for dehydration. She has multiple somatic complaints of knee pain, lack of energy, neuralgia, but no chest pain or DOE. She states she would go for days without leaving the house.   Allergies  Allergen Reactions  . Augmentin [Amoxicillin-Pot Clavulanate]     Abdominal pain  . Metformin     Upset stomach  . Sulfonamide Derivatives     swelling    Medications Scheduled Medications: . carvedilol  3.125 mg Oral BID WC  . citalopram  20 mg Oral Daily  . famotidine  10 mg Oral Daily  . heparin  5,000 Units Subcutaneous 3 times per day  . insulin aspart  0-20 Units Subcutaneous TID WC  . insulin aspart  0-5 Units Subcutaneous QHS  . insulin glargine  30 Units Subcutaneous QHS  . sodium chloride  3 mL Intravenous Q12H    Infusions:    PRN Medications: ALPRAZolam, HYDROmorphone, ondansetron **OR** ondansetron (ZOFRAN) IV   Past Medical History  Diagnosis Date  . Type 2 diabetes  mellitus ([place])   . Anxiety disorder   . GERD (gastroesophageal reflux disease)   . Hyperlipidemia     H/o GI intolerance to Lipitor  . Ischemic cardiomyopathy     s/p Medtronic BiV ICD 2006  . Systolic and diastolic CHF, chronic (HCC)     echo 03/2012 EF 123456, mod diastolic dysfunction, mod MR, mod LAE, mod-severe TR, PASP 16mmHg  . Impingement syndrome of left shoulder   . Aneurysm ([place])     Apical; previously on Coumadin  . Chronic renal insufficiency     Creatinine 1.17 January 2010  . Peripheral polyneuropathy (HCC)     Refractory to several medications  . Coronary artery disease     Cath 02/15/12 revealed RCA as culprit lesion w/ occlusion at site of prior stent, patent LAD stent, chronic LCx and Diagonal dz; Medical therapy   . Depression     Past Surgical History  Procedure Laterality Date  . Abdominal hysterectomy    . Cardiac defibrillator placement  05/28/2009    Medtronic ICD placed by Dr. Caryl Comes secondary to ICM/CHF, [id number] Fidelis lead fracture with ICD storm 2010 requiring new RV lead placement  . Implantable cardioverter defibrillator generator change  09/26/13    BiV ICD generator change by Dr Rayann Heman - MDT [place] CRT-D  . Cholecystectomy    . Eye surgery    . Left heart catheterization with coronary angiogram N/A 02/15/2012    Procedure: LEFT HEART CATHETERIZATION WITH CORONARY ANGIOGRAM;  Surgeon: Peter M Martinique, MD;  Location: [facility] CATH LAB;  Service: Cardiovascular;  Laterality: N/A;  . Right heart catheterization N/A 04/27/2012    Procedure: RIGHT HEART CATH;  Surgeon: Larey Dresser, MD;  Location: [facility] CATH LAB;  Service: Cardiovascular;  Laterality: N/A;  . Implantable cardioverter defibrillator generator change N/A 09/26/2013    Procedure: IMPLANTABLE CARDIOVERTER DEFIBRILLATOR GENERATOR CHANGE;  Surgeon: Coralyn Mark, MD;  Location: [facility] CATH LAB;  Service: Cardiovascular;  Laterality: N/A;    Family History  Problem Relation Age of Onset  . Coronary artery disease  Neg Hx     Social History Ms. Dino reports that she quit smoking about 12 years ago. Her smoking use included Cigarettes. She started smoking about 58 years ago. She has a 24 pack-year smoking history. She has never used smokeless tobacco. Ms. Jerge reports that she does not drink alcohol.  Review of Systems Complete review of systems are found to be negative unless outlined in H&P above.  Physical Examination Blood pressure 125/56, pulse 69, temperature 97.9 F (36.6 C), temperature source Oral, resp. rate 15, height 5\' 6"  (1.676 m), weight 162 lb 12.8 oz (73.846 kg), SpO2 94 %.  Intake/Output Summary (Last 24 hours) at 06/05/15 0943 Last data filed at 06/05/15 0935  Gross per 24 hour  Intake    240 ml  Output      0 ml  Net    240 ml    Telemetry: Paced rhythm.   GEN:No acute distress with multiple complaints.  HEENT: Conjunctiva and lids normal, oropharynx clear with moist mucosa. Neck: Supple, no elevated JVP or carotid bruits, no thyromegaly. Lungs: Clear to auscultation, nonlabored breathing at rest. Cardiac: Regular rate and [id number] systolic significant systolic murmur, no pericardial rub. Abdomen: Soft, nontender, no hepatomegaly, bowel sounds present, no guarding or rebound. Extremities: No pitting edema, distal pulses 2+. Skin: Warm and dry. Musculoskeletal: No kyphosis. Neuropsychiatric: Alert and oriented x3, affect grossly appropriate.  Prior Cardiac Testing/Procedures 1. Echocardiogram 01/02/2013 Left ventricle: The cavity size was moderately dilated. Wall thickness was increased in a pattern of severe LVH. Systolic function was mildly to moderately reduced. The estimated ejection fraction was in the range of 40% to 45%. There is hypokinesis of the entireinferior myocardium. Doppler parameters are consistent with abnormal left ventricular relaxation (grade 1 diastolic dysfunction). - Mitral valve: Mild regurgitation. - Left atrium:  The atrium was moderately dilated. - Pulmonary arteries: PA peak pressure: 92mm Hg (S).  2. Right Heart Cath 04/27/2012 Procedural Findings: Hemodynamics (mmHg) RA 19 RV 47/18 PA 51/24, mean 36 PCWP mean 26  Oxygen saturations (on 4L [place]): PA 51% AO 96%  Cardiac Output (Fick) 4.26  Cardiac Index (Fick) 2.24  Cardiac Output (Thermo) 5.19 Cardiac Index (Thermo) 2.73  PVR 2.3 WU (using Fick CO)  Final Conclusions: Elevated right and left heart filling pressures.   Cardiac Cath 02/15/2012 Coronary angiography: Coronary dominance: right Left mainstem: There is moderate calcified disease in the distal left main up to 40%. Left anterior descending (LAD): There is a 50% ostial stenosis in the LAD. The stent in the proximal LAD is widely patent with diffuse 20-30% in-stent disease. The second diagonal branch has a 90% stenosis at its origin. Left circumflex (LCx): The left circumflex coronary is occluded in the mid vessel and reconstituted by faint left to left collaterals. Right coronary artery (RCA): The right coronary is occluded proximally at the site of prior stent. Left ventriculography: Left ventricular angiography was not performed.  Final Conclusions:  1. Three-vessel obstructive coronary disease. The culprit lesion is the right coronary  which is occluded at the site of a prior stent. The stent in the proximal LAD is patent. The left circumflex occlusion is chronic. The diagonal disease is also chronic. The patient is now 18 hours out from her presentation and her chest pain is currently resolved.  Recommendations: I would recommend continued medical therapy at this point. We will assess her LV function by echocardiogram. I think there is little advantage at this point to try and reperfuse the right coronary given her very late presentation and improvement in her chest pain symptoms   Lab Results  Basic Metabolic Panel:  Recent Labs Lab 06/04/15 1756 06/05/15 0521   NA 139 139  K 4.1 4.0  CL 93* 99*  CO2 33* 32  GLUCOSE 211* 201*  BUN 31* 31*  CREATININE 1.56* 1.50*  CALCIUM 9.4 8.9    Liver Function Tests:  Recent Labs Lab 06/04/15 1756  AST 26  ALT 19  ALKPHOS 104  BILITOT 2.1*  PROT 7.9  ALBUMIN 4.2    CBC:  Recent Labs Lab 06/04/15 1756 06/05/15 0521  WBC 7.2 5.5  NEUTROABS 5.1  --   HGB 13.4 12.2  HCT 40.4 36.9  MCV 86.1 86.4  PLT 139* 130*    Cardiac Enzymes:  Recent Labs Lab 06/04/15 1756 06/05/15 0521  TROPONINI 0.38* 0.28*    Radiology: Dg Chest 2 View  06/04/2015  CLINICAL DATA:  Dizziness and dehydration with intermittent chest pain EXAM: CHEST - 2 VIEW COMPARISON:  09/30/2014 FINDINGS: Cardiac shadow is stable. A defibrillator is again noted as well as epicardial leads. The lungs are clear bilaterally. No acute bony abnormality is seen. IMPRESSION: No active disease. Electronically Signed   By: Inez Catalina M.D.   On: 06/04/2015 18:24   Ct Head Wo Contrast  06/04/2015  CLINICAL DATA:  Dizziness for 3 weeks EXAM: CT HEAD WITHOUT CONTRAST TECHNIQUE: Contiguous axial images were obtained from the base of the skull through the vertex without intravenous contrast. COMPARISON:  None. FINDINGS: Bony calvarium is intact. Mild atrophic changes are seen. The vague area decreased attenuation is noted in the centrum semi ovale on the left best seen on image number 18 series 2. This likely represents chronic ischemic change. IMPRESSION: Chronic atrophic and ischemic change.  No acute abnormality noted. Electronically Signed   By: Inez Catalina M.D.   On: 06/04/2015 18:30     ECG: Paced rhythm with RBBB.    Impression and Recommendations  1 Positive Troponin: Only minimally elevated without associated chest pain or EKG changes. Creatinine mildly elevated as well. Cannot rule out demand ischemia.   2. CAD: Stents to LAD and RCA. Will check echo for LV fx to evaluate for worsening function or improvement. This will help  to manage her treatment plan. Would continue coreg at current dose. Not on ACE due to renal insufficiency. Consider OP stress. Will await echo.   3. Generalized Weakness and Fatigue:  Uncertain etiology. She has been dealing with this since Thanksgiving. Doubt BB causing. Multiple somatic complaints.   4. CRT-D in situ: Last interrogated in June of 2016. Consider having it checked while here or set up remote check on discharge. She wants to go home.   5. Diabetes: She is having a lot of neuralgia pain and numbness.   Signed: Phill Myron. Lawrence NP [place]  06/05/2015, 9:43 AM Co-Sign MD  The patient was seen and examined, and I agree with the assessment and plan as documented above, with modifications as noted  below. Pt admitted with generalized weakness over the past month. Denies chest pain and shortness of breath. Diuretics were temporarily held in outpatient setting to see if this would improve her symptoms but it did not. Optivol thoracic impedance also suggestive of dehydration in late November.  Last coronary angiogram and echocardiogram reviewed above.  Device interrogation on 12/09/14 showed normal device function, 99% biventricular pacing, and no mode switches or ventricular arrhythmias.  Hgb normal with history of GI bleed in April 2016 at [facility]. An EGD showed 2 prepyloric ulcers with a clean base and no active bleeding.  She used to be on ASA and Plavix earlier this year.  Recommendations: Given the stability of her Hgb over time, I will reintroduce ASA 81 mg daily. While her symptoms are atypical, ischemic heart disease can present in a similar fashion, particularly in the elderly. Troponins only minimally elevated and not indicative of an acute coronary syndrome.  I will review the echocardiogram to see if there has been an interval change in LVEF. Given biventricular pacing occurring 99%, she may have had an improvement. If so, I will reduce her diuretic  regimen.  Finally, I will arrange for an outpatient Lexiscan Cardiolite stress test next week, prior to her appt with me in [place] on 06/15/15. This will help determine if her left main and LAD disease have progressed to the point of becoming hemodynamically significant.  Kate Sable, MD, [facility]  06/05/2015 10:06 AM  ADDENDUM: I reviewed the echocardiogram and LV systolic function has normalized when compared to 12/2012, LVEF now 55%. At the time of discharge, I would recommend discontinuing spironolactone and reducing torsemide to 20 mg daily.

## 2015-06-05 NOTE — Progress Notes (Signed)
**Note De-Identified [name] Obfuscation** Abnormal EKG results reported to RN

## 2015-06-05 NOTE — Care Management Note (Signed)
Case Management Note  Patient Details  Name: [name] MRN: [id number] Date of Birth: 1934-04-10  Subjective/Objective:                  Pt admitted from home with dizziness. Pt lives alone and will return home at discharge. Pt is independent with ADL's. Pt has a cane and walker for prn use. Pt stated she still drives as needed.  Action/Plan: No CM needs noted.  Expected Discharge Date:                  Expected Discharge Plan:  Home/Self Care  In-House Referral:  NA  Discharge planning Services  CM Consult  Post Acute Care Choice:  NA Choice offered to:  NA  DME Arranged:    DME Agency:     HH Arranged:    HH Agency:     Status of Service:  Completed, signed off  Medicare Important Message Given:    Date Medicare IM Given:    Medicare IM give by:    Date Additional Medicare IM Given:    Additional Medicare Important Message give by:     If discussed at [place] of Stay Meetings, dates discussed:    Additional Comments:  Joylene Draft, RN 06/05/2015, 12:58 PM

## 2015-06-05 NOTE — Progress Notes (Signed)
[facility] PROGRESS NOTE  REIN REINING [id number] DOB: 07-27-1933 DOA: 06/04/2015 PCP: Deloria Lair, MD    Code Status: Full code Family Communication: Discussed with patient; family not available Disposition Plan: Discharged home in the next 24 hours   Consultants:  Cardiology  Procedures:  Echocardiogram, pending  Antibiotics:  None  HPI/Subjective: Patient still complains of generalized weakness, but she denies chest pain. She has mild shortness of breath with ambulation. She complains of chronic pain in her knees and requests scheduled dosing of hydromorphone.  Objective: Filed Vitals:   06/04/15 2253 06/05/15 0620  BP: 158/76 125/56  Pulse: 83 69  Temp: 97.8 F (36.6 C) 97.9 F (36.6 C)  Resp: 18 15   oxygen saturation 94% on room air.  Intake/Output Summary (Last 24 hours) at 06/05/15 1451 Last data filed at 06/05/15 0935  Gross per 24 hour  Intake    240 ml  Output      0 ml  Net    240 ml   Filed Weights   06/04/15 1730 06/04/15 2253  Weight: 74.39 kg (164 lb) 73.846 kg (162 lb 12.8 oz)    Exam:   General:  Mildly obese 81 year old Caucasian woman sitting in bed in no acute distress.  Cardiovascular: S1, S2, with a 1 to 2/6 systolic murmur.  Respiratory: Clear to auscultation bilaterally.  Abdomen: Mildly obese, positive bowel sounds, soft, nontender, nondistended.  Musculoskeletal/extremities: Hypertrophic changes in her knees, but no acute red joints. No pedal edema.  Neurologic: The patient is alert and oriented 3. Cranial nerves II through XII are grossly intact. Handgrip is 5 over 5 throughout. The patient was able to stand and take a few steps without assistance. Sensation is grossly intact   Data Reviewed: Basic Metabolic Panel:  Recent Labs Lab 06/04/15 1756 06/05/15 0521  NA 139 139  K 4.1 4.0  CL 93* 99*  CO2 33* 32  GLUCOSE 211* 201*  BUN 31* 31*  CREATININE 1.56* 1.50*  CALCIUM 9.4 8.9   Liver  Function Tests:  Recent Labs Lab 06/04/15 1756  AST 26  ALT 19  ALKPHOS 104  BILITOT 2.1*  PROT 7.9  ALBUMIN 4.2   No results for input(s): LIPASE, AMYLASE in the last 168 hours. No results for input(s): AMMONIA in the last 168 hours. CBC:  Recent Labs Lab 06/04/15 1756 06/05/15 0521  WBC 7.2 5.5  NEUTROABS 5.1  --   HGB 13.4 12.2  HCT 40.4 36.9  MCV 86.1 86.4  PLT 139* 130*   Cardiac Enzymes:  Recent Labs Lab 06/04/15 1756 06/05/15 0521 06/05/15 1025  TROPONINI 0.38* 0.28* 0.30*   BNP (last 3 results) No results for input(s): BNP in the last 8760 hours.  ProBNP (last 3 results) No results for input(s): PROBNP in the last 8760 hours.  CBG:  Recent Labs Lab 06/04/15 2249 06/05/15 0731  GLUCAP 296* 162*    No results found for this or any previous visit (from the past 240 hour(s)).   Studies: Dg Chest 2 View  06/04/2015  CLINICAL DATA:  Dizziness and dehydration with intermittent chest pain EXAM: CHEST - 2 VIEW COMPARISON:  09/30/2014 FINDINGS: Cardiac shadow is stable. A defibrillator is again noted as well as epicardial leads. The lungs are clear bilaterally. No acute bony abnormality is seen. IMPRESSION: No active disease. Electronically Signed   By: Inez Catalina M.D.   On: 06/04/2015 18:24   Ct Head Wo Contrast  06/04/2015  CLINICAL DATA:  Dizziness for 3  weeks EXAM: CT HEAD WITHOUT CONTRAST TECHNIQUE: Contiguous axial images were obtained from the base of the skull through the vertex without intravenous contrast. COMPARISON:  None. FINDINGS: Bony calvarium is intact. Mild atrophic changes are seen. The vague area decreased attenuation is noted in the centrum semi ovale on the left best seen on image number 18 series 2. This likely represents chronic ischemic change. IMPRESSION: Chronic atrophic and ischemic change.  No acute abnormality noted. Electronically Signed   By: Inez Catalina M.D.   On: 06/04/2015 18:30    Scheduled Meds: . aspirin  81 mg Oral  Daily  . carvedilol  3.125 mg Oral BID WC  . citalopram  20 mg Oral Daily  . famotidine  10 mg Oral Daily  . heparin  5,000 Units Subcutaneous 3 times per day  . HYDROmorphone  4 mg Oral TID  . insulin aspart  0-20 Units Subcutaneous TID WC  . insulin aspart  0-5 Units Subcutaneous QHS  . insulin glargine  30 Units Subcutaneous QHS  . sodium chloride  3 mL Intravenous Q12H   Continuous Infusions: . sodium chloride 70 mL/hr at 06/05/15 1015   Assessment and plan:  Principal Problem:   Weakness generalized Active Problems:   Coronary atherosclerosis of native coronary artery   SYSTOLIC HEART FAILURE, CHRONIC   Biventricular implantable cardioverter-defibrillator in situ   Elevated troponin I level   Chronic pain syndrome   Thrombocytopenia (HCC)   Diabetes mellitus with neuropathy (HCC)   Chronic kidney disease    1. Generalized weakness. The etiology of her symptoms is unknown. However, given her troponin I, there was some concern about ischemia causing her generalized weakness. She reports recently increasing hydromorphone from 4 mg 3-4 times a day to 8 mg 3-4 times a day. This could also be a possibility of her generalized weakness. She appears to be mildly depleted and this could be contributing. -Her TSH was assessed and was within normal limits. -Her diuretics are currently on hold. Gentle IV fluids were started this morning. -We'll order vitamin B12 level for further evaluation.  Elevated troponin I in a patient with coronary artery disease-status post stenting to the LAD and RCA, ischemic cardiomyopathy-with an EF of 40-45% per echo 12/2012, and biventricular defibrillator in situ. The patient is treated chronically with carvedilol. Her troponin I was 0.21 on admission and increased to a high of 0.38. It is now 0.30. She was restarted on carvedilol. -2-D echocardiogram was ordered for further evaluation; currently pending. -Cardiology evaluated the patient and thought that  she did not have acute coronary syndrome, but possibly demand ischemia. Dr. Bronson Ing recommended reintroducing aspirin at 81 mg daily. He will arrange an outpatient Lexiscan Cardiolite stress test next week. -The patient is chest pain-free.  Chronic systolic heart failure. The patient's recent EF was 40-45% per echo 12/2012. She is treated chronically with torsemide and spironolactone. Both medications were withheld on admission. Her chest x-ray revealed no acute pulmonary edema. -She was continued on carvedilol. -She was started on gentle IV fluids as it appeared that she was somewhat volume depleted. -We'll restart a lower dose of torsemide and restart spironolactone tomorrow morning.  Chronic kidney disease, likely stage 3-4 Per chart review, the patient's baseline creatinine ranges from 1.6-1.95. It was 1.56 on admission, at baseline. -As above, gentle IV fluids were started and her diuretics were temporarily withheld. -We'll continue monitoring.  Diabetes mellitus with nephropathy. The patient is treated chronically with Lantus. Lantus was restarted. Sliding-scale NovoLog was added.  Her CBGs were initially elevated, but have improved. Will order hemoglobin A1c.  Chronic pain syndrome.  The patient has a history of chronic pain from DJD. She reported that her primary care physician started oral hydromorphone which release her pain. Recently, the dose was increased 8 mg. I informed the patient that I was not comfortable giving her that amount during hospitalization and it may have been part of the reason that she had generalized weakness. -Per request, hydromorphone was changed to scheduled 4 mg 3 times a day with 2 mg every 6 hours as needed for breakthrough pain.  Thrombocytopenia. Patient's platelet count was 139 on admission. Etiology is unknown. Her TSH was within normal limits. Will order a vitamin B12 level to rule out deficiency.   Time spent: 35  minutes.    [facility] Pager [phone]. If 7PM-7AM, please contact night-coverage at [email or web address], password [facility] 06/05/2015, 2:51 PM

## 2015-06-05 NOTE — Telephone Encounter (Signed)
Lexiscan stress test order is done.  Patient is still currently admitted.   Will forward to scheduling when patient is discharged.

## 2015-06-05 NOTE — Progress Notes (Signed)
*  PRELIMINARY RESULTS* Echocardiogram 2D Echocardiogram has been performed.  Leavy Cella 06/05/2015, 4:14 PM

## 2015-06-05 NOTE — Care Management Obs Status (Signed)
[facility] NOTIFICATION   Patient Details  Name: [name] MRN: [id number] Date of Birth: 1934/03/04   Medicare Observation Status Notification Given:  Yes    Joylene Draft, RN 06/05/2015, 12:58 PM

## 2015-06-05 NOTE — Telephone Encounter (Signed)
-----   Message from Herminio Commons, MD sent at 06/05/2015 10:28 AM EST ----- Regarding: Please arrange for outpt Lexiscan Hi, Just saw patient in hospital. She has an appt with me on 12/19 as per her.  Please order a Lexiscan for next week so that I can review prior to her appt.  Thanks.  Dr. Bronson Ing

## 2015-06-06 DIAGNOSIS — R531 Weakness: Secondary | ICD-10-CM | POA: Diagnosis not present

## 2015-06-06 DIAGNOSIS — R778 Other specified abnormalities of plasma proteins: Secondary | ICD-10-CM | POA: Insufficient documentation

## 2015-06-06 DIAGNOSIS — R7989 Other specified abnormal findings of blood chemistry: Secondary | ICD-10-CM | POA: Diagnosis not present

## 2015-06-06 DIAGNOSIS — Z95810 Presence of automatic (implantable) cardiac defibrillator: Secondary | ICD-10-CM | POA: Diagnosis not present

## 2015-06-06 DIAGNOSIS — I5022 Chronic systolic (congestive) heart failure: Secondary | ICD-10-CM | POA: Diagnosis not present

## 2015-06-06 LAB — LIPID PANEL
CHOL/HDL RATIO: 7.3 ratio
CHOLESTEROL: 146 mg/dL (ref 0–200)
HDL: 20 mg/dL — AB (ref >40)
LDL Cholesterol: 68 mg/dL (ref 0–99)
TRIGLYCERIDES: 289 mg/dL — AB (ref <150)
VLDL: 58 mg/dL — AB (ref 0–40)

## 2015-06-06 LAB — CBC
HCT: 35.2 % — ABNORMAL LOW (ref 36.0–46.0)
Hemoglobin: 11.4 g/dL — ABNORMAL LOW (ref 12.0–15.0)
MCH: 28.1 pg (ref 26.0–34.0)
MCHC: 32.4 g/dL (ref 30.0–36.0)
MCV: 86.9 fL (ref 78.0–100.0)
PLATELETS: 122 10*3/uL — AB (ref 150–400)
RBC: 4.05 MIL/uL (ref 3.87–5.11)
RDW: 14.9 % (ref 11.5–15.5)
WBC: 4.3 10*3/uL (ref 4.0–10.5)

## 2015-06-06 LAB — BASIC METABOLIC PANEL
Anion gap: 6 (ref 5–15)
BUN: 25 mg/dL — AB (ref 6–20)
CALCIUM: 9.2 mg/dL (ref 8.9–10.3)
CO2: 33 mmol/L — AB (ref 22–32)
CREATININE: 1.48 mg/dL — AB (ref 0.44–1.00)
Chloride: 99 mmol/L — ABNORMAL LOW (ref 101–111)
GFR calc Af Amer: 38 mL/min — ABNORMAL LOW (ref >60)
GFR calc non Af Amer: 32 mL/min — ABNORMAL LOW (ref >60)
GLUCOSE: 202 mg/dL — AB (ref 65–99)
Potassium: 4.4 mmol/L (ref 3.5–5.1)
Sodium: 138 mmol/L (ref 135–145)

## 2015-06-06 LAB — HEMOGLOBIN A1C
Hgb A1c MFr Bld: 8.5 % — ABNORMAL HIGH (ref 4.8–5.6)
MEAN PLASMA GLUCOSE: 197 mg/dL

## 2015-06-06 LAB — GLUCOSE, CAPILLARY: GLUCOSE-CAPILLARY: 148 mg/dL — AB (ref 65–99)

## 2015-06-06 MED ORDER — HYDROMORPHONE HCL 4 MG PO TABS: 4.0000 mg | ORAL_TABLET | Freq: Four times a day (QID) | ORAL | Status: DC | PRN

## 2015-06-06 MED ORDER — ASPIRIN 81 MG PO CHEW: 81.0000 mg | CHEWABLE_TABLET | Freq: Every day | ORAL | Status: AC

## 2015-06-06 MED ORDER — TORSEMIDE 20 MG PO TABS: 20.0000 mg | ORAL_TABLET | Freq: Every day | ORAL | Status: DC

## 2015-06-06 NOTE — Progress Notes (Signed)
Reviewed discharge instructions and medications with client and caregiver. Understanding voiced. Client discharged to home in stable condition. Client escorted to vehicle via wheelchair.

## 2015-06-06 NOTE — Discharge Summary (Signed)
Physician Discharge Summary  [name] [id number] DOB: 07/10/1933 DOA: 06/04/2015  PCP: Deloria Lair, MD  Admit date: 06/04/2015 Discharge date: 06/06/2015  Time spent: Greater than 30 minutes  Recommendations for Outpatient Follow-up:  1. Cardiology will arrange for a Lexiscan Cardiolite next week. 2. Recommend follow-up of the patient's platelet count. 3. Recommend checking vitamin B12 level in 6 months as it was on the lower end of normal.    Discharge Diagnoses:  1. Elevated troponin I in a patient with CAD, with a history of stenting to the LAD and RCA. 2. History of ischemic cardiomyopathy with a prior EF of 40-45% per echo 12/2012. -Echo during this hospitalization revealed an EF of 50-55%. 3. Biventricular defibrillator in situ. 4. Generalized weakness, likely multifactorial. 5. Thrombocytopenia of unknown etiology. 6. Stage III-IV chronic kidney disease. 7. Diabetes mellitus with nephropathy. 8. Chronic pain syndrome with DJD, on chronic hydromorphone.  Discharge Condition: Improved.  Diet recommendation: Heart healthy, carbohydrate modified.  Filed Weights   06/04/15 1730 06/04/15 2253  Weight: 74.39 kg (164 lb) 73.846 kg (162 lb 12.8 oz)    History of present illness:  The patient is a 81 year old woman with a history of chronic systolic heart failure, ischemic cardiomyopathy, defibrillator in situ, diabetes mellitus, chronic pain syndrome-on chronic opiate, and chronic kidney disease, who presented to the emergency department on 06/04/2015 with a chief complaint of generalized weakness. In the ED, she was found to have an elevated troponin I. She was admitted for further evaluation and management.  Hospital Course:   1. Generalized weakness. The etiology of her symptoms was unknown, but likely multifactorial. Given that her troponin I was elevated, there was some concern about ischemia causing her generalized weakness. She also reported recently  increasing hydromorphone from 4 mg 3-4 times daily to 8 mg 3-4 times daily. The increase could have also led to generalized weakness. She also appeared to be mildly depleted which could've contributed. -Her TSH was assessed and was within normal limits. Her vitamin B12 level was within normal limits, but on the lower end of the normal range. -Her diuretics were initially held and she was started on gentle IV fluids. -Her generalized weakness resolved. She had no focal weakness on exam.  Elevated troponin I in a patient with coronary artery disease-history of stenting to the LAD and RCA, ischemic cardiomyopathy and biventricular defibrillator in situ. The patient is treated chronically with carvedilol. Her troponin I was 0.21 on admission and increased to a high of 0.38. It trended down to 0.30. She was restarted on carvedilol. -Cardiology evaluated the patient and thought that she did not have acute coronary syndrome, but possibly demand ischemia. Dr. Bronson Ing recommended reintroducing aspirin at 81 mg daily.  -2-D echocardiogram was ordered and revealed an improvement in her EF to 50-55%. -The tentative plan is for an outpatient Lexiscan Cardiolite stress test next week. -The patient was chest pain-free.  Chronic systolic heart failure. The patient's recent EF was 40-45% per echo 12/2012. She was treated chronically with torsemide and spironolactone. Both medications were withheld on admission. Her chest x-ray revealed no acute pulmonary edema. -She was continued on carvedilol. -She was started on gentle IV fluids as it appeared that she was somewhat volume depleted. -Her EF improved to 50-55% per echo during this hospitalization. Given the improvement, Dr. Bronson Ing recommended that spironolactone be discontinued and torsemide be decreased to 20 mg down from 60 mg daily. The patient was instructed on this.  Chronic kidney disease, likely stage  3-4 Per chart review, the patient's baseline  creatinine ranges from 1.6-1.95. It was 1.56 on admission, at baseline. -As above, gentle IV fluids were started and her diuretics were temporarily withheld. -Her creatinine improved to 1.48 at the time of discharge.  Diabetes mellitus with nephropathy. The patient is treated chronically with Lantus. Lantus was restarted, but at a lower dose. Sliding-scale NovoLog was added. Her CBGs were initially elevated, but  improved. Her hemoglobin A1c was 8.5, indicating suboptimal control. The patient reported taking 50-60 units of Lantus twice daily. I did not change the dosing at discharge, because of the possibility of noncompliance. Would defer long-term treatment to her PCP.  Chronic pain syndrome.  The patient has a history of chronic pain from DJD. She reported that her primary care physician started oral hydromorphone which relieves her pain. Recently, the dose was increased 8 mg. I informed the patient that I was not comfortable giving her the higher dose during the hospitalization and it may have been part of the reason that she had generalized weakness. -She was restarted on hydromorphone 4 mg 3-4 times daily. -She was instructed to discuss this further with her PCP.  Thrombocytopenia. Patient's platelet count was 139 on admission. It decreased to 122 from the dilutional effects of IV fluids. Etiology was unknown. Her TSH and vitamin B12 level were within normal limits. Would recommend rechecking her vitamin B12 level in 6 months as it was on the lower end of the normal range.      Procedures: Study Conclusions - Left ventricle: The cavity size was moderately dilated. Wall thickness was increased in a pattern of moderate LVH. Systolic function was normal. The estimated ejection fraction was in the range of 50% to 55%. Doppler parameters are consistent with abnormal left ventricular relaxation (grade 1 diastolic dysfunction). Doppler parameters are consistent with  high ventricular filling pressure. - Regional wall motion abnormality: Moderate hypokinesis of the mid inferoseptal, basal-mid inferior, and mid inferolateral myocardium. - Aortic valve: Moderately calcified annulus. - Mitral valve: Calcified annulus. There was mild regurgitation. - Left atrium: The atrium was mildly dilated. - Right ventricle: Pacer wire or catheter noted in right ventricle. - Right atrium: The atrium was mildly dilated. Pacer wire or catheter noted in right atrium. - Tricuspid valve: There was mild regurgitation. - Pulmonary arteries: Systolic pressure was mildly increased. PA peak pressure: 32 mm Hg (S). Impressions: - When compared to the study dated 01/02/13, left ventricular systolic function has normalized, LVEF 55%.  Consultations:  Cardiology  Discharge Exam: Filed Vitals:   06/05/15 2328 06/06/15 0512  BP: 118/55 128/55  Pulse: 65 67  Temp: 98.4 F (36.9 C) 97.8 F (36.6 C)  Resp: 20 20   oxygen saturation 98% on room air.   General: Mildly obese 81 year old Caucasian woman sitting in bed in no acute distress.  Cardiovascular: S1, S2, with a  2/6 systolic murmur.  Respiratory: Coarse breath sounds, but no audible wheezes or crackles; decreased breath sounds in the bases.  Abdomen: Mildly obese, positive bowel sounds, soft, nontender, nondistended.  Musculoskeletal/extremities: Hypertrophic changes in her knees, but no acute red joints. No pedal edema.  Neurologic: The patient is alert and oriented 3. Cranial nerves II through XII are grossly intact. Patient is seen ambulating with her cane in the room without difficulty.  Discharge Instructions   Discharge Instructions    Diet - low sodium heart healthy    Complete by:  As directed      Diet Carb Modified  Complete by:  As directed      Discharge instructions    Complete by:  As directed   DECREASE DILAUDID TO 4 MG TABLET EVERY 6 HOURS AS NEEDED.     Increase activity  slowly    Complete by:  As directed           Current Discharge Medication List    START taking these medications   Details  aspirin 81 MG chewable tablet Chew 1 tablet (81 mg total) by mouth daily.      CONTINUE these medications which have CHANGED   Details  HYDROmorphone (DILAUDID) 4 MG tablet Take 1 tablet (4 mg total) by mouth every 6 (six) hours as needed for severe pain.    torsemide (DEMADEX) 20 MG tablet Take 1 tablet (20 mg total) by mouth daily.      CONTINUE these medications which have NOT CHANGED   Details  ALPRAZolam (XANAX) 0.5 MG tablet Take 0.5 mg by mouth at bedtime as needed for anxiety.    Ascorbic Acid (VITAMIN C) 100 MG tablet Take 100 mg by mouth daily.    carvedilol (COREG) 3.125 MG tablet TAKE ONE TABLET BY MOUTH TWICE DAILY WITH A MEAL Qty: 180 tablet, Refills: 0    citalopram (CELEXA) 20 MG tablet Take 20 mg by mouth daily.    insulin glargine (LANTUS) 100 UNIT/ML injection Inject 50-60 Units into the skin 2 (two) times daily. 60 units in the morning and 50 units at night    OXYGEN-HELIUM IN 2 L as needed.    ranitidine (ZANTAC) 150 MG tablet Take 150 mg by mouth 2 (two) times daily.     nitroGLYCERIN (NITROSTAT) 0.4 MG SL tablet Place 1 tablet (0.4 mg total) under the tongue every 5 (five) minutes as needed for chest pain. Qty: 25 tablet, Refills: 3      STOP taking these medications     spironolactone (ALDACTONE) 25 MG tablet        Allergies  Allergen Reactions  . Augmentin [Amoxicillin-Pot Clavulanate]     Abdominal pain  . Metformin     Upset stomach  . Sulfonamide Derivatives     swelling   Follow-up Information    Follow up with Herminio Commons, MD.   Specialty:  Cardiology   Why:  Cardiologist. His office will call you for follow up.   Contact information:   [place] [id number] [phone]       Schedule an appointment as soon as possible for a visit with TAPPER,DAVID B, MD.   Specialty:   Family Medicine   Why:  Follow up in 2 weeks   Contact information:   [place] [address] [phone]        The results of significant diagnostics from this hospitalization (including imaging, microbiology, ancillary and laboratory) are listed below for reference.    Significant Diagnostic Studies: Dg Chest 2 View  06/04/2015  CLINICAL DATA:  Dizziness and dehydration with intermittent chest pain EXAM: CHEST - 2 VIEW COMPARISON:  09/30/2014 FINDINGS: Cardiac shadow is stable. A defibrillator is again noted as well as epicardial leads. The lungs are clear bilaterally. No acute bony abnormality is seen. IMPRESSION: No active disease. Electronically Signed   By: Inez Catalina M.D.   On: 06/04/2015 18:24   Ct Head Wo Contrast  06/04/2015  CLINICAL DATA:  Dizziness for 3 weeks EXAM: CT HEAD WITHOUT CONTRAST TECHNIQUE: Contiguous axial images were obtained  from the base of the skull through the vertex without intravenous contrast. COMPARISON:  None. FINDINGS: Bony calvarium is intact. Mild atrophic changes are seen. The vague area decreased attenuation is noted in the centrum semi ovale on the left best seen on image number 18 series 2. This likely represents chronic ischemic change. IMPRESSION: Chronic atrophic and ischemic change.  No acute abnormality noted. Electronically Signed   By: Inez Catalina M.D.   On: 06/04/2015 18:30    Microbiology: No results found for this or any previous visit (from the past 240 hour(s)).   Labs: Basic Metabolic Panel:  Recent Labs Lab 06/04/15 1756 06/05/15 0521 06/06/15 0503  NA 139 139 138  K 4.1 4.0 4.4  CL 93* 99* 99*  CO2 33* 32 33*  GLUCOSE 211* 201* 202*  BUN 31* 31* 25*  CREATININE 1.56* 1.50* 1.48*  CALCIUM 9.4 8.9 9.2   Liver Function Tests:  Recent Labs Lab 06/04/15 1756  AST 26  ALT 19  ALKPHOS 104  BILITOT 2.1*  PROT 7.9  ALBUMIN 4.2   No results for input(s): LIPASE, AMYLASE in the last 168 hours. No  results for input(s): AMMONIA in the last 168 hours. CBC:  Recent Labs Lab 06/04/15 1756 06/05/15 0521 06/06/15 0503  WBC 7.2 5.5 4.3  NEUTROABS 5.1  --   --   HGB 13.4 12.2 11.4*  HCT 40.4 36.9 35.2*  MCV 86.1 86.4 86.9  PLT 139* 130* 122*   Cardiac Enzymes:  Recent Labs Lab 06/04/15 1756 06/05/15 0521 06/05/15 1025  TROPONINI 0.38* 0.28* 0.30*   BNP: BNP (last 3 results) No results for input(s): BNP in the last 8760 hours.  ProBNP (last 3 results) No results for input(s): PROBNP in the last 8760 hours.  CBG:  Recent Labs Lab 06/05/15 0731 06/05/15 1115 06/05/15 1611 06/05/15 2027 06/06/15 0736  GLUCAP 162* 169* 107* 211* 148*       Signed:  Vear Staton  [facility] 06/06/2015, 9:53 AM

## 2015-06-08 NOTE — Telephone Encounter (Signed)
Patient discharged on 06/06/2015.  Will forward to [facility] for scheduling.

## 2015-06-10 ENCOUNTER — Encounter (HOSPITAL_COMMUNITY)
Admission: RE | Admit: 2015-06-10 | Discharge: 2015-06-10 | Disposition: A | Discharge status: Home / Self Care | Payer: Medicare Other | Source: Ambulatory Visit | Attending: Cardiovascular Disease | Admitting: Cardiovascular Disease

## 2015-06-10 ENCOUNTER — Telehealth: Payer: Self-pay | Admitting: Physician Assistant

## 2015-06-10 ENCOUNTER — Inpatient Hospital Stay (HOSPITAL_COMMUNITY): Admission: RE | Admit: 2015-06-10 | Payer: Medicare Other | Source: Ambulatory Visit

## 2015-06-10 ENCOUNTER — Encounter (HOSPITAL_COMMUNITY): Payer: Self-pay

## 2015-06-10 DIAGNOSIS — R531 Weakness: Secondary | ICD-10-CM | POA: Insufficient documentation

## 2015-06-10 DIAGNOSIS — I2510 Atherosclerotic heart disease of native coronary artery without angina pectoris: Secondary | ICD-10-CM | POA: Diagnosis present

## 2015-06-10 HISTORY — DX: Malignant (primary) neoplasm, unspecified: C80.1

## 2015-06-10 LAB — NM MYOCAR MULTI W/SPECT W/WALL MOTION / EF
CHL CUP RESTING HR STRESS: 61 {beats}/min
CSEPPHR: 78 {beats}/min
LV dias vol: 215 mL
LVSYSVOL: 152 mL
RATE: 0.58
SDS: 10
SRS: 24
SSS: 34
TID: 1.17

## 2015-06-10 MED ORDER — TECHNETIUM TC 99M SESTAMIBI - CARDIOLITE
10.0000 | Freq: Once | INTRAVENOUS | Status: AC | PRN
  Administered 2015-06-10: 9.8 via INTRAVENOUS

## 2015-06-10 MED ORDER — TECHNETIUM TC 99M SESTAMIBI GENERIC - CARDIOLITE
30.0000 | Freq: Once | INTRAVENOUS | Status: AC | PRN
  Administered 2015-06-10: 31.4 via INTRAVENOUS

## 2015-06-10 MED ORDER — REGADENOSON 0.4 MG/5ML IV SOLN
INTRAVENOUS | Status: AC
  Administered 2015-06-10: 0.4 mg via INTRAVENOUS
  Filled 2015-06-10: qty 5

## 2015-06-10 MED ORDER — SODIUM CHLORIDE 0.9 % IJ SOLN
INTRAMUSCULAR | Status: AC
  Administered 2015-06-10: 10 mL via INTRAVENOUS
  Filled 2015-06-10: qty 3

## 2015-06-10 NOTE — Telephone Encounter (Signed)
Paged by answering service with patient's blood sugar of 63. She took regular insulin dose last night. Currently mild sweating. Denies Confusion, headache, weakness, chest pain, palpitations, SOB, irritability. Advice to grab a bit or two of sugary stuff like candy. Her brother providing transportation to go to stress test.  [place], [place]

## 2015-06-11 ENCOUNTER — Telehealth: Payer: Self-pay | Admitting: *Deleted

## 2015-06-11 NOTE — Telephone Encounter (Signed)
Notes Recorded by Laurine Blazer, LPN on [id number] at 8:38 AM Patient notified. Copy to pmd. Follow up already scheduled for Monday, 06/15/15 with Dr. Bronson Ing.

## 2015-06-11 NOTE — Telephone Encounter (Signed)
-----   Message from Herminio Commons, MD sent at 06/10/2015  4:45 PM EST ----- No new significant blockages. Will treat medically.

## 2015-06-15 ENCOUNTER — Ambulatory Visit (INDEPENDENT_AMBULATORY_CARE_PROVIDER_SITE_OTHER): Payer: Medicare Other | Admitting: Cardiovascular Disease

## 2015-06-15 ENCOUNTER — Encounter: Payer: Self-pay | Admitting: Cardiovascular Disease

## 2015-06-15 ENCOUNTER — Ambulatory Visit (INDEPENDENT_AMBULATORY_CARE_PROVIDER_SITE_OTHER): Payer: Medicare Other

## 2015-06-15 VITALS — BP 130/74 | HR 73 | Ht 66.0 in | Wt 169.0 lb

## 2015-06-15 DIAGNOSIS — I2510 Atherosclerotic heart disease of native coronary artery without angina pectoris: Secondary | ICD-10-CM | POA: Diagnosis not present

## 2015-06-15 DIAGNOSIS — R0602 Shortness of breath: Secondary | ICD-10-CM | POA: Diagnosis not present

## 2015-06-15 DIAGNOSIS — I255 Ischemic cardiomyopathy: Secondary | ICD-10-CM | POA: Diagnosis not present

## 2015-06-15 DIAGNOSIS — I5022 Chronic systolic (congestive) heart failure: Secondary | ICD-10-CM | POA: Diagnosis not present

## 2015-06-15 DIAGNOSIS — Z95810 Presence of automatic (implantable) cardiac defibrillator: Secondary | ICD-10-CM | POA: Diagnosis not present

## 2015-06-15 DIAGNOSIS — E785 Hyperlipidemia, unspecified: Secondary | ICD-10-CM

## 2015-06-15 DIAGNOSIS — R635 Abnormal weight gain: Secondary | ICD-10-CM

## 2015-06-15 DIAGNOSIS — I5023 Acute on chronic systolic (congestive) heart failure: Secondary | ICD-10-CM

## 2015-06-15 NOTE — Patient Instructions (Signed)
   Increase Torsemide to 20mg  twice a day  X 4 days only, then resume previous dosing.  Continue all other medications.   Lab for BMET - due this Friday, 06/19/2015.   Office will contact with results via phone or letter.   Follow up in  1 month

## 2015-06-15 NOTE — Progress Notes (Signed)
EPIC Encounter for ICM Monitoring  Patient Name: [name] is a 81 y.o. female Date: 06/15/2015 Primary Care Physican: Deloria Lair, MD Primary Cardiologist: Bronson Ing Electrophysiologist: Allred Dry Weight: 165 lbs       In the past month, have you:  1. Gained more than 2 pounds in a day or more than 5 pounds in a week? Yes, patient was 158 lbs on 05/20/2015.  She reported most of the weight gain has been since her discharge on 06/03/2015.   2. Had changes in your medications (with verification of current medications)? Yes, Torsemide decreased from 60 mg daily to 20 mg daily at time of hospital discharge  3. Had more shortness of breath than is usual for you? Yes  4. Limited your activity because of shortness of breath? Yes  5. Not been able to sleep because of shortness of breath? Yes  6. Had increased swelling in your feet or ankles? no  7. Had symptoms of dehydration (dizziness, dry mouth, increased thirst, decreased urine output) no  8. Had changes in sodium restriction? no  9. Been compliant with medication? Yes   ICM trend: 06/15/2015   Follow-up plan: ICM clinic phone appointment on 06/25/2015 for repeat transmission.   Optivol thoracic impedance is below baseline starting 06/03/2015 suggesting fluid retention.  She has weight gain from 158 lbs last month to 165 today and significant SOB.  She stated she was in the hospital 06/04/2015 to 06/06/2015 and Torsemide was reduced from 60 mg to 20 mg.  She has an appointment with Dr Bronson Ing today and advised her to inform him of her symptoms.  She reported that she thinks the pharmacy made a mistake filling her pain med and noticed that the pills were not all alike. She took the bottle back to the pharmacy and there were 14 prednisone pills in her pain medication bottle. The pharmacy stated it was not their mistake and she must have combined the 2 meds.  She had unknowingly taking the prednisone pills instead of the  pain pills up until the last week and has no idea how much she took.  She reported her blood sugars have been dropping very low due to the insulin she is taking and she has been adjusting the insulin dosage.   Patient upset and crying due to all the medical issues she is having and does not feel she is getting the help she needs to manage it.  She has appointment with PCP this week and advised her to explain to him how she feels and the issues she is having.  She stated she has been taking extra 1/2 dose of Torsemide yesterday due to her SOB and if Dr Bronson Ing does not increase her diuretic she will take extra as she needs it.  Advised that taking extra medication can have negative effect on her kidney function and could make it worse.  Advised she should not take extra Tosemide without physicians approval.    Advised will send to Dr Bronson Ing and Dr Rayann Heman for review.  Explained Dr Bronson Ing will be able to see the note today at her appointment.    Copy of note sent to patient's primary care physician, primary cardiologist, and device following physician.  Rosalene Billings, RN, CCM 06/15/2015 10:05 AM

## 2015-06-15 NOTE — Progress Notes (Signed)
Patient ID: [name], female   DOB: June 01, 1934, 81 y.o.   MRN: [id number]      SUBJECTIVE: The patient presents for post hospitalization follow-up. I saw her earlier this month at [facility]. She was admitted with generalized weakness but no chest pain or shortness of breath. Optivol thoracic impedance was suggestive of dehydration in late November.  I restarted aspirin 81 mg daily given her stability of hemoglobin over time. She had a mildly elevated troponin indicative of demand ischemia. I obtained an echocardiogram which demonstrated that left ventricular systolic function had normalized when compared to July 2014 , LVEF 55%. I discontinued spironolactone and reduced torsemide to 20 mg daily given LVEF normalization.   I also obtained a nuclear stress test which demonstrated extensive prior myocardial scar in the anterior, lateral, and inferior walls, with very minimal peri-infarct ischemia in the anterior wall.  She has undergone prior stenting with her most recent coronary angiogram performed in August 2013 which showed left main 40%, LAD 50% ostial with patent stent in prox LAD, D2 90%, LCX occluded in mid vessel with left to left collaterals, RCA occluded proximally at prior stent site. She has been medically managed.  Optivol impedance has suggested fluid retention and weight gain from 158 pounds last month to 165 pounds today (169 lbs on our office scale in [place]). She is also short of breath. She has apparently also mistakenly taken several prednisone pills instead of taking pain medications which may have been contributing to this fluid retention.  She has felt more short of breath and says she has had to use oxygen. She denies leg swelling. She feels that there has been fluid accumulation in her abdomen. She tells me she is not certain if she took prednisone or not. Denies chest pain.  Review of Systems: As per "subjective", otherwise negative.  Allergies  Allergen  Reactions  . Augmentin [Amoxicillin-Pot Clavulanate]     Abdominal pain  . Metformin     Upset stomach  . Sulfonamide Derivatives     swelling    Current Outpatient Prescriptions  Medication Sig Dispense Refill  . ALPRAZolam (XANAX) 0.5 MG tablet Take 0.5 mg by mouth at bedtime as needed for anxiety.    . Ascorbic Acid (VITAMIN C) 100 MG tablet Take 100 mg by mouth daily.    Marland Kitchen aspirin 81 MG chewable tablet Chew 1 tablet (81 mg total) by mouth daily.    . carvedilol (COREG) 3.125 MG tablet TAKE ONE TABLET BY MOUTH TWICE DAILY WITH A MEAL 180 tablet 0  . citalopram (CELEXA) 20 MG tablet Take 20 mg by mouth daily.    Marland Kitchen HYDROmorphone (DILAUDID) 4 MG tablet Take 1 tablet (4 mg total) by mouth every 6 (six) hours as needed for severe pain. (Patient taking differently: Take 6 mg by mouth every 6 (six) hours as needed for severe pain. )    . insulin glargine (LANTUS) 100 UNIT/ML injection Inject 50-60 Units into the skin 2 (two) times daily. 60 units in the morning and 50 units at night    . nitroGLYCERIN (NITROSTAT) 0.4 MG SL tablet Place 1 tablet (0.4 mg total) under the tongue every 5 (five) minutes as needed for chest pain. 25 tablet 3  . OXYGEN-HELIUM IN 2 L as needed.    . ranitidine (ZANTAC) 150 MG tablet Take 150 mg by mouth 2 (two) times daily.     Marland Kitchen torsemide (DEMADEX) 20 MG tablet Take 1 tablet (20 mg total) by mouth  daily.     No current facility-administered medications for this visit.    Past Medical History  Diagnosis Date  . Type 2 diabetes mellitus ([place])   . Anxiety disorder   . GERD (gastroesophageal reflux disease)   . Hyperlipidemia     H/o GI intolerance to Lipitor  . Ischemic cardiomyopathy     s/p Medtronic BiV ICD 2006  . Systolic and diastolic CHF, chronic (HCC)     echo 03/2012 EF 123456, mod diastolic dysfunction, mod MR, mod LAE, mod-severe TR, PASP 63mmHg  . Impingement syndrome of left shoulder   . Aneurysm ([place])     Apical; previously on Coumadin  .  Chronic renal insufficiency     Creatinine 1.17 January 2010  . Peripheral polyneuropathy (HCC)     Refractory to several medications  . Coronary artery disease     Cath 02/15/12 revealed RCA as culprit lesion w/ occlusion at site of prior stent, patent LAD stent, chronic LCx and Diagonal dz; Medical therapy   . Depression   . Cancer [facility])     Skin    Past Surgical History  Procedure Laterality Date  . Abdominal hysterectomy    . Cardiac defibrillator placement  05/28/2009    Medtronic ICD placed by Dr. Caryl Comes secondary to ICM/CHF, [id number] Fidelis lead fracture with ICD storm 2010 requiring new RV lead placement  . Implantable cardioverter defibrillator generator change  09/26/13    BiV ICD generator change by Dr Rayann Heman - MDT [place] CRT-D  . Cholecystectomy    . Eye surgery    . Left heart catheterization with coronary angiogram N/A 02/15/2012    Procedure: LEFT HEART CATHETERIZATION WITH CORONARY ANGIOGRAM;  Surgeon: Peter M Martinique, MD;  Location: [facility] CATH LAB;  Service: Cardiovascular;  Laterality: N/A;  . Right heart catheterization N/A 04/27/2012    Procedure: RIGHT HEART CATH;  Surgeon: Larey Dresser, MD;  Location: [facility] CATH LAB;  Service: Cardiovascular;  Laterality: N/A;  . Implantable cardioverter defibrillator generator change N/A 09/26/2013    Procedure: IMPLANTABLE CARDIOVERTER DEFIBRILLATOR GENERATOR CHANGE;  Surgeon: Coralyn Mark, MD;  Location: [facility] CATH LAB;  Service: Cardiovascular;  Laterality: N/A;    Social History   Social History  . Marital Status: Widowed    Spouse Name: N/A  . Number of Children: N/A  . Years of Education: N/A   Occupational History  . Retired    Social History Main Topics  . Smoking status: Former Smoker -- 0.80 packs/day for 30 years    Types: Cigarettes    Start date: 04/05/1957    Quit date: 06/27/2002  . Smokeless tobacco: Never Used  . Alcohol Use: No  . Drug Use: No  . Sexual Activity: Not Currently   Other Topics Concern  . Not on  file   Social History Narrative     Filed Vitals:   06/15/15 1103  BP: 130/74  Pulse: 73  Height: 5\' 6"  (1.676 m)  Weight: 169 lb (76.658 kg)  SpO2: 97%    PHYSICAL EXAM General: NAD HEENT: Normal. Neck: No JVD, no thyromegaly. Lungs: Clear to auscultation bilaterally with normal respiratory effort. CV: Nondisplaced PMI.  Regular rate and rhythm, normal S1/S2, no S3/S4, no murmur. No pretibial or periankle edema.    Abdomen: Soft, nontender, possibly mild distention.  Neurologic: Alert and oriented x 3.  Psych: Normal affect. Skin: Normal. Musculoskeletal: No gross deformities. Extremities: No clubbing or cyanosis.   ECG: Most recent ECG reviewed.  ASSESSMENT AND PLAN: 1. CAD with ischemic cardiomyopathy: Stable ischemic heart disease with LVEF normalization. Continue ASA and Coreg.   2. Chronic systolic and diastolic heart failure: Has had weight gain and Optivol impedance suggestive of fluid retention. No leg swelling or pulmonary edema. Will increase torsemide to 20 mg bid x 4 days and check BMET this Friday. Afterwards, will reduce back to 20 mg daily.  3. Hyperlipidemia: Has not wanted to try alternative statin therapy.  4. CRT-D: Functioning normally. Followed by Dr. Rayann Heman.  Dispo: f/u 1 month with APP. Follow up with me in 6 months.  Kate Sable, M.D., F.A.C.C.

## 2015-06-24 ENCOUNTER — Ambulatory Visit (INDEPENDENT_AMBULATORY_CARE_PROVIDER_SITE_OTHER): Payer: Medicare Other

## 2015-06-24 DIAGNOSIS — I5022 Chronic systolic (congestive) heart failure: Secondary | ICD-10-CM

## 2015-06-24 DIAGNOSIS — Z95810 Presence of automatic (implantable) cardiac defibrillator: Secondary | ICD-10-CM

## 2015-06-24 NOTE — Progress Notes (Signed)
Herminio Commons, MD  Rosalene Billings, RN     Cc: Laurine Blazer, LPN            Thanks [name]. Can we try metolazone 2.5 mg daily x 3 days.  Give her 20 tablets just in case she needs them in the future. Thanks

## 2015-06-24 NOTE — Progress Notes (Signed)
EPIC Encounter for ICM Monitoring  Patient Name: [name] is a 81 y.o. female Date: 06/24/2015 Primary Care Physican: Deloria Lair, MD Primary Cardiologist: Bronson Ing Electrophysiologist: Allred Dry Weight: 166 lbs  Bi-V Pacing 97.8%       In the past month, have you:  1. Gained more than 2 pounds in a day or more than 5 pounds in a week? Yes, patient's weight gain in the last 2 weeks has not improved.  Patient was 158 lbs on 05/20/2015.    2. Had changes in your medications (with verification of current medications)? no  3. Had more shortness of breath than is usual for you? no  4. Limited your activity because of shortness of breath? no  5. Not been able to sleep because of shortness of breath? no  6. Had increased swelling in your feet or ankles? no  7. Had symptoms of dehydration (dizziness, dry mouth, increased thirst, decreased urine output) no  8. Had changes in sodium restriction? no  9. Been compliant with medication? Yes   ICM trend: 06/24/2015   Follow-up plan: ICM clinic phone appointment 07/13/2015 and has in office appointment with Dr Rayann Heman 07/03/2015.  Optivol thoracic impedance continues below baseline since last ICM transmission on 06/15/2015.  She reported she continues to have SOB and using oxygen, weight gain and belly swelling.  She reported she followed Dr Court Joy directions given on 06/15/2015 and took additional dosages of Torsemide.  She reported her blood sugars are not well controlled.   Lab results on 06/23/2015 - BUN 21 and Creatinine 1.14, Potassium 4.7.  History of Creatinine results range from 1.48 on 06/06/2015 to 1.96 in 2014.   Will refer to physician for recommendations if needed as Creatinine has been high in the past month.    Advised would send for Dr Bronson Ing and Dr Rayann Heman for review and will call her back with any recommendations.    Copy of note sent to patient's primary care physician, primary cardiologist, and  device following physician.  Rosalene Billings, RN, CCM 06/24/2015 11:17 AM

## 2015-06-25 MED ORDER — METOLAZONE 2.5 MG PO TABS: 2.5000 mg | ORAL_TABLET | ORAL | Status: DC

## 2015-06-25 NOTE — Progress Notes (Signed)
Telephone call to patient.  Advised patient that Dr Bronson Ing has ordered to take 1 tablet of Metolazone 2.5 mg x 3 days only.  Explained Metolazone should be taken 30 minutes prior to taking Torsemide.   Reviewed some side effects but not all to report such as weakness, nausea, vomiting, muscle pain or cramps, dizziness, lightheadedness.  Advised if she experiences side effects to stop the Metolazone and call the office or 911 if needed.  She verbalized understanding.  Reminded her she has an office appointment with Dr Rayann Heman on 07/03/2015 @ 11:45 AM and he will check the device at that time.

## 2015-07-03 ENCOUNTER — Ambulatory Visit (INDEPENDENT_AMBULATORY_CARE_PROVIDER_SITE_OTHER): Payer: Medicare Other | Admitting: Internal Medicine

## 2015-07-03 ENCOUNTER — Encounter: Payer: Self-pay | Admitting: Internal Medicine

## 2015-07-03 VITALS — BP 114/60 | HR 74 | Ht 68.0 in | Wt 164.0 lb

## 2015-07-03 DIAGNOSIS — R0602 Shortness of breath: Secondary | ICD-10-CM | POA: Diagnosis not present

## 2015-07-03 DIAGNOSIS — I5022 Chronic systolic (congestive) heart failure: Secondary | ICD-10-CM

## 2015-07-03 DIAGNOSIS — Z95810 Presence of automatic (implantable) cardiac defibrillator: Secondary | ICD-10-CM | POA: Diagnosis not present

## 2015-07-03 DIAGNOSIS — I255 Ischemic cardiomyopathy: Secondary | ICD-10-CM

## 2015-07-03 LAB — CUP PACEART INCLINIC DEVICE CHECK
Battery Remaining Longevity: 58 mo
Battery Voltage: 2.96 V
Brady Statistic RA Percent Paced: 6.28 %
Brady Statistic RV Percent Paced: 96.89 %
Date Time Interrogation Session: 20170106132439
HighPow Impedance: 55 Ohm
HighPow Impedance: 68 Ohm
Implantable Lead Implant Date: 20060424
Implantable Lead Implant Date: 20101202
Implantable Lead Model: 5076
Implantable Lead Model: 7120
Lead Channel Impedance Value: 494 Ohm
Lead Channel Pacing Threshold Amplitude: 1.125 V
Lead Channel Pacing Threshold Pulse Width: 0.4 ms
Lead Channel Sensing Intrinsic Amplitude: 1.25 mV
Lead Channel Sensing Intrinsic Amplitude: 1.625 mV
Lead Channel Setting Pacing Amplitude: 2.5 V
MDC IDC LEAD IMPLANT DT: 20060424
MDC IDC LEAD LOCATION: 753858
MDC IDC LEAD LOCATION: 753859
MDC IDC LEAD LOCATION: 753860
MDC IDC LEAD MODEL: 4194
MDC IDC MSMT LEADCHNL LV IMPEDANCE VALUE: 4047 Ohm
MDC IDC MSMT LEADCHNL LV IMPEDANCE VALUE: 4047 Ohm
MDC IDC MSMT LEADCHNL LV IMPEDANCE VALUE: 437 Ohm
MDC IDC MSMT LEADCHNL LV PACING THRESHOLD AMPLITUDE: 1.875 V
MDC IDC MSMT LEADCHNL LV PACING THRESHOLD PULSEWIDTH: 1 ms
MDC IDC MSMT LEADCHNL RA IMPEDANCE VALUE: 494 Ohm
MDC IDC MSMT LEADCHNL RA PACING THRESHOLD AMPLITUDE: 1 V
MDC IDC MSMT LEADCHNL RA PACING THRESHOLD PULSEWIDTH: 0.4 ms
MDC IDC MSMT LEADCHNL RV IMPEDANCE VALUE: 323 Ohm
MDC IDC MSMT LEADCHNL RV SENSING INTR AMPL: 12.75 mV
MDC IDC MSMT LEADCHNL RV SENSING INTR AMPL: 13.75 mV
MDC IDC SET LEADCHNL LV PACING PULSEWIDTH: 1 ms
MDC IDC SET LEADCHNL RA PACING AMPLITUDE: 2 V
MDC IDC SET LEADCHNL RV PACING AMPLITUDE: 2.5 V
MDC IDC SET LEADCHNL RV PACING PULSEWIDTH: 0.4 ms
MDC IDC SET LEADCHNL RV SENSING SENSITIVITY: 0.3 mV
MDC IDC STAT BRADY AP VP PERCENT: 6.2 %
MDC IDC STAT BRADY AP VS PERCENT: 0.09 %
MDC IDC STAT BRADY AS VP PERCENT: 92.22 %
MDC IDC STAT BRADY AS VS PERCENT: 1.5 %

## 2015-07-03 NOTE — Progress Notes (Signed)
PCP:Deloria Lair, MD Primary Cardiologist:  Dr Harl Bowie  The patient presents today for routine electrophysiology followup.  Since her last visit, the patient has done reasonably well.  Her SOB is stable.  She has frequent difficulty with volume overload and requires additional torsemide or zaroxolyn frequently.  This past week, she had significant volume overload.  After 2 zaroxolyn, she feels now back to baseline.  Her primary concern is with neuropathic foot pain.  She says that lyrica and neurontin have not been helpful previously.   Presently, she is taking dilaudid.   Today, she denies symptoms of palpitations, chest pain,  lower extremity edema, dizziness, presyncope, syncope, or neurologic sequela.  The patient feels that she is tolerating medications without difficulties and is otherwise without complaint today.    Past Medical History  Diagnosis Date  . Type 2 diabetes mellitus ([place])   . Anxiety disorder   . GERD (gastroesophageal reflux disease)   . Hyperlipidemia     H/o GI intolerance to Lipitor  . Ischemic cardiomyopathy     s/p Medtronic BiV ICD 2006  . Systolic and diastolic CHF, chronic (HCC)     echo 03/2012 EF 123456, mod diastolic dysfunction, mod MR, mod LAE, mod-severe TR, PASP 79mmHg  . Impingement syndrome of left shoulder   . Aneurysm ([place])     Apical; previously on Coumadin  . Chronic renal insufficiency     Creatinine 1.17 January 2010  . Peripheral polyneuropathy (HCC)     Refractory to several medications  . Coronary artery disease     Cath 02/15/12 revealed RCA as culprit lesion w/ occlusion at site of prior stent, patent LAD stent, chronic LCx and Diagonal dz; Medical therapy   . Depression   . Cancer [facility])     Skin   Past Surgical History  Procedure Laterality Date  . Abdominal hysterectomy    . Cardiac defibrillator placement  05/28/2009    Medtronic ICD placed by Dr. Caryl Comes secondary to ICM/CHF, [phone] Fidelis lead fracture with ICD storm 2010 requiring new  RV lead placement  . Implantable cardioverter defibrillator generator change  09/26/13    BiV ICD generator change by Dr Rayann Heman - MDT [place] CRT-D  . Cholecystectomy    . Eye surgery    . Left heart catheterization with coronary angiogram N/A 02/15/2012    Procedure: LEFT HEART CATHETERIZATION WITH CORONARY ANGIOGRAM;  Surgeon: Peter M Martinique, MD;  Location: [facility] CATH LAB;  Service: Cardiovascular;  Laterality: N/A;  . Right heart catheterization N/A 04/27/2012    Procedure: RIGHT HEART CATH;  Surgeon: Larey Dresser, MD;  Location: [facility] CATH LAB;  Service: Cardiovascular;  Laterality: N/A;  . Implantable cardioverter defibrillator generator change N/A 09/26/2013    Procedure: IMPLANTABLE CARDIOVERTER DEFIBRILLATOR GENERATOR CHANGE;  Surgeon: Coralyn Mark, MD;  Location: [facility] CATH LAB;  Service: Cardiovascular;  Laterality: N/A;    Current Outpatient Prescriptions  Medication Sig Dispense Refill  . ALPRAZolam (XANAX) 0.5 MG tablet Take 0.5 mg by mouth at bedtime as needed for anxiety.    . Ascorbic Acid (VITAMIN C) 100 MG tablet Take 100 mg by mouth daily.    Marland Kitchen aspirin 81 MG chewable tablet Chew 1 tablet (81 mg total) by mouth daily.    . carvedilol (COREG) 3.125 MG tablet TAKE ONE TABLET BY MOUTH TWICE DAILY WITH A MEAL 180 tablet 0  . citalopram (CELEXA) 20 MG tablet Take 20 mg by mouth daily.    Marland Kitchen HYDROmorphone (DILAUDID) 4 MG tablet  Take 6 mg by mouth every 4 (four) hours as needed for severe pain.    Marland Kitchen insulin glargine (LANTUS) 100 UNIT/ML injection Inject 35 Units into the skin 2 (two) times daily. 60 units in the morning and 50 units at night    . nitroGLYCERIN (NITROSTAT) 0.4 MG SL tablet Place 1 tablet (0.4 mg total) under the tongue every 5 (five) minutes as needed for chest pain. 25 tablet 3  . OXYGEN-HELIUM IN 2 L as needed.    . ranitidine (ZANTAC) 150 MG tablet Take 150 mg by mouth 2 (two) times daily.     Marland Kitchen torsemide (DEMADEX) 20 MG tablet Take 1 tablet (20 mg total) by mouth daily.      No current facility-administered medications for this visit.    Allergies  Allergen Reactions  . Augmentin [Amoxicillin-Pot Clavulanate]     Abdominal pain  . Metformin     Upset stomach  . Metolazone     "made everything hurt" 07/03/15  . Sulfonamide Derivatives     swelling    Social History   Social History  . Marital Status: Widowed    Spouse Name: N/A  . Number of Children: N/A  . Years of Education: N/A   Occupational History  . Retired    Social History Main Topics  . Smoking status: Former Smoker -- 0.80 packs/day for 30 years    Types: Cigarettes    Start date: 04/05/1957    Quit date: 06/27/2002  . Smokeless tobacco: Never Used  . Alcohol Use: No  . Drug Use: No  . Sexual Activity: Not Currently   Other Topics Concern  . Not on file   Social History Narrative    Family History  Problem Relation Age of Onset  . Coronary artery disease Neg Hx    Physical Exam: Filed Vitals:   07/03/15 1150  BP: 114/60  Pulse: 74  Height: 5\' 8"  (1.727 m)  Weight: 164 lb (74.39 kg)  SpO2: 91%    GEN- The patient is well appearing, alert and oriented x 3 today.   Head- normocephalic, atraumatic Eyes-  Sclera clear, conjunctiva pink Ears- hearing intact Oropharynx- clear Neck- supple,  Lungs- Clear to ausculation bilaterally, normal work of breathing Chest- ICD pocket is well healed Heart- Regular rate and rhythm, decreased heart sounds GI- soft, NT, ND, + BS Extremities- no clubbing, cyanosis, or edema Neuro- strength and sensation are intact  ICD interrogation- reviewed in detail today,  See PACEART report ekg today reveals sinus with BiV pacing  Assessment and Plan:   1. Chronic systolic dysfunction/ ischemic CM She is followed by Sharman Cheek in our ICM device clinic. Normal BiV function See Pace Art report No changes today Check bmet, bnp today Appears euvolemic today 2 gram sodium diet  2. Cad Stable No change required  today  carelink Return to see me in 1 year  Thompson Grayer MD, [facility] 07/03/2015 12:20 PM

## 2015-07-03 NOTE — Patient Instructions (Addendum)
Your physician recommends that you continue on your current medications as directed. Please refer to the Current Medication list given to you today. Your physician recommends that you have lab work today to check your BMET and BNP. Device check on 10/05/15. Your physician recommends that you schedule a follow-up appointment in: 1 year with Dr. Rayann Heman. You can schedule this appointment today or you can wait for your letter to come in the mail in about 10 months reminding you to call and schedule this appointment. If you do not receive this letter, please contact our office for your appointment.

## 2015-07-08 ENCOUNTER — Telehealth: Payer: Self-pay | Admitting: *Deleted

## 2015-07-08 NOTE — Telephone Encounter (Signed)
-----   Message from Thompson Grayer, MD sent at 07/07/2015  5:05 PM EST ----- Alma Friendly,  Please inform patient of result

## 2015-07-08 NOTE — Telephone Encounter (Signed)
Patient informed. 

## 2015-07-13 ENCOUNTER — Telehealth: Payer: Self-pay | Admitting: Cardiology

## 2015-07-13 ENCOUNTER — Ambulatory Visit (INDEPENDENT_AMBULATORY_CARE_PROVIDER_SITE_OTHER): Payer: Medicare Other

## 2015-07-13 DIAGNOSIS — Z95810 Presence of automatic (implantable) cardiac defibrillator: Secondary | ICD-10-CM

## 2015-07-13 DIAGNOSIS — I5022 Chronic systolic (congestive) heart failure: Secondary | ICD-10-CM

## 2015-07-13 NOTE — Telephone Encounter (Signed)
Spoke with pt and reminded pt of remote transmission that is due today. Pt verbalized understanding.   

## 2015-07-13 NOTE — Progress Notes (Signed)
EPIC Encounter for ICM Monitoring  Patient Name: [name] is a 82 y.o. female Date: 07/13/2015 Primary Care Physican: Deloria Lair, MD Primary Cardiologist: Bronson Ing Electrophysiologist: Allred Dry Weight: 166 lbs  Bi-V Pacing 97.7%       In the past month, have you:  1. Gained more than 2 pounds in a day or more than 5 pounds in a week? no  2. Had changes in your medications (with verification of current medications)? no  3. Had more shortness of breath than is usual for you? no  4. Limited your activity because of shortness of breath? no  5. Not been able to sleep because of shortness of breath? no  6. Had increased swelling in your feet or ankles? no  7. Had symptoms of dehydration (dizziness, dry mouth, increased thirst, decreased urine output) no  8. Had changes in sodium restriction? no  9. Been compliant with medication? Yes   ICM trend: 3 month view 07/13/2015         ICM trend: 1 year view 07/13/2015  Follow-up plan: ICM clinic phone appointment on 07/20/2015 and appointment with Dr Bronson Ing on 07/21/2015.   OPTIVOL:  Daily thoracic impedance trended along baseline after taking Metolazone on 06/26/2015 and 06/27/2015.  Thoracic impedance below reference line starting ~07/04/2015 suggesting fluid retention.   SYMPTOMS:  Swelling increase in belly. Has had respiratory infection and called EMS last week due to unable to breathe even with oxygen.  She stated she is better but still not well.  She is taking Prednisone.   DIET: She reported she follows low salt diet.   Recommended she increase Torsemide to 40 mg daily x 2 days and then resume prescribed dosage of 20 mg one tablet daily.   LABS:  07/03/2015  Creatinine 1.26, BUN 30, Potassium 5.2  Copy of note sent to patient's primary care physician, primary cardiologist, and device following physician.  Rosalene Billings, RN, CCM 07/13/2015 2:41 PM

## 2015-07-20 ENCOUNTER — Telehealth: Payer: Self-pay

## 2015-07-20 ENCOUNTER — Ambulatory Visit: Payer: Medicare Other | Admitting: Adult Health

## 2015-07-20 ENCOUNTER — Ambulatory Visit (INDEPENDENT_AMBULATORY_CARE_PROVIDER_SITE_OTHER): Payer: Medicare Other | Admitting: *Deleted

## 2015-07-20 DIAGNOSIS — I5022 Chronic systolic (congestive) heart failure: Secondary | ICD-10-CM | POA: Diagnosis not present

## 2015-07-20 DIAGNOSIS — Z95810 Presence of automatic (implantable) cardiac defibrillator: Secondary | ICD-10-CM

## 2015-07-20 NOTE — Telephone Encounter (Signed)
Call to patient.  Requested she send ICM transmission for today.  She stated her friend over the weekend disconnected the monitor and she is having difficulty getting the transmission to go through.  Provided Medtronic tech support phone number and advised for her to call for assistance.

## 2015-07-20 NOTE — Progress Notes (Signed)
EPIC Encounter for ICM Monitoring  Patient Name: [name] is a 82 y.o. female Date: 07/20/2015 Primary Care Physican: Deloria Lair, MD Primary Cardiologist: Bronson Ing Electrophysiologist: Allred Dry Weight: 158 lbs  Bi-V Pacing 96.9%       In the past month, have you:  1. Gained more than 2 pounds in a day or more than 5 pounds in a week? no  2. Had changes in your medications (with verification of current medications)? no  3. Had more shortness of breath than is usual for you? no  4. Limited your activity because of shortness of breath? no  5. Not been able to sleep because of shortness of breath? no  6. Had increased swelling in your feet or ankles? no  7. Had symptoms of dehydration (dizziness, dry mouth, increased thirst, decreased urine output) no  8. Had changes in sodium restriction? no  9. Been compliant with medication? Yes   ICM trend: 3 month view for 07/20/2015   ICM trend: 1 year view for 07/20/2015   Follow-up plan: ICM clinic phone appointment on 08/20/2015.  Optivol thoracic impedance back to baseline since last ICM remote transmission on 07/13/2015 and taking extra Torsemide x 2 days on 07/13/2015.  She stated she continues to have a virus and feels weak.  She denied any HF symptoms today but is wearing Oxygen all the time now.  No changes today and encouraged her to call should she experience any HF symptoms.    Copy of note sent to patient's primary care physician, primary cardiologist, and device following physician.  Rosalene Billings, RN, CCM 07/20/2015 12:00 PM

## 2015-07-21 ENCOUNTER — Encounter: Payer: Self-pay | Admitting: Cardiovascular Disease

## 2015-07-21 ENCOUNTER — Ambulatory Visit (INDEPENDENT_AMBULATORY_CARE_PROVIDER_SITE_OTHER): Payer: Medicare Other | Admitting: Cardiovascular Disease

## 2015-07-21 VITALS — BP 121/71 | HR 61 | Ht 66.0 in | Wt 161.0 lb

## 2015-07-21 DIAGNOSIS — R531 Weakness: Secondary | ICD-10-CM

## 2015-07-21 DIAGNOSIS — I2510 Atherosclerotic heart disease of native coronary artery without angina pectoris: Secondary | ICD-10-CM

## 2015-07-21 DIAGNOSIS — Z95810 Presence of automatic (implantable) cardiac defibrillator: Secondary | ICD-10-CM | POA: Diagnosis not present

## 2015-07-21 DIAGNOSIS — R062 Wheezing: Secondary | ICD-10-CM

## 2015-07-21 DIAGNOSIS — R0602 Shortness of breath: Secondary | ICD-10-CM

## 2015-07-21 DIAGNOSIS — I5022 Chronic systolic (congestive) heart failure: Secondary | ICD-10-CM

## 2015-07-21 DIAGNOSIS — E785 Hyperlipidemia, unspecified: Secondary | ICD-10-CM

## 2015-07-21 MED ORDER — PREDNISONE 5 MG PO TABS: 5.0000 mg | ORAL_TABLET | Freq: Every day | ORAL | Status: DC

## 2015-07-21 NOTE — Patient Instructions (Addendum)
   Chest x-ray - order given today.  Use your inhaler that you already have at home - 2 puffs every 6 hours as needed SOB or wheezing   Begin Prednisone 5mg  daily x 5 days only.  - new sent to [facility] today.  Continue all other medications.   Follow up in  2 weeks

## 2015-07-21 NOTE — Progress Notes (Signed)
Patient ID: [name], female   DOB: 14-Feb-1934, 82 y.o.   MRN: [id number]      SUBJECTIVE: The patient presents for follow-up of chronic systolic heart failure and coronary artery disease. Most recent Optivol thoracic impedance was back to baseline on 07/20/15 after taking an extra dose of torsemide for 2 days.  She has undergone prior stenting with her most recent coronary angiogram performed in August 2013 which showed left main 40%, LAD 50% ostial with patent stent in prox LAD, D2 90%, LCX occluded in mid vessel with left to left collaterals, RCA occluded proximally at prior stent site. She has been medically managed.  Echocardiogram on 06/05/15  demonstrated that left ventricular systolic function had normalized when compared to July 2014 , LVEF 55%.  Nuclear stress test on 06/10/15 demonstrated extensive prior myocardial scar in the anterior, lateral, and inferior walls, with very minimal peri-infarct ischemia in the anterior wall.  Wt 161 lbs (164 on 1/6, 169 lbs on 06/15/15)).  She said she has had a virus for 2 weeks and has been off oxygen nearly 24 hours 7 days per week. She said in the past 2 days she has been able to come off of it for 2 hours at a time. She denies chest pain and leg swelling.  Also denies fevers and chills. She has an inhaler at home which she has not used.    Review of Systems: As per "subjective", otherwise negative.  Allergies  Allergen Reactions  . Augmentin [Amoxicillin-Pot Clavulanate]     Abdominal pain  . Metformin     Upset stomach  . Metolazone     "made everything hurt" 07/03/15  . Sulfonamide Derivatives     swelling    Current Outpatient Prescriptions  Medication Sig Dispense Refill  . ALPRAZolam (XANAX) 0.5 MG tablet Take 0.5 mg by mouth at bedtime as needed for anxiety.    . Ascorbic Acid (VITAMIN C) 100 MG tablet Take 100 mg by mouth daily.    Marland Kitchen aspirin 81 MG chewable tablet Chew 1 tablet (81 mg total) by mouth daily.    .  carvedilol (COREG) 3.125 MG tablet TAKE ONE TABLET BY MOUTH TWICE DAILY WITH A MEAL 180 tablet 0  . citalopram (CELEXA) 20 MG tablet Take 20 mg by mouth daily.    Marland Kitchen HYDROmorphone (DILAUDID) 4 MG tablet Take 6 mg by mouth every 6 (six) hours as needed for severe pain.     Marland Kitchen insulin glargine (LANTUS) 100 UNIT/ML injection Inject 40 Units into the skin 2 (two) times daily.     . nitroGLYCERIN (NITROSTAT) 0.4 MG SL tablet Place 1 tablet (0.4 mg total) under the tongue every 5 (five) minutes as needed for chest pain. 25 tablet 3  . OXYGEN-HELIUM IN 2 L as needed.    . ranitidine (ZANTAC) 150 MG tablet Take 150 mg by mouth 2 (two) times daily.     Marland Kitchen torsemide (DEMADEX) 20 MG tablet Take 1 tablet (20 mg total) by mouth daily.     No current facility-administered medications for this visit.    Past Medical History  Diagnosis Date  . Type 2 diabetes mellitus ([place])   . Anxiety disorder   . GERD (gastroesophageal reflux disease)   . Hyperlipidemia     H/o GI intolerance to Lipitor  . Ischemic cardiomyopathy     s/p Medtronic BiV ICD 2006  . Systolic and diastolic CHF, chronic (HCC)     echo 03/2012 EF 123456, mod diastolic dysfunction,  mod MR, mod LAE, mod-severe TR, PASP 55mmHg  . Impingement syndrome of left shoulder   . Aneurysm ([place])     Apical; previously on Coumadin  . Chronic renal insufficiency     Creatinine 1.17 January 2010  . Peripheral polyneuropathy (HCC)     Refractory to several medications  . Coronary artery disease     Cath 02/15/12 revealed RCA as culprit lesion w/ occlusion at site of prior stent, patent LAD stent, chronic LCx and Diagonal dz; Medical therapy   . Depression   . Cancer [facility])     Skin    Past Surgical History  Procedure Laterality Date  . Abdominal hysterectomy    . Cardiac defibrillator placement  05/28/2009    Medtronic ICD placed by Dr. Caryl Comes secondary to ICM/CHF, [phone] Fidelis lead fracture with ICD storm 2010 requiring new RV lead placement  .  Implantable cardioverter defibrillator generator change  09/26/13    BiV ICD generator change by Dr Rayann Heman - [facility] [place] CRT-D  . Cholecystectomy    . Eye surgery    . Left heart catheterization with coronary angiogram N/A 02/15/2012    Procedure: LEFT HEART CATHETERIZATION WITH CORONARY ANGIOGRAM;  Surgeon: Peter M Martinique, MD;  Location: [facility] C[facility] LAB;  Service: Cardiovascular;  Laterality: N/A;  . Right heart catheterization N/A 04/27/2012    Procedure: RIGHT HEART CATH;  Surgeon: Larey Dresser, MD;  Location: [facility] CATH LAB;  Service: Cardiovascular;  Laterality: N/A;  . Implantable cardioverter defibrillator generator change N/A 09/26/2013    Procedure: IMPLANTABLE CARDIOVERTER DEFIBRILLATOR GENERATOR CHANGE;  Surgeon: Coralyn Mark, MD;  Location: [facility] CATH LAB;  Service: Cardiovascular;  Laterality: N/A;    Social History   Social History  . Marital Status: Widowed    Spouse Name: N/A  . Number of Children: N/A  . Years of Education: N/A   Occupational History  . Retired    Social History Main Topics  . Smoking status: Former Smoker -- 0.80 packs/day for 30 years    Types: Cigarettes    Start date: 04/05/1957    Quit date: 06/27/2002  . Smokeless tobacco: Never Used  . Alcohol Use: No  . Drug Use: No  . Sexual Activity: Not Currently   Other Topics Concern  . Not on file   Social History Narrative     Filed Vitals:   07/21/15 1115  BP: 121/71  Pulse: 61  Height: 5\' 6"  (1.676 m)  Weight: 161 lb (73.029 kg)  SpO2: 95%    PHYSICAL EXAM General: NAD HEENT: Normal. Neck: No JVD, no thyromegaly. Lungs: Prolonged expiratory phase with bilateral wheezes, no rales. CV: Nondisplaced PMI. Regular rate and rhythm, normal S1/S2, no S3/S4, no murmur. No pretibial or periankle edema.  Abdomen: Soft, nontender, no distention.  Neurologic: Alert and oriented x 3.  Psych: Normal affect. Skin: Normal. Musculoskeletal: No gross deformities. Extremities: No clubbing or  cyanosis.    ECG: Most recent ECG reviewed.      ASSESSMENT AND PLAN: 1. CAD with ischemic cardiomyopathy: Stable ischemic heart disease with LVEF normalization. Continue ASA and Coreg.   2. Chronic systolic and diastolic heart failure: Euvolemic on torsemide 20 mg daily.  3. Hyperlipidemia: Has not wanted to try alternative statin therapy.  4. CRT-D: Functioning normally. Followed by Dr. Rayann Heman.  5. SOB/wheezing: May have bronchitis. Will obtain chest xray. Encouraged to use albuterol inhaler 2 puffs every four hours. Will prescribe prednisone 5 mg daily x 5 days. Need to monitor  for fluid retention.  Dispo: f/u 2 weeks.   Kate Sable, M.D., F.A.C.C.

## 2015-07-22 ENCOUNTER — Encounter: Payer: Self-pay | Admitting: Cardiology

## 2015-07-22 ENCOUNTER — Encounter: Payer: Self-pay | Admitting: *Deleted

## 2015-07-22 ENCOUNTER — Telehealth: Payer: Self-pay | Admitting: Cardiovascular Disease

## 2015-07-22 NOTE — Telephone Encounter (Signed)
Mrs. Bielefeldt is calling for her chest xray -States that she is feeling terrible today.

## 2015-07-23 NOTE — Telephone Encounter (Signed)
Patient informed that chest x-ray report is on Dr. Court Joy desktop awaiting his review.  Review of test does not report any pneumonia.  Informed patient that we will need to have MD review for final results.  Stated she is actually feeling a little better today than yesterday.  Still taking her Prednisone.  Will notify as soon as we have final results.  Patient verbalized understanding.

## 2015-07-24 ENCOUNTER — Telehealth: Payer: Self-pay | Admitting: *Deleted

## 2015-07-24 NOTE — Telephone Encounter (Signed)
Pt voices understanding of extra doses of torsemide, routed to pcp

## 2015-07-24 NOTE — Telephone Encounter (Signed)
-----   Message from Herminio Commons, MD sent at 07/24/2015  3:56 AM EST ----- Very mild degree of lung fluid buildup, no obvious pneumonia. Have her take an extra 20 mg of torsemide x 2 days then resume maintenance dose.

## 2015-07-27 ENCOUNTER — Telehealth: Payer: Self-pay

## 2015-07-27 NOTE — Telephone Encounter (Signed)
Call to patient and she was not sure when the next remote transmission was due.  Explained it will be 08/20/2015 unless she is having fluid symptoms, then she can send one earlier than that date.  She stated she had chest x-ray and she still had some fluid around her lungs and had to take 2 extra Torsemide last week.  She has appointment with PCP on 07/29/2015.  No changes today.   Received voice mail from patient requesting a return call.

## 2015-07-31 ENCOUNTER — Telehealth: Payer: Self-pay | Admitting: Cardiovascular Disease

## 2015-07-31 NOTE — Telephone Encounter (Signed)
Havery Moros RN for [facility] called and left a message stating that the pt is going to start in the CHF program and she's needing her most recent EF from either an Echo or Stress Test & the date. Telephone # [phone] ext [phone] Fax # [phone]

## 2015-08-03 NOTE — Telephone Encounter (Signed)
Echo 06/05/15 EF 55%.  Requested information faxed to Havery Moros RN with [facility].

## 2015-08-10 ENCOUNTER — Ambulatory Visit (INDEPENDENT_AMBULATORY_CARE_PROVIDER_SITE_OTHER): Payer: Medicare Other | Admitting: Cardiovascular Disease

## 2015-08-10 ENCOUNTER — Encounter: Payer: Self-pay | Admitting: Cardiovascular Disease

## 2015-08-10 VITALS — BP 128/60 | HR 60 | Ht 66.0 in | Wt 164.0 lb

## 2015-08-10 DIAGNOSIS — R0602 Shortness of breath: Secondary | ICD-10-CM | POA: Diagnosis not present

## 2015-08-10 DIAGNOSIS — I2510 Atherosclerotic heart disease of native coronary artery without angina pectoris: Secondary | ICD-10-CM

## 2015-08-10 DIAGNOSIS — Z95810 Presence of automatic (implantable) cardiac defibrillator: Secondary | ICD-10-CM | POA: Diagnosis not present

## 2015-08-10 DIAGNOSIS — I5023 Acute on chronic systolic (congestive) heart failure: Secondary | ICD-10-CM

## 2015-08-10 DIAGNOSIS — E785 Hyperlipidemia, unspecified: Secondary | ICD-10-CM

## 2015-08-10 MED ORDER — TORSEMIDE 20 MG PO TABS: 20.0000 mg | ORAL_TABLET | Freq: Two times a day (BID) | ORAL | Status: DC

## 2015-08-10 NOTE — Progress Notes (Signed)
Patient ID: SIDDHI MCCONNICO, female   DOB: 01/11/1934, 82 y.o.   MRN: [id number]      SUBJECTIVE: The patient presents for follow-up of acute on chronic systolic heart failure and coronary artery disease.   Chest x-ray on 07/21/15 showed low-grade CHF. I had her take extra doses of torsemide x 2 days.  She has undergone prior stenting with her most recent coronary angiogram performed in August 2013 which showed left main 40%, LAD 50% ostial with patent stent in prox LAD, D2 90%, LCX occluded in mid vessel with left to left collaterals, RCA occluded proximally at prior stent site. She has been medically managed.  Echocardiogram on 06/05/15 demonstrated that left ventricular systolic function had normalized when compared to July 2014 , LVEF 55%.  Nuclear stress test on 06/10/15 demonstrated extensive prior myocardial scar in the anterior, lateral, and inferior walls, with very minimal peri-infarct ischemia in the anterior wall.  Wt 164 lbs (161 on 07/21/15).  She continues to feel short of breath and has had to use oxygen more frequently. She denies chest pain and leg swelling.  Review of Systems: As per "subjective", otherwise negative.  Allergies  Allergen Reactions  . Augmentin [Amoxicillin-Pot Clavulanate]     Abdominal pain  . Metformin     Upset stomach  . Metolazone     "made everything hurt" 07/03/15  . Sulfonamide Derivatives     swelling    Current Outpatient Prescriptions  Medication Sig Dispense Refill  . ALPRAZolam (XANAX) 0.5 MG tablet Take 0.5 mg by mouth at bedtime as needed for anxiety.    . Ascorbic Acid (VITAMIN C) 100 MG tablet Take 100 mg by mouth daily.    Marland Kitchen aspirin 81 MG chewable tablet Chew 1 tablet (81 mg total) by mouth daily.    . carvedilol (COREG) 3.125 MG tablet TAKE ONE TABLET BY MOUTH TWICE DAILY WITH A MEAL 180 tablet 0  . citalopram (CELEXA) 20 MG tablet Take 20 mg by mouth daily.    Marland Kitchen HYDROmorphone (DILAUDID) 4 MG tablet Take 6 mg by mouth  every 6 (six) hours as needed for severe pain.     Marland Kitchen insulin glargine (LANTUS) 100 UNIT/ML injection Inject 40 Units into the skin 2 (two) times daily.     . nitroGLYCERIN (NITROSTAT) 0.4 MG SL tablet Place 1 tablet (0.4 mg total) under the tongue every 5 (five) minutes as needed for chest pain. 25 tablet 3  . OXYGEN-HELIUM IN 2 L as needed.    . ranitidine (ZANTAC) 150 MG tablet Take 150 mg by mouth 2 (two) times daily.     Marland Kitchen torsemide (DEMADEX) 20 MG tablet Take 1 tablet (20 mg total) by mouth daily.     No current facility-administered medications for this visit.    Past Medical History  Diagnosis Date  . Type 2 diabetes mellitus ([place])   . Anxiety disorder   . GERD (gastroesophageal reflux disease)   . Hyperlipidemia     H/o GI intolerance to Lipitor  . Ischemic cardiomyopathy     s/p Medtronic BiV ICD 2006  . Systolic and diastolic CHF, chronic (HCC)     echo 03/2012 EF 123456, mod diastolic dysfunction, mod MR, mod LAE, mod-severe TR, PASP 28mmHg  . Impingement syndrome of left shoulder   . Aneurysm ([place])     Apical; previously on Coumadin  . Chronic renal insufficiency     Creatinine 1.17 January 2010  . Peripheral polyneuropathy (HCC)     Refractory to  several medications  . Coronary artery disease     Cath 02/15/12 revealed RCA as culprit lesion w/ occlusion at site of prior stent, patent LAD stent, chronic LCx and Diagonal dz; Medical therapy   . Depression   . Cancer [facility])     Skin    Past Surgical History  Procedure Laterality Date  . Abdominal hysterectomy    . Cardiac defibrillator placement  05/28/2009    Medtronic ICD placed by Dr. Caryl Comes secondary to ICM/CHF, [phone] Fidelis lead fracture with ICD storm 2010 requiring new RV lead placement  . Implantable cardioverter defibrillator generator change  09/26/13    BiV ICD generator change by Dr Rayann Heman - MDT [place] CRT-D  . Cholecystectomy    . Eye surgery    . Left heart catheterization with coronary angiogram N/A  02/15/2012    Procedure: LEFT HEART CATHETERIZATION WITH CORONARY ANGIOGRAM;  Surgeon: Peter M Martinique, MD;  Location: [facility] CATH LAB;  Service: Cardiovascular;  Laterality: N/A;  . Right heart catheterization N/A 04/27/2012    Procedure: RIGHT HEART CATH;  Surgeon: Larey Dresser, MD;  Location: [facility] CATH LAB;  Service: Cardiovascular;  Laterality: N/A;  . Implantable cardioverter defibrillator generator change N/A 09/26/2013    Procedure: IMPLANTABLE CARDIOVERTER DEFIBRILLATOR GENERATOR CHANGE;  Surgeon: Coralyn Mark, MD;  Location: [facility] C[facility] LAB;  Service: Cardiovascular;  Laterality: N/A;    Social History   Social History  . Marital Status: Widowed    Spouse Name: N/A  . Number of Children: N/A  . Years of Education: N/A   Occupational History  . Retired    Social History Main Topics  . Smoking status: Former Smoker -- 0.80 packs/day for 30 years    Types: Cigarettes    Start date: 04/05/1957    Quit date: 06/27/2002  . Smokeless tobacco: Never Used  . Alcohol Use: No  . Drug Use: No  . Sexual Activity: Not Currently   Other Topics Concern  . Not on file   Social History Narrative     Filed Vitals:   08/10/15 1344  BP: 128/60  Pulse: 60  Height: 5\' 6"  (1.676 m)  Weight: 164 lb (74.39 kg)  SpO2: 93%    PHYSICAL EXAM General: NAD HEENT: Normal. Neck: No JVD, no thyromegaly. Lungs: Prolonged expiratory phase with bilateral wheezes, no rales. CV: Nondisplaced PMI. Regular rate and rhythm, normal S1/S2, no S3/S4, no murmur. No pretibial or periankle edema.  Abdomen: Soft, nontender, no distention.  Neurologic: Alert and oriented x 3.  Psych: Normal affect. Skin: Normal. Musculoskeletal: No gross deformities. Extremities: No clubbing or cyanosis.   ECG: Most recent ECG reviewed.      ASSESSMENT AND PLAN: 1. CAD with ischemic cardiomyopathy: Stable ischemic heart disease with LVEF normalization. Continue ASA and Coreg.   2. Shortness of breath/Acute on  chronic systolic and diastolic heart failure: Weight up 3 lbs and she is symptomatic. Increase torsemide to 20 mg twice daily. Check BMET on 2/15 to see if K repletement is warranted.  3. Hyperlipidemia: Has not wanted to try alternative statin therapy.  4. CRT-D: Functioning normally. Followed by Dr. Rayann Heman.  Dispo: f/u 2-3 weeks.  Kate Sable, M.D., F.A.C.C.

## 2015-08-10 NOTE — Patient Instructions (Signed)
   Increase your Tosemide to 20mg  twice a day  - new sent to [facility] today. Continue all other medications.   Lab for BMET - do this Wednesday - order given today.  Office will contact with results via phone or letter.   Follow up in  2-3 weeks.

## 2015-08-13 ENCOUNTER — Encounter: Payer: Self-pay | Admitting: *Deleted

## 2015-08-17 ENCOUNTER — Telehealth: Payer: Self-pay | Admitting: Cardiovascular Disease

## 2015-08-17 DIAGNOSIS — I5023 Acute on chronic systolic (congestive) heart failure: Secondary | ICD-10-CM

## 2015-08-17 NOTE — Telephone Encounter (Signed)
Is weight the same as in office or has it gone up? I increased diuretic to twice daily at ov.

## 2015-08-17 NOTE — Telephone Encounter (Signed)
Increase torsemide to 40 mg bid x 3 days. Check BMET on day 3.

## 2015-08-17 NOTE — Telephone Encounter (Signed)
Yes, she stated her weight was the same.

## 2015-08-17 NOTE — Telephone Encounter (Signed)
[name] is calling wanting to know if we have recent lab results back. She states that she has been feeling bad all weekend.

## 2015-08-17 NOTE — Telephone Encounter (Signed)
Patient notified that her labs were stable.  Letter was mailed on 08/13/2015.  All weekend, she has felt weak, unsteady on her feet, thinking is not as good.  SOB about the same.  Not sure about the dizziness, she just describes as unsteady.  No chest pain.  Weight the same.  States sugar readings have been good.  Follow up is scheduled for 09/01/2015.  BP this morning 122/69  72.  Message fwd to provider for further advice.

## 2015-08-17 NOTE — Telephone Encounter (Signed)
Patient notified.  Lab order faxed to [facility] today.  Patient cont'd to c/o weakness & just not feeling good.  Advised her if symptoms worsened, go to ED / call 911 for evaluation.  Patient verbalized understanding.

## 2015-08-20 ENCOUNTER — Telehealth: Payer: Self-pay

## 2015-08-20 ENCOUNTER — Ambulatory Visit (INDEPENDENT_AMBULATORY_CARE_PROVIDER_SITE_OTHER): Payer: Medicare Other

## 2015-08-20 DIAGNOSIS — I5023 Acute on chronic systolic (congestive) heart failure: Secondary | ICD-10-CM | POA: Diagnosis not present

## 2015-08-20 DIAGNOSIS — Z95810 Presence of automatic (implantable) cardiac defibrillator: Secondary | ICD-10-CM

## 2015-08-20 NOTE — Telephone Encounter (Signed)
Remote ICM transmission received.  Attempted patient call and left message for return call.   

## 2015-08-21 ENCOUNTER — Telehealth: Payer: Self-pay | Admitting: *Deleted

## 2015-08-21 NOTE — Telephone Encounter (Signed)
-----   Message from Laurine Blazer, LPN sent at [id number]  4:55 PM EST -----   ----- Message -----    From: Herminio Commons, MD    Sent: 08/20/2015   1:45 PM      To: Staci T Ashworth, CMA  Stable.

## 2015-08-21 NOTE — Telephone Encounter (Signed)
Notes Recorded by Laurine Blazer, LPN on [id number] at [id number] PM Patient notified. Copy to pmd.

## 2015-08-21 NOTE — Progress Notes (Signed)
EPIC Encounter for ICM Monitoring  Patient Name: [name] is a 82 y.o. female Date: 08/21/2015 Primary Care Physican: Deloria Lair, MD Primary Cardiologist: Bronson Ing Electrophysiologist: Allred Dry Weight: 160 lbs  Bi-V Pacing 97.2%       In the past month, have you:  1. Gained more than 2 pounds in a day or more than 5 pounds in a week? no  2. Had changes in your medications (with verification of current medications)? no  3. Had more shortness of breath than is usual for you? Yes, she is using oxygen full time due to breathing difficulty  4. Limited your activity because of shortness of breath? Yes, due to SOB and weakness  5. Not been able to sleep because of shortness of breath? no  6. Had increased swelling in your feet or ankles? no  7. Had symptoms of dehydration (dizziness, dry mouth, increased thirst, decreased urine output) no  8. Had changes in sodium restriction? no  9. Been compliant with medication? Yes   ICM trend: 3 month view for 08/20/2015   ICM trend: 1 year view for 08/19/2012   Follow-up plan: ICM clinic phone appointment on 08/26/2015.  Optivol thoracic impedance below reference line starting 07/31/2015 suggesting fluid accumulation.  Patient continues to be short of breath and using oxygen full time.  She stated she continues to feel weak and unsure why.  She did not increase Torsemide to 40 mg bid x 3 days as directed on 08/17/2015 by Dr Bronson Ing.    She stated she did not understand why labs were ordered twice this month.  Explained labs were to check potassium and creatinine after she was supposed to increase Torsemide.  She stated she is too weak to go make another trip to get labs again.     Advised would send to Dr Bronson Ing and Dr Rayann Heman for review and to ask if should increase Torsemide to 40 mg bid x 3 days since she did not do that on 08/17/2015.  She stated she does not want to get more lab work because she is too weak to make a  special trip.  Advised would call back with any recommendations.  Repeat transmission on 08/26/2015.     Copy of note sent to patient's primary care physician, primary cardiologist, and device following physician.  Rosalene Billings, RN, CCM 08/21/2015 1:04 PM   Herminio Commons, MD   Sent: [name] August 21, 2015 1:44 PM    To: Rosalene Billings, RN        Message     Agree with you, [name], regarding increasing torsemide x 3 days.    Patient notified to increase Torsemide as planned to 40 mg bid x 3 days and then return to prescribed dosage of 20 mg bid.  She verbalized understanding.  Explained if she gets worse over the weekend to use 911.  Recommended for her to call if she is unable to take increased Torsemide dosages.  Repeat transmission on 08/26/2015.

## 2015-08-26 ENCOUNTER — Telehealth: Payer: Self-pay

## 2015-08-26 ENCOUNTER — Ambulatory Visit (INDEPENDENT_AMBULATORY_CARE_PROVIDER_SITE_OTHER): Payer: Medicare Other

## 2015-08-26 DIAGNOSIS — I5023 Acute on chronic systolic (congestive) heart failure: Secondary | ICD-10-CM

## 2015-08-26 DIAGNOSIS — Z95810 Presence of automatic (implantable) cardiac defibrillator: Secondary | ICD-10-CM

## 2015-08-26 NOTE — Telephone Encounter (Signed)
Spoke with patient.

## 2015-08-26 NOTE — Progress Notes (Signed)
EPIC Encounter for ICM Monitoring  Patient Name: [name] is a 82 y.o. female Date: 08/26/2015 Primary Care Physican: Deloria Lair, MD Primary Cardiologist: Bronson Ing Electrophysiologist: Allred Dry Weight: 160 lbs  Bi-V Pacing 96%      In the past month, have you:  1. Gained more than 2 pounds in a day or more than 5 pounds in a week? no  2. Had changes in your medications (with verification of current medications)? Yes, Furosemide stopped by Dr Bronson Ing and Torsemide 20 mg bid.    3. Had more shortness of breath than is usual for you? Yes, in the last month  4. Limited your activity because of shortness of breath? Yes, wearing oxygen most of the time  5. Not been able to sleep because of shortness of breath? no  6. Had increased swelling in your feet or ankles? no  7. Had symptoms of dehydration (dizziness, dry mouth, increased thirst, decreased urine output) no  8. Had changes in sodium restriction? no  9. Been compliant with medication? Yes   ICM trend: 3 month view for 08/26/2015 transmission   ICM trend: 1 year view for 08/26/2015 transmission    Follow-up plan: ICM clinic phone appointment 09/09/2015.  Optivol thoracic impedance continues below reference line since last transmission on 08/21/2015 suggesting fluid accumulation.  She only doubled her Torsemide x 1 day instead of 3 days and took 60 mg the other 2 days.  She was unable to double it for 3 days due to she feels so weak.  She went back to her normal prescribed Torsemide dosage on 08/24/2015 which is 20 mg bid.  She stated she feels the same as last week and denied any worsening of SOB.  She denied weight gain or lower extremity swelling.  She continues to use Oxygen most of the time and weak.      Advised would send to Dr Bronson Ing and Dr Rayann Heman for review and recommendations.  Appointment scheduled with Dr Bronson Ing on 09/01/2015.     Copy of note sent to patient's primary care physician, primary  cardiologist, and device following physician.  Rosalene Billings, RN, CCM 08/26/2015 2:15 PM

## 2015-08-26 NOTE — Telephone Encounter (Signed)
Remote ICM transmission received.  Attempted patient call and left message for return call.   

## 2015-08-26 NOTE — Telephone Encounter (Signed)
Spoke with patient on 08/21/2015

## 2015-08-28 NOTE — Progress Notes (Signed)
Call to patient.  Notified patient to take Metolazone 2.5 mg one tablet daily x 3 days 30 minutes before Torsemide.  She verbalized understanding.  Will recheck fluid levels on 08/31/2015 which is day before her appointment with Dr Bronson Ing.  She will send manual transmission.     Attempted patient call to notify of Dr Court Joy orders  Herminio Commons, MD  Rosalene Billings, RN            Thanks [name]. Can you ask her to take 2.5 mg metolazone daily x 3 days? Let's reassess Optivol after that. She may need to take it every Mon, Wed, and Fri going forward.   Dr. Bronson Ing

## 2015-08-31 ENCOUNTER — Telehealth: Payer: Self-pay

## 2015-08-31 ENCOUNTER — Ambulatory Visit (INDEPENDENT_AMBULATORY_CARE_PROVIDER_SITE_OTHER): Payer: Medicare Other

## 2015-08-31 DIAGNOSIS — I5023 Acute on chronic systolic (congestive) heart failure: Secondary | ICD-10-CM

## 2015-08-31 DIAGNOSIS — Z95810 Presence of automatic (implantable) cardiac defibrillator: Secondary | ICD-10-CM

## 2015-08-31 NOTE — Telephone Encounter (Signed)
Spoke with patient regarding ICM transmission 

## 2015-08-31 NOTE — Progress Notes (Addendum)
EPIC Encounter for ICM Monitoring  Patient Name: [name] is a 82 y.o. female Date: 08/31/2015 Primary Care Physican: Deloria Lair, MD Primary Cardiologist: Bronson Ing Electrophysiologist: Allred Dry Weight: 159 lb   Bi-V Pacing 96.8%      In the past month, have you:  1. Gained more than 2 pounds in a day or more than 5 pounds in a week? no  2. Had changes in your medications (with verification of current medications)? no  3. Had more shortness of breath than is usual for you? SOB improved since taking Metolazone  4. Limited your activity because of shortness of breath? no  5. Not been able to sleep because of shortness of breath? no  6. Had increased swelling in your feet or ankles? no  7. Had symptoms of dehydration (dizziness, dry mouth, increased thirst, decreased urine output) no  8. Had changes in sodium restriction? no  9. Been compliant with medication? Yes   ICM trend: 3 month view for 08/31/2015   ICM trend: 1 year view for 08/31/2015   Follow-up plan: ICM clinic phone appointment on 09/21/2015.  Optivol thoracic impedance below reference line but trending back toward reference line after taking Metolazone 2.5mg  daily x 3 days.   She reported she is breathing better.     She has appointment with Dr Bronson Ing on 09/01/2015.  Education given to follow low salt diet.  She stated she does have some weakness and feels like sleeping all the time.     Copy of note sent to patient's primary care physician, primary cardiologist, and device following physician.  Rosalene Billings, RN, CCM 08/31/2015 4:41 PM

## 2015-09-01 ENCOUNTER — Other Ambulatory Visit: Payer: Self-pay | Admitting: Cardiovascular Disease

## 2015-09-01 ENCOUNTER — Encounter: Payer: Self-pay | Admitting: Cardiovascular Disease

## 2015-09-01 ENCOUNTER — Ambulatory Visit (INDEPENDENT_AMBULATORY_CARE_PROVIDER_SITE_OTHER): Payer: Medicare Other | Admitting: Cardiovascular Disease

## 2015-09-01 VITALS — BP 125/69 | HR 71 | Ht 66.0 in | Wt 165.0 lb

## 2015-09-01 DIAGNOSIS — I5033 Acute on chronic diastolic (congestive) heart failure: Secondary | ICD-10-CM | POA: Diagnosis not present

## 2015-09-01 DIAGNOSIS — R531 Weakness: Secondary | ICD-10-CM

## 2015-09-01 DIAGNOSIS — E785 Hyperlipidemia, unspecified: Secondary | ICD-10-CM

## 2015-09-01 DIAGNOSIS — I2510 Atherosclerotic heart disease of native coronary artery without angina pectoris: Secondary | ICD-10-CM

## 2015-09-01 DIAGNOSIS — Z95810 Presence of automatic (implantable) cardiac defibrillator: Secondary | ICD-10-CM | POA: Diagnosis not present

## 2015-09-01 DIAGNOSIS — R5383 Other fatigue: Secondary | ICD-10-CM

## 2015-09-01 MED ORDER — METOLAZONE 2.5 MG PO TABS: ORAL_TABLET | ORAL | Status: DC

## 2015-09-01 NOTE — Progress Notes (Signed)
Patient ID: [name], female   DOB: 1933/09/05, 82 y.o.   MRN: [id number]      SUBJECTIVE: The patient presents for follow-up of acute on chronic diastolic heart failure and coronary artery disease. Optivol thoracic impedance trending towards reference line after taking metolazone 2.5 mg x 3 days which stopped yesterday.  Wt 165 (164 lbs on 2/13).  Echocardiogram on 06/05/15 demonstrated that left ventricular systolic function had normalized when compared to July 2014 , LVEF 55%.  Nuclear stress test on 06/10/15 demonstrated extensive prior myocardial scar in the anterior, lateral, and inferior walls, with very minimal peri-infarct ischemia in the anterior wall.  She complains of weakness. She thinks her shortness of breath has improved a little. She believes she is close to her baseline weight and she says she normally weighs between 160 and 170 pounds at home. She is awaiting new scales. She also thinks she has a prior history of COPD but is unwilling to go see a pulmonologist as she says it's too far away.  BNP on 07/03/15 was 2254. Labs on 2/23 showed BUN 18, creatinine 1.07, potassium 4.5.   Review of Systems: As per "subjective", otherwise negative.  Allergies  Allergen Reactions  . Augmentin [Amoxicillin-Pot Clavulanate]     Abdominal pain  . Metformin     Upset stomach  . Metolazone     "made everything hurt" 07/03/15  . Sulfonamide Derivatives     swelling    Current Outpatient Prescriptions  Medication Sig Dispense Refill  . ALPRAZolam (XANAX) 0.5 MG tablet Take 0.5 mg by mouth at bedtime as needed for anxiety.    . Ascorbic Acid (VITAMIN C) 100 MG tablet Take 100 mg by mouth daily.    Marland Kitchen aspirin 81 MG chewable tablet Chew 1 tablet (81 mg total) by mouth daily.    . carvedilol (COREG) 3.125 MG tablet TAKE ONE TABLET BY MOUTH TWICE DAILY WITH A MEAL 180 tablet 0  . citalopram (CELEXA) 20 MG tablet Take 20 mg by mouth daily.    Marland Kitchen HYDROmorphone (DILAUDID) 4 MG  tablet Take 6 mg by mouth every 6 (six) hours as needed for severe pain.     Marland Kitchen insulin glargine (LANTUS) 100 UNIT/ML injection Inject 40 Units into the skin 2 (two) times daily.     . nitroGLYCERIN (NITROSTAT) 0.4 MG SL tablet Place 1 tablet (0.4 mg total) under the tongue every 5 (five) minutes as needed for chest pain. 25 tablet 3  . OXYGEN-HELIUM IN 2 L as needed.    . ranitidine (ZANTAC) 150 MG tablet Take 150 mg by mouth 2 (two) times daily.     Marland Kitchen torsemide (DEMADEX) 20 MG tablet Take 1 tablet (20 mg total) by mouth 2 (two) times daily. 60 tablet 6  . metolazone (ZAROXOLYN) 2.5 MG tablet Take 2.5 mg by mouth daily. Reported on 09/01/2015     No current facility-administered medications for this visit.    Past Medical History  Diagnosis Date  . Type 2 diabetes mellitus ([place])   . Anxiety disorder   . GERD (gastroesophageal reflux disease)   . Hyperlipidemia     H/o GI intolerance to Lipitor  . Ischemic cardiomyopathy     s/p Medtronic BiV ICD 2006  . Systolic and diastolic CHF, chronic (HCC)     echo 03/2012 EF 123456, mod diastolic dysfunction, mod MR, mod LAE, mod-severe TR, PASP 27mmHg  . Impingement syndrome of left shoulder   . Aneurysm ([place])     Apical;  previously on Coumadin  . Chronic renal insufficiency     Creatinine 1.17 January 2010  . Peripheral polyneuropathy (HCC)     Refractory to several medications  . Coronary artery disease     Cath 02/15/12 revealed RCA as culprit lesion w/ occlusion at site of prior stent, patent LAD stent, chronic LCx and Diagonal dz; Medical therapy   . Depression   . Cancer [facility])     Skin    Past Surgical History  Procedure Laterality Date  . Abdominal hysterectomy    . Cardiac defibrillator placement  05/28/2009    Medtronic ICD placed by Dr. Caryl Comes secondary to ICM/CHF, [phone] Fidelis lead fracture with ICD storm 2010 requiring new RV lead placement  . Implantable cardioverter defibrillator generator change  09/26/13    BiV ICD generator  change by Dr Rayann Heman - MDT [place] CRT-D  . Cholecystectomy    . Eye surgery    . Left heart catheterization with coronary angiogram N/A 02/15/2012    Procedure: LEFT HEART CATHETERIZATION WITH CORONARY ANGIOGRAM;  Surgeon: Peter M Martinique, MD;  Location[facility] C[facility] LAB;  Service: Cardiovascular;  Laterality: N/A;  . Right heart catheterization N/A 04/27/2012    Procedure: RIGHT HEART CATH;  Surgeon: Larey Dresser, MD;  Location: [facility] CATH LAB;  Service: Cardiovascular;  Laterality: N/A;  . Implantable cardioverter defibrillator generator change N/A 09/26/2013    Procedure: IMPLANTABLE CARDIOVERTER DEFIBRILLATOR GENERATOR CHANGE;  Surgeon: Coralyn Mark, MD;  Location: [facility] CATH LAB;  Service: Cardiovascular;  Laterality: N/A;    Social History   Social History  . Marital Status: Widowed    Spouse Name: N/A  . Number of Children: N/A  . Years of Education: N/A   Occupational History  . Retired    Social History Main Topics  . Smoking status: Former Smoker -- 0.80 packs/day for 30 years    Types: Cigarettes    Start date: 04/05/1957    Quit date: 06/27/2002  . Smokeless tobacco: Never Used  . Alcohol Use: No  . Drug Use: No  . Sexual Activity: Not Currently   Other Topics Concern  . Not on file   Social History Narrative     Filed Vitals:   09/01/15 1113  BP: 125/69  Pulse: 71  Height: 5\' 6"  (1.676 m)  Weight: 165 lb (74.844 kg)  SpO2: 94%    PHYSICAL EXAM General: NAD HEENT: Normal. Neck: No JVD, no thyromegaly. Lungs: Prolonged expiratory phase with bilateral wheezes, no rales. CV: Nondisplaced PMI. Regular rate and rhythm, normal S1/S2, no S3/S4, no murmur. No pretibial or periankle edema.  Abdomen: Soft, nontender, no distention.  Neurologic: Alert and oriented x 3.  Psych: Normal affect. Skin: Normal. Musculoskeletal: No gross deformities. Extremities: No clubbing or cyanosis.   ECG: Most recent ECG reviewed.      ASSESSMENT AND PLAN: 1. CAD with  ischemic cardiomyopathy: Stable ischemic heart disease with LVEF normalization. Continue ASA and Coreg.   2. Shortness of breath/Acute on chronic diastolic heart failure: Continue torsemide 20 mg twice daily. Will have her take metolazone 2.5 mg daily x 5 days, then 2.5 mg every Mon, Wed, and Friday. Will check BMET and BNP next Monday.  3. Hyperlipidemia: Has not wanted to try alternative statin therapy.  4. CRT-D: Functioning normally. Followed by Dr. Rayann Heman.  Dispo: f/u 1 month.  Kate Sable, M.D., F.A.C.C.

## 2015-09-01 NOTE — Patient Instructions (Signed)
   Continue Torsemide at 20mg  twice a day.  Start your Metolazone today at 2.5mg  daily x 5 days (till Saturday), then change to one tab on Monday, Wednesday, & Friday only. Continue all other medications.   Lab for BNP, BMET - due Monday, 09/07/2015 - orders given today. Office will contact with results via phone or letter.   Follow up in  1 month

## 2015-09-03 ENCOUNTER — Telehealth: Payer: Self-pay | Admitting: *Deleted

## 2015-09-03 NOTE — Telephone Encounter (Signed)
Patient called stating that she isn't able to take metolazone through Saturday. Patient said it made her stomach hurt and caused the neuropathy in her legs to flare up.

## 2015-09-07 MED ORDER — METOLAZONE 2.5 MG PO TABS: ORAL_TABLET | ORAL | Status: DC

## 2015-09-07 MED ORDER — TORSEMIDE 20 MG PO TABS: ORAL_TABLET | ORAL | Status: DC

## 2015-09-07 NOTE — Telephone Encounter (Signed)
Patient calling back again this morning.  Stating that she just can't handle that many days of that medication (Metolazone).  Stated that it makes her legs hurt so bad she can't hardly get out of the bed sometimes.    BMET & BNP done today as previously requested.  (scanned already)  Will forward message to provider for further advice.

## 2015-09-07 NOTE — Telephone Encounter (Signed)
Patient notified.  Stated she will try this & call with update in 1 week.

## 2015-09-07 NOTE — Telephone Encounter (Signed)
Labs look good. She can take the metolazone 2.5mg  every MWF if she is able to handle it, does not have to take daily due to her side effects. Would recommend increasing her torsemide to 40mg  in AM and 20mg  in PM. Have her call us back in 1 week with update on symptoms   Zandra Abts MD

## 2015-09-07 NOTE — Addendum Note (Signed)
Addended by: Laurine Blazer on: 09/07/2015 05:12 PM   Modules accepted: Orders

## 2015-09-21 ENCOUNTER — Ambulatory Visit (INDEPENDENT_AMBULATORY_CARE_PROVIDER_SITE_OTHER): Payer: Medicare Other

## 2015-09-21 DIAGNOSIS — I5033 Acute on chronic diastolic (congestive) heart failure: Secondary | ICD-10-CM | POA: Diagnosis not present

## 2015-09-21 DIAGNOSIS — Z95810 Presence of automatic (implantable) cardiac defibrillator: Secondary | ICD-10-CM | POA: Diagnosis not present

## 2015-09-21 NOTE — Progress Notes (Signed)
EPIC Encounter for ICM Monitoring  Patient Name: [name] is a 82 y.o. female Date: 09/21/2015 Primary Care Physican: Deloria Lair, MD Primary Cardiologist: Bronson Ing Electrophysiologist: Allred Dry Weight: 160 lbs   Bi-V Pacing 97.3%      In the past month, have you:  1. Gained more than 2 pounds in a day or more than 5 pounds in a week? no  2. Had changes in your medications (with verification of current medications)? no  3. Had more shortness of breath than is usual for you? no  4. Limited your activity because of shortness of breath? no  5. Not been able to sleep because of shortness of breath? no  6. Had increased swelling in your feet or ankles? no  7. Had symptoms of dehydration (dizziness, dry mouth, increased thirst, decreased urine output) no  8. Had changes in sodium restriction? Does not follow low salt diet  9. Been compliant with medication? No stopped taking Metolazone that was prescribed 3 days a week.  Last dosage was 09/11/2015   ICM trend: 3 month view for 09/21/2015    ICM trend: 1 year view for 09/21/2015    Follow-up plan: ICM clinic phone appointment on 10/01/2015 and appointment with Dr Bronson Ing on 10/02/2015.    Thoracic impedance below reference line from 09/12/2015 to 09/21/2015 suggesting fluid accumulation which correlates with patient stopping Metolazone due to side effects on 09/11/2015.  She stated she has tried to take the medication but it makes her neuropathy worse and she cannot walk.  She denied any fluid symptoms at this time.  She knows to restrict sodium intake and stated she has been trying.         Advised would send to Dr Bronson Ing and Dr Rayann Heman for review and recommendations.  She is unable to take Metolazone.  Confirmed she takes Torsemide 20 mg two tablets in am and 1 tablet pm.          Repeat  transmission on 10/01/2015 and appointment on 10/02/2015 with Dr Bronson Ing.     Copy of note sent to patient's primary care  physician, primary cardiologist, and device following physician.  Rosalene Billings, RN, CCM 09/21/2015 10:19 AM

## 2015-09-23 NOTE — Progress Notes (Signed)
Shirley Commons, MD  Rosalene Billings, RN            Yes, let's increase torsemide to 40 mg bid x 5 days. Could you get her an appt with NP or PA in advanced HF clinic?  Thx.     Spoke with patient.  Advised Dr Bronson Ing ordered Torsemide 40 mg bid x 5 days and then return to normal prescribed dosage of 2 tablets (40 mg) every am and 1 tablet (20 mg) every pm.  She verbalized understanding.  Advised of recommendation to see NP or PA at advanced HF clinic.  She has declined to go to advanced HF clinic at this time because she is unable to get transportation and she cannot afford to go to another physician.   Explained if she changes her mind I will set up the appointment.   Repeat transmission on 10/01/2015 which is day before appointment with Dr Bronson Ing.

## 2015-10-01 ENCOUNTER — Ambulatory Visit (INDEPENDENT_AMBULATORY_CARE_PROVIDER_SITE_OTHER): Payer: Medicare Other

## 2015-10-01 DIAGNOSIS — Z95810 Presence of automatic (implantable) cardiac defibrillator: Secondary | ICD-10-CM

## 2015-10-01 DIAGNOSIS — I5033 Acute on chronic diastolic (congestive) heart failure: Secondary | ICD-10-CM

## 2015-10-01 NOTE — Progress Notes (Signed)
EPIC Encounter for ICM Monitoring  Patient Name: [name] is a 82 y.o. female Date: 10/01/2015 Primary Care Physican: Deloria Lair, MD Primary Cardiologist: Bronson Ing Electrophysiologist: Allred Dry Weight: 162 lbs and has lost 3 lbs   Bi-V Pacing 97.7%      In the past month, have you:  1. Gained more than 2 pounds in a day or more than 5 pounds in a week? no  2. Had changes in your medications (with verification of current medications)? no  3. Had more shortness of breath than is usual for you? no  4. Limited your activity because of shortness of breath? no  5. Not been able to sleep because of shortness of breath? no  6. Had increased swelling in your feet or ankles? no  7. Had symptoms of dehydration (dizziness, dry mouth, increased thirst, decreased urine output) no  8. Had changes in sodium restriction? no  9. Been compliant with medication? Yes   ICM trend: 3 month view for 10/01/2015   ICM trend: 1 year view for 10/01/2015   Follow-up plan: ICM clinic phone appointment on 11/03/2015.  Since last ICM transmission 09/21/2015, thoracic impedance returned to reference line 09/23/2015 while taking Torsemide 40 mg bid x 5 days.  Thoracic impedance started trending below baseline on 09/24/2015 suggesting return of fluid accumulation.  Patient denied any fluid symptoms.  She reported she does not always limit sodium because she often does not feel like cooking.  She stated she fell yesterday and is sore but denied any injury.  Education given to limit sodium intake to < 2000 mg and fluid intake to 64 oz daily.  Encouraged to call for any fluid symptoms.  No changes today.  She has started using a nebulizer which has helped her breathing.   She has appointment with Dr Bronson Ing on 10/02/2015.  Advised her to tell Dr Bronson Ing that she declined appointment to the Advanced HF clinic due to finances.  Copy of note sent to patient's primary care physician, primary  cardiologist, and device following physician.  Rosalene Billings, RN, CCM 10/01/2015 1:43 PM

## 2015-10-02 ENCOUNTER — Ambulatory Visit (INDEPENDENT_AMBULATORY_CARE_PROVIDER_SITE_OTHER): Payer: Medicare Other | Admitting: Cardiovascular Disease

## 2015-10-02 ENCOUNTER — Encounter: Payer: Self-pay | Admitting: Cardiovascular Disease

## 2015-10-02 ENCOUNTER — Encounter: Payer: Self-pay | Admitting: *Deleted

## 2015-10-02 VITALS — BP 110/60 | HR 74 | Ht 66.0 in | Wt 165.0 lb

## 2015-10-02 DIAGNOSIS — I5033 Acute on chronic diastolic (congestive) heart failure: Secondary | ICD-10-CM | POA: Diagnosis not present

## 2015-10-02 DIAGNOSIS — I2510 Atherosclerotic heart disease of native coronary artery without angina pectoris: Secondary | ICD-10-CM

## 2015-10-02 DIAGNOSIS — R531 Weakness: Secondary | ICD-10-CM

## 2015-10-02 DIAGNOSIS — E785 Hyperlipidemia, unspecified: Secondary | ICD-10-CM | POA: Diagnosis not present

## 2015-10-02 DIAGNOSIS — Z95810 Presence of automatic (implantable) cardiac defibrillator: Secondary | ICD-10-CM | POA: Diagnosis not present

## 2015-10-02 DIAGNOSIS — Z87898 Personal history of other specified conditions: Secondary | ICD-10-CM

## 2015-10-02 DIAGNOSIS — R5383 Other fatigue: Secondary | ICD-10-CM

## 2015-10-02 DIAGNOSIS — Z9289 Personal history of other medical treatment: Secondary | ICD-10-CM

## 2015-10-02 MED ORDER — METOLAZONE 2.5 MG PO TABS: ORAL_TABLET | ORAL | Status: DC

## 2015-10-02 NOTE — Patient Instructions (Signed)
Your physician recommends that you schedule a follow-up appointment in: [place]  Your physician has recommended you make the following change in your medication:   TAKE METOLAZONE 2.5 MG [place]   Thank you for choosing [facility]!!

## 2015-10-02 NOTE — Progress Notes (Signed)
Patient ID: [name], female   DOB: 1933-11-15, 82 y.o.   MRN: [id number]      SUBJECTIVE: The patient presents for follow-up of acute on chronic diastolic heart failure and coronary artery disease.  Recently had increased torsemide 40 mg twice daily for 5 days. She reports she does not always limit sodium intake because she does not feel like cooking very often. She declined going to the advanced heart failure clinic due to finances.  Wt 165 (165 lbs on 3/7).  Echocardiogram on 06/05/15 demonstrated that left ventricular systolic function had normalized when compared to July 2014 , LVEF 55%.  Nuclear stress test on 06/10/15 demonstrated extensive prior myocardial scar in the anterior, lateral, and inferior walls, with very minimal peri-infarct ischemia in the anterior wall.  Recently hospitalized for a COPD exacerbation, but I do not have these records from [place]. She previously reported weakness with metolazone but volume status always increases when she is off of it or torsemide dosage is reduced.    Review of Systems: As per "subjective", otherwise negative.  Allergies  Allergen Reactions  . Augmentin [Amoxicillin-Pot Clavulanate]     Abdominal pain  . Metformin     Upset stomach  . Metolazone     "made everything hurt" 07/03/15  . Sulfonamide Derivatives     swelling    Current Outpatient Prescriptions  Medication Sig Dispense Refill  . ALPRAZolam (XANAX) 0.5 MG tablet Take 0.5 mg by mouth at bedtime as needed for anxiety.    . Ascorbic Acid (VITAMIN C) 100 MG tablet Take 100 mg by mouth daily.    Marland Kitchen aspirin 81 MG chewable tablet Chew 1 tablet (81 mg total) by mouth daily.    . carvedilol (COREG) 3.125 MG tablet TAKE ONE TABLET BY MOUTH TWICE DAILY WITH A MEAL 180 tablet 3  . citalopram (CELEXA) 20 MG tablet Take 20 mg by mouth daily.    Marland Kitchen HYDROmorphone (DILAUDID) 4 MG tablet Take 6 mg by mouth every 6 (six) hours as needed for severe pain.     Marland Kitchen insulin  glargine (LANTUS) 100 UNIT/ML injection Inject 40 Units into the skin 2 (two) times daily.     . Nebulizer MISC Inhale into the lungs.    . nitroGLYCERIN (NITROSTAT) 0.4 MG SL tablet Place 1 tablet (0.4 mg total) under the tongue every 5 (five) minutes as needed for chest pain. 25 tablet 3  . OXYGEN-HELIUM IN 2 L as needed.    . ranitidine (ZANTAC) 150 MG tablet Take 150 mg by mouth 2 (two) times daily.     Marland Kitchen torsemide (DEMADEX) 20 MG tablet Take 2 tabs (40mg ) by mouth every morning & 1 tab (20mg ) every evening    . metolazone (ZAROXOLYN) 2.5 MG tablet TAKE 1 TAB MONDAYS, WEDNESDAYS, FridayS 30 tablet 1   No current facility-administered medications for this visit.    Past Medical History  Diagnosis Date  . Type 2 diabetes mellitus ([place])   . Anxiety disorder   . GERD (gastroesophageal reflux disease)   . Hyperlipidemia     H/o GI intolerance to Lipitor  . Ischemic cardiomyopathy     s/p Medtronic BiV ICD 2006  . Systolic and diastolic CHF, chronic (HCC)     echo 03/2012 EF 123456, mod diastolic dysfunction, mod MR, mod LAE, mod-severe TR, PASP 9mmHg  . Impingement syndrome of left shoulder   . Aneurysm ([place])     Apical; previously on Coumadin  . Chronic renal insufficiency  Creatinine 1.17 January 2010  . Peripheral polyneuropathy (HCC)     Refractory to several medications  . Coronary artery disease     Cath 02/15/12 revealed RCA as culprit lesion w/ occlusion at site of prior stent, patent LAD stent, chronic LCx and Diagonal dz; Medical therapy   . Depression   . Cancer [facility])     Skin    Past Surgical History  Procedure Laterality Date  . Abdominal hysterectomy    . Cardiac defibrillator placement  05/28/2009    Medtronic ICD placed by Dr. Caryl Comes secondary to ICM/CHF, [phone] Fidelis lead fracture with ICD storm 2010 requiring new RV lead placement  . Implantable cardioverter defibrillator generator change  09/26/13    BiV ICD generator change by Dr Rayann Heman - MDT [place] CRT-D  .  Cholecystectomy    . Eye surgery    . Left heart catheterization with coronary angiogram N/A 02/15/2012    Procedure: LEFT HEART CATHETERIZATION WITH CORONARY ANGIOGRAM;  Surgeon: Peter M Martinique, MD;  Location: [facility] CATH LAB;  Service: Cardiovascular;  Laterality: N/A;  . Right heart catheterization N/A 04/27/2012    Procedure: RIGHT HEART CATH;  Surgeon: Larey Dresser, MD;  Location: [facility] CATH LAB;  Service: Cardiovascular;  Laterality: N/A;  . Implantable cardioverter defibrillator generator change N/A 09/26/2013    Procedure: IMPLANTABLE CARDIOVERTER DEFIBRILLATOR GENERATOR CHANGE;  Surgeon: Coralyn Mark, MD;  Location: [facility] CATH LAB;  Service: Cardiovascular;  Laterality: N/A;    Social History   Social History  . Marital Status: Widowed    Spouse Name: N/A  . Number of Children: N/A  . Years of Education: N/A   Occupational History  . Retired    Social History Main Topics  . Smoking status: Former Smoker -- 0.80 packs/day for 30 years    Types: Cigarettes    Start date: 04/05/1957    Quit date: 06/27/2002  . Smokeless tobacco: Never Used  . Alcohol Use: No  . Drug Use: No  . Sexual Activity: Not Currently   Other Topics Concern  . Not on file   Social History Narrative     Filed Vitals:   10/02/15 1109  BP: 110/60  Pulse: 74  Height: 5\' 6"  (1.676 m)  Weight: 165 lb (74.844 kg)  SpO2: 94%    PHYSICAL EXAM General: NAD HEENT: Normal. Neck: No JVD, no thyromegaly. Lungs: Prolonged expiratory phase with faint expiratory bilateral wheezes, no rales. CV: Nondisplaced PMI. Regular rate and rhythm, normal S1/S2, no S3/S4, no murmur. No pretibial or periankle edema.  Abdomen: Soft, nontender, no distention.  Neurologic: Alert and oriented x 3.  Psych: Normal affect. Skin: Normal. Musculoskeletal: No gross deformities. Extremities: No clubbing or cyanosis.   ECG: Most recent ECG reviewed.      ASSESSMENT AND PLAN: 1. CAD with ischemic cardiomyopathy:  Stable ischemic heart disease with LVEF normalization. Continue ASA and Coreg.   2. Shortness of breath/Acute on chronic diastolic heart failure: Continue torsemide 40 mg q am and 20 mg q pm. Encouraged her to take metolazone 2.5 mg every 2.5 mg every Mon, Wed, and Friday to remain euvolemic.  3. Hyperlipidemia: Has not wanted to try alternative statin therapy.  4. CRT-D: Functioning normally. Followed by Dr. Rayann Heman.  Dispo: f/u 3 months.   Kate Sable, M.D., F.A.C.C.

## 2015-10-05 ENCOUNTER — Ambulatory Visit (INDEPENDENT_AMBULATORY_CARE_PROVIDER_SITE_OTHER): Payer: Medicare Other | Admitting: *Deleted

## 2015-10-05 DIAGNOSIS — I255 Ischemic cardiomyopathy: Secondary | ICD-10-CM | POA: Diagnosis not present

## 2015-10-05 DIAGNOSIS — Z95810 Presence of automatic (implantable) cardiac defibrillator: Secondary | ICD-10-CM

## 2015-10-05 NOTE — Progress Notes (Signed)
Remote ICD transmission.   

## 2015-10-27 LAB — CUP PACEART REMOTE DEVICE CHECK
Battery Voltage: 2.96 V
Brady Statistic AP VP Percent: 3.39 %
Brady Statistic AS VS Percent: 1.41 %
Brady Statistic RV Percent Paced: 97.84 %
HIGH POWER IMPEDANCE MEASURED VALUE: 56 Ohm
HighPow Impedance: 69 Ohm
Implantable Lead Implant Date: 20060424
Implantable Lead Implant Date: 20060424
Implantable Lead Implant Date: 20101202
Implantable Lead Location: 753859
Implantable Lead Location: 753860
Implantable Lead Model: 7120
Lead Channel Impedance Value: 342 Ohm
Lead Channel Impedance Value: 4047 Ohm
Lead Channel Impedance Value: 494 Ohm
Lead Channel Pacing Threshold Amplitude: 1 V
Lead Channel Pacing Threshold Amplitude: 1.875 V
Lead Channel Pacing Threshold Pulse Width: 0.4 ms
Lead Channel Pacing Threshold Pulse Width: 0.4 ms
Lead Channel Sensing Intrinsic Amplitude: 0.625 mV
Lead Channel Setting Pacing Amplitude: 2.5 V
Lead Channel Setting Pacing Pulse Width: 0.4 ms
Lead Channel Setting Sensing Sensitivity: 0.3 mV
MDC IDC LEAD LOCATION: 753858
MDC IDC LEAD MODEL: 4194
MDC IDC MSMT BATTERY REMAINING LONGEVITY: 42 mo
MDC IDC MSMT LEADCHNL LV IMPEDANCE VALUE: 4047 Ohm
MDC IDC MSMT LEADCHNL LV IMPEDANCE VALUE: 494 Ohm
MDC IDC MSMT LEADCHNL LV PACING THRESHOLD PULSEWIDTH: 1 ms
MDC IDC MSMT LEADCHNL RA SENSING INTR AMPL: 0.625 mV
MDC IDC MSMT LEADCHNL RV IMPEDANCE VALUE: 494 Ohm
MDC IDC MSMT LEADCHNL RV PACING THRESHOLD AMPLITUDE: 1.25 V
MDC IDC MSMT LEADCHNL RV SENSING INTR AMPL: 13.875 mV
MDC IDC MSMT LEADCHNL RV SENSING INTR AMPL: 13.875 mV
MDC IDC SESS DTM: 20170410041605
MDC IDC SET LEADCHNL LV PACING AMPLITUDE: 3 V
MDC IDC SET LEADCHNL LV PACING PULSEWIDTH: 1 ms
MDC IDC SET LEADCHNL RA PACING AMPLITUDE: 2 V
MDC IDC STAT BRADY AP VS PERCENT: 0.05 %
MDC IDC STAT BRADY AS VP PERCENT: 95.15 %
MDC IDC STAT BRADY RA PERCENT PACED: 3.44 %

## 2015-11-02 ENCOUNTER — Ambulatory Visit (INDEPENDENT_AMBULATORY_CARE_PROVIDER_SITE_OTHER): Payer: Medicare Other

## 2015-11-02 DIAGNOSIS — Z95810 Presence of automatic (implantable) cardiac defibrillator: Secondary | ICD-10-CM | POA: Diagnosis not present

## 2015-11-02 DIAGNOSIS — I5022 Chronic systolic (congestive) heart failure: Secondary | ICD-10-CM

## 2015-11-02 NOTE — Progress Notes (Addendum)
EPIC Encounter for ICM Monitoring  Patient Name: [name] is a 82 y.o. female Date: 11/02/2015 Primary Care Physican: Deloria Lair, MD Primary Cardiologist: Bronson Ing Electrophysiologist: Allred Dry Weight: 162 lbs    Bi-V Pacing 97.6%      In the past month, have you:  1. Gained more than 2 pounds in a day or more than 5 pounds in a week? no  2. Had changes in your medications (with verification of current medications)? no  3. Had more shortness of breath than is usual for you? no  4. Limited your activity because of shortness of breath? no  5. Not been able to sleep because of shortness of breath? no  6. Had increased swelling in your feet, ankles, legs or stomach area? no  7. Had symptoms of dehydration (dizziness, dry mouth, increased thirst, decreased urine output) no  8. Had changes in sodium restriction? no  9. Been compliant with medication? Yes  ICM trend: 3 month view for 11/02/2015  ICM trend: 1 year view for 11/02/2015  Follow-up plan: ICM clinic phone appointment 11/06/2015.     FLUID LEVELS:   Optivol thoracic impedance decreased 10/19/2015 to 11/02/2015 suggesting fluid accumulation.    SYMPTOMS:    None. Denied any symptoms such as weight gain, SOB and/or lower extremity swelling. Encouraged to call for any fluid symptoms.  She still has cast on her left hand and goes back to orthopedic surgeon today.    EDUCATION:   Encouraged to limit sodium intake to < 2000 mg and fluid intake to 64 oz daily.     LABS:  09/21/2015 Creatinine 1.18, BUN 22, Potassium 4.6, eGFR 44.    Patient unable to take Metolazone for increased fluid as prescribed due to it makes her too weak.   Advised to increase Torsemide 40 mg to bid x 3 days and then return to prescribed dosage of 40 mg in am and 20 mg pm.  Repeat transmission 11/06/2015.    Advised will send to PCP, primary cardiologist and device following physician for review of decreased thoracic impedance.  Will call  back if any further recommendations.    Rosalene Billings, RN, CCM 11/02/2015 12:18 PM   Agree with increased torsemide dose.          ----- Message -----     From: Rosalene Billings, RN     Sent: 11/02/2015 12:50 PM      To: Herminio Commons, MD

## 2015-11-04 ENCOUNTER — Encounter: Payer: Self-pay | Admitting: Cardiology

## 2015-11-06 ENCOUNTER — Telehealth: Payer: Self-pay | Admitting: Cardiology

## 2015-11-06 ENCOUNTER — Ambulatory Visit (INDEPENDENT_AMBULATORY_CARE_PROVIDER_SITE_OTHER): Payer: Medicare Other

## 2015-11-06 DIAGNOSIS — I5022 Chronic systolic (congestive) heart failure: Secondary | ICD-10-CM

## 2015-11-06 DIAGNOSIS — Z95810 Presence of automatic (implantable) cardiac defibrillator: Secondary | ICD-10-CM

## 2015-11-06 NOTE — Telephone Encounter (Signed)
Spoke with pt and reminded pt of remote transmission that is due today. Pt verbalized understanding.   

## 2015-11-09 NOTE — Progress Notes (Signed)
EPIC Encounter for ICM Monitoring  Patient Name: [name] is a 82 y.o. female Date: 11/09/2015 Primary Care Physican: Deloria Lair, MD Primary Cardiologist: Bronson Ing Electrophysiologist: Allred  Dry Weight: 162 lbs   Bi-V Pacing 97.4%      In the past month, have you:  1. Gained more than 2 pounds in a day or more than 5 pounds in a week? no  2. Had changes in your medications (with verification of current medications)? no  3. Had more shortness of breath than is usual for you? no  4. Limited your activity because of shortness of breath? no  5. Not been able to sleep because of shortness of breath? no  6. Had increased swelling in your feet, ankles, legs or stomach area? no  7. Had symptoms of dehydration (dizziness, dry mouth, increased thirst, decreased urine output) no  8. Had changes in sodium restriction? no  9. Been compliant with medication? Yes  ICM trend: 3 month view for 11/08/2015   ICM trend: 1 year view for 11/08/2015   Follow-up plan: ICM clinic phone appointment 11/13/2015.    FLUID LEVELS:  Since last ICM remote transmission 11/02/2015, Optivol thoracic impedance continues below reference line suggesting fluid accumulation despite increase in Torsemide 40 mg bid x 3 days from 11/02/2015 to 11/05/2015.    SYMPTOMS:  None.  Denied any symptoms such as weight gain of 3 pounds overnight or 5 pounds within a week, SOB and/or lower extremity swelling.   Encouraged to call for any fluid symptoms.   EDUCATION:  She is aware she should limit sodium intake to < 2000 mg and fluid intake to 64 oz daily.       LABS: 07/03/2015 Creatinine 1.26, Potassium 5.2, Sodium 134, eGFR 41 08/20/2015 Creatinine 1.07, Potassium 4.5, Sodium 140, eGFR 49 09/07/2015 BNP 1140 09/07/2015 Creatinine 1.25, Potassium 4.6, Sodium 134, eGFR 41 09/21/2015 Creatinine 1.18, Potassium 4.6, Sodium 135, eGFR 44  Recommendation to increase Torsemide to 60 mg bid x 3 days and return to  prescribed dosage of 40 mg am and 20 mg pm.  Patient unable to take Metolazone due to it causes her to feel weak.     Advised will send to PCP, primary cardiologist and device following physician for review continued decreased thoracic impedance.  If any further recommendations, will call back.    Rosalene Billings, RN, CCM 11/09/2015 8:47 AM

## 2015-11-10 NOTE — Progress Notes (Signed)
Agree with your recommendations, [name].     Would really try and encourage her to see advanced HF team in [facility] for her own benefit.        ----- Message -----     From: Rosalene Billings, RN     Sent: 11/09/2015  9:18 AM      To: Herminio Commons, MD

## 2015-11-13 ENCOUNTER — Ambulatory Visit (INDEPENDENT_AMBULATORY_CARE_PROVIDER_SITE_OTHER): Payer: Medicare Other

## 2015-11-13 DIAGNOSIS — Z95810 Presence of automatic (implantable) cardiac defibrillator: Secondary | ICD-10-CM

## 2015-11-13 DIAGNOSIS — I5022 Chronic systolic (congestive) heart failure: Secondary | ICD-10-CM

## 2015-11-13 NOTE — Progress Notes (Signed)
EPIC Encounter for ICM Monitoring  Patient Name: [name] is a 82 y.o. female Date: 11/13/2015 Primary Care Physican: Deloria Lair, MD Primary Cardiologist: Bronson Ing Electrophysiologist: Allred Dry Weight: 164 lbs  Bi-V Pacing 97.3%      In the past month, have you:  1. Gained more than 2 pounds in a day or more than 5 pounds in a week? no  2. Had changes in your medications (with verification of current medications)? no  3. Had more shortness of breath than is usual for you? no  4. Limited your activity because of shortness of breath? no  5. Not been able to sleep because of shortness of breath? no  6. Had increased swelling in your feet, ankles, legs or stomach area? no  7. Had symptoms of dehydration (dizziness, dry mouth, increased thirst, decreased urine output) no  8. Had changes in sodium restriction? no  9. Been compliant with medication? Yes   ICM trend: 3 month view for 11/13/2015   ICM trend: 1 year view for 11/13/2015   Follow-up plan: ICM clinic phone appointment 11/18/2015.    FLUID LEVELS:  Since 11/06/2015 ICM transmission, Optivol thoracic impedance continues to be decreased starting 10/21/2015 suggesting fluid accumulation despite Torsemide increase (40 mg bid) on 11/02/2015 x 3 days and Torsemide increase to 60 mg bid on 11/06/2015 x 3 days.  She reported she has continued taking 1 extra Torsemide 20 daily since then as well.     SYMPTOMS:  None.  Denied any symptoms such as weight gain of 3 pounds overnight or 5 pounds within a week, SOB and/or lower extremity swelling.   Encouraged patient to consider making a Advanced HF clinic appointment and she reported she cannot financially afford to go to any more physicians.    RECOMMENDATIONS:   Patient reported she is willing to try a 1/2 tablet of Metolazone 2.5 mg even though it may cause her to have weakness.  Advised will check with Dr Bronson Ing before taking Metolazone.    LABS:  09/21/2015  Creatinine 1.18, BUN 22, Potassium 4.6, eGFR 44  Advised will send to PCP, Dr Bronson Ing and Dr Rayann Heman for review continuation of decreased thoracic impedance.  If any recommendations, will call back.    Rosalene Billings, RN, CCM 11/13/2015 11:20 AM

## 2015-11-13 NOTE — Progress Notes (Signed)
Herminio Commons, MD  Rosalene Billings, RN            Do nothing unless she becomes symptomatic. In that case, would increase torsemide.     Call to patient and advised to call if she develops fluid symptoms.  Repeat transmission on 11/18/2015.

## 2015-11-18 ENCOUNTER — Telehealth: Payer: Self-pay | Admitting: Cardiology

## 2015-11-18 ENCOUNTER — Ambulatory Visit (INDEPENDENT_AMBULATORY_CARE_PROVIDER_SITE_OTHER): Payer: Medicare Other

## 2015-11-18 DIAGNOSIS — Z95810 Presence of automatic (implantable) cardiac defibrillator: Secondary | ICD-10-CM

## 2015-11-18 DIAGNOSIS — I5022 Chronic systolic (congestive) heart failure: Secondary | ICD-10-CM

## 2015-11-18 NOTE — Telephone Encounter (Signed)
Spoke with pt and reminded pt of remote transmission that is due today. Pt verbalized understanding.   

## 2015-11-19 NOTE — Progress Notes (Signed)
EPIC Encounter for ICM Monitoring  Patient Name: [name] is a 82 y.o. female Date: 11/19/2015 Primary Care Physican: Deloria Lair, MD Primary Cardiologist: Bronson Ing Electrophysiologist: Allred Dry Weight: 159 lbs   Bi-V Pacing 97.8%      In the past month, have you:              Gained more than 2 pounds in a day or more than 5 pounds in a week? Lost weight   1. Had changes in your medications (with verification of current medications)? no  2. Had more shortness of breath than is usual for you? no  3. Limited your activity because of shortness of breath? no  4. Not been able to sleep because of shortness of breath? no  5. Had increased swelling in your feet, ankles, legs or stomach area? no  6. Had symptoms of dehydration (dizziness, dry mouth, increased thirst, decreased urine output) no  7. Had changes in sodium restriction? no  8. Been compliant with medication? Yes  ICM trend: 3 month view for 11/18/2015  ICM trend: 1 year view for 11/18/2015  Follow-up plan: ICM clinic phone appointment 12/23/2015.    FLUID LEVELS:  Since last ICM transmission 11/13/2015, Optivol thoracic impedance returned to baseline.    SYMPTOMS: None.  Denied any symptoms such as weight gain of 3 pounds overnight or 5 pounds within a week, SOB and/or lower extremity swelling. Encouraged to call for any fluid symptoms.   EDUCATION: Limit sodium intake to < 2000 mg and fluid intake to 64 oz daily.     RECOMMENDATIONS: No changes today.    Advised will send to PCP, Dr Bronson Ing and Dr Rayann Heman to review updated transmission and impedance has returned to baseline.    Rosalene Billings, RN, CCM 11/19/2015 9:47 AM

## 2015-12-23 ENCOUNTER — Telehealth: Payer: Self-pay | Admitting: Cardiology

## 2015-12-23 ENCOUNTER — Ambulatory Visit (INDEPENDENT_AMBULATORY_CARE_PROVIDER_SITE_OTHER): Payer: Medicare Other

## 2015-12-23 DIAGNOSIS — I5022 Chronic systolic (congestive) heart failure: Secondary | ICD-10-CM

## 2015-12-23 DIAGNOSIS — Z95810 Presence of automatic (implantable) cardiac defibrillator: Secondary | ICD-10-CM

## 2015-12-23 NOTE — Telephone Encounter (Signed)
Attempted to confirm remote transmission with pt. No answer and was unable to leave a message.   
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
## 2016-01-11 ENCOUNTER — Encounter: Payer: Medicare Other | Admitting: *Deleted

## 2016-01-11 ENCOUNTER — Telehealth: Payer: Self-pay | Admitting: Cardiology

## 2016-01-11 NOTE — Telephone Encounter (Signed)
Spoke with pt and reminded pt of remote transmission that is due today. Pt verbalized understanding.   

## 2016-01-12 ENCOUNTER — Telehealth: Payer: Self-pay | Admitting: *Deleted

## 2016-01-12 ENCOUNTER — Ambulatory Visit (INDEPENDENT_AMBULATORY_CARE_PROVIDER_SITE_OTHER): Payer: Medicare Other | Admitting: Cardiovascular Disease

## 2016-01-12 VITALS — BP 123/71 | HR 74 | Ht 66.0 in | Wt 161.0 lb

## 2016-01-12 DIAGNOSIS — E785 Hyperlipidemia, unspecified: Secondary | ICD-10-CM

## 2016-01-12 DIAGNOSIS — E1140 Type 2 diabetes mellitus with diabetic neuropathy, unspecified: Secondary | ICD-10-CM

## 2016-01-12 DIAGNOSIS — R5383 Other fatigue: Secondary | ICD-10-CM

## 2016-01-12 DIAGNOSIS — R531 Weakness: Secondary | ICD-10-CM

## 2016-01-12 DIAGNOSIS — Z95810 Presence of automatic (implantable) cardiac defibrillator: Secondary | ICD-10-CM | POA: Diagnosis not present

## 2016-01-12 DIAGNOSIS — I5032 Chronic diastolic (congestive) heart failure: Secondary | ICD-10-CM | POA: Diagnosis not present

## 2016-01-12 NOTE — Telephone Encounter (Signed)
Notes Recorded by Laurine Blazer, LPN on [id number] at 624THL PM No answer.  Notes Recorded by Herminio Commons, MD on 01/12/2016 at 3:45 PM Slightly worse renal function likely due to increased diuretic requirement. Hold metolazone and stick to current torsemide regimen.

## 2016-01-12 NOTE — Patient Instructions (Signed)
Medication Instructions:  Continue all current medications.  Labwork:  BMET - order given today.  Office will contact with results via phone or letter.    Testing/Procedures: NONE  Referrals:   [facility]   Follow-Up: Your physician wants you to follow up in: 6 months.  You will receive a reminder letter in the mail one-two months in advance.  If you don't receive a letter, please call our office to schedule the follow up appointment   Any Other Special Instructions Will Be Listed Below (If Applicable).  If you need a refill on your cardiac medications before your next appointment, please call your pharmacy.
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
## 2016-01-14 NOTE — Telephone Encounter (Signed)
Notes Recorded by Laurine Blazer, LPN on [id number] at 624THL AM Patient notified. Copy to pmd.

## 2016-01-14 NOTE — Progress Notes (Signed)
No ICM remote transmission received on scheduled date of 01/11/2016.  Next remote transmission scheduled for 01/28/2016.

## 2016-01-15 ENCOUNTER — Encounter: Payer: Self-pay | Admitting: Cardiology

## 2016-01-28 ENCOUNTER — Telehealth: Payer: Self-pay

## 2016-01-28 NOTE — Telephone Encounter (Signed)
Call to patient and advised the last remote transmission sent was 12/24/2015.  She stated she has tried to send it a few times but must not be working.  She will try to send one today.  Advised will monitor for new transmission and call her back.  Explained if not received today, she will need to call  [facility] support services to assist her.

## 2016-01-28 NOTE — Telephone Encounter (Signed)
Call back to patient and advised no remote transmission was received.  Provided tech services number and advised to call today.  She stated she called Staci at Dr [facility] office today due to she has gained 6 lbs and today's weight 166 lbs and baseline is 160 lbs. Also has increased SOB.  She will take Metolazone as ordered.  Advised if she gets worse over the weekend to go to ER if needed.

## 2016-02-05 ENCOUNTER — Ambulatory Visit (INDEPENDENT_AMBULATORY_CARE_PROVIDER_SITE_OTHER): Payer: Medicare Other

## 2016-02-05 DIAGNOSIS — Z95810 Presence of automatic (implantable) cardiac defibrillator: Secondary | ICD-10-CM

## 2016-02-05 DIAGNOSIS — I5032 Chronic diastolic (congestive) heart failure: Secondary | ICD-10-CM

## 2016-02-05 NOTE — Progress Notes (Addendum)
EPIC Encounter for ICM Monitoring  Patient Name: [name] is a 82 y.o. female Date: 02/05/2016 Primary Care Physican: Deloria Lair, MD Primary Cardiologist: Bronson Ing Electrophysiologist: Allred Dry Weight: 160 lb  Bi-V Pacing:  97.4%       Heart Failure questions reviewed, pt reported she feels weak.  Last week she gained 6 lbs but that has resolved.   Thoracic impedance abnormal suggesting fluid accumulation 01/15/2016 to 01/29/2016.  Recommendations: No changes.  She reported low vitamin D and has a prescription to take twice a week.  She stated physician thinks this may be causing her some let weakness.  ICM trend: 02/05/2016     Follow-up plan: ICM clinic phone appointment on 03/07/2016.  Copy of ICM check sent to primary cardiologist and device physician.   Rosalene Billings, RN 02/05/2016 1:11 PM

## 2016-03-07 ENCOUNTER — Telehealth: Payer: Self-pay | Admitting: Cardiology

## 2016-03-07 NOTE — Telephone Encounter (Signed)
Spoke with pt and reminded pt of remote transmission that is due today. Pt verbalized understanding.   

## 2016-03-11 NOTE — Progress Notes (Signed)
No ICM remote transmission received on 03/07/2016.   Next ICM transmission scheduled for 03/31/2016.

## 2016-03-31 ENCOUNTER — Telehealth: Payer: Self-pay | Admitting: Cardiology

## 2016-03-31 NOTE — Telephone Encounter (Signed)
Spoke with pt and reminded pt of remote transmission that is due today. Pt verbalized understanding.   

## 2016-04-01 NOTE — Progress Notes (Signed)
No ICM remote transmission received for 03/31/2016 and next ICM transmission scheduled for 04/20/2016.

## 2016-04-20 ENCOUNTER — Telehealth: Payer: Self-pay | Admitting: Cardiology

## 2016-04-20 NOTE — Telephone Encounter (Signed)
LMOVM reminding pt to send remote transmission.   

## 2016-04-28 NOTE — Telephone Encounter (Signed)
No ICM remote transmission received on 04/20/2016.   Next ICM transmission scheduled for 05/12/2016.

## 2016-05-12 ENCOUNTER — Telehealth: Payer: Self-pay | Admitting: Cardiology

## 2016-05-12 NOTE — Telephone Encounter (Signed)
Spoke with pt and reminded pt of remote transmission that is due today. Pt verbalized understanding.   

## 2016-05-16 NOTE — Progress Notes (Signed)
No ICM remote transmission received on 05/12/2016.   Next ICM transmission scheduled for 06/02/2016.

## 2016-06-02 ENCOUNTER — Telehealth: Payer: Self-pay | Admitting: Cardiology

## 2016-06-02 NOTE — Telephone Encounter (Signed)
Spoke with pt and reminded pt of remote transmission that is due today. Pt verbalized understanding.   

## 2016-06-06 ENCOUNTER — Telehealth: Payer: Self-pay

## 2016-06-06 NOTE — Telephone Encounter (Signed)
Attempted ICM call and no answer.  No remote transmission received since 01/2016.   No ICM remote transmission received on 06/02/2016.   Next ICM transmission scheduled for 06/16/2016.

## 2016-06-10 ENCOUNTER — Telehealth: Payer: Self-pay

## 2016-06-10 NOTE — Telephone Encounter (Signed)
Received call from patient requesting a new script for diabetic strips.  She said she got a new machine from PCP office and only had 10 strips in it.  Advised her to call PCP office back today to request a new script for her new machine or she can call the pharmacy and they will call.  She stated she would do so.  Asked if her home monitor is working and have not received any transmissions in the last few months.  She stated she didn't think so.  Advised her to call [employer] to get replacement monitor or help her troubleshoot the monitor she has.

## 2016-06-16 ENCOUNTER — Telehealth: Payer: Self-pay | Admitting: Cardiology

## 2016-06-16 NOTE — Telephone Encounter (Signed)
Spoke with pt and reminded pt of remote transmission that is due today. Pt verbalized understanding.   

## 2016-06-17 NOTE — Progress Notes (Signed)
No ICM remote transmission received for 06/16/2016 and next ICM transmission scheduled for 08/02/2016.  Office appointment with Dr Rayann Heman 07/01/2016.  No remote transmissions received since 01/2016.

## 2016-07-01 ENCOUNTER — Ambulatory Visit: Payer: Medicare Other | Admitting: Internal Medicine

## 2016-07-19 ENCOUNTER — Ambulatory Visit (INDEPENDENT_AMBULATORY_CARE_PROVIDER_SITE_OTHER): Payer: Medicare Other | Admitting: Cardiovascular Disease

## 2016-07-19 ENCOUNTER — Encounter: Payer: Self-pay | Admitting: Cardiovascular Disease

## 2016-07-19 VITALS — BP 122/88 | HR 74 | Ht 66.0 in | Wt 164.0 lb

## 2016-07-19 DIAGNOSIS — R531 Weakness: Secondary | ICD-10-CM | POA: Diagnosis not present

## 2016-07-19 DIAGNOSIS — E782 Mixed hyperlipidemia: Secondary | ICD-10-CM | POA: Diagnosis not present

## 2016-07-19 DIAGNOSIS — I2510 Atherosclerotic heart disease of native coronary artery without angina pectoris: Secondary | ICD-10-CM

## 2016-07-19 DIAGNOSIS — Z95810 Presence of automatic (implantable) cardiac defibrillator: Secondary | ICD-10-CM

## 2016-07-19 DIAGNOSIS — I5032 Chronic diastolic (congestive) heart failure: Secondary | ICD-10-CM | POA: Diagnosis not present

## 2016-07-19 MED ORDER — CARVEDILOL 3.125 MG PO TABS: 3.1250 mg | ORAL_TABLET | Freq: Two times a day (BID) | ORAL | 3 refills | Status: DC

## 2016-07-19 NOTE — Patient Instructions (Signed)
Medication Instructions:  Continue all current medications.  Labwork:  BMET, Magnesium - orders given today.   Office will contact with results via phone or letter.    Testing/Procedures: none  Follow-Up: Your physician wants you to follow up in: 6 months.  You will receive a reminder letter in the mail one-two months in advance.  If you don't receive a letter, please call our office to schedule the follow up appointment   Any Other Special Instructions Will Be Listed Below (If Applicable).  If you need a refill on your cardiac medications before your next appointment, please call your pharmacy.

## 2016-07-19 NOTE — Progress Notes (Signed)
SUBJECTIVE: The patient presents for follow-up of chronic diastolic heart failure and coronary artery disease.   Wt  164 lbs   (161 lbs on 01/12/16).  Echocardiogram on 06/05/15 demonstrated that left ventricular systolic function had normalized when compared to July 2014 , LVEF 55%.  Nuclear stress test on 06/10/15 demonstrated extensive prior myocardial scar in the anterior, lateral, and inferior walls, with very minimal peri-infarct ischemia in the anterior wall.  She refuses to go to the advanced heart failure clinic in [place].  She says "I feel bad". She said her legs hurt to the touch. She has a myriad of complaints. She thinks she may have a urinary tract infection. She said vitamin D levels are low but she stopped taking vitamin D over the past 3 weeks. Denies chest pain. Had frequent urination last night.   Review of Systems: As per "subjective", otherwise negative.  Allergies  Allergen Reactions  . Augmentin [Amoxicillin-Pot Clavulanate]     Abdominal pain  . Metformin     Upset stomach  . Metolazone     "made everything hurt" 07/03/15  . Sulfonamide Derivatives     swelling    Current Outpatient Prescriptions  Medication Sig Dispense Refill  . ALPRAZolam (XANAX) 0.5 MG tablet Take 0.5 mg by mouth at bedtime as needed for anxiety.    . Ascorbic Acid (VITAMIN C) 100 MG tablet Take 100 mg by mouth daily.    Marland Kitchen aspirin 81 MG chewable tablet Chew 1 tablet (81 mg total) by mouth daily.    . carvedilol (COREG) 3.125 MG tablet TAKE ONE TABLET BY MOUTH TWICE DAILY WITH A MEAL 180 tablet 3  . citalopram (CELEXA) 20 MG tablet Take 20 mg by mouth daily.    . ergocalciferol (VITAMIN D2) 50000 units capsule Take 50,000 Units by mouth 2 (two) times a week.    Marland Kitchen HYDROmorphone (DILAUDID) 4 MG tablet Take 6 mg by mouth every 6 (six) hours as needed for severe pain.     Marland Kitchen insulin glargine (LANTUS) 100 UNIT/ML injection Inject 40 Units into the skin 2 (two) times daily.     .  metolazone (ZAROXOLYN) 2.5 MG tablet TAKE 1 TAB MONDAYS, WEDNESDAYS, FridayS 30 tablet 1  . Nebulizer MISC Inhale into the lungs.    . nitroGLYCERIN (NITROSTAT) 0.4 MG SL tablet Place 1 tablet (0.4 mg total) under the tongue every 5 (five) minutes as needed for chest pain. 25 tablet 3  . ondansetron (ZOFRAN) 4 MG tablet Take 4 mg by mouth every 8 (eight) hours as needed for nausea or vomiting.    . OXYGEN-HELIUM IN 2 L as needed.    . pantoprazole (PROTONIX) 40 MG tablet Take 40 mg by mouth daily.    . promethazine (PHENERGAN) 25 MG tablet Take 25 mg by mouth every 6 (six) hours as needed for nausea or vomiting.    . ranitidine (ZANTAC) 150 MG tablet Take 150 mg by mouth 2 (two) times daily.     Marland Kitchen torsemide (DEMADEX) 20 MG tablet Take 2 tabs ('40mg'$ ) by mouth every morning & 1 tab ('20mg'$ ) every evening     No current facility-administered medications for this visit.     Past Medical History:  Diagnosis Date  . Aneurysm ([place])    Apical; previously on Coumadin  . Anxiety disorder   . Cancer (HCC)    Skin  . Chronic renal insufficiency    Creatinine 1.17 January 2010  . Coronary artery disease  Cath 02/15/12 revealed RCA as culprit lesion w/ occlusion at site of prior stent, patent LAD stent, chronic LCx and Diagonal dz; Medical therapy   . Depression   . GERD (gastroesophageal reflux disease)   . Hyperlipidemia    H/o GI intolerance to Lipitor  . Impingement syndrome of left shoulder   . Ischemic cardiomyopathy    s/p Medtronic BiV ICD 2006  . Peripheral polyneuropathy (HCC)    Refractory to several medications  . Systolic and diastolic CHF, chronic (HCC)    echo 03/2012 EF 27-78%, mod diastolic dysfunction, mod MR, mod LAE, mod-severe TR, PASP 58mHg  . Type 2 diabetes mellitus ([place]     Past Surgical History:  Procedure Laterality Date  . ABDOMINAL HYSTERECTOMY    . CARDIAC DEFIBRILLATOR PLACEMENT  05/28/2009   Medtronic ICD placed by Dr. KCaryl Comessecondary to ICM/CHF, [id number]  lead fracture with ICD storm 2010 requiring new RV lead placement  . CHOLECYSTECTOMY    . EYE SURGERY    . IMPLANTABLE CARDIOVERTER DEFIBRILLATOR GENERATOR CHANGE  09/26/13   BiV ICD generator change by Dr ARayann Heman- MDT VPershing ProudXT CRT-D  . IMPLANTABLE CARDIOVERTER DEFIBRILLATOR GENERATOR CHANGE N/A 09/26/2013   Procedure: IMPLANTABLE CARDIOVERTER DEFIBRILLATOR GENERATOR CHANGE;  Surgeon: JCoralyn Mark MD;  [facility];  Service: Cardiovascular;  Laterality: N/A;  . LEFT HEART CATHETERIZATION WITH CORONARY ANGIOGRAM N/A 02/15/2012   Procedure: LEFT HEART CATHETERIZATION WITH CORONARY ANGIOGRAM;  Surgeon: Peter M JMartinique MD;  [facility];  Service: Cardiovascular;  Laterality: N/A;  . RIGHT HEART CATHETERIZATION N/A 04/27/2012   Procedure: RIGHT HEART CATH;  Surgeon: DLarey Dresser MD;  [facility];  Service: Cardiovascular;  Laterality: N/A;    Social History   Social History  . Marital status: Widowed    Spouse name: N/A  . Number of children: N/A  . Years of education: N/A   Occupational History  . Retired Retired   Social History Main Topics  . Smoking status: Former Smoker    Packs/day: 0.80    Years: 30.00    Types: Cigarettes    Start date: 04/05/1957    Quit date: 06/27/2002  . Smokeless tobacco: Never Used  . Alcohol use No  . Drug use: No  . Sexual activity: Not Currently   Other Topics Concern  . Not on file   Social History Narrative  . No narrative on file     Vitals:   07/19/16 1256  BP: 122/88  Pulse: 74  SpO2: 94%  Weight: 164 lb (74.4 kg)  Height: '5\' 6"'$  (1.676 m)    PHYSICAL EXAM General: NAD HEENT: Normal. Neck: No JVD, no thyromegaly. Lungs: No rales, faint end-expiratory wheezes CV: Nondisplaced PMI.  Regular rate and rhythm, normal S1/S2, no S3/S4, no murmur. No pretibial or periankle edema.     Abdomen: Soft, nontender, no distention.  Neurologic: Alert and oriented.  Psych: Normal affect. Skin:  Normal. Musculoskeletal: No gross deformities.    ECG: Most recent ECG reviewed.      ASSESSMENT AND PLAN: 1. CAD with ischemic cardiomyopathy: Stable ischemic heart disease with LVEF normalization. Continue ASA and Coreg.   2. Chronic diastolic heart failure: Continue torsemide 40 mg q am and 20 mg q pm. Encouraged her to take metolazone 2.5 mg every 2.5 mg every Mon, Wed, and Friday to remain euvolemic.  I previously spoke to her at length about not eating fast food. I already made a referral to a nutritionist.  I have recommended she go to the advanced heart failure clinic in [place]. Will check BMET and Mg.  3. Hyperlipidemia: Has not wanted to try alternative statin therapy.  4. CRT-D: Functioning normally. Followed by Dr. Rayann Heman.  5. Generalized weakness: Will check BMET and Mg. Encouraged to take Vit D as prescribed.  Dispo: f/u 6 months  Kate Sable, M.D., F.A.C.C.

## 2016-07-20 ENCOUNTER — Telehealth: Payer: Self-pay | Admitting: *Deleted

## 2016-07-20 DIAGNOSIS — I5022 Chronic systolic (congestive) heart failure: Secondary | ICD-10-CM

## 2016-07-20 DIAGNOSIS — I5023 Acute on chronic systolic (congestive) heart failure: Secondary | ICD-10-CM

## 2016-07-20 MED ORDER — TORSEMIDE 20 MG PO TABS: 20.0000 mg | ORAL_TABLET | Freq: Two times a day (BID) | ORAL | Status: DC

## 2016-07-20 MED ORDER — METOLAZONE 2.5 MG PO TABS: ORAL_TABLET | ORAL | Status: DC

## 2016-07-20 NOTE — Telephone Encounter (Signed)
Notes Recorded by Laurine Blazer, LPN on 9/92/4268 at 3:41 AM EST Patient notified and verbalized understanding. Will mail lab order.  Copy to pmd.   ------  Notes Recorded by Herminio Commons, MD on 07/20/2016 at 8:43 AM EST Reduce torsemide to 20 mg bid. Reduce metolazone to only on Mondays and Fridays. Keep track of daily weights. Repeat BMET in 2 weeks.

## 2016-07-21 ENCOUNTER — Telehealth: Payer: Self-pay

## 2016-07-21 NOTE — Telephone Encounter (Signed)
ICM call to patient after receiving voice mail from Lynd saying patient wanted a call regarding home monitor.  She stated she received new [employer] Monitor and tried sending report earlier this week.  She thought it went through but took a long time for it to cut off.  No remote transmission received.  She said she would try again tomorrow and asked me to call her back to let her know if I received it.

## 2016-07-22 ENCOUNTER — Ambulatory Visit (INDEPENDENT_AMBULATORY_CARE_PROVIDER_SITE_OTHER): Payer: Medicare Other

## 2016-07-22 ENCOUNTER — Ambulatory Visit (INDEPENDENT_AMBULATORY_CARE_PROVIDER_SITE_OTHER): Payer: Medicare Other | Admitting: *Deleted

## 2016-07-22 DIAGNOSIS — I5022 Chronic systolic (congestive) heart failure: Secondary | ICD-10-CM

## 2016-07-22 DIAGNOSIS — Z95810 Presence of automatic (implantable) cardiac defibrillator: Secondary | ICD-10-CM | POA: Diagnosis not present

## 2016-07-22 DIAGNOSIS — I255 Ischemic cardiomyopathy: Secondary | ICD-10-CM | POA: Diagnosis not present

## 2016-07-22 NOTE — Progress Notes (Signed)
EPIC Encounter for ICM Monitoring  Patient Name: [name] is a 83 y.o. female Date: 07/22/2016 Primary Care Physican: Deloria Lair, MD Primary Cardiologist: Bronson Ing Electrophysiologist: Allred Dry Weight: 161 lbs  Bi-V Pacing:  97.2%       Heart Failure questions reviewed, pt reported she is always tired.  She has a sore on her foot that is being treated  Thoracic impedance abnormal suggesting fluid accumulation.  She has missed a couple of doses of the Torsemide.  Recommendations:   She was unsure if she should take Metolazone and advised to take as Dr Bronson Ing has prescribed which would be today and Monday.  Follow-up plan: ICM clinic phone appointment on 08/02/2016 to recheck fluid levels.  Copy of ICM check sent to primary cardiologist and device physician.   3 month ICM trend: 07/22/2016   1 Year ICM trend:      Rosalene Billings, RN 07/22/2016 1:48 PM

## 2016-07-22 NOTE — Telephone Encounter (Signed)
Spoke with patient.. See ICM note 

## 2016-07-25 NOTE — Progress Notes (Signed)
Remote ICD transmission.   

## 2016-07-26 ENCOUNTER — Encounter: Payer: Self-pay | Admitting: Cardiology

## 2016-07-26 LAB — CUP PACEART REMOTE DEVICE CHECK
Battery Voltage: 2.94 V
Brady Statistic AS VP Percent: 95.26 %
Brady Statistic RA Percent Paced: 3.2 %
Brady Statistic RV Percent Paced: 97.24 %
HighPow Impedance: 55 Ohm
HighPow Impedance: 69 Ohm
Implantable Lead Implant Date: 20060424
Implantable Lead Implant Date: 20060424
Implantable Lead Location: 753859
Implantable Lead Location: 753860
Implantable Lead Model: 4194
Implantable Lead Model: 7120
Implantable Pulse Generator Implant Date: 20150402
Lead Channel Impedance Value: 4047 Ohm
Lead Channel Impedance Value: 437 Ohm
Lead Channel Impedance Value: 456 Ohm
Lead Channel Impedance Value: 456 Ohm
Lead Channel Pacing Threshold Pulse Width: 0.4 ms
Lead Channel Pacing Threshold Pulse Width: 0.4 ms
Lead Channel Pacing Threshold Pulse Width: 1 ms
Lead Channel Sensing Intrinsic Amplitude: 0.5 mV
Lead Channel Sensing Intrinsic Amplitude: 0.5 mV
Lead Channel Sensing Intrinsic Amplitude: 12.75 mV
Lead Channel Setting Pacing Amplitude: 2 V
Lead Channel Setting Pacing Pulse Width: 0.4 ms
Lead Channel Setting Pacing Pulse Width: 1 ms
Lead Channel Setting Sensing Sensitivity: 0.3 mV
MDC IDC LEAD IMPLANT DT: 20101202
MDC IDC LEAD LOCATION: 753858
MDC IDC MSMT BATTERY REMAINING LONGEVITY: 30 mo
MDC IDC MSMT LEADCHNL LV IMPEDANCE VALUE: 4047 Ohm
MDC IDC MSMT LEADCHNL LV PACING THRESHOLD AMPLITUDE: 2.25 V
MDC IDC MSMT LEADCHNL RA PACING THRESHOLD AMPLITUDE: 1 V
MDC IDC MSMT LEADCHNL RV IMPEDANCE VALUE: 323 Ohm
MDC IDC MSMT LEADCHNL RV PACING THRESHOLD AMPLITUDE: 1.25 V
MDC IDC MSMT LEADCHNL RV SENSING INTR AMPL: 12.75 mV
MDC IDC SESS DTM: 20180126143919
MDC IDC SET LEADCHNL LV PACING AMPLITUDE: 3.25 V
MDC IDC SET LEADCHNL RV PACING AMPLITUDE: 2.5 V
MDC IDC STAT BRADY AP VP PERCENT: 3.17 %
MDC IDC STAT BRADY AP VS PERCENT: 0.05 %
MDC IDC STAT BRADY AS VS PERCENT: 1.52 %

## 2016-08-02 ENCOUNTER — Ambulatory Visit (INDEPENDENT_AMBULATORY_CARE_PROVIDER_SITE_OTHER): Payer: Medicare Other

## 2016-08-02 DIAGNOSIS — Z95810 Presence of automatic (implantable) cardiac defibrillator: Secondary | ICD-10-CM

## 2016-08-02 DIAGNOSIS — I5032 Chronic diastolic (congestive) heart failure: Secondary | ICD-10-CM

## 2016-08-04 NOTE — Progress Notes (Signed)
EPIC Encounter for ICM Monitoring  Patient Name: [name] is a 83 y.o. female Date: 08/04/2016 Primary Care Physican: Deloria Lair, MD Primary Cardiologist: Bronson Ing Electrophysiologist: Allred Baseline Weight:  161 lbs  Current Weight:  161 lbs Bi-V Pacing:  94.9%       Heart Failure questions reviewed, pt asymptomatic   Thoracic impedance abnormal suggesting fluid accumulation since 07/28/2016.  Labs: Labs drawn on 08/03/2016 07/19/2016 Creatinine 1.24, BUN 42, Potassium 3.9, Sodium 135, EGFR 42-50 01/12/2016 Creatinine 1.44, BUN 36, Potassium 3.9, Sodium 136, EGFR 35-42  09/21/2015 Creatinine 1.18, BUN 22, Potassium 4.6, Sodium 135, EGFR 44-53  09/07/2015 Creatinine 1.25, BUN 29, Potassium 4.6, Sodium 134, EGFR 41-50  07/31/2015 Creatinine 1.07, BUN 18, Potassium 4.5, Sodium 140, EGFR 49-60  08/12/2015 Creatinine 1.16, BUN 17, Potassium 4.7, Sodium 138, EGFR 45-55  07/03/2015 Creatinine 1.26, BUN 31, Potassium 5.2, Sodium 134, EGFR 41-50  Recommendations:  Patient declined to take prescribed Metolazone because she is so weak today.  Advised to take Metolazone if she feels stronger in the next few days. She confirmed she is taking Torsemide 20 mg bid.   Follow-up plan: ICM clinic phone appointment on 08/12/2016 to recheck fluid levels.  She had labs drawn yesterday as ordered by Dr Bronson Ing.  Copy of ICM check sent to primary cardiologist and device physician.   3 month ICM trend: 08/03/2016     1 Year ICM trend:       Rosalene Billings, RN 08/04/2016 10:59 AM

## 2016-08-09 ENCOUNTER — Telehealth: Payer: Self-pay | Admitting: Cardiovascular Disease

## 2016-08-09 ENCOUNTER — Encounter: Payer: Self-pay | Admitting: *Deleted

## 2016-08-09 NOTE — Telephone Encounter (Signed)
Pt aware that we will request lab results from [facility] and once Dr Bronson Ing results on those we would call her back. Lab results requested

## 2016-08-09 NOTE — Telephone Encounter (Signed)
Patient would like to know lab results.

## 2016-08-10 NOTE — Telephone Encounter (Signed)
Pt says she hasn't taken metolazone but once this week and says "it made me go to bed and wipes me out" still taking metolazone bid though admits having missed a few doses - has not taken any today says she hasn't been feeling well. Concerned that torsemide or metolazone is causing burning sensation when she urinates - and c/o weakness

## 2016-08-10 NOTE — Telephone Encounter (Signed)
Stable CKD overall.

## 2016-08-10 NOTE — Telephone Encounter (Signed)
Received labs scanned into chart

## 2016-08-11 NOTE — Telephone Encounter (Signed)
Pt declined referral to HF clinic - says grandson working full time now and doesn't have transportation to [place] - pt says she would attempt to find someone to take her - encouraged her to do so and call us back and we would place referral pt agreeable

## 2016-08-11 NOTE — Telephone Encounter (Signed)
I previously recommended she be evaluated in advanced HF clinic. I'd still like for her to do so. Her nephew or grandson said he'd be willing to take her to [place].

## 2016-08-12 ENCOUNTER — Ambulatory Visit (INDEPENDENT_AMBULATORY_CARE_PROVIDER_SITE_OTHER): Payer: Medicare Other

## 2016-08-12 DIAGNOSIS — I5022 Chronic systolic (congestive) heart failure: Secondary | ICD-10-CM

## 2016-08-12 DIAGNOSIS — I5032 Chronic diastolic (congestive) heart failure: Secondary | ICD-10-CM

## 2016-08-12 DIAGNOSIS — Z95810 Presence of automatic (implantable) cardiac defibrillator: Secondary | ICD-10-CM

## 2016-08-12 NOTE — Progress Notes (Signed)
EPIC Encounter for ICM Monitoring  Patient Name: [name] is a 83 y.o. female Date: 08/12/2016 Primary Care Physican: Deloria Lair, MD Primary Cardiologist: Bronson Ing Electrophysiologist: Allred Baseline Weight:  161 lbs  Current Weight:  163 lbs Bi-V Pacing:  95.1%                                                    Heart Failure questions reviewed, pt has weight gain of 2 lbs.    Thoracic impedance continues to be abnormal suggesting fluid accumulation since 07/28/2016.  Labs: Labs drawn on 08/03/2016 08/02/2016 Creatinine 1.32, BUN 44, Potassium 3.8, Sodium 135, EGFR 39-47 07/19/2016 Creatinine 1.24, BUN 42, Potassium 3.9, Sodium 135, EGFR 42-50 01/12/2016 Creatinine 1.44, BUN 36, Potassium 3.9, Sodium 136, EGFR 35-42  09/21/2015 Creatinine 1.18, BUN 22, Potassium 4.6, Sodium 135, EGFR 44-53  09/07/2015 Creatinine 1.25, BUN 29, Potassium 4.6, Sodium 134, EGFR 41-50  07/31/2015 Creatinine 1.07, BUN 18, Potassium 4.5, Sodium 140, EGFR 49-60  08/12/2015 Creatinine 1.16, BUN 17, Potassium 4.7, Sodium 138, EGFR 45-55  07/03/2015 Creatinine 1.26, BUN 31, Potassium 5.2, Sodium 134, EGFR 41-50  Current prescribed dose of Torsemide 20 mg 1 tablet twice a day and Metolazone 2.5 mg on Monday and Friday  Recommendations:  Patient did not take both doses of Torsemide today and is not taking Metolazone as prescribed.  Advised to take Metolazone as prescribed for fluid retention.  She complains of weakness when she takes any fluid pills.  She stated if she feels stronger she will try to take one as prescribed.  Advised her risk of hospitalization increases when she is not taking the diuretics. She says she stays in bed a lot and hard to take care of herself.  Encouraged her to talk to her family about some assistance or facility where she could get some help and she refuses. She still refuses a referral to HF clinic because she is unable to get a ride.  Advised if her fluid symptoms  become urgent over the weekend to use 911.    Follow-up plan: ICM clinic phone appointment on 08/17/2016 to recheck fluid levels- manual send.  Copy of ICM check sent to primary cardiologist and device physician.   3 month ICM trend: 08/12/2016   1 Year ICM trend:      Rosalene Billings, RN 08/12/2016 11:00 AM

## 2016-08-17 ENCOUNTER — Ambulatory Visit (INDEPENDENT_AMBULATORY_CARE_PROVIDER_SITE_OTHER): Payer: Medicare Other

## 2016-08-17 DIAGNOSIS — I5032 Chronic diastolic (congestive) heart failure: Secondary | ICD-10-CM

## 2016-08-17 DIAGNOSIS — Z95810 Presence of automatic (implantable) cardiac defibrillator: Secondary | ICD-10-CM

## 2016-08-18 NOTE — Progress Notes (Signed)
EPIC Encounter for ICM Monitoring  Patient Name: [name] is a 83 y.o. female Date: 08/18/2016 Primary Care Physican: Deloria Lair, MD Primary Cardiologist: Bronson Ing Electrophysiologist: Allred Baseline Weight: 161lbs  Current Weight: 159 lbs Bi-V Pacing: 95.1%  Heart Failure questions reviewed, pt reported increase in shortness of breath earlier at the beginning of this week and she called [place] at [facility] but she never called back.  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  Advised when she has fluid symptoms she should call either myself or Dr Court Joy office. Advised she has the option of taking Metolazone as prescribed for fluid symptoms and she did take a Metolazone yesterday.  She said her breathing has improved and she is feeling better.      Thoracic impedance returned to baseline normal since taking Metolazone 08/17/2016.   Prescribed Torsemide 20 mg 1 tablet bid and Metolazone 2.5 mg 1 tablet on Monday and Friday.    Labs:  08/02/2016 Creatinine 1.32, BUN 44, Potassium 3.8, Sodium 135, EGFR 39-47 07/19/2016 Creatinine 1.24, BUN 42, Potassium 3.9, Sodium 135, EGFR 42-50 01/12/2016 Creatinine 1.44, BUN 36, Potassium 3.9, Sodium 136, EGFR 35-42  09/21/2015 Creatinine 1.18, BUN 22, Potassium 4.6, Sodium 135, EGFR 44-53  09/07/2015 Creatinine 1.25, BUN 29, Potassium 4.6, Sodium 134, EGFR 41-50  07/31/2015 Creatinine 1.07, BUN 18, Potassium 4.5, Sodium 140, EGFR 49-60  08/12/2015 Creatinine 1.16, BUN 17, Potassium 4.7, Sodium 138, EGFR 45-55  07/03/2015 Creatinine 1.26, BUN 31, Potassium 5.2, Sodium 134, EGFR 41-50  Recommendations: No changes. Reminded to limit dietary salt intake to 2000 mg/day and fluid intake to < 2 liters/day. Encouraged to call for fluid symptoms.  Follow-up plan: ICM clinic phone appointment on [id number] and defib office check  with Dr Rayann Heman on 09/02/2016.    Copy of ICM check sent to primary cardiologist and device physician.   3 month ICM trend: 08/18/2016   1 Year ICM trend:      Rosalene Billings, RN 08/18/2016 9:18 AM

## 2016-08-25 ENCOUNTER — Ambulatory Visit (INDEPENDENT_AMBULATORY_CARE_PROVIDER_SITE_OTHER): Payer: Medicare Other

## 2016-08-25 DIAGNOSIS — I5032 Chronic diastolic (congestive) heart failure: Secondary | ICD-10-CM

## 2016-08-25 DIAGNOSIS — Z95810 Presence of automatic (implantable) cardiac defibrillator: Secondary | ICD-10-CM

## 2016-08-25 NOTE — Progress Notes (Signed)
EPIC Encounter for ICM Monitoring  Patient Name: [name] is a 83 y.o. female Date: 08/25/2016 Primary Care Physican: Shirley Lair, MD Primary Cardiologist: [name] Electrophysiologist: [name] Dry Weight: 159 lbs  Bi-V Pacing: 94.5%      Received call from patient due to she was concerned about having fluid because she has not taken any fluid pills for 2 days because she thought it was causing her generalized weakness.  Heart Failure questions reviewed, pt asymptomatic for fluid symptoms but continues to have tiredness and weakness.  She stated she has not talked to Dr [name] about the weakness and advised to call to make an office appointment.     Thoracic impedance normal after taking Metolozone on 08/17/16.  Prescribed Torsemide 20 mg 1 tablet bid and Metolazone 2.5 mg 1 tablet on Monday and Friday.    Labs:  08/02/2016 Creatinine 1.32, BUN 44, Potassium 3.8, Sodium 135, EGFR 39-47 07/19/2016 Creatinine 1.24, BUN 42, Potassium 3.9, Sodium 135, EGFR 42-50 01/12/2016 Creatinine 1.44, BUN 36, Potassium 3.9, Sodium 136, EGFR 35-42  09/21/2015 Creatinine 1.18, BUN 22, Potassium 4.6, Sodium 135, EGFR 44-53  09/07/2015 Creatinine 1.25, BUN 29, Potassium 4.6, Sodium 134, EGFR 41-50  07/31/2015 Creatinine 1.07, BUN 18, Potassium 4.5, Sodium 140, EGFR 49-60  08/12/2015 Creatinine 1.16, BUN 17, Potassium 4.7, Sodium 138, EGFR 45-55  07/03/2015 Creatinine 1.26, BUN 31, Potassium 5.2, Sodium 134, EGFR 41-50  Recommendations:   Encouraged patient to make an appointment with HF clinic that was recommended by Dr [name] and she declined.  No changes. Reminded to limit dietary salt intake to 2000 mg/day and fluid intake to < 2 liters/day. Encouraged to call for fluid symptoms.  Follow-up plan: ICM clinic phone appointment on 09/16/2016.  Defib office check with Dr [name] on 09/02/2016.  Copy of ICM check sent to primary cardiologist and device physician.   3 month ICM trend:  08/25/2016   1 Year ICM trend:      Shirley Billings, RN 08/25/2016 10:05 AM

## 2016-09-01 ENCOUNTER — Telehealth: Payer: Self-pay | Admitting: *Deleted

## 2016-09-01 NOTE — Telephone Encounter (Signed)
Caryl Pina, RN from [facility] home health says pt gained 5lbs in 7 days - called to f/u with pt - pt says she took metolazone 2.5 mg for 3 days - pt says this didn't help - also takes torsemide 20 mg bid. Pt had appt with Dr. Scotty Court today who ordered lab work and pt says they will be faxing results to our office. Pt has appt with Dr Rayann Heman 3/8 (tomorrow). Pt was currently at Dr. Electa Sniff office having labs drawn.

## 2016-09-02 ENCOUNTER — Encounter: Payer: Self-pay | Admitting: Internal Medicine

## 2016-09-02 ENCOUNTER — Ambulatory Visit (INDEPENDENT_AMBULATORY_CARE_PROVIDER_SITE_OTHER): Payer: Medicare Other | Admitting: Internal Medicine

## 2016-09-02 VITALS — BP 137/71 | HR 70 | Ht 68.0 in | Wt 165.0 lb

## 2016-09-02 DIAGNOSIS — Z95810 Presence of automatic (implantable) cardiac defibrillator: Secondary | ICD-10-CM

## 2016-09-02 DIAGNOSIS — I5022 Chronic systolic (congestive) heart failure: Secondary | ICD-10-CM | POA: Diagnosis not present

## 2016-09-02 DIAGNOSIS — R0602 Shortness of breath: Secondary | ICD-10-CM

## 2016-09-02 DIAGNOSIS — I255 Ischemic cardiomyopathy: Secondary | ICD-10-CM

## 2016-09-02 MED ORDER — TORSEMIDE 20 MG PO TABS: 40.0000 mg | ORAL_TABLET | Freq: Two times a day (BID) | ORAL | 6 refills | Status: DC

## 2016-09-02 NOTE — Progress Notes (Signed)
PCP:Deloria Lair, MD Primary Cardiologist:  Dr Harl Bowie  The patient presents today for routine electrophysiology followup.  She is having failure to thrive.  She has frequent difficulty with volume overload and requires additional torsemide or zaroxolyn frequently.  She is wearing oxygen most of the time.  I have reviewed labs from Dr Rayna Sexton office today.  Today, she denies symptoms of palpitations, chest pain,  lower extremity edema, dizziness, presyncope, syncope, or neurologic sequela.  The patient feels that she is tolerating medications without difficulties and is otherwise without complaint today.    Past Medical History:  Diagnosis Date  . Aneurysm ([place])    Apical; previously on Coumadin  . Anxiety disorder   . Cancer (HCC)    Skin  . Chronic renal insufficiency    Creatinine 1.17 January 2010  . Coronary artery disease    Cath 02/15/12 revealed RCA as culprit lesion w/ occlusion at site of prior stent, patent LAD stent, chronic LCx and Diagonal dz; Medical therapy   . Depression   . GERD (gastroesophageal reflux disease)   . Hyperlipidemia    H/o GI intolerance to Lipitor  . Impingement syndrome of left shoulder   . Ischemic cardiomyopathy    s/p Medtronic BiV ICD 2006  . Peripheral polyneuropathy (HCC)    Refractory to several medications  . Systolic and diastolic CHF, chronic (HCC)    echo 03/2012 EF 03-54%, mod diastolic dysfunction, mod MR, mod LAE, mod-severe TR, PASP 46mmHg  . Type 2 diabetes mellitus ([place])    Past Surgical History:  Procedure Laterality Date  . ABDOMINAL HYSTERECTOMY    . CARDIAC DEFIBRILLATOR PLACEMENT  05/28/2009   Medtronic ICD placed by Dr. Caryl Comes secondary to ICM/CHF, [phone] Fidelis lead fracture with ICD storm 2010 requiring new RV lead placement  . CHOLECYSTECTOMY    . EYE SURGERY    . IMPLANTABLE CARDIOVERTER DEFIBRILLATOR GENERATOR CHANGE  09/26/13   BiV ICD generator change by Dr Rayann Heman - MDT [place] Proud XT CRT-D  . IMPLANTABLE CARDIOVERTER  DEFIBRILLATOR GENERATOR CHANGE N/A 09/26/2013   Procedure: IMPLANTABLE CARDIOVERTER DEFIBRILLATOR GENERATOR CHANGE;  Surgeon: Coralyn Mark, MD;  Location: [facility] C[facility] LAB;  Service: Cardiovascular;  Laterality: N/A;  . LEFT HEART CATHETERIZATION WITH CORONARY ANGIOGRAM N/A 02/15/2012   Procedure: LEFT HEART CATHETERIZATION WITH CORONARY ANGIOGRAM;  Surgeon: Peter M Martinique, MD;  Location: [facility] CATH LAB;  Service: Cardiovascular;  Laterality: N/A;  . RIGHT HEART CATHETERIZATION N/A 04/27/2012   Procedure: RIGHT HEART CATH;  Surgeon: Larey Dresser, MD;  Location: [facility] CATH LAB;  Service: Cardiovascular;  Laterality: N/A;    Current Outpatient Prescriptions  Medication Sig Dispense Refill  . ALPRAZolam (XANAX) 0.5 MG tablet Take 0.5 mg by mouth at bedtime as needed for anxiety.    . Ascorbic Acid (VITAMIN C) 100 MG tablet Take 100 mg by mouth daily.    Marland Kitchen aspirin 81 MG chewable tablet Chew 1 tablet (81 mg total) by mouth daily.    . carvedilol (COREG) 3.125 MG tablet Take 1 tablet (3.125 mg total) by mouth 2 (two) times daily with a meal. 180 tablet 3  . citalopram (CELEXA) 20 MG tablet Take 20 mg by mouth daily.    . ergocalciferol (VITAMIN D2) 50000 units capsule Take 50,000 Units by mouth 2 (two) times a week.    Marland Kitchen HYDROmorphone (DILAUDID) 4 MG tablet Take 6 mg by mouth every 6 (six) hours as needed for severe pain.     Marland Kitchen insulin glargine (LANTUS) 100 UNIT/ML injection  Inject 40 Units into the skin 2 (two) times daily.     . metolazone (ZAROXOLYN) 2.5 MG tablet Take one tab by mouth on Monday & Friday (Patient taking differently: Take one tab by mouth on Monday, Wednesday & Friday)    . Nebulizer MISC Inhale into the lungs.    . nitroGLYCERIN (NITROSTAT) 0.4 MG SL tablet Place 1 tablet (0.4 mg total) under the tongue every 5 (five) minutes as needed for chest pain. 25 tablet 3  . ondansetron (ZOFRAN) 4 MG tablet Take 4 mg by mouth every 8 (eight) hours as needed for nausea or vomiting.    . OXYGEN-HELIUM  IN 2 L as needed.    . promethazine (PHENERGAN) 25 MG tablet Take 25 mg by mouth every 6 (six) hours as needed for nausea or vomiting.    . ranitidine (ZANTAC) 150 MG tablet Take 150 mg by mouth 2 (two) times daily.     Marland Kitchen torsemide (DEMADEX) 20 MG tablet Take 1 tablet (20 mg total) by mouth 2 (two) times daily.     No current facility-administered medications for this visit.     Allergies  Allergen Reactions  . Augmentin [Amoxicillin-Pot Clavulanate]     Abdominal pain  . Metformin     Upset stomach  . Metolazone     "made everything hurt" 07/03/15  . Sulfonamide Derivatives     swelling    Social History   Social History  . Marital status: Widowed    Spouse name: N/A  . Number of children: N/A  . Years of education: N/A   Occupational History  . Retired Retired   Social History Main Topics  . Smoking status: Former Smoker    Packs/day: 0.80    Years: 30.00    Types: Cigarettes    Start date: 04/05/1957    Quit date: 06/27/2002  . Smokeless tobacco: Never Used  . Alcohol use No  . Drug use: No  . Sexual activity: Not Currently   Other Topics Concern  . Not on file   Social History Narrative  . No narrative on file    Family History  Problem Relation Age of Onset  . Heart attack Father   . Heart disease Sister   . Heart disease Brother   . Coronary artery disease Neg Hx    Physical Exam: Vitals:   09/02/16 1201  BP: 137/71  Pulse: 70  SpO2: 97%  Weight: 165 lb (74.8 kg)  Height: 5\' 8"  (1.727 m)    GEN- The patient is elderly and ill appearing, alert and oriented x 3 today.   Head- normocephalic, atraumatic Eyes-  Sclera clear, conjunctiva pink Ears- hearing intact Oropharynx- clear Neck- supple,   Lungs- bibasilar rales 1/3 way up, wearing O2 Chest- ICD pocket is well healed Heart- Regular rate and rhythm, decreased heart sounds GI- soft, NT, ND, + BS Extremities- no clubbing, cyanosis, +edema Neuro- strength and sensation are intact  ICD  interrogation- reviewed in detail today,  See PACEART report  Assessment and Plan:   1. Chronic systolic dysfunction/ ischemic CM She is followed by Sharman Cheek in our ICM device clinic.  She has struggled with progressive CHF recently She is volume overloaded today.  I have reviewed labs from Dr Scotty Court.  I will increase torsemide to 40mg  BID today Normal BiV function See Pace Art report No changes today I will obtain an echo Refer to CRT responder clinic for optimization.  If she cannot be optimized then I think  that she would benefit from referral to advanced heart failure NP.   She is reluctant to travel to [place] however I have been clear that I think that this is currently necessary.  2 gram sodium diet  2. Cad Stable No change required today  Very ill patient.  She is at risk for decompensation/ hospitalization.  A high level of decision making was required for this visit.  carelink Return to see me in 1 year EP APP to see in 4 weeks.  Thompson Grayer MD, [facility] 09/02/2016 12:22 PM

## 2016-09-02 NOTE — Patient Instructions (Signed)
Medication Instructions:   Increase Torsemide to 40mg  twice a day   Continue all other medications.    Labwork: noine  Testing/Procedures:  Your physician has requested that you have an echocardiogram. Echocardiography is a painless test that uses sound waves to create images of your heart. It provides your doctor with information about the size and shape of your heart and how well your heart's chambers and valves are working. This procedure takes approximately one hour. There are no restrictions for this procedure.  Office will contact with results via phone or letter.    Follow-Up: Shirley Marshall, NP - 4 weeks   Any Other Special Instructions Will Be Listed Below (If Applicable). 2 gm sodium diet  If you need a refill on your cardiac medications before your next appointment, please call your pharmacy.

## 2016-09-05 NOTE — Progress Notes (Signed)
ICM remote transmission rescheduled from 09/16/2016 to 09/20/2016.

## 2016-09-06 LAB — CUP PACEART INCLINIC DEVICE CHECK
Brady Statistic AP VP Percent: 3.1 %
Brady Statistic AS VS Percent: 1.5 %
Date Time Interrogation Session: 20180313113458
Implantable Lead Implant Date: 20060424
Implantable Lead Implant Date: 20101202
Implantable Lead Location: 753858
Implantable Lead Location: 753860
Implantable Lead Model: 4194
Implantable Lead Model: 5076
Implantable Pulse Generator Implant Date: 20150402
Lead Channel Impedance Value: 494 Ohm
Lead Channel Impedance Value: 494 Ohm
Lead Channel Pacing Threshold Amplitude: 1.25 V
Lead Channel Pacing Threshold Amplitude: 1.5 V
Lead Channel Pacing Threshold Pulse Width: 0.4 ms
Lead Channel Pacing Threshold Pulse Width: 0.4 ms
Lead Channel Setting Pacing Amplitude: 2 V
Lead Channel Setting Pacing Amplitude: 2.5 V
Lead Channel Setting Pacing Amplitude: 2.75 V
Lead Channel Setting Pacing Pulse Width: 1 ms
Lead Channel Setting Sensing Sensitivity: 0.3 mV
MDC IDC LEAD IMPLANT DT: 20060424
MDC IDC LEAD LOCATION: 753859
MDC IDC MSMT BATTERY REMAINING LONGEVITY: 26 mo
MDC IDC MSMT LEADCHNL LV PACING THRESHOLD PULSEWIDTH: 1 ms
MDC IDC MSMT LEADCHNL RA PACING THRESHOLD AMPLITUDE: 1 V
MDC IDC MSMT LEADCHNL RA SENSING INTR AMPL: 1 mV
MDC IDC MSMT LEADCHNL RV IMPEDANCE VALUE: 494 Ohm
MDC IDC MSMT LEADCHNL RV SENSING INTR AMPL: 13.6 mV
MDC IDC SET LEADCHNL RV PACING PULSEWIDTH: 0.4 ms
MDC IDC STAT BRADY AP VS PERCENT: 0.1 % — AB
MDC IDC STAT BRADY AS VP PERCENT: 95.3 %

## 2016-09-08 ENCOUNTER — Other Ambulatory Visit: Payer: Self-pay | Admitting: *Deleted

## 2016-09-08 DIAGNOSIS — I5043 Acute on chronic combined systolic (congestive) and diastolic (congestive) heart failure: Secondary | ICD-10-CM

## 2016-09-08 DIAGNOSIS — R0602 Shortness of breath: Secondary | ICD-10-CM

## 2016-09-09 ENCOUNTER — Ambulatory Visit (INDEPENDENT_AMBULATORY_CARE_PROVIDER_SITE_OTHER): Payer: Medicare Other

## 2016-09-09 DIAGNOSIS — Z95810 Presence of automatic (implantable) cardiac defibrillator: Secondary | ICD-10-CM

## 2016-09-09 DIAGNOSIS — I5022 Chronic systolic (congestive) heart failure: Secondary | ICD-10-CM

## 2016-09-09 NOTE — Progress Notes (Signed)
EPIC Encounter for ICM Monitoring  Patient Name: [name] is a 83 y.o. female Date: 09/09/2016 Primary Care Physican: Deloria Lair, MD Primary Cardiologist: Bronson Ing Electrophysiologist: Allred Dry Weight: 159 lbs  Bi-V Pacing: 94.7%         Heart Failure questions reviewed, pt asymptomatic.  Patient called stating she continues to have difficulty with taking the diuretics because they make her so weak that she stays in the bed.  This has been her complaint in the past and not a new symptoms.  She adjusts the diuretics depending on how she feels.  Patient calls routinely to report same symptoms and same concerns regarding her diuretics.  Advised she should take medications as prescribed and reminded her of the importance of taking them.  Weight fluctuates by 2 lbs.  She stated Dr Scotty Court told her not to worry about the weight if it is fluctuating within 2 lbs and feels like she worries too much about her weight.    Thoracic impedance normal for the last few days but had been abnormal suggesting fluid accumulation for the majority of the last month.   Prescribed and confirmed dosage: Torsemide 20 mg 2 tablets (40 mg total) twice a day.  Metolazone 2.5 mg one tab by mouth on Monday & Friday.  Labs:  09/01/2016 Creatinine 1.27, BUN 33, Potassium 4.5, Sodium 134, EGFR 40-49 08/02/2016 Creatinine 1.32, BUN 44, Potassium 3.8, Sodium 135, EGFR 39-47 07/19/2016 Creatinine 1.24, BUN 42, Potassium 3.9, Sodium 135, EGFR 42-50 01/12/2016 Creatinine 1.44, BUN 36, Potassium 3.9, Sodium 136, EGFR 35-42  09/21/2015 Creatinine 1.18, BUN 22, Potassium 4.6, Sodium 135, EGFR 44-53  09/07/2015 Creatinine 1.25, BUN 29, Potassium 4.6, Sodium 134, EGFR 41-50  07/31/2015 Creatinine 1.07, BUN 18, Potassium 4.5, Sodium 140, EGFR 49-60  08/12/2015 Creatinine 1.16, BUN 17, Potassium 4.7, Sodium 138, EGFR 45-55  07/03/2015 Creatinine 1.26, BUN 31, Potassium 5.2, Sodium 134, EGFR  41-50  Recommendations:  Advised patient to take medications as prescribed and reminded her she has been referred to HF clinic but she continues to decline due to lack of transportation.   Follow-up plan: ICM clinic phone appointment on 09/20/2016.  Copy of ICM check sent to primary cardiologist and device physician.   3 month ICM trend: 09/09/2016   1 Year ICM trend:      Rosalene Billings, RN 09/09/2016 10:29 AM

## 2016-09-20 ENCOUNTER — Ambulatory Visit (INDEPENDENT_AMBULATORY_CARE_PROVIDER_SITE_OTHER): Payer: Medicare Other

## 2016-09-20 DIAGNOSIS — I5022 Chronic systolic (congestive) heart failure: Secondary | ICD-10-CM

## 2016-09-20 DIAGNOSIS — Z95810 Presence of automatic (implantable) cardiac defibrillator: Secondary | ICD-10-CM

## 2016-09-20 NOTE — Progress Notes (Signed)
EPIC Encounter for ICM Monitoring  Patient Name: [name] is a 83 y.o. female Date: 09/20/2016 Primary Care Physican: Deloria Lair, MD Primary Cardiologist: Bronson Ing Electrophysiologist: Allred DryWeight: 158 lbs  Bi-V Pacing: 95%       Heart Failure questions reviewed, pt asymptomatic today but last week had gained 5 lbs but is now resolved.    Thoracic impedance close to baseline. Multiple peaks and valleys consistent with her use of Metolazone twice a week  Prescribed and confirmed dosage: Torsemide 20 mg 2 tablets (40 mg total) twice a day.  Metolazone 2.5 mg one tab by mouth on Monday & Friday.  Labs:  09/01/2016 Creatinine 1.27, BUN 33, Potassium 4.5, Sodium 134, EGFR 40-49 08/02/2016 Creatinine 1.32, BUN 44, Potassium 3.8, Sodium 135, EGFR 39-47 07/19/2016 Creatinine 1.24, BUN 42, Potassium 3.9, Sodium 135, EGFR 42-50 01/12/2016 Creatinine 1.44, BUN 36, Potassium 3.9, Sodium 136, EGFR 35-42  09/21/2015 Creatinine 1.18, BUN 22, Potassium 4.6, Sodium 135, EGFR 44-53  09/07/2015 Creatinine 1.25, BUN 29, Potassium 4.6, Sodium 134, EGFR 41-50  07/31/2015 Creatinine 1.07, BUN 18, Potassium 4.5, Sodium 140, EGFR 49-60  08/12/2015 Creatinine 1.16, BUN 17, Potassium 4.7, Sodium 138, EGFR 45-55  07/03/2015 Creatinine 1.26, BUN 31, Potassium 5.2, Sodium 134, EGFR 41-50  Recommendations: No changes. Reminded to limit dietary salt intake to 2000 mg/day and fluid intake to < 2 liters/day. Encouraged to call for fluid symptoms.  Follow-up plan: ICM clinic phone appointment on 10/27/2016.  Office appointment with Chanetta Marshall, NP on 10/13/2016 with echo cardiogram.  Copy of ICM check sent to primary cardiologist and device physician.   3 month ICM trend: 09/20/2016   1 Year ICM trend:      Rosalene Billings, RN 09/20/2016 9:40 AM

## 2016-09-29 ENCOUNTER — Other Ambulatory Visit: Payer: Medicare Other

## 2016-10-10 ENCOUNTER — Telehealth: Payer: Self-pay | Admitting: Internal Medicine

## 2016-10-10 ENCOUNTER — Telehealth: Payer: Self-pay | Admitting: Cardiovascular Disease

## 2016-10-10 MED ORDER — CARVEDILOL 3.125 MG PO TABS: 3.1250 mg | ORAL_TABLET | Freq: Two times a day (BID) | ORAL | 3 refills | Status: DC

## 2016-10-10 NOTE — Telephone Encounter (Signed)
[name] called the [place] office in regards to 2 appointments scheduled in [place] on 10/13/16.  She states that she has no transportation to get over to the [place] office.  Will try and make contact with [name].

## 2016-10-10 NOTE — Telephone Encounter (Signed)
Mrs. Towner called wanting to know if the carvedilol (COREG) 3.125 MG  Could be called in for 90 day supply instead of 30 day.  [place]  (please ask for  Delivery)  Please call patient .

## 2016-10-10 NOTE — Telephone Encounter (Signed)
Medication refilled with a 90 day supply. Patient notified

## 2016-10-11 NOTE — Telephone Encounter (Signed)
Please call patient. Is it that she can never come to [place] or this particular day won't work. The AV opt echo is to see if we can improve her heart pumping function. If there is any way she can get here one day to have it done then it may help.  Chanetta Marshall, NP 10/11/2016 8:46 AM

## 2016-10-13 ENCOUNTER — Other Ambulatory Visit (HOSPITAL_COMMUNITY): Payer: Medicare Other

## 2016-10-13 ENCOUNTER — Encounter: Payer: Medicare Other | Admitting: Nurse Practitioner

## 2016-10-27 ENCOUNTER — Ambulatory Visit (INDEPENDENT_AMBULATORY_CARE_PROVIDER_SITE_OTHER): Payer: Medicare Other

## 2016-10-27 DIAGNOSIS — Z95810 Presence of automatic (implantable) cardiac defibrillator: Secondary | ICD-10-CM

## 2016-10-27 DIAGNOSIS — I5022 Chronic systolic (congestive) heart failure: Secondary | ICD-10-CM | POA: Diagnosis not present

## 2016-10-28 NOTE — Progress Notes (Signed)
EPIC Encounter for ICM Monitoring  Patient Name: [name] is a 83 y.o. female Date: 10/28/2016 Primary Care Physican: Deloria Lair, MD Primary Cardiologist: Bronson Ing Electrophysiologist: Allred DryWeight: 158 lbs  Bi-V Pacing: 93.7%      Heart Failure questions reviewed, pt asymptomatic other than her usual weakness.    Thoracic impedance has multiple valleys below baseline and peaks to baseline that may be consistent with Metolazone twice a week.   Prescribed and confirmed dosage: Torsemide 20 mg 2 tablets (40 mg total) twice a day. Metolazone 2.5 mg one tab by mouth on Monday & Friday.  Labs:  09/01/2016 Creatinine 1.27, BUN 33, Potassium 4.5, Sodium 134, EGFR 40-49 08/02/2016 Creatinine 1.32, BUN 44, Potassium 3.8, Sodium 135, EGFR 39-47 07/19/2016 Creatinine 1.24, BUN 42, Potassium 3.9, Sodium 135, EGFR 42-50 01/12/2016 Creatinine 1.44, BUN 36, Potassium 3.9, Sodium 136, EGFR 35-42  09/21/2015 Creatinine 1.18, BUN 22, Potassium 4.6, Sodium 135, EGFR 44-53  09/07/2015 Creatinine 1.25, BUN 29, Potassium 4.6, Sodium 134, EGFR 41-50  07/31/2015 Creatinine 1.07, BUN 18, Potassium 4.5, Sodium 140, EGFR 49-60  08/12/2015 Creatinine 1.16, BUN 17, Potassium 4.7, Sodium 138, EGFR 45-55  07/03/2015 Creatinine 1.26, BUN 31, Potassium 5.2, Sodium 134, EGFR 41-50  Recommendations: No changes. Discussed to limit salt intake to 2000 mg/day and fluid intake to < 2 liters/day.  Encouraged to call for fluid symptoms.  Follow-up plan: ICM clinic phone appointment on 12/05/2016.  Defib office appointment scheduled on 12/23/2016 with Chanetta Marshall, NP and Dr Bronson Ing 01/24/2017.  Copy of ICM check sent to primary cardiologist and device physician.   3 month ICM trend: 10/28/2016   1 Year ICM trend:      Rosalene Billings, RN 10/28/2016 9:07 AM

## 2016-12-05 ENCOUNTER — Ambulatory Visit (INDEPENDENT_AMBULATORY_CARE_PROVIDER_SITE_OTHER): Payer: Medicare Other | Admitting: *Deleted

## 2016-12-05 ENCOUNTER — Telehealth: Payer: Self-pay

## 2016-12-05 DIAGNOSIS — I5022 Chronic systolic (congestive) heart failure: Secondary | ICD-10-CM

## 2016-12-05 DIAGNOSIS — I255 Ischemic cardiomyopathy: Secondary | ICD-10-CM

## 2016-12-05 DIAGNOSIS — Z95810 Presence of automatic (implantable) cardiac defibrillator: Secondary | ICD-10-CM

## 2016-12-05 NOTE — Progress Notes (Signed)
EPIC Encounter for ICM Monitoring  Patient Name: [name] is a 83 y.o. female Date: 12/05/2016 Primary Care Physican: Deloria Lair., MD Primary Cardiologist: Bronson Ing Electrophysiologist: Allred DryWeight: Last known weight 158lbs  Bi-V Pacing: 93.7%      Attempted call to patient and unable to reach.  Left message to return call.  Transmission reviewed.    Thoracic impedance abnormal suggesting fluid accumulation since 10/29/2016.  Prescribed and confirmed dosage: Torsemide 20 mg 2 tablets (40 mg total) twice a day. Metolazone 2.5 mg one tab by mouth on Monday &Friday.  Labs:  09/01/2016 Creatinine 1.27, BUN 33, Potassium 4.5, Sodium 134, EGFR 40-49 08/02/2016 Creatinine 1.32, BUN 44, Potassium 3.8, Sodium 135, EGFR 39-47 07/19/2016 Creatinine 1.24, BUN 42, Potassium 3.9, Sodium 135, EGFR 42-50 01/12/2016 Creatinine 1.44, BUN 36, Potassium 3.9, Sodium 136, EGFR 35-42  09/21/2015 Creatinine 1.18, BUN 22, Potassium 4.6, Sodium 135, EGFR 44-53  09/07/2015 Creatinine 1.25, BUN 29, Potassium 4.6, Sodium 134, EGFR 41-50  07/31/2015 Creatinine 1.07, BUN 18, Potassium 4.5, Sodium 140, EGFR 49-60  08/12/2015 Creatinine 1.16, BUN 17, Potassium 4.7, Sodium 138, EGFR 45-55  07/03/2015 Creatinine 1.26, BUN 31, Potassium 5.2, Sodium 134, EGFR 41-50  Recommendations: NONE - Unable to reach patient   Follow-up plan: ICM clinic phone appointment on 12/13/2016 to recheck fluid levels.  Office appointment scheduled on 12/23/2016 with Chanetta Marshall, NP.  Copy of ICM check sent to primary cardiologist and device physician.   3 month ICM trend: 12/05/2016   1 Year ICM trend:      Rosalene Billings, RN 12/05/2016 2:35 PM

## 2016-12-05 NOTE — Progress Notes (Signed)
Remote ICD transmission.   

## 2016-12-05 NOTE — Telephone Encounter (Signed)
Remote ICM transmission received.  Attempted patient call and left message to return call.   

## 2016-12-06 LAB — CUP PACEART REMOTE DEVICE CHECK
Brady Statistic AP VS Percent: 0.03 %
Brady Statistic AS VP Percent: 97.08 %
Brady Statistic RA Percent Paced: 1.34 %
Brady Statistic RV Percent Paced: 96.61 %
Date Time Interrogation Session: 20180611052303
HighPow Impedance: 52 Ohm
HighPow Impedance: 63 Ohm
Implantable Lead Implant Date: 20060424
Implantable Lead Location: 753859
Implantable Lead Model: 7120
Lead Channel Impedance Value: 4047 Ohm
Lead Channel Impedance Value: 4047 Ohm
Lead Channel Impedance Value: 437 Ohm
Lead Channel Impedance Value: 437 Ohm
Lead Channel Pacing Threshold Amplitude: 1 V
Lead Channel Pacing Threshold Pulse Width: 0.4 ms
Lead Channel Sensing Intrinsic Amplitude: 1.5 mV
Lead Channel Sensing Intrinsic Amplitude: 1.5 mV
Lead Channel Setting Pacing Amplitude: 2.25 V
Lead Channel Setting Pacing Pulse Width: 0.4 ms
Lead Channel Setting Pacing Pulse Width: 1 ms
MDC IDC LEAD IMPLANT DT: 20060424
MDC IDC LEAD IMPLANT DT: 20101202
MDC IDC LEAD LOCATION: 753858
MDC IDC LEAD LOCATION: 753860
MDC IDC MSMT BATTERY REMAINING LONGEVITY: 26 mo
MDC IDC MSMT BATTERY VOLTAGE: 2.93 V
MDC IDC MSMT LEADCHNL LV PACING THRESHOLD AMPLITUDE: 2.25 V
MDC IDC MSMT LEADCHNL LV PACING THRESHOLD PULSEWIDTH: 1 ms
MDC IDC MSMT LEADCHNL RA IMPEDANCE VALUE: 456 Ohm
MDC IDC MSMT LEADCHNL RV IMPEDANCE VALUE: 304 Ohm
MDC IDC MSMT LEADCHNL RV PACING THRESHOLD AMPLITUDE: 1.375 V
MDC IDC MSMT LEADCHNL RV PACING THRESHOLD PULSEWIDTH: 0.4 ms
MDC IDC MSMT LEADCHNL RV SENSING INTR AMPL: 9 mV
MDC IDC MSMT LEADCHNL RV SENSING INTR AMPL: 9 mV
MDC IDC PG IMPLANT DT: 20150402
MDC IDC SET LEADCHNL LV PACING AMPLITUDE: 3.25 V
MDC IDC SET LEADCHNL RV PACING AMPLITUDE: 2.75 V
MDC IDC SET LEADCHNL RV SENSING SENSITIVITY: 0.3 mV
MDC IDC STAT BRADY AP VP PERCENT: 1.32 %
MDC IDC STAT BRADY AS VS PERCENT: 1.58 %

## 2016-12-06 NOTE — Progress Notes (Signed)
Call to patient.  She said her weight has been fluctuating with 5 lb weight gain for last 2 days.  She said today her weight has dropped to 160 lbs and she took a Metolazone 2 days ago.  She said the Metolazone makes her so weak that she stays in bed after taking except for going to the bathroom. Advised will recheck her fluid levels 12/13/2016.

## 2016-12-07 ENCOUNTER — Encounter: Payer: Self-pay | Admitting: Cardiology

## 2016-12-13 ENCOUNTER — Ambulatory Visit (INDEPENDENT_AMBULATORY_CARE_PROVIDER_SITE_OTHER): Payer: Self-pay

## 2016-12-13 DIAGNOSIS — Z95810 Presence of automatic (implantable) cardiac defibrillator: Secondary | ICD-10-CM

## 2016-12-13 DIAGNOSIS — I5022 Chronic systolic (congestive) heart failure: Secondary | ICD-10-CM

## 2016-12-13 NOTE — Progress Notes (Signed)
EPIC Encounter for ICM Monitoring  Patient Name: [name] is a 83 y.o. female Date: 12/13/2016 Primary Care Physican: Deloria Lair., MD Primary Cardiologist: Bronson Ing Electrophysiologist: Allred DryWeight: 158lbs  Bi-V Pacing: 97.3%         Heart Failure questions reviewed, pt stated weight has decreased.   Thoracic impedance returned to normal.  Prescribed and confirmed dosage: Torsemide 20 mg 2 tablets (40 mg total) twice a day. Metolazone 2.5 mg one tab by mouth on Monday &Friday.  Labs:  09/01/2016 Creatinine 1.27, BUN 33, Potassium 4.5, Sodium 134, EGFR 40-49 08/02/2016 Creatinine 1.32, BUN 44, Potassium 3.8, Sodium 135, EGFR 39-47 07/19/2016 Creatinine 1.24, BUN 42, Potassium 3.9, Sodium 135, EGFR 42-50 01/12/2016 Creatinine 1.44, BUN 36, Potassium 3.9, Sodium 136, EGFR 35-42  09/21/2015 Creatinine 1.18, BUN 22, Potassium 4.6, Sodium 135, EGFR 44-53  09/07/2015 Creatinine 1.25, BUN 29, Potassium 4.6, Sodium 134, EGFR 41-50  07/31/2015 Creatinine 1.07, BUN 18, Potassium 4.5, Sodium 140, EGFR 49-60  08/12/2015 Creatinine 1.16, BUN 17, Potassium 4.7, Sodium 138, EGFR 45-55  07/03/2015 Creatinine 1.26, BUN 31, Potassium 5.2, Sodium 134, EGFR 41-50  Recommendations: No changes.  Advised to limit salt intake to 2000 mg/day and fluid intake to < 2 liters/day.  Encouraged to call for fluid symptoms.  Follow-up plan: ICM clinic phone appointment on 01/23/2017.  Office appointment scheduled on 12/23/2016 with Chanetta Marshall, NP and 01/24/2017 with Dr Bronson Ing  Copy of ICM check sent to primary cardiologist and device physician.   3 month ICM trend: 12/13/2016   1 Year ICM trend:      Rosalene Billings, RN 12/13/2016 11:20 AM

## 2016-12-18 NOTE — Progress Notes (Deleted)
    AV Optimization Echo Note Date: 12/18/2016  ID:  [name], DOB March 21, 1934, MRN [id number]  PCP: Deloria Lair., MD Primary Cardiologist: *** Electrophysiologist: ***  [name] is a 83 y.o. female is seen today for ***.  {He/she (caps):30048} presents today for AV optimization of CRT.  Since last being seen in our clinic, the patient reports *** doing very well.    The patient is predominately atrially sensed/paced***.  Device was programmed with a  PAV of *** and SAV of *** on presentation.  *** AV delays were evaluated from *** msec to *** msec.  Optimal separation of E/A waves was noted at *** msec.    Device was reprogrammed today to a SAV of ***msec and a PAV of ***msec.    The patient will return to see me*** in clinic in 4 weeks for follow-up.   Signed, Chanetta Marshall, NP  12/18/2016 7:49 PM     [place] [address] [phone] (office) [phone] (fax

## 2016-12-23 ENCOUNTER — Other Ambulatory Visit (HOSPITAL_COMMUNITY): Payer: Medicare Other

## 2016-12-23 ENCOUNTER — Encounter: Payer: Medicare Other | Admitting: Nurse Practitioner

## 2017-01-23 ENCOUNTER — Ambulatory Visit (INDEPENDENT_AMBULATORY_CARE_PROVIDER_SITE_OTHER): Payer: Medicare Other

## 2017-01-23 DIAGNOSIS — I5022 Chronic systolic (congestive) heart failure: Secondary | ICD-10-CM | POA: Diagnosis not present

## 2017-01-23 DIAGNOSIS — Z95810 Presence of automatic (implantable) cardiac defibrillator: Secondary | ICD-10-CM

## 2017-01-23 NOTE — Progress Notes (Signed)
EPIC Encounter for ICM Monitoring  Patient Name: [name] is a 83 y.o. female Date: 01/23/2017 Primary Care Physican: Deloria Lair., MD Primary Cardiologist: Bronson Ing Electrophysiologist: Allred Dry Weight: 161 lbs  Bi-V Pacing: 96.1%       Heart Failure questions reviewed, pt asymptomatic but does have fluctuating weight that increases by 2-3 pounds.   Thoracic impedance abnormal suggesting fluid accumulation with impedance with multiple peaks and valleys indicating pattern of fluid accumulation followed .  Prescribed dosage: Torsemide 20 mg 2 tablets (40 mg total) twice a day. Metolazone 2.5 mg one tab by mouth on Monday &Friday.  Recommendations: No changes.  Encouraged to call for fluid symptoms.  Follow-up plan: ICM clinic phone appointment on 02/23/2017.  Office appointment scheduled 01/24/2017 with Dr. Bronson Ing.  Copy of ICM check sent to Dr Bronson Ing and Dr Rayann Heman.   3 month ICM trend: 01/23/2017   1 Year ICM trend:      Rosalene Billings, RN 01/23/2017 2:10 PM

## 2017-01-24 ENCOUNTER — Encounter: Payer: Self-pay | Admitting: Cardiovascular Disease

## 2017-01-24 ENCOUNTER — Ambulatory Visit (INDEPENDENT_AMBULATORY_CARE_PROVIDER_SITE_OTHER): Payer: Medicare Other | Admitting: Cardiovascular Disease

## 2017-01-24 VITALS — BP 128/69 | HR 65 | Ht 65.0 in | Wt 168.0 lb

## 2017-01-24 DIAGNOSIS — R531 Weakness: Secondary | ICD-10-CM

## 2017-01-24 DIAGNOSIS — E782 Mixed hyperlipidemia: Secondary | ICD-10-CM

## 2017-01-24 DIAGNOSIS — I5032 Chronic diastolic (congestive) heart failure: Secondary | ICD-10-CM | POA: Diagnosis not present

## 2017-01-24 DIAGNOSIS — R0602 Shortness of breath: Secondary | ICD-10-CM

## 2017-01-24 DIAGNOSIS — I25118 Atherosclerotic heart disease of native coronary artery with other forms of angina pectoris: Secondary | ICD-10-CM | POA: Diagnosis not present

## 2017-01-24 DIAGNOSIS — Z95810 Presence of automatic (implantable) cardiac defibrillator: Secondary | ICD-10-CM | POA: Diagnosis not present

## 2017-01-24 MED ORDER — METOLAZONE 2.5 MG PO TABS: ORAL_TABLET | ORAL | Status: DC

## 2017-01-24 NOTE — Progress Notes (Signed)
SUBJECTIVE: The patient presents for follow-up of chronic diastolic heart failure and coronary artery disease.   Echocardiogram on 06/05/15 demonstrated that left ventricular systolic function had normalized when compared to July 2014 , LVEF 55%.  I have made numerous attempts to get her evaluated at the advanced heart failure clinic in [place] but she has consistently refused.  Dr. Rayann Heman ordered an echocardiogram at her visit with him in March 2018 but this was not obtained.  Nuclear stress test on 06/10/15 demonstrated extensive prior myocardial scar in the anterior, lateral, and inferior walls, with very minimal peri-infarct ischemia in the anterior wall.  Thoracic impedance yesterday demonstrated abnormalities suggestive of fluid accumulation with multiple peaks and valleys. She has been asymptomatic but does have fluctuating weight that increases by 2-3 pounds. She currently takes torsemide 40 mg twice daily and metolazone 2.5 mg prn.  ECG performed in the office today which I ordered personally interpreted demonstrated a ventricular paced rhythm.  She complains of shortness of breath and fatigue. She also complains of bilateral calf pain and weakness. She only takes metolazone as needed rather than 2.5 mg every Monday and Friday as prescribed.  She is here with her great niece, Lesly Rubenstein, who agrees to take her to all her appointments.  She denies chest pain. She has gained 2 pounds by her home scale.    Review of Systems: As per "subjective", otherwise negative.  Allergies  Allergen Reactions  . Augmentin [Amoxicillin-Pot Clavulanate]     Abdominal pain  . Metformin     Upset stomach  . Metolazone     "made everything hurt" 07/03/15  . Sulfonamide Derivatives     swelling    Current Outpatient Prescriptions  Medication Sig Dispense Refill  . ALPRAZolam (XANAX) 0.5 MG tablet Take 0.5 mg by mouth at bedtime as needed for anxiety.    . Ascorbic Acid (VITAMIN C)  100 MG tablet Take 100 mg by mouth daily.    Marland Kitchen aspirin 81 MG chewable tablet Chew 1 tablet (81 mg total) by mouth daily.    . carvedilol (COREG) 3.125 MG tablet Take 1 tablet (3.125 mg total) by mouth 2 (two) times daily with a meal. 180 tablet 3  . citalopram (CELEXA) 20 MG tablet Take 20 mg by mouth daily.    . cyanocobalamin 2000 MCG tablet Take 2,000 mcg by mouth daily.    . ergocalciferol (VITAMIN D2) 50000 units capsule Take 50,000 Units by mouth 2 (two) times a week.    Marland Kitchen HYDROmorphone (DILAUDID) 4 MG tablet Take 6 mg by mouth every 6 (six) hours as needed for severe pain.     Marland Kitchen insulin glargine (LANTUS) 100 UNIT/ML injection Inject 40 Units into the skin 2 (two) times daily.     . metolazone (ZAROXOLYN) 2.5 MG tablet Take one tab by mouth on Monday & Friday (Patient taking differently: Take one tab by mouth on Monday, Wednesday & Friday)    . Nebulizer MISC Inhale into the lungs.    . nitroGLYCERIN (NITROSTAT) 0.4 MG SL tablet Place 1 tablet (0.4 mg total) under the tongue every 5 (five) minutes as needed for chest pain. 25 tablet 3  . ondansetron (ZOFRAN) 4 MG tablet Take 4 mg by mouth every 8 (eight) hours as needed for nausea or vomiting.    . OXYGEN-HELIUM IN 2 L as needed.    . promethazine (PHENERGAN) 25 MG tablet Take 25 mg by mouth every 6 (six) hours as needed for nausea  or vomiting.    . ranitidine (ZANTAC) 150 MG tablet Take 150 mg by mouth 2 (two) times daily.     Marland Kitchen torsemide (DEMADEX) 20 MG tablet Take 2 tablets (40 mg total) by mouth 2 (two) times daily. 120 tablet 6   No current facility-administered medications for this visit.     Past Medical History:  Diagnosis Date  . Aneurysm ([place])    Apical; previously on Coumadin  . Anxiety disorder   . Cancer (HCC)    Skin  . Chronic renal insufficiency    Creatinine 1.17 January 2010  . Coronary artery disease    Cath 02/15/12 revealed RCA as culprit lesion w/ occlusion at site of prior stent, patent LAD stent, chronic LCx  and Diagonal dz; Medical therapy   . Depression   . GERD (gastroesophageal reflux disease)   . Hyperlipidemia    H/o GI intolerance to Lipitor  . Impingement syndrome of left shoulder   . Ischemic cardiomyopathy    s/p Medtronic BiV ICD 2006  . Peripheral polyneuropathy    Refractory to several medications  . Systolic and diastolic CHF, chronic (HCC)    echo 03/2012 EF 93-71%, mod diastolic dysfunction, mod MR, mod LAE, mod-severe TR, PASP 79mmHg  . Type 2 diabetes mellitus ([place])     Past Surgical History:  Procedure Laterality Date  . ABDOMINAL HYSTERECTOMY    . CARDIAC DEFIBRILLATOR PLACEMENT  05/28/2009   Medtronic ICD placed by Dr. Caryl Comes secondary to ICM/CHF, [phone] Fidelis lead fracture with ICD storm 2010 requiring new RV lead placement  . CHOLECYSTECTOMY    . EYE SURGERY    . IMPLANTABLE CARDIOVERTER DEFIBRILLATOR GENERATOR CHANGE  09/26/13   BiV ICD generator change by Dr Rayann Heman - MDT [place] Proud XT CRT-D  . IMPLANTABLE CARDIOVERTER DEFIBRILLATOR GENERATOR CHANGE N/A 09/26/2013   Procedure: IMPLANTABLE CARDIOVERTER DEFIBRILLATOR GENERATOR CHANGE;  Surgeon: Coralyn Mark, MD;  Location: [facility] CATH LAB;  Service: Cardiovascular;  Laterality: N/A;  . LEFT HEART CATHETERIZATION WITH CORONARY ANGIOGRAM N/A 02/15/2012   Procedure: LEFT HEART CATHETERIZATION WITH CORONARY ANGIOGRAM;  Surgeon: Peter M Martinique, MD;  Location: [facility] CATH LAB;  Service: Cardiovascular;  Laterality: N/A;  . RIGHT HEART CATHETERIZATION N/A 04/27/2012   Procedure: RIGHT HEART CATH;  Surgeon: Larey Dresser, MD;  Location: [facility] CATH LAB;  Service: Cardiovascular;  Laterality: N/A;    Social History   Social History  . Marital status: Widowed    Spouse name: N/A  . Number of children: N/A  . Years of education: N/A   Occupational History  . Retired Retired   Social History Main Topics  . Smoking status: Former Smoker    Packs/day: 0.80    Years: 30.00    Types: Cigarettes    Start date: 04/05/1957    Quit date:  06/27/2002  . Smokeless tobacco: Never Used  . Alcohol use No  . Drug use: No  . Sexual activity: Not Currently   Other Topics Concern  . Not on file   Social History Narrative  . No narrative on file     Vitals:   01/24/17 1417  BP: 128/69  Pulse: 65  SpO2: 96%  Weight: 168 lb (76.2 kg)  Height: 5\' 5"  (1.651 m)    Wt Readings from Last 3 Encounters:  01/24/17 168 lb (76.2 kg)  09/02/16 165 lb (74.8 kg)  07/19/16 164 lb (74.4 kg)     PHYSICAL EXAM General: NAD HEENT: Normal. Neck: No JVD, no thyromegaly. Lungs: Poor air  movement, expiratory wheezes bilaterally. No crackles. CV: Nondisplaced PMI.  Regular rate and rhythm, normal S1/S2, no S3/S4, no murmur. No pretibial or periankle edema.  Abdomen: Soft, nontender, no distention.  Neurologic: Alert and oriented.  Psych: Normal affect. Skin: Normal. Musculoskeletal: No gross deformities.    ECG: Most recent ECG reviewed.   Labs:    Lipids: Lab Results  Component Value Date/Time   LDLCALC 68 06/06/2015 05:03 AM   CHOL 146 06/06/2015 05:03 AM   TRIG 289 (H) 06/06/2015 05:03 AM   HDL 20 (L) 06/06/2015 05:03 AM       ASSESSMENT AND PLAN:  1. CAD with ischemic cardiomyopathy: Stable ischemic heart disease with LVEF normalization. Continue ASA and Coreg.   2. Chronic diastolic heart failure with shortness of breath: Thoracic impedance yesterday demonstrated abnormalities suggestive of fluid accumulation with multiple peaks and valleys. She has been asymptomatic but does have fluctuating weight that increases by 2-3 pounds. She currently takes torsemide 40 mg twice daily and metolazone 2.5 mg prn rather than as prescribed every Monday and Friday. She is wheezing and I have asked her to use her inhaler when she gets home. I will obtain an echocardiogram and chest x-ray. I will check a basic metabolic panel and magnesium. I will again make a referral to the advanced heart failure clinic in [place]. Her great  niece, Lesly Rubenstein, has agreed to take her to all of her appointments.  3. Hyperlipidemia: Has not wanted to try alternative statin therapy.  4. CRT-D: Functioning normally. Followed by Dr. Rayann Heman.     Disposition: Follow up with advanced HF clinic. Follow up with me to be determined.  Time spent: 40 minutes, of which greater than 50% was spent reviewing symptoms, relevant blood tests and studies, and discussing management plan with the patient.    Kate Sable, M.D., F.A.C.C.

## 2017-01-24 NOTE — Patient Instructions (Addendum)
Medication Instructions:   Please take your Metolazone every Monday and Friday.  NOT AS NEEDED.    Use inhaler at home 2 puffs twice a day as instructed by provider today.  Continue all other medications.    Labwork:  BMET, Magnesium - orders given today - can be done within the next 7 days.    Office will contact with results via phone or letter.   Testing/Procedures:  Your physician has requested that you have an echocardiogram. Echocardiography is a painless test that uses sound waves to create images of your heart. It provides your doctor with information about the size and shape of your heart and how well your heart's chambers and valves are working. This procedure takes approximately one hour. There are no restrictions for this procedure.  A chest x-ray takes a picture of the organs and structures inside the chest, including the heart, lungs, and blood vessels. This test can show several things, including, whether the heart is enlarges; whether fluid is building up in the lungs; and whether pacemaker / defibrillator leads are still in place.  Office will contact with results via phone or letter.    Follow-Up: To be determined.    Any Other Special Instructions Will Be Listed Below (If Applicable). You have been referred to:  Heart Failure Clinic.    If you need a refill on your cardiac medications before your next appointment, please call your pharmacy.

## 2017-01-27 ENCOUNTER — Other Ambulatory Visit: Payer: Self-pay

## 2017-01-27 MED ORDER — METOLAZONE 2.5 MG PO TABS: ORAL_TABLET | ORAL | 1 refills | Status: DC

## 2017-02-06 ENCOUNTER — Telehealth: Payer: Self-pay | Admitting: Cardiovascular Disease

## 2017-02-06 NOTE — Telephone Encounter (Signed)
Pre-cert Verification for the following procedure   Echo scheduled for 03-01-2017

## 2017-02-24 ENCOUNTER — Ambulatory Visit (INDEPENDENT_AMBULATORY_CARE_PROVIDER_SITE_OTHER): Payer: Medicare Other

## 2017-02-24 DIAGNOSIS — I5032 Chronic diastolic (congestive) heart failure: Secondary | ICD-10-CM | POA: Diagnosis not present

## 2017-02-24 DIAGNOSIS — Z95810 Presence of automatic (implantable) cardiac defibrillator: Secondary | ICD-10-CM | POA: Diagnosis not present

## 2017-02-24 NOTE — Progress Notes (Signed)
EPIC Encounter for ICM Monitoring  Patient Name: [name] is a 83 y.o. female Date: 02/24/2017 Primary Care Physican: Deloria Lair., MD Primary Cardiologist: Bronson Ing Electrophysiologist: Allred Dry Weight:   159 lbs  Bi-V Pacing: 96.5%        Heart Failure questions reviewed, pt asymptomatic for fluid symptoms but feels weak and sluggish all the time.   Thoracic impedance normal today and continues a pattern of multiple valleys suggesting fluid accumulation with peaks to baseline.  Prescribed dosage: Torsemide 20 mg 2 tablets (40 mg total) twice a day. Metolazone 2.5 mg one tab by mouth on Monday &Friday.  She takes Metolazone PRN instead of twice a week.   Recommendations: No changes.   Encouraged to call for fluid symptoms.  Follow-up plan: ICM clinic phone appointment on 04/03/2017.  Office appointment scheduled 03/07/2017 with HF clinic for 1st visit.    Copy of ICM check sent to Dr. Bronson Ing and Dr. Rayann Heman.   3 month ICM trend: 02/24/2017   1 Year ICM trend:      Rosalene Billings, RN 02/24/2017 11:59 AM

## 2017-02-28 ENCOUNTER — Telehealth: Payer: Self-pay | Admitting: Cardiovascular Disease

## 2017-02-28 NOTE — Telephone Encounter (Signed)
Pt was mistaken on echo thought she already had one at July office visit but explained that test was EKG that they were 2 different test. Pt voiced understanding and will come tomorrow for echo

## 2017-02-28 NOTE — Telephone Encounter (Signed)
Patient wants to know why she is having Echo    She thinks she has already had done once this year?

## 2017-03-01 ENCOUNTER — Other Ambulatory Visit: Payer: Self-pay

## 2017-03-01 ENCOUNTER — Ambulatory Visit (INDEPENDENT_AMBULATORY_CARE_PROVIDER_SITE_OTHER): Payer: Medicare Other

## 2017-03-01 ENCOUNTER — Telehealth: Payer: Self-pay | Admitting: Cardiovascular Disease

## 2017-03-01 DIAGNOSIS — I5032 Chronic diastolic (congestive) heart failure: Secondary | ICD-10-CM | POA: Diagnosis not present

## 2017-03-01 NOTE — Telephone Encounter (Signed)
LM to return call.

## 2017-03-01 NOTE — Telephone Encounter (Signed)
Wants to know what her appointment for Dr Marigene Ehlers is for and if it will be ongoing

## 2017-03-03 NOTE — Telephone Encounter (Signed)
Notes recorded by Laurine Blazer, LPN on 08/04/2058 at 15:61 AM EDT Patient notified. Copy to pmd. ------  ECHO -   Notes recorded by Herminio Commons, MD on 03/01/2017 at 4:36 PM EDT Pumping function is now mildly reduced. Evidence of prior heart attack seen previously on echo.

## 2017-03-03 NOTE — Telephone Encounter (Signed)
Patient calling back.  The initial appointment for 03/07/17 has already been filled.  Rescheduled for Thursday, April 06, 2017 at 11:40 with Dr. Aundra Dubin.

## 2017-03-03 NOTE — Telephone Encounter (Signed)
Patient had questions about seeing Dr. Aundra Dubin.  Stated that her great niece is her transportation and that she just got a job.  Stated that she cancelled the appointment that was initially scheduled for 03/07/2017 due to this reason.  Stated that she will be coming by her house later this evening.  Asked her to discuss with her & call back with some dates & times and we will call to reschedule to work this out for her.  Patient verbalized understanding.

## 2017-03-07 ENCOUNTER — Encounter (HOSPITAL_COMMUNITY): Payer: Medicare Other | Admitting: Cardiology

## 2017-03-21 ENCOUNTER — Telehealth: Payer: Self-pay | Admitting: Cardiovascular Disease

## 2017-03-21 NOTE — Telephone Encounter (Signed)
Mrs. Norbeck called stating that she is going to have to cancel the upcoming appointment with Dr. Aundra Dubin due to  transportation issues. She wanted to discuss this with Dr. Bronson Ing.

## 2017-03-22 NOTE — Telephone Encounter (Signed)
Stated that her great niece was going to take her, but she got a job & now works 5 days a week in [place].  Has no other family or friends to take her.  Tried one of the [employer] transportation & to go to [place] is $100.00.  Has other family members that are not well either.

## 2017-03-23 ENCOUNTER — Telehealth: Payer: Self-pay | Admitting: Cardiovascular Disease

## 2017-03-23 NOTE — Telephone Encounter (Signed)
Mrs. Bless called requesting to speak with Edd Fabian.  States that Edd Fabian is working on something for her.

## 2017-03-24 NOTE — Telephone Encounter (Signed)
Patient informed that I have not found any other type of transportation for her.  [employer] will do local services for Medicare patient's, but not out of [facility].  Most Medicare patients have to use private pay services to go to [place].  Services such as [employer], [place] have price ranging from $85.00-$100.00 for travel outside of [facility].  Patient stated that she has rescheduled her appointment out to 05-08-2017 due to having so many other appointments close together.  Stated that she will try to get with other family members since this is out a little farther to see if something can be arranged.

## 2017-04-03 ENCOUNTER — Ambulatory Visit (INDEPENDENT_AMBULATORY_CARE_PROVIDER_SITE_OTHER): Payer: Medicare Other | Admitting: *Deleted

## 2017-04-03 DIAGNOSIS — I5032 Chronic diastolic (congestive) heart failure: Secondary | ICD-10-CM

## 2017-04-03 DIAGNOSIS — Z95810 Presence of automatic (implantable) cardiac defibrillator: Secondary | ICD-10-CM | POA: Diagnosis not present

## 2017-04-03 DIAGNOSIS — I255 Ischemic cardiomyopathy: Secondary | ICD-10-CM

## 2017-04-03 NOTE — Progress Notes (Signed)
EPIC Encounter for ICM Monitoring  Patient Name: [name] is a 83 y.o. female Date: 04/03/2017 Primary Care Physican: Deloria Lair., MD Primary Cardiologist: Bronson Ing Electrophysiologist: Allred Dry Weight:162lbs  Bi-V Pacing:  96.9%       Heart Failure questions reviewed, pt reported she is feeling the same, leg weakness and tired.  No new symptoms.   Thoracic impedance continues a pattern of multiple valleys suggesting fluid accumulation with peaks to baseline which correlates with Metolazone use.    Prescribed dosage: Torsemide 20 mg 2 tablets (40 mg total) twice a day. Metolazone 2.5 mg one tab by mouth on Monday &Friday.  She takes Metolazone PRN instead of twice a week.   Recommendations: No changes.   Encouraged to call for fluid symptoms.  Follow-up plan: ICM clinic phone appointment on 05/04/2017.    Copy of ICM check sent to Dr. Rayann Heman.   3 month ICM trend: 11/01/2016   1 Year ICM trend:      Rosalene Billings, RN 04/03/2017 12:23 PM

## 2017-04-04 NOTE — Progress Notes (Signed)
Remote ICD transmission.   

## 2017-04-05 ENCOUNTER — Encounter: Payer: Self-pay | Admitting: *Deleted

## 2017-04-05 LAB — CUP PACEART REMOTE DEVICE CHECK
Battery Voltage: 2.93 V
Brady Statistic AS VP Percent: 96.53 %
Brady Statistic RA Percent Paced: 1.95 %
HighPow Impedance: 56 Ohm
HighPow Impedance: 67 Ohm
Implantable Lead Implant Date: 20060424
Implantable Lead Implant Date: 20060424
Implantable Lead Location: 753859
Implantable Lead Location: 753860
Implantable Lead Model: 5076
Implantable Lead Model: 7120
Lead Channel Impedance Value: 4047 Ohm
Lead Channel Impedance Value: 456 Ohm
Lead Channel Impedance Value: 456 Ohm
Lead Channel Pacing Threshold Amplitude: 1.125 V
Lead Channel Pacing Threshold Pulse Width: 0.4 ms
Lead Channel Pacing Threshold Pulse Width: 0.4 ms
Lead Channel Pacing Threshold Pulse Width: 1 ms
Lead Channel Sensing Intrinsic Amplitude: 1.375 mV
Lead Channel Sensing Intrinsic Amplitude: 1.375 mV
Lead Channel Sensing Intrinsic Amplitude: 11.625 mV
Lead Channel Setting Pacing Amplitude: 2.25 V
Lead Channel Setting Sensing Sensitivity: 0.3 mV
MDC IDC LEAD IMPLANT DT: 20101202
MDC IDC LEAD LOCATION: 753858
MDC IDC MSMT BATTERY REMAINING LONGEVITY: 21 mo
MDC IDC MSMT LEADCHNL LV IMPEDANCE VALUE: 4047 Ohm
MDC IDC MSMT LEADCHNL LV PACING THRESHOLD AMPLITUDE: 2.375 V
MDC IDC MSMT LEADCHNL RV IMPEDANCE VALUE: 323 Ohm
MDC IDC MSMT LEADCHNL RV IMPEDANCE VALUE: 456 Ohm
MDC IDC MSMT LEADCHNL RV PACING THRESHOLD AMPLITUDE: 1.125 V
MDC IDC MSMT LEADCHNL RV SENSING INTR AMPL: 11.625 mV
MDC IDC PG IMPLANT DT: 20150402
MDC IDC SESS DTM: 20181008103424
MDC IDC SET LEADCHNL LV PACING AMPLITUDE: 3.5 V
MDC IDC SET LEADCHNL LV PACING PULSEWIDTH: 1 ms
MDC IDC SET LEADCHNL RV PACING AMPLITUDE: 2.75 V
MDC IDC SET LEADCHNL RV PACING PULSEWIDTH: 0.4 ms
MDC IDC STAT BRADY AP VP PERCENT: 1.94 %
MDC IDC STAT BRADY AP VS PERCENT: 0.02 %
MDC IDC STAT BRADY AS VS PERCENT: 1.51 %
MDC IDC STAT BRADY RV PERCENT PACED: 96.89 %

## 2017-04-06 ENCOUNTER — Encounter (HOSPITAL_COMMUNITY): Payer: Medicare Other | Admitting: Cardiology

## 2017-04-07 ENCOUNTER — Encounter: Payer: Self-pay | Admitting: Cardiology

## 2017-04-17 ENCOUNTER — Other Ambulatory Visit: Payer: Self-pay | Admitting: Cardiovascular Disease

## 2017-05-04 ENCOUNTER — Telehealth: Payer: Self-pay

## 2017-05-04 ENCOUNTER — Ambulatory Visit (INDEPENDENT_AMBULATORY_CARE_PROVIDER_SITE_OTHER): Payer: Medicare Other

## 2017-05-04 DIAGNOSIS — I5032 Chronic diastolic (congestive) heart failure: Secondary | ICD-10-CM

## 2017-05-04 DIAGNOSIS — Z95810 Presence of automatic (implantable) cardiac defibrillator: Secondary | ICD-10-CM

## 2017-05-04 NOTE — Progress Notes (Signed)
EPIC Encounter for ICM Monitoring  Patient Name: [name] is a 83 y.o. female Date: 05/04/2017 Primary Care Physican: Deloria Lair., MD Primary Cardiologist: Bronson Ing Electrophysiologist: Allred Dry Weight:Previous weight 162lbs  Bi-V Pacing:  97.7%       Attempted call to patient and unable to reach.   Transmission reviewed.    Optivol: Thoracic impedance normal.  Prescribed dosage: Torsemide 20 mg 2 tablets (40 mg total) twice a day. Metolazone 2.5 mg one tab by mouth on Monday &Friday. She takes Metolazone PRN instead of twice a week.   Recommendations: NONE - Unable to reach.  Follow-up plan: ICM clinic phone appointment on 06/06/2017.    Copy of ICM check sent to Dr. Rayann Heman.   3 month ICM trend: 05/04/2017    1 Year ICM trend:       Rosalene Billings, RN 05/04/2017 2:33 PM

## 2017-05-04 NOTE — Telephone Encounter (Signed)
Remote ICM transmission received.  Attempted call to patient and recording stated due to network difficulty the call would not go through

## 2017-05-08 ENCOUNTER — Encounter (HOSPITAL_COMMUNITY): Payer: Medicare Other | Admitting: Cardiology

## 2017-06-06 ENCOUNTER — Ambulatory Visit (INDEPENDENT_AMBULATORY_CARE_PROVIDER_SITE_OTHER): Payer: Medicare Other

## 2017-06-06 DIAGNOSIS — Z95810 Presence of automatic (implantable) cardiac defibrillator: Secondary | ICD-10-CM | POA: Diagnosis not present

## 2017-06-06 DIAGNOSIS — I5032 Chronic diastolic (congestive) heart failure: Secondary | ICD-10-CM | POA: Diagnosis not present

## 2017-06-08 NOTE — Progress Notes (Signed)
Try taking 20 mg of extra torsemide for the next 2 days.

## 2017-06-08 NOTE — Progress Notes (Signed)
EPIC Encounter for ICM Monitoring  Patient Name: [name] is a 83 y.o. female Date: 06/08/2017 Primary Care Physican: Deloria Lair., MD Primary Cardiologist: Bronson Ing Electrophysiologist: Allred Dry Weight:163lbs  Bi-V Pacing:97.7%          Heart Failure questions reviewed, pt symptomatic:  --weight gain of 2-3 pounds.   Thoracic impedance abnormal suggesting fluid accumulation 05/01/2017 to 06/08/2017.  Prescribed dosage: Torsemide 20 mg 2 tablets (40 mg total) twice a day. Metolazone 2.5 mg one tab by mouth on Monday &Friday. She takes Metolazone PRN instead of twice a week.  Recommendations:  She has not taken Metolazone for the last 6 weeks because makes her feel too weak.  She canceled the appointments with HF clinic and not rescheduling due to transportation issues.   Patient said she is unable to take or do anything else for her fluid accumulation.   Follow-up plan: ICM clinic phone appointment on 06/15/2017 to recheck fluid symptoms.    Copy of ICM check sent to Dr. Rayann Heman and Dr. Bronson Ing for review.   3 month ICM trend: 06/06/2017    1 Year ICM trend:       Rosalene Billings, RN 06/08/2017 5:02 PM

## 2017-06-09 NOTE — Progress Notes (Signed)
Received: Today  Message Contents  Shirley Commons, MD  Short, Laurie Panda, RN        That is fine, [name].       Message to Dr Bronson Ing advising him that did not provide recommendation of increasing Torsemide for 2 days due to patient took Metolazone this AM.

## 2017-06-09 NOTE — Progress Notes (Signed)
Call to patient to give Dr Bronson Ing order to take extra Torsemide.  She said her weight was up 2 more pounds this morning, 165 lbs, so she did take a Metolazone this morning and weight has dropped to 161 lbs this afternoon.  Advised to take prescribed Torsemide dosage without any extra since she took Metolazone this AM.   Advised if Dr Bronson Ing still wants there to take extra Torsemide then I will call her back.

## 2017-06-15 ENCOUNTER — Ambulatory Visit (INDEPENDENT_AMBULATORY_CARE_PROVIDER_SITE_OTHER): Payer: Self-pay

## 2017-06-15 DIAGNOSIS — I5032 Chronic diastolic (congestive) heart failure: Secondary | ICD-10-CM

## 2017-06-15 DIAGNOSIS — Z95810 Presence of automatic (implantable) cardiac defibrillator: Secondary | ICD-10-CM

## 2017-06-16 NOTE — Progress Notes (Signed)
EPIC Encounter for ICM Monitoring  Patient Name: [name] is a 83 y.o. female Date: 06/16/2017 Primary Care Physican: Deloria Lair., MD Primary Cardiologist: Bronson Ing Electrophysiologist: Allred Dry Weight:158lbs  Bi-V Pacing:97.5%       Heart Failure questions reviewed, pt asymptomatic. Weight dropped from 163 lbs to 158 lbs   Thoracic impedance returned to normal after taking Metolazone 06/06/2017.  Prescribed dosage: Torsemide 20 mg 2 tablets (40 mg total) twice a day. Metolazone 2.5 mg one tab by mouth on Monday &Friday. She takes Metolazone PRN instead of twice a week.  Recommendations:  None.   Recommendations: No changes.   Encouraged to call for fluid symptoms.  Follow-up plan: ICM clinic phone appointment on 07/10/2017.    Copy of ICM check sent to Dr. Rayann Heman and Dr Bronson Ing.   3 month ICM trend: 06/15/2017    1 Year ICM trend:       Rosalene Billings, RN 06/16/2017 2:22 PM

## 2017-07-11 ENCOUNTER — Ambulatory Visit (INDEPENDENT_AMBULATORY_CARE_PROVIDER_SITE_OTHER): Payer: Medicare Other | Admitting: *Deleted

## 2017-07-11 DIAGNOSIS — I5032 Chronic diastolic (congestive) heart failure: Secondary | ICD-10-CM

## 2017-07-11 DIAGNOSIS — Z95810 Presence of automatic (implantable) cardiac defibrillator: Secondary | ICD-10-CM

## 2017-07-11 DIAGNOSIS — I255 Ischemic cardiomyopathy: Secondary | ICD-10-CM

## 2017-07-13 NOTE — Progress Notes (Signed)
EPIC Encounter for ICM Monitoring  Patient Name: [name] is a 84 y.o. female Date: 07/13/2017 Primary Care Physican: Deloria Lair., MD Primary Cardiologist: Bronson Ing Electrophysiologist: Allred Dry Weight:162lbs  Bi-V Pacing:97.2%          Heart Failure questions reviewed, pt asymptomatic.   Thoracic impedance abnormal suggesting fluid accumulation.  Impedance with multiple peaks and valleys indicating pattern of fluid accumulation followed by dryness.   Prescribed dosage: Torsemide 20 mg 2 tablets (40 mg total) twice a day. Metolazone 2.5 mg one tab by mouth on Monday &Friday. She takes Metolazone PRN instead of twice a week.  Recommendations: No changes.   Encouraged to call for fluid symptoms.  Follow-up plan: ICM clinic phone appointment on 08/14/2017.    Copy of ICM check sent to Dr. Rayann Heman and Dr. Bronson Ing.   3 month ICM trend: 07/11/2017    1 Year ICM trend:       Rosalene Billings, RN 07/13/2017 3:17 PM

## 2017-07-13 NOTE — Progress Notes (Signed)
Remote ICD transmission.   

## 2017-07-14 ENCOUNTER — Encounter: Payer: Self-pay | Admitting: Cardiology

## 2017-07-14 LAB — CUP PACEART REMOTE DEVICE CHECK
Brady Statistic AP VP Percent: 2.38 %
Brady Statistic AP VS Percent: 0.03 %
Brady Statistic AS VP Percent: 96.07 %
Brady Statistic RA Percent Paced: 2.39 %
Date Time Interrogation Session: 20190115062304
HIGH POWER IMPEDANCE MEASURED VALUE: 71 Ohm
HighPow Impedance: 54 Ohm
Implantable Lead Implant Date: 20060424
Implantable Lead Location: 753858
Implantable Lead Location: 753860
Implantable Lead Model: 7120
Lead Channel Impedance Value: 4047 Ohm
Lead Channel Impedance Value: 4047 Ohm
Lead Channel Impedance Value: 437 Ohm
Lead Channel Impedance Value: 570 Ohm
Lead Channel Pacing Threshold Pulse Width: 0.4 ms
Lead Channel Pacing Threshold Pulse Width: 1 ms
Lead Channel Sensing Intrinsic Amplitude: 12.875 mV
Lead Channel Setting Pacing Amplitude: 2.25 V
Lead Channel Setting Pacing Amplitude: 2.75 V
Lead Channel Setting Pacing Pulse Width: 0.4 ms
Lead Channel Setting Pacing Pulse Width: 1 ms
MDC IDC LEAD IMPLANT DT: 20060424
MDC IDC LEAD IMPLANT DT: 20101202
MDC IDC LEAD LOCATION: 753859
MDC IDC MSMT BATTERY REMAINING LONGEVITY: 18 mo
MDC IDC MSMT BATTERY VOLTAGE: 2.93 V
MDC IDC MSMT LEADCHNL LV PACING THRESHOLD AMPLITUDE: 2.25 V
MDC IDC MSMT LEADCHNL RA IMPEDANCE VALUE: 494 Ohm
MDC IDC MSMT LEADCHNL RA PACING THRESHOLD AMPLITUDE: 1.125 V
MDC IDC MSMT LEADCHNL RA SENSING INTR AMPL: 1.375 mV
MDC IDC MSMT LEADCHNL RA SENSING INTR AMPL: 1.375 mV
MDC IDC MSMT LEADCHNL RV IMPEDANCE VALUE: 323 Ohm
MDC IDC MSMT LEADCHNL RV PACING THRESHOLD AMPLITUDE: 1.375 V
MDC IDC MSMT LEADCHNL RV PACING THRESHOLD PULSEWIDTH: 0.4 ms
MDC IDC MSMT LEADCHNL RV SENSING INTR AMPL: 12.875 mV
MDC IDC PG IMPLANT DT: 20150402
MDC IDC SET LEADCHNL LV PACING AMPLITUDE: 3.25 V
MDC IDC SET LEADCHNL RV SENSING SENSITIVITY: 0.3 mV
MDC IDC STAT BRADY AS VS PERCENT: 1.52 %
MDC IDC STAT BRADY RV PERCENT PACED: 97.22 %

## 2017-08-14 ENCOUNTER — Ambulatory Visit (INDEPENDENT_AMBULATORY_CARE_PROVIDER_SITE_OTHER): Payer: Medicare Other

## 2017-08-14 DIAGNOSIS — I5032 Chronic diastolic (congestive) heart failure: Secondary | ICD-10-CM | POA: Diagnosis not present

## 2017-08-14 DIAGNOSIS — Z95810 Presence of automatic (implantable) cardiac defibrillator: Secondary | ICD-10-CM | POA: Diagnosis not present

## 2017-08-14 NOTE — Progress Notes (Signed)
EPIC Encounter for ICM Monitoring  Patient Name: [name] is a 84 y.o. female Date: 08/14/2017 Primary Care Physican: Deloria Lair., MD Primary Cardiologist: Bronson Ing Electrophysiologist: Allred Dry Weight:163lbs  Bi-V Pacing:97.5%       Heart Failure questions reviewed, pt report arthritis symptoms.   She reported some shortness of breath.  She has not been able to take Metolazone because she is weak and has difficulty getting to the bathroom.    Thoracic impedance abnormal suggesting fluid accumulation.  Prescribed dosage: Torsemide 20 mg 2 tablets (40 mg total) twice a day. Metolazone 2.5 mg one tab by mouth on Monday &Friday. She takes Metolazone PRN instead of twice a week.  Recommendations:   Encouraged to call for fluid symptoms.  Follow-up plan: ICM clinic phone appointment on 08/21/2017  (manual send).  Office appointment scheduled 09/15/2017 with Dr. Rayann Heman.  Copy of ICM check sent to Dr. Rayann Heman and Dr. Bronson Ing.   3 month ICM trend: 08/14/2017    1 Year ICM trend:       Rosalene Billings, RN 08/14/2017 4:39 PM

## 2017-08-21 ENCOUNTER — Telehealth: Payer: Self-pay | Admitting: Cardiology

## 2017-08-21 ENCOUNTER — Ambulatory Visit (INDEPENDENT_AMBULATORY_CARE_PROVIDER_SITE_OTHER): Payer: Self-pay

## 2017-08-21 DIAGNOSIS — Z95810 Presence of automatic (implantable) cardiac defibrillator: Secondary | ICD-10-CM

## 2017-08-21 DIAGNOSIS — I5032 Chronic diastolic (congestive) heart failure: Secondary | ICD-10-CM

## 2017-08-21 NOTE — Telephone Encounter (Signed)
LMOVM reminding pt to send remote transmission.   

## 2017-08-22 NOTE — Progress Notes (Signed)
EPIC Encounter for ICM Monitoring  Patient Name: [name] is a 84 y.o. female Date: 08/22/2017 Primary Care Physican: Deloria Lair., MD Primary Cardiologist: Bronson Ing Electrophysiologist: Allred Dry Weight:162lbs  Bi-V Pacing:97.5%      Heart Failure questions reviewed, pt reported she was in the hospital for 3 days last week.  She said it was something with her stomach but wasn't sure what it was.  They also found a broken back vertebrae.  She has follow up appointment with PCP.     Thoracic impedance abnormal suggesting fluid accumulation since 07/03/2017.  Prescribed dosage: Torsemide 20 mg 2 tablets (40 mg total) twice a day. Metolazone 2.5 mg one tab by mouth on Monday &Friday. She takes Metolazone PRN instead of twice a week.  Recommendations:  Advised if she develops fluid symptoms to take the Metolazone that is prescribed. Encouraged to call for fluid symptoms.  Follow-up plan: ICM clinic phone appointment on 09/04/2017.  Office appointment scheduled 09/15/2017 with Dr. Rayann Heman.  Copy of ICM check sent to Dr. Rayann Heman and Dr. Bronson Ing.   3 month ICM trend: 08/22/2017    1 Year ICM trend:       Rosalene Billings, RN 08/22/2017 9:51 AM

## 2017-08-25 ENCOUNTER — Encounter: Payer: Medicare Other | Admitting: Internal Medicine

## 2017-09-04 ENCOUNTER — Ambulatory Visit (INDEPENDENT_AMBULATORY_CARE_PROVIDER_SITE_OTHER): Payer: Self-pay

## 2017-09-04 DIAGNOSIS — Z95810 Presence of automatic (implantable) cardiac defibrillator: Secondary | ICD-10-CM

## 2017-09-04 DIAGNOSIS — I5032 Chronic diastolic (congestive) heart failure: Secondary | ICD-10-CM

## 2017-09-04 NOTE — Progress Notes (Signed)
EPIC Encounter for ICM Monitoring  Patient Name: [name] is a 84 y.o. female Date: 09/04/2017 Primary Care Physican: Deloria Lair., MD Primary Cardiologist: Bronson Ing Electrophysiologist: Allred Dry Weight:158lbs  Bi-V Pacing:97.4%           Heart Failure questions reviewed, pt having back pain and concerned that physician will not continue to give her pain medication.    Thoracic impedance normal but is above baseline suggesting dryness.  Encouraged to drink extra fluids.   Prescribed dosage: Torsemide 20 mg 2 tablets (40 mg total) twice a day. Metolazone 2.5 mg one tab by mouth on Monday &Friday. She takes Metolazone PRN instead of twice a week.  Recommendations: No changes.  Advised to limit salt intake to 2000 mg/day and fluid intake to < 2 liters/day.  Encouraged to call for fluid symptoms.  Follow-up plan: ICM clinic phone appointment on 10/19/2017.  Office appointment scheduled 09/15/2017 with Dr. Rayann Heman.  Copy of ICM check sent to Dr. Rayann Heman.   3 month ICM trend: 09/04/2017    1 Year ICM trend:       Rosalene Billings, RN 09/04/2017 11:42 AM

## 2017-09-11 ENCOUNTER — Ambulatory Visit: Payer: Medicare Other | Admitting: Cardiovascular Disease

## 2017-09-11 ENCOUNTER — Encounter: Payer: Self-pay | Admitting: Cardiovascular Disease

## 2017-09-11 VITALS — BP 118/54 | HR 73 | Ht 65.0 in | Wt 168.0 lb

## 2017-09-11 DIAGNOSIS — Z95810 Presence of automatic (implantable) cardiac defibrillator: Secondary | ICD-10-CM

## 2017-09-11 DIAGNOSIS — I25118 Atherosclerotic heart disease of native coronary artery with other forms of angina pectoris: Secondary | ICD-10-CM | POA: Diagnosis not present

## 2017-09-11 DIAGNOSIS — E782 Mixed hyperlipidemia: Secondary | ICD-10-CM | POA: Diagnosis not present

## 2017-09-11 DIAGNOSIS — I5042 Chronic combined systolic (congestive) and diastolic (congestive) heart failure: Secondary | ICD-10-CM

## 2017-09-11 DIAGNOSIS — R531 Weakness: Secondary | ICD-10-CM

## 2017-09-11 NOTE — Progress Notes (Signed)
SUBJECTIVE: The patient presents for follow-up of chronic combined systolic and diastolic heart failure and coronary artery disease.  Thoracic impedance was normal but was above baseline suggesting hypovolemia.  This was assessed on 09/04/17.  I reviewed records.  Nuclear stress test on 06/10/15 demonstrated extensive prior myocardial scar in the anterior, lateral, and inferior walls, with very minimal peri-infarct ischemia in the anterior wall.  I have made numerous attempts to get her evaluated at the advanced heart failure clinic in [place] but she has consistently refused in spite of being offered transportation by her great niece and other relatives.  Echocardiogram 03/01/17 showed mildly reduced left ventricular systolic function, LVEF 14%, mild LVH, severe hypokinesis of the inferior and inferoseptal myocardium, grade 1 diastolic dysfunction, and mild mitral and tricuspid regurgitation.  She told me "everything is going downhill ".  She has a few tablets of Dilaudid left and said she is in severe diffuse pain and is scheduled to see her PCP later this week.  She was hospitalized at [facility] last month for 3 or 4 days.  I do not have any records.  She told me she had shortness of breath but does not know what they ultimately diagnosed her with nor how they treated her.  She said she was told she has cirrhosis and is scheduled to see GI in 2 days.  She said she weighs 161 pounds at home.    Review of Systems: As per "subjective", otherwise negative.  Allergies  Allergen Reactions  . Augmentin [Amoxicillin-Pot Clavulanate]     Abdominal pain  . Metformin     Upset stomach  . Metolazone     "made everything hurt" 07/03/15  . Sulfonamide Derivatives     swelling    Current Outpatient Medications  Medication Sig Dispense Refill  . allopurinol (ZYLOPRIM) 100 MG tablet Take 100 mg by mouth daily.    Marland Kitchen ALPRAZolam (XANAX) 0.5 MG tablet Take 0.5 mg by mouth at bedtime as  needed for anxiety.    . Ascorbic Acid (VITAMIN C) 100 MG tablet Take 100 mg by mouth daily.    Marland Kitchen aspirin 81 MG chewable tablet Chew 1 tablet (81 mg total) by mouth daily.    . carvedilol (COREG) 3.125 MG tablet Take 1 tablet (3.125 mg total) by mouth 2 (two) times daily with a meal. 180 tablet 3  . citalopram (CELEXA) 20 MG tablet Take 20 mg by mouth daily.    . cyanocobalamin 2000 MCG tablet Take 2,000 mcg by mouth daily.    . ergocalciferol (VITAMIN D2) 50000 units capsule Take 50,000 Units by mouth 2 (two) times a week.    Marland Kitchen HYDROmorphone (DILAUDID) 4 MG tablet Take 6 mg by mouth every 6 (six) hours as needed for severe pain.     Marland Kitchen insulin glargine (LANTUS) 100 UNIT/ML injection Inject 40 Units into the skin 2 (two) times daily.     . metolazone (ZAROXOLYN) 2.5 MG tablet Take 1 tablet (2.5 mg total) by mouth 2 (two) times a week. MONDAYS AND FRIDAYS 8 tablet 1  . Nebulizer MISC Inhale into the lungs.    . nitroGLYCERIN (NITROSTAT) 0.4 MG SL tablet Place 1 tablet (0.4 mg total) under the tongue every 5 (five) minutes as needed for chest pain. 25 tablet 3  . ondansetron (ZOFRAN) 4 MG tablet Take 4 mg by mouth every 8 (eight) hours as needed for nausea or vomiting.    . OXYGEN-HELIUM IN 2 L as needed.    Marland Kitchen  promethazine (PHENERGAN) 25 MG tablet Take 25 mg by mouth every 6 (six) hours as needed for nausea or vomiting.    . ranitidine (ZANTAC) 150 MG tablet Take 150 mg by mouth 2 (two) times daily.     Marland Kitchen torsemide (DEMADEX) 20 MG tablet Take 2 tablets (40 mg total) by mouth 2 (two) times daily. (Patient taking differently: Take 20 mg by mouth daily. ) 120 tablet 6   No current facility-administered medications for this visit.     Past Medical History:  Diagnosis Date  . Aneurysm ([place])    Apical; previously on Coumadin  . Anxiety disorder   . Cancer (HCC)    Skin  . Chronic renal insufficiency    Creatinine 1.17 January 2010  . Coronary artery disease    Cath 02/15/12 revealed RCA as culprit  lesion w/ occlusion at site of prior stent, patent LAD stent, chronic LCx and Diagonal dz; Medical therapy   . Depression   . GERD (gastroesophageal reflux disease)   . Hyperlipidemia    H/o GI intolerance to Lipitor  . Impingement syndrome of left shoulder   . Ischemic cardiomyopathy    s/p Medtronic BiV ICD 2006  . Peripheral polyneuropathy    Refractory to several medications  . Systolic and diastolic CHF, chronic (HCC)    echo 03/2012 EF 19-37%, mod diastolic dysfunction, mod MR, mod LAE, mod-severe TR, PASP 33mmHg  . Type 2 diabetes mellitus ([place])     Past Surgical History:  Procedure Laterality Date  . ABDOMINAL HYSTERECTOMY    . CARDIAC DEFIBRILLATOR PLACEMENT  05/28/2009   Medtronic ICD placed by Dr. Caryl Comes secondary to ICM/CHF, [phone] Fidelis lead fracture with ICD storm 2010 requiring new RV lead placement  . CHOLECYSTECTOMY    . EYE SURGERY    . IMPLANTABLE CARDIOVERTER DEFIBRILLATOR GENERATOR CHANGE  09/26/13   BiV ICD generator change by Dr Rayann Heman - MDT Pershing Proud XT CRT-D  . IMPLANTABLE CARDIOVERTER DEFIBRILLATOR GENERATOR CHANGE N/A 09/26/2013   Procedure: IMPLANTABLE CARDIOVERTER DEFIBRILLATOR GENERATOR CHANGE;  Surgeon: Coralyn Mark, MD;  Location[facility] LAB;  Service: Cardiovascular;  Laterality: N/A;  . LEFT HEART CATHETERIZATION WITH CORONARY ANGIOGRAM N/A 02/15/2012   Procedure: LEFT HEART CATHETERIZATION WITH CORONARY ANGIOGRAM;  Surgeon: Peter M Martinique, MD;  Location: [facility] CATH LAB;  Service: Cardiovascular;  Laterality: N/A;  . RIGHT HEART CATHETERIZATION N/A 04/27/2012   Procedure: RIGHT HEART CATH;  Surgeon: Larey Dresser, MD;  Location: [facility] CATH LAB;  Service: Cardiovascular;  Laterality: N/A;    Social History   Socioeconomic History  . Marital status: Widowed    Spouse name: Not on file  . Number of children: Not on file  . Years of education: Not on file  . Highest education level: Not on file  Social Needs  . Financial resource strain: Not on file  . Food  insecurity - worry: Not on file  . Food insecurity - inability: Not on file  . Transportation needs - medical: Not on file  . Transportation needs - non-medical: Not on file  Occupational History  . Occupation: Retired    Fish farm manager: RETIRED  Tobacco Use  . Smoking status: Former Smoker    Packs/day: 0.80    Years: 30.00    Pack years: 24.00    Types: Cigarettes    Start date: 04/05/1957    Last attempt to quit: 06/27/2002    Years since quitting: 15.2  . Smokeless tobacco: Never Used  Substance and Sexual Activity  .  Alcohol use: No    Alcohol/week: 0.0 oz  . Drug use: No  . Sexual activity: Not Currently  Other Topics Concern  . Not on file  Social History Narrative  . Not on file     Vitals:   09/11/17 1524  BP: (!) 118/54  Pulse: 73  SpO2: 98%  Weight: 154 lb (69.9 kg)  Height: 5\' 5"  (1.651 m)    Wt Readings from Last 3 Encounters:  09/11/17 154 lb (69.9 kg)  01/24/17 168 lb (76.2 kg)  09/02/16 165 lb (74.8 kg)     PHYSICAL EXAM General: NAD, elderly, chronically ill-appearing HEENT: Normal. Neck: No JVD, no thyromegaly. Lungs: Clear to auscultation bilaterally with normal respiratory effort. CV: Regular rate and rhythm, normal S1/S2, no S3/S4, no murmur. No pretibial or periankle edema.   Abdomen: Soft, nontender, no distention.  Neurologic: Alert and oriented.  Psych: Normal affect. Skin: Normal. Musculoskeletal: No gross deformities.    ECG: Most recent ECG reviewed.   Labs:    Lipids: Lab Results  Component Value Date/Time   LDLCALC 68 06/06/2015 05:03 AM   CHOL 146 06/06/2015 05:03 AM   TRIG 289 (H) 06/06/2015 05:03 AM   HDL 20 (L) 06/06/2015 05:03 AM       ASSESSMENT AND PLAN:  1. CAD with ischemic cardiomyopathy: Stable ischemic heart disease with LVEF normalization. Continue ASA and Coreg. She has undergone prior stenting with her most recent coronary angiogram performed in August 2013 which showed left main 40%, LAD 50% ostial with  patent stent in prox LAD, D2 90%, LCX occluded in mid vessel with left to left collaterals, RCA occluded proximally at prior stent site.  2. Chronic combined systolic and diastolic heart failure:  Euvolemic and stable.  No changes to diuretic regimen which includes torsemide 20 mg daily (instructed to take 40 mg twice daily but routinely does not do so).  She has been instructed to take metolazone twice per week but normally does not do so.  3. Hyperlipidemia: Has not wanted to try alternative statin therapy.  4. CRT-D: Functioning normally. Followed by Dr. Rayann Heman.     Disposition: Follow up 6 months   Kate Sable, M.D., F.A.C.C.

## 2017-09-11 NOTE — Patient Instructions (Signed)

## 2017-09-12 ENCOUNTER — Telehealth: Payer: Self-pay | Admitting: Cardiovascular Disease

## 2017-09-12 NOTE — Telephone Encounter (Signed)
[name] called stating that she needs to discuss her weight that was documented on her chart from 09/11/2017.

## 2017-09-12 NOTE — Telephone Encounter (Signed)
Patient stated that her weight during OV was 168lbs.  Stated that she saw it on the scales herself.  Noticed that her paper had 154lbs from discharge during her OV.  Informed patient that the documentation will be changed accordingly.

## 2017-09-13 ENCOUNTER — Ambulatory Visit (INDEPENDENT_AMBULATORY_CARE_PROVIDER_SITE_OTHER): Payer: Medicare Other | Admitting: Internal Medicine

## 2017-09-15 ENCOUNTER — Telehealth: Payer: Self-pay | Admitting: Cardiovascular Disease

## 2017-09-15 ENCOUNTER — Encounter: Payer: Medicare Other | Admitting: Internal Medicine

## 2017-09-15 MED ORDER — CARVEDILOL 3.125 MG PO TABS: 3.1250 mg | ORAL_TABLET | Freq: Two times a day (BID) | ORAL | 3 refills | Status: AC

## 2017-09-15 NOTE — Telephone Encounter (Signed)
Rx sent 

## 2017-09-15 NOTE — Telephone Encounter (Signed)
Please ask [facility] to deliver medication to patient.

## 2017-09-15 NOTE — Telephone Encounter (Signed)
Patient called requesting to have a 90 day supply of carvedilol (COREG) 3.125 MG tablet   called to [place].

## 2017-09-21 ENCOUNTER — Encounter: Payer: Self-pay | Admitting: *Deleted

## 2017-09-25 ENCOUNTER — Encounter (INDEPENDENT_AMBULATORY_CARE_PROVIDER_SITE_OTHER): Payer: Self-pay | Admitting: Internal Medicine

## 2017-09-25 ENCOUNTER — Encounter (INDEPENDENT_AMBULATORY_CARE_PROVIDER_SITE_OTHER): Payer: Self-pay | Admitting: *Deleted

## 2017-09-25 ENCOUNTER — Ambulatory Visit (INDEPENDENT_AMBULATORY_CARE_PROVIDER_SITE_OTHER): Payer: Medicare Other | Admitting: Internal Medicine

## 2017-09-25 VITALS — BP 120/58 | HR 60 | Temp 97.3°F | Ht 65.0 in | Wt 166.7 lb

## 2017-09-25 DIAGNOSIS — K7460 Unspecified cirrhosis of liver: Secondary | ICD-10-CM

## 2017-09-25 NOTE — Patient Instructions (Signed)
US abdomen/labs. Further recommendations to follow.

## 2017-09-25 NOTE — Progress Notes (Addendum)
Subjective:    Patient ID: [name], female    DOB: [birth year], 84 y.o.   MRN: [id number] Maintained on 02 2 liters  HPI Referred by Dr. Lorra Hals for cirrhosis.Underwent a CT 08/16/2017 for abdominal pain/SOB which suggest hepatic cirrhosis with splenomegaly. Admitted to [facility] x 3 days in February.  She states this is a new diagnosis for her. She states her brother has cirrhosis. She denies any ascites.  Denies any periods of confusion. No prior hx of drug use or etoh abuse.  Hx of CHF, AICD, COPD, chronic respiratory failure, Atrial Fib, diabetes.  Hx of GERD. Seen at [facility] 08/15/2017 with SOB. Underwent a CT to rule out PE. CT revealed cirrhosis with splenomegaly.   08/16/2017 Ct abdomen/pelvis w/o: abdominal pain Appearance of the liver suggests underlying hepatic cirrhosis. No focal lesions are evident on this non contrast enhanced study. Splenomegaly without focal splenic lesions.  08/17/2017 total bili 1.5, ALT 14, ALP 80, AST 17.32 08/15/2017 total bili 2.6, ALP 86, AST 21.5, ALT 16.  08/16/2017 H and H 10.3 and 31.8, Platelet ct 130.   Has biventricular implantable cardioverter-defibrillator Has been a diabetic  10-36yrs.  Takes chronic narcotic pain for neuropathy in her legs.   Review of Systems Past Medical History:  Diagnosis Date  . Aneurysm ([place])    Apical; previously on Coumadin  . Anxiety disorder   . Cancer (HCC)    Skin  . Chronic renal insufficiency    Creatinine 1.17 January 2010  . Coronary artery disease    Cath 02/15/12 revealed RCA as culprit lesion w/ occlusion at site of prior stent, patent LAD stent, chronic LCx and Diagonal dz; Medical therapy   . Depression   . GERD (gastroesophageal reflux disease)   . Hyperlipidemia    H/o GI intolerance to Lipitor  . Impingement syndrome of left shoulder   . Ischemic cardiomyopathy    s/p Medtronic BiV ICD 2006  . Peripheral polyneuropathy    Refractory to several medications  . Systolic and  diastolic CHF, chronic (HCC)    echo 03/2012 EF 73-71%, mod diastolic dysfunction, mod MR, mod LAE, mod-severe TR, PASP 40mmHg  . Type 2 diabetes mellitus ([place])     Past Surgical History:  Procedure Laterality Date  . ABDOMINAL HYSTERECTOMY    . CARDIAC DEFIBRILLATOR PLACEMENT  05/28/2009   Medtronic ICD placed by Dr. Caryl Comes secondary to ICM/CHF, [phone] Fidelis lead fracture with ICD storm 2010 requiring new RV lead placement  . CHOLECYSTECTOMY    . EYE SURGERY    . IMPLANTABLE CARDIOVERTER DEFIBRILLATOR GENERATOR CHANGE  09/26/13   BiV ICD generator change by Dr Rayann Heman - MDT Pershing Proud XT CRT-D  . IMPLANTABLE CARDIOVERTER DEFIBRILLATOR GENERATOR CHANGE N/A 09/26/2013   Procedure: IMPLANTABLE CARDIOVERTER DEFIBRILLATOR GENERATOR CHANGE;  Surgeon: Coralyn Mark, MD;  Location: [facility] CATH LAB;  Service: Cardiovascular;  Laterality: N/A;  . LEFT HEART CATHETERIZATION WITH CORONARY ANGIOGRAM N/A 02/15/2012   Procedure: LEFT HEART CATHETERIZATION WITH CORONARY ANGIOGRAM;  Surgeon: Peter M Martinique, MD;  Location: [facility] CATH LAB;  Service: Cardiovascular;  Laterality: N/A;  . RIGHT HEART CATHETERIZATION N/A 04/27/2012   Procedure: RIGHT HEART CATH;  Surgeon: Larey Dresser, MD;  Location: [facility] CATH LAB;  Service: Cardiovascular;  Laterality: N/A;    Allergies  Allergen Reactions  . Augmentin [Amoxicillin-Pot Clavulanate]     Abdominal pain  . Metformin     Upset stomach  . Metolazone     "made everything hurt" 07/03/15  .  Sulfonamide Derivatives     swelling    Current Outpatient Medications on File Prior to Visit  Medication Sig Dispense Refill  . albuterol (ACCUNEB) 0.63 MG/3ML nebulizer solution Take 1 ampule by nebulization every 6 (six) hours as needed for wheezing.    Marland Kitchen allopurinol (ZYLOPRIM) 100 MG tablet Take 100 mg by mouth daily.    Marland Kitchen ALPRAZolam (XANAX) 0.5 MG tablet Take 0.5 mg by mouth at bedtime as needed for anxiety.    . Ascorbic Acid (VITAMIN C) 100 MG tablet Take 100 mg by mouth daily.    .  Ascorbic Acid (VITAMIN C) 100 MG tablet Take 100 mg by mouth daily.    Marland Kitchen aspirin 81 MG chewable tablet Chew 1 tablet (81 mg total) by mouth daily.    . carvedilol (COREG) 3.125 MG tablet Take 1 tablet (3.125 mg total) by mouth 2 (two) times daily with a meal. 180 tablet 3  . citalopram (CELEXA) 20 MG tablet Take 20 mg by mouth daily.    . colchicine 0.6 MG tablet Take 0.6 mg by mouth as needed.    . cyanocobalamin 2000 MCG tablet Take 2,000 mcg by mouth daily.    . cyanocobalamin 2000 MCG tablet Take 2,000 mcg by mouth daily.    . ergocalciferol (VITAMIN D2) 50000 units capsule Take 50,000 Units by mouth 2 (two) times a week.    Marland Kitchen HYDROmorphone (DILAUDID) 4 MG tablet Take 6 mg by mouth every 6 (six) hours as needed for severe pain.     Marland Kitchen insulin glargine (LANTUS) 100 UNIT/ML injection Inject 40 Units into the skin 2 (two) times daily.     . metolazone (ZAROXOLYN) 2.5 MG tablet Take 1 tablet (2.5 mg total) by mouth 2 (two) times a week. MONDAYS AND FRIDAYS 8 tablet 1  . Nebulizer MISC Inhale into the lungs.    . nitroGLYCERIN (NITROSTAT) 0.4 MG SL tablet Place 1 tablet (0.4 mg total) under the tongue every 5 (five) minutes as needed for chest pain. 25 tablet 3  . OXYGEN-HELIUM IN 2 L as needed.    . promethazine (PHENERGAN) 25 MG tablet Take 25 mg by mouth every 6 (six) hours as needed for nausea or vomiting.    . ranitidine (ZANTAC) 150 MG tablet Take 150 mg by mouth 2 (two) times daily.     Marland Kitchen torsemide (DEMADEX) 20 MG tablet Take 2 tablets (40 mg total) by mouth 2 (two) times daily. (Patient taking differently: Take 20 mg by mouth daily. ) 120 tablet 6   No current facility-administered medications on file prior to visit.         Objective:   Physical Exam Blood pressure (!) 120/58, pulse 60, temperature (!) 97.3 F (36.3 C), height 5\' 5"  (1.651 m), weight 166 lb 11.2 oz (75.6 kg).  Alert and oriented. Skin warm and dry. Oral mucosa is moist.   . Sclera anicteric, conjunctivae is pink.  Thyroid not enlarged. No cervical lymphadenopathy. Lungs clear. Heart regular rate and rhythm.  Abdomen is soft. Bowel sounds are positive. No hepatomegaly. No abdominal masses felt. No tenderness.  No edema to lower extremities.         Assessment & Plan:  NAFLD/cirrhosis. Will get an US abdomen, CBC, Hepatic, PT/INR, ANA, SMA, Ceruloplasmin, alpha 1 antitrypsin Acute Hepatitis Panel.

## 2017-09-26 ENCOUNTER — Encounter: Payer: Self-pay | Admitting: Cardiovascular Disease

## 2017-09-28 LAB — HEPATITIS PANEL, ACUTE
HEP B S AG: NONREACTIVE
Hep A IgM: NONREACTIVE
Hep B C IgM: NONREACTIVE
Hepatitis C Ab: NONREACTIVE
SIGNAL TO CUT-OFF: 0.05 (ref <1.00)

## 2017-09-28 LAB — CBC WITH DIFFERENTIAL/PLATELET
Basophils Absolute: 32 cells/uL (ref 0–200)
Basophils Relative: 0.5 %
EOS ABS: 309 {cells}/uL (ref 15–500)
Eosinophils Relative: 4.9 %
HEMATOCRIT: 28.5 % — AB (ref 35.0–45.0)
HEMOGLOBIN: 9.6 g/dL — AB (ref 11.7–15.5)
LYMPHS ABS: 498 {cells}/uL — AB (ref 850–3900)
MCH: 27.5 pg (ref 27.0–33.0)
MCHC: 33.7 g/dL (ref 32.0–36.0)
MCV: 81.7 fL (ref 80.0–100.0)
MPV: 9.8 fL (ref 7.5–12.5)
Monocytes Relative: 7.3 %
Neutro Abs: 5002 cells/uL (ref 1500–7800)
Neutrophils Relative %: 79.4 %
Platelets: 238 10*3/uL (ref 140–400)
RBC: 3.49 10*6/uL — AB (ref 3.80–5.10)
RDW: 14.2 % (ref 11.0–15.0)
TOTAL LYMPHOCYTE: 7.9 %
WBC: 6.3 10*3/uL (ref 3.8–10.8)
WBCMIX: 460 {cells}/uL (ref 200–950)

## 2017-09-28 LAB — HEPATIC FUNCTION PANEL
AG RATIO: 1.1 (calc) (ref 1.0–2.5)
ALBUMIN MSPROF: 3.5 g/dL — AB (ref 3.6–5.1)
ALT: 14 U/L (ref 6–29)
AST: 20 U/L (ref 10–35)
Alkaline phosphatase (APISO): 102 U/L (ref 33–130)
BILIRUBIN INDIRECT: 1 mg/dL (ref 0.2–1.2)
BILIRUBIN TOTAL: 1.4 mg/dL — AB (ref 0.2–1.2)
Bilirubin, Direct: 0.4 mg/dL — ABNORMAL HIGH (ref 0.0–0.2)
GLOBULIN: 3.2 g/dL (ref 1.9–3.7)
Total Protein: 6.7 g/dL (ref 6.1–8.1)

## 2017-09-28 LAB — PROTIME-INR
INR: 1.1
Prothrombin Time: 11.2 s (ref 9.0–11.5)

## 2017-09-28 LAB — ANA: Anti Nuclear Antibody(ANA): NEGATIVE

## 2017-09-28 LAB — ANTI-SMOOTH MUSCLE ANTIBODY, IGG: Actin (Smooth Muscle) Antibody (IGG): <20 U (ref <20)

## 2017-09-28 LAB — ALPHA-1-ANTITRYPSIN: A-1 Antitrypsin, Ser: 280 mg/dL — ABNORMAL HIGH (ref 83–199)

## 2017-09-28 LAB — SEDIMENTATION RATE: Sed Rate: 63 mm/h — ABNORMAL HIGH (ref 0–30)

## 2017-09-28 LAB — AMMONIA: Ammonia: 51 umol/L (ref <=72)

## 2017-09-28 LAB — CERULOPLASMIN: Ceruloplasmin: 43 mg/dL (ref 18–53)

## 2017-09-29 ENCOUNTER — Ambulatory Visit (HOSPITAL_COMMUNITY)
Admission: RE | Admit: 2017-09-29 | Discharge: 2017-09-29 | Disposition: A | Discharge status: Home / Self Care | Payer: Medicare Other | Source: Ambulatory Visit | Attending: Internal Medicine | Admitting: Internal Medicine

## 2017-09-29 DIAGNOSIS — Z9049 Acquired absence of other specified parts of digestive tract: Secondary | ICD-10-CM | POA: Diagnosis not present

## 2017-09-29 DIAGNOSIS — K7460 Unspecified cirrhosis of liver: Secondary | ICD-10-CM | POA: Diagnosis not present

## 2017-10-09 ENCOUNTER — Telehealth (INDEPENDENT_AMBULATORY_CARE_PROVIDER_SITE_OTHER): Payer: Self-pay | Admitting: Internal Medicine

## 2017-10-09 DIAGNOSIS — K921 Melena: Secondary | ICD-10-CM

## 2017-10-09 NOTE — Telephone Encounter (Signed)
CBC ordered 

## 2017-10-11 ENCOUNTER — Telehealth (INDEPENDENT_AMBULATORY_CARE_PROVIDER_SITE_OTHER): Payer: Self-pay | Admitting: Internal Medicine

## 2017-10-11 NOTE — Telephone Encounter (Signed)
Needs OV in a Tuesday in 8 or 9 weeks when Dr. Laural Golden is in office.

## 2017-10-11 NOTE — Telephone Encounter (Signed)
Patient is scheduled - appt reminder mailed

## 2017-10-19 ENCOUNTER — Encounter: Payer: Medicare Other | Admitting: *Deleted

## 2017-10-20 ENCOUNTER — Encounter: Payer: Self-pay | Admitting: Cardiology

## 2017-10-27 ENCOUNTER — Encounter: Payer: Medicare Other | Admitting: Internal Medicine

## 2017-10-27 ENCOUNTER — Encounter: Payer: Self-pay | Admitting: Internal Medicine

## 2017-10-27 NOTE — Progress Notes (Signed)
No ICM remote transmission received for 10/19/2017 and next ICM transmission scheduled for 11/16/2017.

## 2017-10-31 ENCOUNTER — Telehealth (INDEPENDENT_AMBULATORY_CARE_PROVIDER_SITE_OTHER): Payer: Self-pay | Admitting: Internal Medicine

## 2017-10-31 NOTE — Telephone Encounter (Signed)
Patient called stating she has been in the hospital and is just returning your call

## 2017-11-01 NOTE — Telephone Encounter (Signed)
She was scheduled on the day Terri went home sick few weeks ago.  She just needs to be rescheduled.  Look and make sure she didn't come in since then.  If not, she just needs an appointment.

## 2017-11-05 ENCOUNTER — Encounter: Payer: Self-pay | Admitting: Cardiovascular Disease

## 2017-11-06 ENCOUNTER — Encounter (HOSPITAL_COMMUNITY): Payer: Self-pay | Admitting: Emergency Medicine

## 2017-11-06 ENCOUNTER — Emergency Department (HOSPITAL_COMMUNITY)
Admission: EM | Admit: 2017-11-06 | Discharge: 2017-11-06 | Disposition: A | Discharge status: Home / Self Care | Payer: Medicare Other | Attending: Emergency Medicine | Admitting: Emergency Medicine

## 2017-11-06 ENCOUNTER — Emergency Department (HOSPITAL_COMMUNITY): Payer: Medicare Other

## 2017-11-06 ENCOUNTER — Encounter: Payer: Self-pay | Admitting: *Deleted

## 2017-11-06 ENCOUNTER — Telehealth: Payer: Self-pay | Admitting: Cardiovascular Disease

## 2017-11-06 DIAGNOSIS — Z79899 Other long term (current) drug therapy: Secondary | ICD-10-CM | POA: Insufficient documentation

## 2017-11-06 DIAGNOSIS — I255 Ischemic cardiomyopathy: Secondary | ICD-10-CM | POA: Insufficient documentation

## 2017-11-06 DIAGNOSIS — R0600 Dyspnea, unspecified: Secondary | ICD-10-CM | POA: Insufficient documentation

## 2017-11-06 DIAGNOSIS — R531 Weakness: Secondary | ICD-10-CM | POA: Diagnosis not present

## 2017-11-06 DIAGNOSIS — I5042 Chronic combined systolic (congestive) and diastolic (congestive) heart failure: Secondary | ICD-10-CM | POA: Insufficient documentation

## 2017-11-06 DIAGNOSIS — M79606 Pain in leg, unspecified: Secondary | ICD-10-CM | POA: Diagnosis present

## 2017-11-06 DIAGNOSIS — E1165 Type 2 diabetes mellitus with hyperglycemia: Secondary | ICD-10-CM | POA: Insufficient documentation

## 2017-11-06 DIAGNOSIS — Z794 Long term (current) use of insulin: Secondary | ICD-10-CM | POA: Insufficient documentation

## 2017-11-06 DIAGNOSIS — E785 Hyperlipidemia, unspecified: Secondary | ICD-10-CM | POA: Insufficient documentation

## 2017-11-06 DIAGNOSIS — Z7982 Long term (current) use of aspirin: Secondary | ICD-10-CM | POA: Diagnosis not present

## 2017-11-06 DIAGNOSIS — D649 Anemia, unspecified: Secondary | ICD-10-CM | POA: Diagnosis not present

## 2017-11-06 DIAGNOSIS — N183 Chronic kidney disease, stage 3 (moderate): Secondary | ICD-10-CM | POA: Diagnosis not present

## 2017-11-06 DIAGNOSIS — I2510 Atherosclerotic heart disease of native coronary artery without angina pectoris: Secondary | ICD-10-CM | POA: Insufficient documentation

## 2017-11-06 DIAGNOSIS — Z87891 Personal history of nicotine dependence: Secondary | ICD-10-CM | POA: Diagnosis not present

## 2017-11-06 LAB — URINALYSIS, ROUTINE W REFLEX MICROSCOPIC
BILIRUBIN URINE: NEGATIVE
Bacteria, UA: NONE SEEN
Glucose, UA: NEGATIVE mg/dL
HGB URINE DIPSTICK: NEGATIVE
KETONES UR: NEGATIVE mg/dL
LEUKOCYTES UA: NEGATIVE
NITRITE: NEGATIVE
PROTEIN: 30 mg/dL — AB
Specific Gravity, Urine: 1.006 (ref 1.005–1.030)
pH: 7 (ref 5.0–8.0)

## 2017-11-06 LAB — BASIC METABOLIC PANEL
ANION GAP: 10 (ref 5–15)
BUN: 34 mg/dL — ABNORMAL HIGH (ref 6–20)
CALCIUM: 9.5 mg/dL (ref 8.9–10.3)
CO2: 34 mmol/L — ABNORMAL HIGH (ref 22–32)
Chloride: 90 mmol/L — ABNORMAL LOW (ref 101–111)
Creatinine, Ser: 1.88 mg/dL — ABNORMAL HIGH (ref 0.44–1.00)
GFR calc non Af Amer: 24 mL/min — ABNORMAL LOW (ref >60)
GFR, EST AFRICAN AMERICAN: 28 mL/min — AB (ref >60)
GLUCOSE: 216 mg/dL — AB (ref 65–99)
POTASSIUM: 4.6 mmol/L (ref 3.5–5.1)
Sodium: 134 mmol/L — ABNORMAL LOW (ref 135–145)

## 2017-11-06 LAB — CBC
HEMATOCRIT: 27.9 % — AB (ref 36.0–46.0)
HEMOGLOBIN: 8.4 g/dL — AB (ref 12.0–15.0)
MCH: 27.5 pg (ref 26.0–34.0)
MCHC: 30.1 g/dL (ref 30.0–36.0)
MCV: 91.2 fL (ref 78.0–100.0)
Platelets: 180 10*3/uL (ref 150–400)
RBC: 3.06 MIL/uL — AB (ref 3.87–5.11)
RDW: 17.3 % — ABNORMAL HIGH (ref 11.5–15.5)
WBC: 6.4 10*3/uL (ref 4.0–10.5)

## 2017-11-06 LAB — TROPONIN I: TROPONIN I: 0.08 ng/mL — AB (ref <0.03)

## 2017-11-06 NOTE — ED Notes (Signed)
Pt undressed, EDP at the bedside to assess. Pt denies CP, just pain in feet, Came because she was admitted at [facility] and did not want to change cardiologist.

## 2017-11-06 NOTE — ED Notes (Signed)
Patient given discharge instruction, verbalized understand. Patient wheelchair out of the department.  

## 2017-11-06 NOTE — ED Provider Notes (Signed)
[facility] EMERGENCY DEPARTMENT Provider Note   CSN: [id number] Arrival date & time: 11/06/17  1323     History   Chief Complaint Chief Complaint  Patient presents with  . Multiple Complaints    HPI [name] is a 84 y.o. female.  Complains of bilateral leg and foot pain for several months, feels like her neuropathy she also complains of difficulty walking due to leg and foot pain, she also reports progressively worsening shortness of breath for the past 4 weeks other associated symptoms include generalized weakness.  She denies chest pain denies fever denies cough denies abdominal pain denies headache denies urinary symptoms.  She went to [facility] emergency department yesterday but decided to leave the ED.  She was told that her troponin was elevated.  Nothing makes symptoms better or worse.  She reports that she has been on Dilaudid for 2 years for chronic pain.  Her Dilaudid dose was decreased approximately 4 weeks ago.  No other treatment prior to coming here  HPI  Past Medical History:  Diagnosis Date  . Aneurysm ([place])    Apical; previously on Coumadin  . Anxiety disorder   . Cancer (HCC)    Skin  . Chronic renal insufficiency    Creatinine 1.17 January 2010  . Coronary artery disease    Cath 02/15/12 revealed RCA as culprit lesion w/ occlusion at site of prior stent, patent LAD stent, chronic LCx and Diagonal dz; Medical therapy   . Depression   . GERD (gastroesophageal reflux disease)   . Hyperlipidemia    H/o GI intolerance to Lipitor  . Impingement syndrome of left shoulder   . Ischemic cardiomyopathy    s/p Medtronic BiV ICD 2006  . Peripheral polyneuropathy    Refractory to several medications  . Systolic and diastolic CHF, chronic (HCC)    echo 03/2012 EF 75-91%, mod diastolic dysfunction, mod MR, mod LAE, mod-severe TR, PASP 43mmHg  . Type 2 diabetes mellitus [facility])     Patient Active Problem List   Diagnosis Date Noted  . Elevated  troponin   . Chronic pain syndrome 06/05/2015  . Thrombocytopenia ([place]) 06/05/2015  . Weakness generalized 06/04/2015  . Elevated troponin I level 06/04/2015  . Automatic implantable cardiac defibrillator in situ 07/22/2013  . Acute on chronic systolic heart failure ([place]) 11/24/2012  . Acute on chronic renal failure ([place]) 11/24/2012  . Hypoxemia 04/02/2012  . Stage III chronic kidney disease ([place]) 04/02/2012  . Statin intolerance   . Biventricular implantable cardioverter-defibrillator in situ   . Coronary artery disease   . Ischemic cardiomyopathy   . Diabetes mellitus with neuropathy ([place]) 02/14/2011  . Coronary atherosclerosis of native coronary artery 07/03/2009  . HLD (hyperlipidemia) 04/12/2009  . SYSTOLIC HEART FAILURE, CHRONIC 04/12/2009    Past Surgical History:  Procedure Laterality Date  . ABDOMINAL HYSTERECTOMY    . CARDIAC DEFIBRILLATOR PLACEMENT  05/28/2009   Medtronic ICD placed by Dr. Caryl Comes secondary to ICM/CHF, [phone] [place] lead fracture with ICD storm 2010 requiring new RV lead placement  . CHOLECYSTECTOMY    . EYE SURGERY    . IMPLANTABLE CARDIOVERTER DEFIBRILLATOR GENERATOR CHANGE  09/26/13   BiV ICD generator change by Dr Rayann Heman - MDT [place] XT CRT-D  . IMPLANTABLE CARDIOVERTER DEFIBRILLATOR GENERATOR CHANGE N/A 09/26/2013   Procedure: IMPLANTABLE CARDIOVERTER DEFIBRILLATOR GENERATOR CHANGE;  Surgeon: Coralyn Mark, MD;  Location: [facility] CATH LAB;  Service: Cardiovascular;  Laterality: N/A;  . LEFT HEART CATHETERIZATION WITH CORONARY ANGIOGRAM N/A 02/15/2012  Procedure: LEFT HEART CATHETERIZATION WITH CORONARY ANGIOGRAM;  Surgeon: Peter M Martinique, MD;  Location: [facility] CATH LAB;  Service: Cardiovascular;  Laterality: N/A;  . RIGHT HEART CATHETERIZATION N/A 04/27/2012   Procedure: RIGHT HEART CATH;  Surgeon: Larey Dresser, MD;  Location: [facility] CATH LAB;  Service: Cardiovascular;  Laterality: N/A;     OB History   None      Home Medications    Prior to Admission  medications   Medication Sig Start Date End Date Taking? Authorizing Provider  albuterol (ACCUNEB) 0.63 MG/3ML nebulizer solution Take 1 ampule by nebulization every 6 (six) hours as needed for wheezing.    [provider]  allopurinol (ZYLOPRIM) 100 MG tablet Take 100 mg by mouth daily.    [provider]  ALPRAZolam Duanne Moron) 0.5 MG tablet Take 0.5 mg by mouth at bedtime as needed for anxiety.    [provider]  Ascorbic Acid (VITAMIN C) 100 MG tablet Take 100 mg by mouth daily.    [provider]  Ascorbic Acid (VITAMIN C) 100 MG tablet Take 100 mg by mouth daily.    [provider]  aspirin 81 MG chewable tablet Chew 1 tablet (81 mg total) by mouth daily. 06/06/15   Rexene Alberts, MD  carvedilol (COREG) 3.125 MG tablet Take 1 tablet (3.125 mg total) by mouth 2 (two) times daily with a meal. 09/15/17   Herminio Commons, MD  citalopram (CELEXA) 20 MG tablet Take 20 mg by mouth daily.    [provider]  colchicine 0.6 MG tablet Take 0.6 mg by mouth as needed.    [provider]  cyanocobalamin 2000 MCG tablet Take 2,000 mcg by mouth daily.    [provider]  cyanocobalamin 2000 MCG tablet Take 2,000 mcg by mouth daily.    [provider]  ergocalciferol (VITAMIN D2) 50000 units capsule Take 50,000 Units by mouth 2 (two) times a week.    [provider]  HYDROmorphone (DILAUDID) 4 MG tablet Take 6 mg by mouth every 6 (six) hours as needed for severe pain.     [provider]  insulin glargine (LANTUS) 100 UNIT/ML injection Inject 40 Units into the skin 2 (two) times daily.  02/20/12   Hope, Jessica A, PA-C  metolazone (ZAROXOLYN) 2.5 MG tablet Take 1 tablet (2.5 mg total) by mouth 2 (two) times a week. MONDAYS AND FRIDAYS 04/17/17   Herminio Commons, MD  Nebulizer MISC Inhale into the lungs.    [provider]  nitroGLYCERIN (NITROSTAT) 0.4 MG SL tablet Place 1 tablet (0.4 mg total)  under the tongue every 5 (five) minutes as needed for chest pain. 06/14/13   Minus Breeding, MD  OXYGEN-HELIUM IN 2 L as needed.    [provider]  promethazine (PHENERGAN) 25 MG tablet Take 25 mg by mouth every 6 (six) hours as needed for nausea or vomiting.    [provider]  ranitidine (ZANTAC) 150 MG tablet Take 150 mg by mouth 2 (two) times daily.     [provider]  torsemide (DEMADEX) 20 MG tablet Take 2 tablets (40 mg total) by mouth 2 (two) times daily. Patient taking differently: Take 20 mg by mouth daily.  09/02/16   Thompson Grayer, MD    Family History Family History  Problem Relation Age of Onset  . Heart attack Father   . Heart disease Sister   . Heart disease Brother   . Coronary artery disease Neg Hx  Social History Social History   Tobacco Use  . Smoking status: Former Smoker    Packs/day: 0.80    Years: 30.00    Pack years: 24.00    Types: Cigarettes    Start date: 04/05/1957    Last attempt to quit: 06/27/2002    Years since quitting: 15.3  . Smokeless tobacco: Never Used  Substance Use Topics  . Alcohol use: No    Alcohol/week: 0.0 oz  . Drug use: No     Allergies   Augmentin [amoxicillin-pot clavulanate]; Metformin; Metolazone; and Sulfonamide derivatives   Review of Systems Review of Systems  Constitutional: Negative.   HENT: Negative.   Respiratory: Positive for shortness of breath.        Chronic dyspnea.  Oxygen dependent 2 pillow orthopnea, chronic for several years  Cardiovascular: Negative.   Gastrointestinal: Negative.   Musculoskeletal: Negative.   Skin: Negative.   Allergic/Immunologic: Positive for immunocompromised state.       Diabetic  Neurological: Positive for weakness.       Diabetic neuropathy manifesting his bilateral leg and foot pain.  Generalized weakness  Psychiatric/Behavioral: Negative.   All other systems reviewed and are negative.    Physical Exam Updated Vital Signs BP 123/61  (BP Location: Left Arm)   Pulse 80   Temp 97.6 F (36.4 C) (Temporal)   Resp (!) 22   Ht 5\' 6"  (1.676 m)   Wt 73.9 kg (163 lb)   SpO2 98%   BMI 26.31 kg/m   Physical Exam  Constitutional: She appears well-developed and well-nourished. No distress.  HENT:  Head: Normocephalic and atraumatic.  Eyes: Pupils are equal, round, and reactive to light. Conjunctivae are normal.  Neck: Neck supple. No JVD present. No tracheal deviation present. No thyromegaly present.  Cardiovascular: Normal rate, regular rhythm, normal heart sounds and intact distal pulses.  No murmur heard. Pulmonary/Chest: Effort normal. No respiratory distress. She has rales.  Scant diffuse dry crackles  Abdominal: Soft. Bowel sounds are normal. She exhibits no distension. There is no tenderness.  Genitourinary: Rectal exam shows guaiac positive stool.  Genitourinary Comments: Rectum normal tone brown stool Hemoccult negative  Musculoskeletal: Normal range of motion. She exhibits no edema or tenderness.  DP pulses 2+ bilaterally femoral pulses 2+ bilaterally.  All 4 extremities without redness swelling or tenderness neurovascular intact  Neurological: She is alert. Coordination normal.  Skin: Skin is warm and dry. Capillary refill takes less than 2 seconds. No rash noted.  Psychiatric: She has a normal mood and affect.  Nursing note and vitals reviewed.    ED Treatments / Results  Labs (all labs ordered are listed, but only abnormal results are displayed) Labs Reviewed  BASIC METABOLIC PANEL - Abnormal; Notable for the following components:      Result Value   Sodium 134 (*)    Chloride 90 (*)    CO2 34 (*)    Glucose, Bld 216 (*)    BUN 34 (*)    Creatinine, Ser 1.88 (*)    GFR calc non Af Amer 24 (*)    GFR calc Af Amer 28 (*)    All other components within normal limits  CBC - Abnormal; Notable for the following components:   RBC 3.06 (*)    Hemoglobin 8.4 (*)    HCT 27.9 (*)    RDW 17.3 (*)    All  other components within normal limits  TROPONIN I - Abnormal; Notable for the following components:   Troponin  I 0.08 (*)    All other components within normal limits  URINALYSIS, ROUTINE W REFLEX MICROSCOPIC    EKG EKG Interpretation  Date/Time:  Monday Nov 06 2017 13:29:43 EDT Ventricular Rate:  81 PR Interval:  138 QRS Duration: 148 QT Interval:  448 QTC Calculation: 520 R Axis:   -28 Text Interpretation:  Atrial-sensed ventricular-paced rhythm Confirmed by Tanna Furry [phone]) on 12-10-202019 1:34:46 PM   Radiology Dg Chest 2 View  Result Date: 12-10-202019 CLINICAL DATA:  Shortness of breath EXAM: CHEST - 2 VIEW COMPARISON:  11/05/2017 FINDINGS: Cardiac shadow is enlarged. Defibrillator is again noted and stable. Mild vascular congestion is seen with very minimal interstitial edema. No focal infiltrate or sizable effusion is seen. No bony abnormality is noted. IMPRESSION: Mild vascular congestion. Electronically Signed   By: Inez Catalina M.D.   On: 012-10-202019 14:10    Procedures Procedures (including critical care time)  Medications Ordered in ED Medications - No data to display Chest x-ray viewed by me Results for orders placed or performed during the hospital encounter of 16/07/37  Basic metabolic panel  Result Value Ref Range   Sodium 134 (L) 135 - 145 mmol/L   Potassium 4.6 3.5 - 5.1 mmol/L   Chloride 90 (L) 101 - 111 mmol/L   CO2 34 (H) 22 - 32 mmol/L   Glucose, Bld 216 (H) 65 - 99 mg/dL   BUN 34 (H) 6 - 20 mg/dL   Creatinine, Ser 1.88 (H) 0.44 - 1.00 mg/dL   Calcium 9.5 8.9 - 10.3 mg/dL   GFR calc non Af Amer 24 (L) >60 mL/min   GFR calc Af Amer 28 (L) >60 mL/min   Anion gap 10 5 - 15  CBC  Result Value Ref Range   WBC 6.4 4.0 - 10.5 K/uL   RBC 3.06 (L) 3.87 - 5.11 MIL/uL   Hemoglobin 8.4 (L) 12.0 - 15.0 g/dL   HCT 27.9 (L) 36.0 - 46.0 %   MCV 91.2 78.0 - 100.0 fL   MCH 27.5 26.0 - 34.0 pg   MCHC 30.1 30.0 - 36.0 g/dL   RDW 17.3 (H) 11.5 - 15.5 %    Platelets 180 150 - 400 K/uL  Troponin I  Result Value Ref Range   Troponin I 0.08 (HH) <0.03 ng/mL  Urinalysis, Routine w reflex microscopic  Result Value Ref Range   Color, Urine STRAW (A) YELLOW   APPearance CLEAR CLEAR   Specific Gravity, Urine 1.006 1.005 - 1.030   pH 7.0 5.0 - 8.0   Glucose, UA NEGATIVE NEGATIVE mg/dL   Hgb urine dipstick NEGATIVE NEGATIVE   Bilirubin Urine NEGATIVE NEGATIVE   Ketones, ur NEGATIVE NEGATIVE mg/dL   Protein, ur 30 (A) NEGATIVE mg/dL   Nitrite NEGATIVE NEGATIVE   Leukocytes, UA NEGATIVE NEGATIVE   RBC / HPF 0-5 0 - 5 RBC/hpf   WBC, UA 0-5 0 - 5 WBC/hpf   Bacteria, UA NONE SEEN NONE SEEN   Squamous Epithelial / LPF 0-5 0 - 5   Dg Chest 2 View  Result Date: 12-10-202019 CLINICAL DATA:  Shortness of breath EXAM: CHEST - 2 VIEW COMPARISON:  11/05/2017 FINDINGS: Cardiac shadow is enlarged. Defibrillator is again noted and stable. Mild vascular congestion is seen with very minimal interstitial edema. No focal infiltrate or sizable effusion is seen. No bony abnormality is noted. IMPRESSION: Mild vascular congestion. Electronically Signed   By: Inez Catalina M.D.   On: 012-10-202019 14:10   Initial Impression / Assessment and  Plan / ED Course  I have reviewed the triage vital signs and the nursing notes.  Pertinent labs & imaging results that were available during my care of the patient were reviewed by me and considered in my medical decision making (see chart for details).      Case discussed with Dr.Koneswaren all of symptoms patients are chronic.  Lab work consistent with renal insufficiency which is chronic.  Anemia which is slightly worse than 1 month ago however not requiring transfusion.  I do not believe the patient is symptomatic from her anemia.  Also hyperglycemia Dr Drucilla Chalet reveals that he is attempted to persuade patient to go to congestive heart failure clinic however she has repeatedly declined.  Faxed records from [facility]  obtained from yesterday's visit and reviewed by me.Dr Lenox Ahr request the patient follow-up with PCP.  She has a follow-up appointment scheduled for this week.  I believe the leg pain and foot pain is due to neuropathy.  She has no signs of vascular insufficiency no signs of phlebitis or infection troponin mildly elevated however is chronically  elevated Final Clinical Impressions(s) / ED Diagnoses  Diagnoses #1 generalized weakness #2 chronic dyspnea #3 anemia #4 hyperglycemia #5 chronic renal insufficiency #6 bilateral lower extremity pain Final diagnoses:  None    ED Discharge Orders    None       Orlie Dakin, MD 11/06/17 [phone]

## 2017-11-06 NOTE — ED Notes (Signed)
Date and time results received: 11/06/17 3:05 PM  (use smartphrase ".now" to insert current time)  Test: Troponin Critical Value: 0.08  Name of Provider Notified: Jacobowitz  Orders Received? Or Actions Taken?: Orders Received - See Orders for details

## 2017-11-06 NOTE — Discharge Instructions (Addendum)
Keep your scheduled appoint with your primary care physician this week.  Lab work today showed BUN 34.  Creatinine 1.88.  Hemoglobin 8.4.  Glucose 216.  Take these instructions with you to your doctor's office.  You or your doctor can obtain all other  test results

## 2017-11-06 NOTE — ED Triage Notes (Signed)
Patient states "I was at [facility] last night and they told me my heart enzymes were up but I left because they wanted me to change doctors. And both my feet hurt, I have neuropathy and I can't walk for the past several weeks. I feel awful and I'm short of breath."

## 2017-11-06 NOTE — Telephone Encounter (Signed)
Patient called stating that she is having a lot of weakness in her legs. States that she went to [facility]  11/05/2017 and she had to sign herself out because she did not want to be put with [facility] doctor.  States that needs to talk to someone about her medications.  Please call [phone].

## 2017-11-06 NOTE — Telephone Encounter (Signed)
Stated she has been to [facility] 3 times in last 3 months.  Weakness in both legs, was told last evening that her heart enzymes were off.  Stated that she was admitted, but signed herself out because they wanted Hasanaj to be her doctor.  Stated in the past they have called the problem with her legs neuropathy & was in [facility] for this.  Nurse will contact [facility] for notes.  No chest pain, dizzines, or SOB.  Just c/o weakness & can't walk.

## 2017-11-06 NOTE — Telephone Encounter (Signed)
Patient returned call stating that she would be going to [facility].  Did not feel like she could wait.  Notes received - will give to provider for notification.

## 2017-11-06 NOTE — Telephone Encounter (Signed)
Left message to return call 

## 2017-11-08 ENCOUNTER — Telehealth: Payer: Self-pay | Admitting: *Deleted

## 2017-11-08 NOTE — Telephone Encounter (Signed)
Per Dr. Bronson Ing - needs to see GI.  Patient is anemic & Hgb continues to trend down.   Left message to return call.

## 2017-11-09 ENCOUNTER — Other Ambulatory Visit: Payer: Self-pay | Admitting: Cardiovascular Disease

## 2017-11-09 ENCOUNTER — Telehealth (INDEPENDENT_AMBULATORY_CARE_PROVIDER_SITE_OTHER): Payer: Self-pay | Admitting: Internal Medicine

## 2017-11-09 NOTE — Telephone Encounter (Signed)
Patient would like you to call her at [phone]

## 2017-11-09 NOTE — Telephone Encounter (Signed)
I have talked with patient.  Labs should have been sent to her PCP

## 2017-11-13 NOTE — Telephone Encounter (Signed)
Patient notified.  Stated that she saw Dr. Olevia Perches PA Deberah Castle) a couple weeks ago.  Not due to go back till December 04, 2017.  Will call GI office on Wednesday to see if they are working up her anemia.  Patient not sure what they are doing.

## 2017-11-15 NOTE — Telephone Encounter (Signed)
Call placed to Dr. Olevia Perches office.  Left message for nurse to return call.

## 2017-11-15 NOTE — Telephone Encounter (Signed)
Patient called stating that she is very weak. States that she needs to see a GI doctor now.

## 2017-11-15 NOTE — Telephone Encounter (Signed)
Spoke with Tammy in Dr. Olevia Perches office.  They will bring her in for OV on Tuesday, 11/21/2017 at 9:00.  She will call patient for notification.

## 2017-11-16 ENCOUNTER — Ambulatory Visit (INDEPENDENT_AMBULATORY_CARE_PROVIDER_SITE_OTHER): Payer: Medicare Other

## 2017-11-16 DIAGNOSIS — I5042 Chronic combined systolic (congestive) and diastolic (congestive) heart failure: Secondary | ICD-10-CM | POA: Diagnosis not present

## 2017-11-16 DIAGNOSIS — Z95810 Presence of automatic (implantable) cardiac defibrillator: Secondary | ICD-10-CM | POA: Diagnosis not present

## 2017-11-17 NOTE — Progress Notes (Signed)
EPIC Encounter for ICM Monitoring  Patient Name: [name] is a 84 y.o. female Date: 11/17/2017 Primary Care Physican: Loman Brooklyn, FNP Primary Cardiologist: Bronson Ing Electrophysiologist: Allred Dry Weight:162lbs  Bi-V Pacing:97%        Heart Failure questions reviewed, pt stated she has been dx with anemia.  She continues to have trouble walking.  She has been having some nausea and unable to take all her meds sometimes. Several ER visits for leg weakness and per ER note is probably related to neuropathy. Most recent visit was 10-Oct-202019.   Thoracic impedance abnormal suggesting fluid accumulation since 09/08/2017 and progressively worsening.  Worsening impedance correlates with decrease in Torsemide dosage on 09/02/2017.  Prescribed dosage: Torsemide 20 mg 1 tablets (20 mg total) daily (decreased at 09/02/17 visit). Metolazone 2.5 mg one tab by mouth on Monday &Friday. She takes Metolazone PRN instead of twice a week.  Recommendations:  She stated she will try to take the Torsemide and Metolzaone when she is less nauseated.   Encouraged to call for fluid symptoms.  Follow-up plan: ICM clinic phone appointment on 11/23/2017 to recheck fluid levels.  Office appointment scheduled 01/26/2018 with Dr. Rayann Heman.  Copy of ICM check sent to Dr. Rayann Heman and Dr. Bronson Ing.   3 month ICM trend: 11/16/2017    1 Year ICM trend:       Rosalene Billings, RN 11/17/2017 10:29 AM

## 2017-11-21 ENCOUNTER — Encounter (INDEPENDENT_AMBULATORY_CARE_PROVIDER_SITE_OTHER): Payer: Self-pay | Admitting: Internal Medicine

## 2017-11-21 ENCOUNTER — Ambulatory Visit (INDEPENDENT_AMBULATORY_CARE_PROVIDER_SITE_OTHER): Payer: Medicare Other | Admitting: Internal Medicine

## 2017-11-21 VITALS — BP 132/80 | HR 64 | Temp 97.6°F | Ht 65.0 in | Wt 164.2 lb

## 2017-11-21 DIAGNOSIS — D508 Other iron deficiency anemias: Secondary | ICD-10-CM | POA: Diagnosis not present

## 2017-11-21 LAB — CBC WITH DIFFERENTIAL/PLATELET
BASOS PCT: 1 %
Basophils Absolute: 61 cells/uL (ref 0–200)
EOS PCT: 4.1 %
Eosinophils Absolute: 250 cells/uL (ref 15–500)
HEMATOCRIT: 28.5 % — AB (ref 35.0–45.0)
HEMOGLOBIN: 9.5 g/dL — AB (ref 11.7–15.5)
LYMPHS ABS: 665 {cells}/uL — AB (ref 850–3900)
MCH: 27.9 pg (ref 27.0–33.0)
MCHC: 33.3 g/dL (ref 32.0–36.0)
MCV: 83.8 fL (ref 80.0–100.0)
MPV: 9.9 fL (ref 7.5–12.5)
Monocytes Relative: 7.7 %
NEUTROS ABS: 4654 {cells}/uL (ref 1500–7800)
NEUTROS PCT: 76.3 %
Platelets: 224 10*3/uL (ref 140–400)
RBC: 3.4 10*6/uL — ABNORMAL LOW (ref 3.80–5.10)
RDW: 15.8 % — ABNORMAL HIGH (ref 11.0–15.0)
Total Lymphocyte: 10.9 %
WBC: 6.1 10*3/uL (ref 3.8–10.8)
WBCMIX: 470 {cells}/uL (ref 200–950)

## 2017-11-21 LAB — HEPATIC FUNCTION PANEL
AG RATIO: 1.2 (calc) (ref 1.0–2.5)
ALBUMIN MSPROF: 3.9 g/dL (ref 3.6–5.1)
ALT: 10 U/L (ref 6–29)
AST: 18 U/L (ref 10–35)
Alkaline phosphatase (APISO): 95 U/L (ref 33–130)
BILIRUBIN DIRECT: 0.4 mg/dL — AB (ref 0.0–0.2)
GLOBULIN: 3.3 g/dL (ref 1.9–3.7)
Indirect Bilirubin: 1.2 mg/dL (calc) (ref 0.2–1.2)
Total Bilirubin: 1.6 mg/dL — ABNORMAL HIGH (ref 0.2–1.2)
Total Protein: 7.2 g/dL (ref 6.1–8.1)

## 2017-11-21 LAB — IRON, TOTAL/TOTAL IRON BINDING CAP
%SAT: 11 % (ref 11–50)
Iron: 34 ug/dL — ABNORMAL LOW (ref 45–160)
TIBC: 322 mcg/dL (calc) (ref 250–450)

## 2017-11-21 LAB — FERRITIN: FERRITIN: 126 ng/mL (ref 20–288)

## 2017-11-21 NOTE — Patient Instructions (Signed)
Labs today

## 2017-11-21 NOTE — Progress Notes (Addendum)
Subjective:    Patient ID: [name], female    DOB: 04-26-1934, 84 y.o.   MRN: [id number]  HPI Here today for f/u Last seen in April as a new patient for cirrhosis. Auto immune process ruled out.  States was seen at [facility] and was told her hemoglobin was low. Seen in the ED 2020-05-2218 for leg pain, weakness.  Her stool was guaiac positive in the ED. She tells me her BM are brown in color. Has a BM once a week. Appetite is not good. She makes herself eat.   Hx of bleeding ulcer last year.  She recently did rehab at [facility] in April  for weakness x 21 days.  She tells me last year she had some kind of reaction to the blood transfusion.  Her last colonoscopy was in 2011 with 2 small polyps.Biopsy: serrated adenoma.    CBC Latest Ref Rng & Units 2020-05-2218 09/25/2017 06/06/2015  WBC 4.0 - 10.5 K/uL 6.4 6.3 4.3  Hemoglobin 12.0 - 15.0 g/dL 8.4(L) 9.6(L) 11.4(L)  Hematocrit 36.0 - 46.0 % 27.9(L) 28.5(L) 35.2(L)  Platelets 150 - 400 K/uL 180 238 122(L)         09/29/2017 US abdomen:  Status post cholecystectomy.  Findings consistent with hepatic cirrhosis with probable mild splenomegaly.  No other abnormality seen in the abdomen.    Hx of CHF, AICD, COPD, chronic respiratory failure, Atrial Fib, diabetes.  Hx of GERD. Seen at [facility] 08/15/2017 with SOB. Underwent a CT to rule out PE. CT revealed cirrhosis with splenomegaly.   08/16/2017 Ct abdomen/pelvis w/o: abdominal pain Appearance of the liver suggests underlying hepatic cirrhosis. No focal lesions are evident on this non contrast enhanced study. Splenomegaly without focal splenic lesions.     Review of Systems    Past Medical History:  Diagnosis Date  . Aneurysm ([place])    Apical; previously on Coumadin  . Anxiety disorder   . Cancer (HCC)    Skin  . Chronic renal insufficiency    Creatinine 1.17 January 2010  . Coronary artery disease    Cath 02/15/12 revealed RCA as culprit lesion w/  occlusion at site of prior stent, patent LAD stent, chronic LCx and Diagonal dz; Medical therapy   . Depression   . GERD (gastroesophageal reflux disease)   . Hyperlipidemia    H/o GI intolerance to Lipitor  . Impingement syndrome of left shoulder   . Ischemic cardiomyopathy    s/p Medtronic BiV ICD 2006  . Peripheral polyneuropathy    Refractory to several medications  . Systolic and diastolic CHF, chronic (HCC)    echo 03/2012 EF 50-93%, mod diastolic dysfunction, mod MR, mod LAE, mod-severe TR, PASP 7mmHg  . Type 2 diabetes mellitus ([place])     Past Surgical History:  Procedure Laterality Date  . ABDOMINAL HYSTERECTOMY    . CARDIAC DEFIBRILLATOR PLACEMENT  05/28/2009   Medtronic ICD placed by Dr. Caryl Comes secondary to ICM/CHF, [phone] Fidelis lead fracture with ICD storm 2010 requiring new RV lead placement  . CHOLECYSTECTOMY    . EYE SURGERY    . IMPLANTABLE CARDIOVERTER DEFIBRILLATOR GENERATOR CHANGE  09/26/13   BiV ICD generator change by Dr Rayann Heman - MDT Pershing Proud XT CRT-D  . IMPLANTABLE CARDIOVERTER DEFIBRILLATOR GENERATOR CHANGE N/A 09/26/2013   Procedure: IMPLANTABLE CARDIOVERTER DEFIBRILLATOR GENERATOR CHANGE;  Surgeon: Coralyn Mark, MD;  Location: [facility] CATH LAB;  Service: Cardiovascular;  Laterality: N/A;  . LEFT HEART CATHETERIZATION WITH CORONARY ANGIOGRAM N/A 02/15/2012  Procedure: LEFT HEART CATHETERIZATION WITH CORONARY ANGIOGRAM;  Surgeon: Peter M Martinique, MD;  Location: [facility] CATH LAB;  Service: Cardiovascular;  Laterality: N/A;  . RIGHT HEART CATHETERIZATION N/A 04/27/2012   Procedure: RIGHT HEART CATH;  Surgeon: Larey Dresser, MD;  Location: [facility] CATH LAB;  Service: Cardiovascular;  Laterality: N/A;    Allergies  Allergen Reactions  . Augmentin [Amoxicillin-Pot Clavulanate]     Abdominal pain  . Metformin     Upset stomach  . Metolazone     "made everything hurt" 07/03/15  . Sulfonamide Derivatives     swelling    Current Outpatient Medications on File Prior to Visit    Medication Sig Dispense Refill  . albuterol (ACCUNEB) 0.63 MG/3ML nebulizer solution Take 1 ampule by nebulization every 6 (six) hours as needed for wheezing.    Marland Kitchen allopurinol (ZYLOPRIM) 100 MG tablet Take 100 mg by mouth daily.    Marland Kitchen ALPRAZolam (XANAX) 0.5 MG tablet Take 0.5 mg by mouth at bedtime as needed for anxiety.    . Ascorbic Acid (VITAMIN C) 100 MG tablet Take 100 mg by mouth daily.    Marland Kitchen aspirin 81 MG chewable tablet Chew 1 tablet (81 mg total) by mouth daily.    . carvedilol (COREG) 3.125 MG tablet Take 1 tablet (3.125 mg total) by mouth 2 (two) times daily with a meal. 180 tablet 3  . citalopram (CELEXA) 20 MG tablet Take 20 mg by mouth daily.    . colchicine 0.6 MG tablet Take 0.6 mg by mouth as needed.    . cyanocobalamin 2000 MCG tablet Take 2,000 mcg by mouth daily.    . ergocalciferol (VITAMIN D2) 50000 units capsule Take 50,000 Units by mouth 2 (two) times a week.    Marland Kitchen HYDROmorphone (DILAUDID) 4 MG tablet Take 6 mg by mouth 3 (three) times daily.     . Nebulizer MISC Inhale into the lungs.    . nitroGLYCERIN (NITROSTAT) 0.4 MG SL tablet Place 1 tablet (0.4 mg total) under the tongue every 5 (five) minutes as needed for chest pain. 25 tablet 3  . OXYGEN-HELIUM IN 2 L as needed.    . promethazine (PHENERGAN) 25 MG tablet Take 25 mg by mouth every 6 (six) hours as needed for nausea or vomiting.    . ranitidine (ZANTAC) 150 MG tablet Take 150 mg by mouth 2 (two) times daily.     Marland Kitchen torsemide (DEMADEX) 20 MG tablet Take 2 tablets (40 mg total) by mouth 2 (two) times daily. (Patient taking differently: Take 20 mg by mouth daily. ) 120 tablet 6  . insulin glargine (LANTUS) 100 UNIT/ML injection Inject 40 Units into the skin 2 (two) times daily.     . metolazone (ZAROXOLYN) 2.5 MG tablet TAKE 1 TABLET 2 TIMES A WEEK ON MONDAYS AND FRIDAYS. 8 tablet 1   No current facility-administered medications on file prior to visit.         Objective:   Physical Exam Blood pressure 132/80,  pulse 64, temperature 97.6 F (36.4 C), height 5\' 5"  (1.651 m), weight 164 lb 3.2 oz (74.5 kg). Alert and oriented. Skin warm and dry. Oral mucosa is moist.   . Sclera anicteric, conjunctivae is pink. Thyroid not enlarged. No cervical lymphadenopathy. Lungs clear. Heart regular rate and rhythm.  Abdomen is soft. Bowel sounds are positive. No hepatomegaly. No abdominal masses felt. No tenderness.  No edema to lower extremities.  Stool brown and guaiac negative.  Assessment & Plan:  IDA. Am going to get  a CBC and Hepatic. Guaiac positive stool.Stool negative today. Am going to get Iron studies.  She will need medical clearance before we can proceed with a colonoscopy/EGD.

## 2017-11-23 ENCOUNTER — Ambulatory Visit (INDEPENDENT_AMBULATORY_CARE_PROVIDER_SITE_OTHER): Payer: Self-pay

## 2017-11-23 DIAGNOSIS — Z95810 Presence of automatic (implantable) cardiac defibrillator: Secondary | ICD-10-CM

## 2017-11-23 DIAGNOSIS — I5042 Chronic combined systolic (congestive) and diastolic (congestive) heart failure: Secondary | ICD-10-CM

## 2017-11-23 NOTE — Progress Notes (Signed)
EPIC Encounter for ICM Monitoring  Patient Name: [name] is a 84 y.o. female Date: 11/23/2017 Primary Care Physican: Loman Brooklyn, FNP Primary Cardiologist: Bronson Ing Electrophysiologist: Allred Dry Weight:156lbs  Bi-V Pacing:97.3%       Heart Failure questions reviewed, pt reports shortness of breath.  She lost 5 pounds since 11/16/2017.  She has numbness of legs and weakness.  She said she has anemia but is refusing a colonoscopy. She said she would not be able to endure the procedure.     Thoracic impedance slightly improved since last ICM transmission on 11/16/2017 but continues to be abnormal suggesting fluid accumulation.  Prescribed dosage: Torsemide 20 mg 1 tablets (20 mg total) daily (decreased at 09/02/17 visit). Metolazone 2.5 mg one tab by mouth on Monday &Friday. She takes Metolazone PRN instead of twice a week.  LABS: 11/09/2017 Creatinine 1.88, BUN 34, Potassium 4.6, Sodium 134, EGFR 24-28  Recommendations:  She said she cannot take the Metolazone and she will self adjust the Torsemide to 2 tablets daily x 1-2 days.   Advised to call 911 or use ER if needed for worsening or new symptoms.   Follow-up plan: ICM clinic phone appointment on 12/04/2017 to recheck fluid levels.  Office appointment scheduled 01/26/2018 with Dr. Rayann Heman.  Patient has declined HF clinic as recommended by Dr Bronson Ing.  Copy of ICM check sent to Dr. Bronson Ing and Dr. Rayann Heman.   3 month ICM trend: 11/23/2017    1 Year ICM trend:       Rosalene Billings, RN 11/23/2017 11:28 AM

## 2017-11-27 ENCOUNTER — Other Ambulatory Visit: Payer: Self-pay

## 2017-11-27 ENCOUNTER — Encounter (HOSPITAL_COMMUNITY): Payer: Self-pay | Admitting: Emergency Medicine

## 2017-11-27 ENCOUNTER — Inpatient Hospital Stay (HOSPITAL_COMMUNITY)
Admission: EM | Admit: 2017-11-27 | Discharge: 2017-11-29 | DRG: 292 | Disposition: A | Discharge status: Home Health | Payer: Medicare Other | Attending: Internal Medicine | Admitting: Internal Medicine

## 2017-11-27 ENCOUNTER — Emergency Department (HOSPITAL_COMMUNITY): Payer: Medicare Other

## 2017-11-27 DIAGNOSIS — E1142 Type 2 diabetes mellitus with diabetic polyneuropathy: Secondary | ICD-10-CM | POA: Diagnosis present

## 2017-11-27 DIAGNOSIS — I255 Ischemic cardiomyopathy: Secondary | ICD-10-CM | POA: Diagnosis present

## 2017-11-27 DIAGNOSIS — R7989 Other specified abnormal findings of blood chemistry: Secondary | ICD-10-CM

## 2017-11-27 DIAGNOSIS — Z87891 Personal history of nicotine dependence: Secondary | ICD-10-CM | POA: Diagnosis not present

## 2017-11-27 DIAGNOSIS — E785 Hyperlipidemia, unspecified: Secondary | ICD-10-CM | POA: Diagnosis present

## 2017-11-27 DIAGNOSIS — Z95810 Presence of automatic (implantable) cardiac defibrillator: Secondary | ICD-10-CM

## 2017-11-27 DIAGNOSIS — I5043 Acute on chronic combined systolic (congestive) and diastolic (congestive) heart failure: Secondary | ICD-10-CM | POA: Diagnosis not present

## 2017-11-27 DIAGNOSIS — Z880 Allergy status to penicillin: Secondary | ICD-10-CM

## 2017-11-27 DIAGNOSIS — F329 Major depressive disorder, single episode, unspecified: Secondary | ICD-10-CM | POA: Diagnosis present

## 2017-11-27 DIAGNOSIS — I5023 Acute on chronic systolic (congestive) heart failure: Secondary | ICD-10-CM | POA: Diagnosis not present

## 2017-11-27 DIAGNOSIS — Z66 Do not resuscitate: Secondary | ICD-10-CM | POA: Diagnosis present

## 2017-11-27 DIAGNOSIS — Z85828 Personal history of other malignant neoplasm of skin: Secondary | ICD-10-CM | POA: Diagnosis not present

## 2017-11-27 DIAGNOSIS — J9610 Chronic respiratory failure, unspecified whether with hypoxia or hypercapnia: Secondary | ICD-10-CM | POA: Diagnosis present

## 2017-11-27 DIAGNOSIS — J449 Chronic obstructive pulmonary disease, unspecified: Secondary | ICD-10-CM | POA: Diagnosis present

## 2017-11-27 DIAGNOSIS — Z9981 Dependence on supplemental oxygen: Secondary | ICD-10-CM | POA: Diagnosis not present

## 2017-11-27 DIAGNOSIS — I2510 Atherosclerotic heart disease of native coronary artery without angina pectoris: Secondary | ICD-10-CM | POA: Diagnosis present

## 2017-11-27 DIAGNOSIS — G894 Chronic pain syndrome: Secondary | ICD-10-CM | POA: Diagnosis present

## 2017-11-27 DIAGNOSIS — Z794 Long term (current) use of insulin: Secondary | ICD-10-CM

## 2017-11-27 DIAGNOSIS — E1140 Type 2 diabetes mellitus with diabetic neuropathy, unspecified: Secondary | ICD-10-CM | POA: Diagnosis not present

## 2017-11-27 DIAGNOSIS — Z882 Allergy status to sulfonamides status: Secondary | ICD-10-CM | POA: Diagnosis not present

## 2017-11-27 DIAGNOSIS — F419 Anxiety disorder, unspecified: Secondary | ICD-10-CM | POA: Diagnosis present

## 2017-11-27 DIAGNOSIS — Z7982 Long term (current) use of aspirin: Secondary | ICD-10-CM

## 2017-11-27 DIAGNOSIS — Z888 Allergy status to other drugs, medicaments and biological substances status: Secondary | ICD-10-CM

## 2017-11-27 DIAGNOSIS — R748 Abnormal levels of other serum enzymes: Secondary | ICD-10-CM | POA: Diagnosis present

## 2017-11-27 DIAGNOSIS — E782 Mixed hyperlipidemia: Secondary | ICD-10-CM | POA: Diagnosis not present

## 2017-11-27 DIAGNOSIS — K219 Gastro-esophageal reflux disease without esophagitis: Secondary | ICD-10-CM | POA: Diagnosis present

## 2017-11-27 DIAGNOSIS — R778 Other specified abnormalities of plasma proteins: Secondary | ICD-10-CM | POA: Diagnosis present

## 2017-11-27 DIAGNOSIS — I509 Heart failure, unspecified: Secondary | ICD-10-CM

## 2017-11-27 DIAGNOSIS — E119 Type 2 diabetes mellitus without complications: Secondary | ICD-10-CM | POA: Diagnosis present

## 2017-11-27 DIAGNOSIS — Z955 Presence of coronary angioplasty implant and graft: Secondary | ICD-10-CM

## 2017-11-27 DIAGNOSIS — R079 Chest pain, unspecified: Secondary | ICD-10-CM

## 2017-11-27 LAB — COMPREHENSIVE METABOLIC PANEL
ALT: 15 U/L (ref 14–54)
ANION GAP: 10 (ref 5–15)
AST: 25 U/L (ref 15–41)
Albumin: 3.8 g/dL (ref 3.5–5.0)
Alkaline Phosphatase: 88 U/L (ref 38–126)
BUN: 20 mg/dL (ref 6–20)
CHLORIDE: 90 mmol/L — AB (ref 101–111)
CO2: 32 mmol/L (ref 22–32)
CREATININE: 1.2 mg/dL — AB (ref 0.44–1.00)
Calcium: 9.1 mg/dL (ref 8.9–10.3)
GFR, EST AFRICAN AMERICAN: 48 mL/min — AB (ref >60)
GFR, EST NON AFRICAN AMERICAN: 41 mL/min — AB (ref >60)
Glucose, Bld: 177 mg/dL — ABNORMAL HIGH (ref 65–99)
Potassium: 3.8 mmol/L (ref 3.5–5.1)
SODIUM: 132 mmol/L — AB (ref 135–145)
Total Bilirubin: 1.8 mg/dL — ABNORMAL HIGH (ref 0.3–1.2)
Total Protein: 7.6 g/dL (ref 6.5–8.1)

## 2017-11-27 LAB — CBC WITH DIFFERENTIAL/PLATELET
Basophils Absolute: 0 10*3/uL (ref 0.0–0.1)
Basophils Relative: 0 %
EOS ABS: 0.2 10*3/uL (ref 0.0–0.7)
EOS PCT: 4 %
HCT: 34.1 % — ABNORMAL LOW (ref 36.0–46.0)
Hemoglobin: 10.2 g/dL — ABNORMAL LOW (ref 12.0–15.0)
LYMPHS ABS: 0.8 10*3/uL (ref 0.7–4.0)
LYMPHS PCT: 15 %
MCH: 26.6 pg (ref 26.0–34.0)
MCHC: 29.9 g/dL — AB (ref 30.0–36.0)
MCV: 89 fL (ref 78.0–100.0)
MONO ABS: 0.4 10*3/uL (ref 0.1–1.0)
Monocytes Relative: 7 %
Neutro Abs: 3.7 10*3/uL (ref 1.7–7.7)
Neutrophils Relative %: 74 %
PLATELETS: 208 10*3/uL (ref 150–400)
RBC: 3.83 MIL/uL — ABNORMAL LOW (ref 3.87–5.11)
RDW: 15.7 % — AB (ref 11.5–15.5)
WBC: 5.1 10*3/uL (ref 4.0–10.5)

## 2017-11-27 LAB — BRAIN NATRIURETIC PEPTIDE: B NATRIURETIC PEPTIDE 5: 1938 pg/mL — AB (ref 0.0–100.0)

## 2017-11-27 LAB — TROPONIN I: Troponin I: 0.05 ng/mL (ref <0.03)

## 2017-11-27 MED ORDER — FUROSEMIDE 10 MG/ML IJ SOLN
80.0000 mg | Freq: Once | INTRAMUSCULAR | Status: AC
  Administered 2017-11-27: 80 mg via INTRAVENOUS
  Filled 2017-11-27: qty 8

## 2017-11-27 MED ORDER — ASPIRIN 81 MG PO CHEW
324.0000 mg | CHEWABLE_TABLET | Freq: Once | ORAL | Status: AC
  Administered 2017-11-27: 324 mg via ORAL
  Filled 2017-11-27: qty 4

## 2017-11-27 MED ORDER — HYDROMORPHONE HCL 2 MG PO TABS
4.0000 mg | ORAL_TABLET | Freq: Once | ORAL | Status: AC
  Administered 2017-11-27: 4 mg via ORAL
  Filled 2017-11-27: qty 2

## 2017-11-27 NOTE — H&P (Signed)
TRH H&P    Patient Demographics:    [name], is a 84 y.o. female  MRN: [id number]  DOB - 04/15/1934  Admit Date - 11/27/2017  Referring MD/NP/PA: Dr. Dayna Barker  Outpatient Primary MD for the patient is Loman Brooklyn, FNP  Patient coming from: Home  Chief complaint-shortness of breath   HPI:    [name]  is a 84 y.o. female With history of chronic respiratory failure on home O2, chronic systolic and diastolic CHF GERD, CAD, status post stent placement, hyperlipidemia, ischemic cardiomyopathy status post biventricular ICD, COPD, diabetes mellitus who came to hospital with worsening shortness of breath.  Patient says that she is trying to wean off her Dilaudid which she was taking for chronic pain syndrome.  She did complain of chest pain today She denies nausea, vomiting or diarrhea. No fever or chills. Denies abdominal pain or dysuria. In the ED, lab work showed BNP 1938, troponin 0.05 Patient received Lasix 80 mg IV in the ED   Review of systems:      All other systems reviewed and are negative.   With Past History of the following :    Past Medical History:  Diagnosis Date  . Aneurysm ([place])    Apical; previously on Coumadin  . Anxiety disorder   . Cancer (HCC)    Skin  . Chronic renal insufficiency    Creatinine 1.17 January 2010  . Coronary artery disease    Cath 02/15/12 revealed RCA as culprit lesion w/ occlusion at site of prior stent, patent LAD stent, chronic LCx and Diagonal dz; Medical therapy   . Depression   . GERD (gastroesophageal reflux disease)   . Hyperlipidemia    H/o GI intolerance to Lipitor  . Impingement syndrome of left shoulder   . Ischemic cardiomyopathy    s/p Medtronic BiV ICD 2006  . Peripheral polyneuropathy    Refractory to several medications  . Systolic and diastolic CHF, chronic (HCC)    echo 03/2012 EF 49-70%, mod diastolic  dysfunction, mod MR, mod LAE, mod-severe TR, PASP 62mmHg  . Type 2 diabetes mellitus ([place])       Past Surgical History:  Procedure Laterality Date  . ABDOMINAL HYSTERECTOMY    . CARDIAC DEFIBRILLATOR PLACEMENT  05/28/2009   Medtronic ICD placed by Dr. Caryl Comes secondary to ICM/CHF, [phone] Fidelis lead fracture with ICD storm 2010 requiring new RV lead placement  . CHOLECYSTECTOMY    . EYE SURGERY    . IMPLANTABLE CARDIOVERTER DEFIBRILLATOR GENERATOR CHANGE  09/26/13   BiV ICD generator change by Dr Rayann Heman - MDT Pershing Proud XT CRT-D  . IMPLANTABLE CARDIOVERTER DEFIBRILLATOR GENERATOR CHANGE N/A 09/26/2013   Procedure: IMPLANTABLE CARDIOVERTER DEFIBRILLATOR GENERATOR CHANGE;  Surgeon: Coralyn Mark, MD;  Location: [facility] CATH LAB;  Service: Cardiovascular;  Laterality: N/A;  . LEFT HEART CATHETERIZATION WITH CORONARY ANGIOGRAM N/A 02/15/2012   Procedure: LEFT HEART CATHETERIZATION WITH CORONARY ANGIOGRAM;  Surgeon: Peter M Martinique, MD;  Location[facility] C[facility] LAB;  Service: Cardiovascular;  Laterality: N/A;  . RIGHT HEART CATHETERIZATION N/A 04/27/2012  Procedure: RIGHT HEART CATH;  Surgeon: Larey Dresser, MD;  Location: [facility] CATH LAB;  Service: Cardiovascular;  Laterality: N/A;      Social History:      Social History   Tobacco Use  . Smoking status: Former Smoker    Packs/day: 0.80    Years: 30.00    Pack years: 24.00    Types: Cigarettes    Start date: 04/05/1957    Last attempt to quit: 06/27/2002    Years since quitting: 15.4  . Smokeless tobacco: Never Used  Substance Use Topics  . Alcohol use: No    Alcohol/week: 0.0 oz       Family History :     Family History  Problem Relation Age of Onset  . Heart attack Father   . Heart disease Sister   . Heart disease Brother   . Coronary artery disease Neg Hx       Home Medications:   Prior to Admission medications   Medication Sig Start Date End Date Taking? Authorizing Provider  albuterol (ACCUNEB) 0.63 MG/3ML nebulizer solution Take 1  ampule by nebulization every 6 (six) hours as needed for wheezing.   Yes [provider]  allopurinol (ZYLOPRIM) 100 MG tablet Take 200 mg by mouth daily.    Yes [provider]  ALPRAZolam Duanne Moron) 0.5 MG tablet Take 0.5 mg by mouth at bedtime.    Yes [provider]  Ascorbic Acid (VITAMIN C) 500 MG CAPS Take 100 mg by mouth daily.    Yes [provider]  aspirin 81 MG chewable tablet Chew 1 tablet (81 mg total) by mouth daily. 06/06/15  Yes Rexene Alberts, MD  carvedilol (COREG) 3.125 MG tablet Take 1 tablet (3.125 mg total) by mouth 2 (two) times daily with a meal. 09/15/17  Yes Herminio Commons, MD  citalopram (CELEXA) 20 MG tablet Take 20 mg by mouth at bedtime.    Yes [provider]  colchicine 0.6 MG tablet Take 0.6 mg by mouth daily as needed (for pan associated with gout flares).    Yes [provider]  cyanocobalamin 2000 MCG tablet Take 2,000 mcg by mouth daily.   Yes [provider]  ergocalciferol (VITAMIN D2) 50000 units capsule Take 50,000 Units by mouth 2 (two) times a week.   Yes [provider]  HYDROmorphone (DILAUDID) 4 MG tablet Take 4 mg by mouth every 8 (eight) hours.    Yes [provider]  insulin glargine (LANTUS) 100 UNIT/ML injection Inject 20 Units into the skin 2 (two) times daily.  02/20/12  Yes Hope, Jessica A, PA-C  metolazone (ZAROXOLYN) 2.5 MG tablet TAKE 1 TABLET 2 TIMES A WEEK ON MONDAYS AND FRIDAYS. Patient taking differently: TAKE 1 TABLET AS NEEDED FOR FLUID RETENTION 11/09/17  Yes Herminio Commons, MD  nitroGLYCERIN (NITROSTAT) 0.4 MG SL tablet Place 1 tablet (0.4 mg total) under the tongue every 5 (five) minutes as needed for chest pain. 06/14/13  Yes Minus Breeding, MD  OXYGEN Inhale 2 L into the lungs continuous.   Yes [provider]  ranitidine (ZANTAC) 150 MG tablet Take 150 mg by mouth 2 (two) times daily.    Yes [provider]  torsemide  (DEMADEX) 20 MG tablet Take 2 tablets (40 mg total) by mouth 2 (two) times daily. Patient taking differently: Take 20 mg by mouth daily.  09/02/16  Yes Thompson Grayer, MD     Allergies:     Allergies  Allergen Reactions  .  Augmentin [Amoxicillin-Pot Clavulanate]     Abdominal pain  . Metformin     Upset stomach  . Metolazone     "made everything hurt" 07/03/15  . Sulfonamide Derivatives     swelling     Physical Exam:   Vitals  Blood pressure (!) 139/93, pulse 74, temperature 97.6 F (36.4 C), resp. rate (!) 24, height 5\' 5"  (1.651 m), weight 74.8 kg (165 lb), SpO2 100 %.  1.  General: Appears in no acute distress  2. Psychiatric:  Intact judgement and  insight, awake alert, oriented x 3.  3. Neurologic: No focal neurological deficits, all cranial nerves intact.Strength 5/5 all 4 extremities, sensation intact all 4 extremities, plantars down going.  4. Eyes :  anicteric sclerae, moist conjunctivae with no lid lag. PERRLA.  5. ENMT:  Oropharynx clear with moist mucous membranes and good dentition  6. Neck:  supple, no cervical lymphadenopathy appriciated, No thyromegaly  7. Respiratory : Normal respiratory effort, bibasilar crackles auscultated, scattered wheezing  8. Cardiovascular : RRR, no gallops, rubs or murmurs, no leg edema  9. Gastrointestinal:  Positive bowel sounds, abdomen soft, non-tender to palpation,no hepatosplenomegaly, no rigidity or guarding       10. Skin:  No cyanosis, normal texture and turgor, no rash, lesions or ulcers  11.Musculoskeletal:  Good muscle tone,  joints appear normal , no effusions,  normal range of motion    Data Review:    CBC Recent Labs  Lab 11/21/17 0941 11/27/17 2127  WBC 6.1 5.1  HGB 9.5* 10.2*  HCT 28.5* 34.1*  PLT 224 208  MCV 83.8 89.0  MCH 27.9 26.6  MCHC 33.3 29.9*  RDW 15.8* 15.7*  LYMPHSABS 665* 0.8  MONOABS  --  0.4  EOSABS 250 0.2  BASOSABS 61 0.0    ------------------------------------------------------------------------------------------------------------------  Chemistries  Recent Labs  Lab 11/21/17 0941 11/27/17 2127  NA  --  132*  K  --  3.8  CL  --  90*  CO2  --  32  GLUCOSE  --  177*  BUN  --  20  CREATININE  --  1.20*  CALCIUM  --  9.1  AST 18 25  ALT 10 15  ALKPHOS  --  88  BILITOT 1.6* 1.8*   ------------------------------------------------------------------------------------------------------------------  ------------------------------------------------------------------------------------------------------------------ GFR: Estimated Creatinine Clearance: 37.2 mL/min (A) (by C-G formula based on SCr of 1.2 mg/dL (H)). Liver Function Tests: Recent Labs  Lab 11/21/17 0941 11/27/17 2127  AST 18 25  ALT 10 15  ALKPHOS  --  88  BILITOT 1.6* 1.8*  PROT 7.2 7.6  ALBUMIN  --  3.8   No results for input(s): LIPASE, AMYLASE in the last 168 hours. No results for input(s): AMMONIA in the last 168 hours. Coagulation Profile: No results for input(s): INR, PROTIME in the last 168 hours. Cardiac Enzymes: Recent Labs  Lab 11/27/17 2127  TROPONINI 0.05*   BNP (last 3 results) No results for input(s): PROBNP in the last 8760 hours. HbA1C: No results for input(s): HGBA1C in the last 72 hours. CBG: No results for input(s): GLUCAP in the last 168 hours. Lipid Profile: No results for input(s): CHOL, HDL, LDLCALC, TRIG, CHOLHDL, LDLDIRECT in the last 72 hours. Thyroid Function Tests: No results for input(s): TSH, T4TOTAL, FREET4, T3FREE, THYROIDAB in the last 72 hours. Anemia Panel: No results for input(s): VITAMINB12, FOLATE, FERRITIN, TIBC, IRON, RETICCTPCT in the last 72 hours.  --------------------------------------------------------------------------------------------------------------- Urine analysis:    Component Value Date/Time   COLORURINE STRAW (A)  2020-06-2617 [place] 2020-06-2617  1524   LABSPEC 1.006 2020-06-2617 1524   PHURINE 7.0 2020-06-2617 [place] 2020-06-2617 [place] 2020-06-2617 [place] 2020-06-2617 [place] 2020-06-2617 1524   PROTEINUR 30 (A) 2020-06-2617 1524   NITRITE NEGATIVE 2020-06-2617 [place] 2020-06-2617 1524      Imaging Results:    Dg Chest 2 View  Result Date: 11/27/2017 CLINICAL DATA:  Chest pain and shortness of breath. EXAM: CHEST - 2 VIEW COMPARISON:  Radiograph 2020-06-2617 FINDINGS: Cardiomegaly and mediastinal contours are unchanged. Left-sided pacemaker with unchanged leads, abandoned pacer wires also unchanged. Vascular congestion with mild progression from prior. No pleural effusion or confluent airspace disease. No pneumothorax. IMPRESSION: Slight progression of vascular congestion from prior exam. Cardiomegaly is stable. Electronically Signed   By: Jeb Levering M.D.   On: 11/27/2017 21:48    My personal review of EKG: Rhythm paced rhythm  Assessment & Plan:    Active Problems:   HLD (hyperlipidemia)   Diabetes mellitus with neuropathy (HCC)   Acute on chronic systolic heart failure (HCC)   Elevated troponin I level   CHF exacerbation (HCC)   1. Acute on chronic combined systolic and diastolic CHF-patient's last ejection fraction from September 2018 was 45%, with grade 1 diastolic dysfunction.  Patient was given Lasix 80 mg IV in the ED.  Will start Lasix 40 mg IV every 2 hours from tomorrow morning.  Daily weights.  Strict intake and output. 2. Chest pain-patient had mild chest pain which is resolved at this time, had mild elevation of troponin 0.08, 0.05.  Will cycle troponin every 6 hours x3. 3. Diabetes mellitus-continue home Lantus, start sliding scale insulin with NovoLog.  Check CBG q. before meals and at bedtime. 4. COPD-patient has mild wheezing bilaterally, will start DuoNeb nebulizers every 6 hours. 5. Chronic pain syndrome-patient is trying to  get off Dilaudid she was taking Dilaudid 4 mg every 8 hours at home.  Will change Dilaudid to 4 mg every 8 hours as needed 6. CAD-stable, continue aspirin, Coreg   DVT Prophylaxis-   Lovenox   AM Labs Ordered, also please review Full Orders  Family Communication: Admission, patients condition and plan of care including tests being ordered have been discussed with the patient  who indicate understanding and agree with the plan and Code Status.  Code Status: DNR  Admission status: Inpatient  Time spent in minutes : 60 minutes   Oswald Hillock M.D on 11/27/2017 at 11:58 PM  Between 7am to 7pm - Pager - [phone]. After 7pm go to [email or web address] - password [facility]  [facility] - Office  [phone]

## 2017-11-27 NOTE — ED Triage Notes (Signed)
Pt states she took dilaudid this am and cannot find the remainder of her medications. Pt states she is jerking on the inside.

## 2017-11-27 NOTE — ED Notes (Signed)
CRITICAL VALUE ALERT  Critical Value:  Troponin 0.05  Date & Time Notied:  11/27/17 2246  Provider Notified: EDP [place]   Orders Received/Actions taken: No orders at this time

## 2017-11-27 NOTE — ED Provider Notes (Signed)
Emergency Department Provider Note   I have reviewed the triage vital signs and the nursing notes.   HISTORY  Chief Complaint Chest Pain and Shortness of Breath   HPI [name] is a 84 y.o. female with multiple medical problems documented below the presents to the emergency department today secondary to chest pain.  Patient states she thinks she is in withdrawal she took Dilaudid this morning and has not had any since then and has had acute onset of mild chest pain that is pleuritic in nature worse when she takes a deep breath.  States that she has known "fluid around my heart" but does not elaborate further on that.  Said no fevers or cough.  She perseverates on not having her Dilaudid.  In previous records it appears that she has ran out of her Dilaudid before and or so that she misplaced her prescriptions.  She states to try to wean her off of it but do not see any notes state in the same.  She does have history of having chronic chest pain and shortness of breath. No other associated or modifying symptoms.    Past Medical History:  Diagnosis Date  . Aneurysm ([place])    Apical; previously on Coumadin  . Anxiety disorder   . Cancer (HCC)    Skin  . Chronic renal insufficiency    Creatinine 1.17 January 2010  . Coronary artery disease    Cath 02/15/12 revealed RCA as culprit lesion w/ occlusion at site of prior stent, patent LAD stent, chronic LCx and Diagonal dz; Medical therapy   . Depression   . GERD (gastroesophageal reflux disease)   . Hyperlipidemia    H/o GI intolerance to Lipitor  . Impingement syndrome of left shoulder   . Ischemic cardiomyopathy    s/p Medtronic BiV ICD 2006  . Peripheral polyneuropathy    Refractory to several medications  . Systolic and diastolic CHF, chronic (HCC)    echo 03/2012 EF 01-60%, mod diastolic dysfunction, mod MR, mod LAE, mod-severe TR, PASP 5mmHg  . Type 2 diabetes mellitus [facility])     Patient Active Problem List   Diagnosis Date Noted  . Elevated troponin   . Chronic pain syndrome 06/05/2015  . Thrombocytopenia ([place]) 06/05/2015  . Weakness generalized 06/04/2015  . Elevated troponin I level 06/04/2015  . Automatic implantable cardiac defibrillator in situ 07/22/2013  . Acute on chronic systolic heart failure ([place]) 11/24/2012  . Acute on chronic renal failure ([place]) 11/24/2012  . Hypoxemia 04/02/2012  . Stage III chronic kidney disease ([place]) 04/02/2012  . Statin intolerance   . Biventricular implantable cardioverter-defibrillator in situ   . Coronary artery disease   . Ischemic cardiomyopathy   . Diabetes mellitus with neuropathy ([place]) 02/14/2011  . Coronary atherosclerosis of native coronary artery 07/03/2009  . HLD (hyperlipidemia) 04/12/2009  . SYSTOLIC HEART FAILURE, CHRONIC 04/12/2009    Past Surgical History:  Procedure Laterality Date  . ABDOMINAL HYSTERECTOMY    . CARDIAC DEFIBRILLATOR PLACEMENT  05/28/2009   Medtronic ICD placed by Dr. Caryl Comes secondary to ICM/CHF, [id number] F[place] lead fracture with ICD storm 2010 requiring new RV lead placement  . CHOLECYSTECTOMY    . EYE SURGERY    . IMPLANTABLE CARDIOVERTER DEFIBRILLATOR GENERATOR CHANGE  09/26/13   BiV ICD generator change by Dr Rayann Heman - MDT [place] XT CRT-D  . IMPLANTABLE CARDIOVERTER DEFIBRILLATOR GENERATOR CHANGE N/A 09/26/2013   Procedure: IMPLANTABLE CARDIOVERTER DEFIBRILLATOR GENERATOR CHANGE;  Surgeon: Coralyn Mark, MD;  Location:  [facility] CATH LAB;  Service: Cardiovascular;  Laterality: N/A;  . LEFT HEART CATHETERIZATION WITH CORONARY ANGIOGRAM N/A 02/15/2012   Procedure: LEFT HEART CATHETERIZATION WITH CORONARY ANGIOGRAM;  Surgeon: Peter M Martinique, MD;  Location: [facility] CATH LAB;  Service: Cardiovascular;  Laterality: N/A;  . RIGHT HEART CATHETERIZATION N/A 04/27/2012   Procedure: RIGHT HEART CATH;  Surgeon: Larey Dresser, MD;  Location: [facility] CATH LAB;  Service: Cardiovascular;  Laterality: N/A;    Current Outpatient Rx  . Order #:  [id number] Class: Historical Med  . Order #: [id number] Class: Historical Med  . Order #: [id number] Class: Historical Med  . Order #: [id number] Class: Historical Med  . Order #: [id number] Class: No Print  . Order #: [id number] Class: Normal  . Order #: [id number] Class: Historical Med  . Order #: [id number] Class: Historical Med  . Order #: [id number] Class: Historical Med  . Order #: [id number] Class: Historical Med  . Order #: [id number] Class: Historical Med  . Order #: [id number] Class: Historical Med  . Order #: [id number] Class: Normal  . Order #: [id number] Class: Normal  . Order #: [id number] Class: Historical Med  . Order #: [id number] Class: Historical Med  . Order #: [id number] Class: Normal    Allergies Augmentin [amoxicillin-pot clavulanate]; Metformin; Metolazone; and Sulfonamide derivatives  Family History  Problem Relation Age of Onset  . Heart attack Father   . Heart disease Sister   . Heart disease Brother   . Coronary artery disease Neg Hx     Social History Social History   Tobacco Use  . Smoking status: Former Smoker    Packs/day: 0.80    Years: 30.00    Pack years: 24.00    Types: Cigarettes    Start date: 04/05/1957    Last attempt to quit: 06/27/2002    Years since quitting: 15.4  . Smokeless tobacco: Never Used  Substance Use Topics  . Alcohol use: No    Alcohol/week: 0.0 oz  . Drug use: No    Review of Systems  All other systems negative except as documented in the HPI. All pertinent positives and negatives as reviewed in the HPI. ____________________________________________   PHYSICAL EXAM:  VITAL SIGNS: ED Triage Vitals  Enc Vitals Group     BP 11/27/17 2024 (!) 146/69     Pulse Rate 11/27/17 2024 74     Resp 11/27/17 2024 16     Temp 11/27/17 2024 97.6 F (36.4 C)     Temp src --      SpO2 11/27/17 2024 100 %     Weight 11/27/17 2023 165 lb (74.8 kg)     Height 11/27/17 2023 5\' 5"  (1.651 m)    Constitutional: Alert and oriented. Well appearing and in  no acute distress. Eyes: Conjunctivae are normal. PERRL. EOMI. Head: Atraumatic. Nose: No congestion/rhinnorhea. Mouth/Throat: Mucous membranes are moist.  Oropharynx non-erythematous. Neck: No stridor.  No meningeal signs.   Cardiovascular: Normal rate, regular rhythm. Good peripheral circulation. Grossly normal heart sounds.   Respiratory: Normal respiratory effort.  No retractions. Lungs mild crackles. Gastrointestinal: Soft and nontender. No distention.  Musculoskeletal: No lower extremity tenderness nor edema. No gross deformities of extremities. Neurologic:  Normal speech and language. No gross focal neurologic deficits are appreciated.  Skin:  Skin is warm, dry and intact. No rash noted.   ____________________________________________   LABS (all labs ordered are listed, but only abnormal results are displayed)  Labs Reviewed  CBC WITH DIFFERENTIAL/PLATELET - Abnormal; Notable for the following components:      Result Value   RBC 3.83 (*)  Hemoglobin 10.2 (*)    HCT 34.1 (*)    MCHC 29.9 (*)    RDW 15.7 (*)    All other components within normal limits  COMPREHENSIVE METABOLIC PANEL - Abnormal; Notable for the following components:   Sodium 132 (*)    Chloride 90 (*)    Glucose, Bld 177 (*)    Creatinine, Ser 1.20 (*)    Total Bilirubin 1.8 (*)    GFR calc non Af Amer 41 (*)    GFR calc Af Amer 48 (*)    All other components within normal limits  TROPONIN I - Abnormal; Notable for the following components:   Troponin I 0.05 (*)    All other components within normal limits  BRAIN NATRIURETIC PEPTIDE - Abnormal; Notable for the following components:   B Natriuretic Peptide 1,938.0 (*)    All other components within normal limits   ____________________________________________  EKG   EKG Interpretation  Date/Time:  Monday November 27 2017 20:27:31 EDT Ventricular Rate:  75 PR Interval:    QRS Duration: 146 QT Interval:  460 QTC Calculation: 582 R  Axis:   125 Text Interpretation:  Atrial-sensed ventricular-paced complexes No further analysis attempted due to paced rhythm Baseline wander in lead(s) V6 No significant change since last tracing Confirmed by Merrily Pew [phone]) on 11/27/2017 9:07:03 PM       ____________________________________________  RADIOLOGY  Dg Chest 2 View  Result Date: 11/27/2017 CLINICAL DATA:  Chest pain and shortness of breath. EXAM: CHEST - 2 VIEW COMPARISON:  Radiograph 07-08-2017 FINDINGS: Cardiomegaly and mediastinal contours are unchanged. Left-sided pacemaker with unchanged leads, abandoned pacer wires also unchanged. Vascular congestion with mild progression from prior. No pleural effusion or confluent airspace disease. No pneumothorax. IMPRESSION: Slight progression of vascular congestion from prior exam. Cardiomegaly is stable. Electronically Signed   By: Jeb Levering M.D.   On: 11/27/2017 21:48    ____________________________________________   PROCEDURES  Procedure(s) performed:   Procedures   ____________________________________________   INITIAL IMPRESSION / ASSESSMENT AND PLAN / ED COURSE  Rule out ACS or other acute emergent pathology. One dose of home pain medication to see if it helps. otherwise discharge to follow up with pcp/cardiologist as scheduled.  Found to have fluid overload worse than baseline. This in combination with her CP and h/o CHF, will discuss with hospitalist regarding admission for same.   Pertinent labs & imaging results that were available during my care of the patient were reviewed by me and considered in my medical decision making (see chart for details).  ____________________________________________  FINAL CLINICAL IMPRESSION(S) / ED DIAGNOSES  Final diagnoses:  Chest pain, unspecified type  Acute on chronic congestive heart failure, unspecified heart failure type [facility])     MEDICATIONS GIVEN DURING THIS VISIT:  Medications  HYDROmorphone  (DILAUDID) tablet 4 mg (4 mg Oral Given 11/27/17 2113)  aspirin chewable tablet 324 mg (324 mg Oral Given 11/27/17 2112)  furosemide (LASIX) injection 80 mg (80 mg Intravenous Given 11/27/17 2305)     NEW OUTPATIENT MEDICATIONS STARTED DURING THIS VISIT:  New Prescriptions   No medications on file    Note:  This note was prepared with assistance of Dragon voice recognition software. Occasional wrong-word or sound-a-like substitutions may have occurred due to the inherent limitations of voice recognition software.   Merrily Pew, MD 11/27/17 [phone]

## 2017-11-28 ENCOUNTER — Encounter (HOSPITAL_COMMUNITY): Payer: Self-pay

## 2017-11-28 LAB — COMPREHENSIVE METABOLIC PANEL
ALK PHOS: 85 U/L (ref 38–126)
ALT: 14 U/L (ref 14–54)
AST: 22 U/L (ref 15–41)
Albumin: 3.6 g/dL (ref 3.5–5.0)
Anion gap: 8 (ref 5–15)
BUN: 19 mg/dL (ref 6–20)
CALCIUM: 9.2 mg/dL (ref 8.9–10.3)
CHLORIDE: 93 mmol/L — AB (ref 101–111)
CO2: 36 mmol/L — ABNORMAL HIGH (ref 22–32)
CREATININE: 1.09 mg/dL — AB (ref 0.44–1.00)
GFR calc non Af Amer: 46 mL/min — ABNORMAL LOW (ref >60)
GFR, EST AFRICAN AMERICAN: 54 mL/min — AB (ref >60)
Glucose, Bld: 151 mg/dL — ABNORMAL HIGH (ref 65–99)
Potassium: 3.6 mmol/L (ref 3.5–5.1)
SODIUM: 137 mmol/L (ref 135–145)
Total Bilirubin: 1.6 mg/dL — ABNORMAL HIGH (ref 0.3–1.2)
Total Protein: 7.4 g/dL (ref 6.5–8.1)

## 2017-11-28 LAB — HEMOGLOBIN A1C
Hgb A1c MFr Bld: 6.2 % — ABNORMAL HIGH (ref 4.8–5.6)
MEAN PLASMA GLUCOSE: 131.24 mg/dL

## 2017-11-28 LAB — CBC
HCT: 35.1 % — ABNORMAL LOW (ref 36.0–46.0)
HEMOGLOBIN: 10.5 g/dL — AB (ref 12.0–15.0)
MCH: 26.6 pg (ref 26.0–34.0)
MCHC: 29.9 g/dL — ABNORMAL LOW (ref 30.0–36.0)
MCV: 89.1 fL (ref 78.0–100.0)
PLATELETS: 225 10*3/uL (ref 150–400)
RBC: 3.94 MIL/uL (ref 3.87–5.11)
RDW: 15.7 % — ABNORMAL HIGH (ref 11.5–15.5)
WBC: 5.5 10*3/uL (ref 4.0–10.5)

## 2017-11-28 LAB — TROPONIN I
TROPONIN I: 0.05 ng/mL — AB (ref <0.03)
TROPONIN I: 0.06 ng/mL — AB (ref <0.03)
Troponin I: 0.05 ng/mL (ref <0.03)

## 2017-11-28 LAB — MRSA PCR SCREENING: MRSA by PCR: NEGATIVE

## 2017-11-28 LAB — GLUCOSE, CAPILLARY
GLUCOSE-CAPILLARY: 143 mg/dL — AB (ref 65–99)
Glucose-Capillary: 167 mg/dL — ABNORMAL HIGH (ref 65–99)
Glucose-Capillary: 164 mg/dL — ABNORMAL HIGH (ref 65–99)
Glucose-Capillary: 191 mg/dL — ABNORMAL HIGH (ref 65–99)

## 2017-11-28 MED ORDER — ASPIRIN 81 MG PO CHEW
81.0000 mg | CHEWABLE_TABLET | Freq: Every day | ORAL | Status: DC
  Administered 2017-11-28 – 2017-11-29 (×2): 81 mg via ORAL
  Filled 2017-11-28 (×2): qty 1

## 2017-11-28 MED ORDER — ENSURE ENLIVE PO LIQD
237.0000 mL | Freq: Two times a day (BID) | ORAL | Status: DC
  Administered 2017-11-28: 237 mL via ORAL

## 2017-11-28 MED ORDER — ENOXAPARIN SODIUM 40 MG/0.4ML ~~LOC~~ SOLN
40.0000 mg | SUBCUTANEOUS | Status: DC
  Administered 2017-11-28 (×2): 40 mg via SUBCUTANEOUS
  Filled 2017-11-28 (×2): qty 0.4

## 2017-11-28 MED ORDER — ALBUTEROL SULFATE 0.63 MG/3ML IN NEBU: 1.0000 | INHALATION_SOLUTION | Freq: Four times a day (QID) | RESPIRATORY_TRACT | Status: DC | PRN

## 2017-11-28 MED ORDER — CARVEDILOL 3.125 MG PO TABS
3.1250 mg | ORAL_TABLET | Freq: Two times a day (BID) | ORAL | Status: DC
  Administered 2017-11-28 – 2017-11-29 (×3): 3.125 mg via ORAL
  Filled 2017-11-28 (×3): qty 1

## 2017-11-28 MED ORDER — SODIUM CHLORIDE 0.9 % IV SOLN: 250.0000 mL | INTRAVENOUS | Status: DC | PRN

## 2017-11-28 MED ORDER — IPRATROPIUM-ALBUTEROL 0.5-2.5 (3) MG/3ML IN SOLN
3.0000 mL | Freq: Four times a day (QID) | RESPIRATORY_TRACT | Status: DC
  Administered 2017-11-28 (×4): 3 mL via RESPIRATORY_TRACT
  Filled 2017-11-28 (×4): qty 3

## 2017-11-28 MED ORDER — IPRATROPIUM-ALBUTEROL 0.5-2.5 (3) MG/3ML IN SOLN: 3.0000 mL | Freq: Four times a day (QID) | RESPIRATORY_TRACT | Status: DC | PRN

## 2017-11-28 MED ORDER — INSULIN ASPART 100 UNIT/ML ~~LOC~~ SOLN
0.0000 [IU] | Freq: Three times a day (TID) | SUBCUTANEOUS | Status: DC
  Administered 2017-11-28: 1 [IU] via SUBCUTANEOUS
  Administered 2017-11-28 – 2017-11-29 (×4): 2 [IU] via SUBCUTANEOUS

## 2017-11-28 MED ORDER — ALPRAZOLAM 0.5 MG PO TABS
0.5000 mg | ORAL_TABLET | Freq: Every day | ORAL | Status: DC
  Administered 2017-11-28 (×2): 0.5 mg via ORAL
  Filled 2017-11-28 (×2): qty 1

## 2017-11-28 MED ORDER — ALBUTEROL SULFATE (2.5 MG/3ML) 0.083% IN NEBU: 2.5000 mg | INHALATION_SOLUTION | Freq: Four times a day (QID) | RESPIRATORY_TRACT | Status: DC

## 2017-11-28 MED ORDER — INSULIN GLARGINE 100 UNIT/ML ~~LOC~~ SOLN
20.0000 [IU] | Freq: Two times a day (BID) | SUBCUTANEOUS | Status: DC
  Administered 2017-11-28 – 2017-11-29 (×4): 20 [IU] via SUBCUTANEOUS
  Filled 2017-11-28 (×6): qty 0.2

## 2017-11-28 MED ORDER — ORAL CARE MOUTH RINSE: 15.0000 mL | Freq: Two times a day (BID) | OROMUCOSAL | Status: DC

## 2017-11-28 MED ORDER — ACETAMINOPHEN 325 MG PO TABS
650.0000 mg | ORAL_TABLET | Freq: Four times a day (QID) | ORAL | Status: DC | PRN
  Administered 2017-11-29: 650 mg via ORAL
  Filled 2017-11-28: qty 2

## 2017-11-28 MED ORDER — SODIUM CHLORIDE 0.9% FLUSH
3.0000 mL | Freq: Two times a day (BID) | INTRAVENOUS | Status: DC
  Administered 2017-11-28: 3 mL via INTRAVENOUS
  Administered 2017-11-28: 10 mL via INTRAVENOUS
  Administered 2017-11-28 – 2017-11-29 (×2): 3 mL via INTRAVENOUS

## 2017-11-28 MED ORDER — HYDROMORPHONE HCL 4 MG PO TABS
4.0000 mg | ORAL_TABLET | Freq: Three times a day (TID) | ORAL | Status: DC | PRN
  Administered 2017-11-28 – 2017-11-29 (×4): 4 mg via ORAL
  Filled 2017-11-28: qty 1
  Filled 2017-11-28: qty 2
  Filled 2017-11-28: qty 1
  Filled 2017-11-28: qty 2

## 2017-11-28 MED ORDER — CITALOPRAM HYDROBROMIDE 20 MG PO TABS
20.0000 mg | ORAL_TABLET | Freq: Every day | ORAL | Status: DC
  Administered 2017-11-28 (×2): 20 mg via ORAL
  Filled 2017-11-28 (×2): qty 1

## 2017-11-28 MED ORDER — ALLOPURINOL 100 MG PO TABS
200.0000 mg | ORAL_TABLET | Freq: Every day | ORAL | Status: DC
  Administered 2017-11-28 – 2017-11-29 (×2): 200 mg via ORAL
  Filled 2017-11-28 (×4): qty 2

## 2017-11-28 MED ORDER — ACETAMINOPHEN 650 MG RE SUPP: 650.0000 mg | Freq: Four times a day (QID) | RECTAL | Status: DC | PRN

## 2017-11-28 MED ORDER — IPRATROPIUM-ALBUTEROL 0.5-2.5 (3) MG/3ML IN SOLN
3.0000 mL | Freq: Three times a day (TID) | RESPIRATORY_TRACT | Status: DC
  Filled 2017-11-28: qty 3

## 2017-11-28 MED ORDER — GLUCERNA SHAKE PO LIQD
237.0000 mL | Freq: Two times a day (BID) | ORAL | Status: DC
  Administered 2017-11-28 – 2017-11-29 (×2): 237 mL via ORAL

## 2017-11-28 MED ORDER — SODIUM CHLORIDE 0.9% FLUSH: 3.0000 mL | INTRAVENOUS | Status: DC | PRN

## 2017-11-28 MED ORDER — COLCHICINE 0.6 MG PO TABS
0.6000 mg | ORAL_TABLET | Freq: Every day | ORAL | Status: DC | PRN
  Filled 2017-11-28: qty 1

## 2017-11-28 MED ORDER — VITAMIN B-12 1000 MCG PO TABS
2000.0000 ug | ORAL_TABLET | Freq: Every day | ORAL | Status: DC
  Administered 2017-11-28 – 2017-11-29 (×2): 2000 ug via ORAL
  Filled 2017-11-28 (×2): qty 2

## 2017-11-28 MED ORDER — IPRATROPIUM BROMIDE 0.02 % IN SOLN: 0.5000 mg | Freq: Four times a day (QID) | RESPIRATORY_TRACT | Status: DC

## 2017-11-28 MED ORDER — FUROSEMIDE 10 MG/ML IJ SOLN
40.0000 mg | Freq: Two times a day (BID) | INTRAMUSCULAR | Status: DC
  Administered 2017-11-28 (×2): 40 mg via INTRAVENOUS
  Filled 2017-11-28 (×2): qty 4

## 2017-11-28 NOTE — Progress Notes (Signed)
Initial Nutrition Assessment  DOCUMENTATION CODES:   Not applicable  INTERVENTION:   Monitor PO intake to reach 90%+ of needs.  Will order Glucerna po BID, each supplement provides 220 kcal and 10 grams of protein  Will d/c Ensure Enlive.  NUTRITION DIAGNOSIS:   Inadequate oral intake related to decreased appetite as evidenced by per patient/family report and recorded intake of 50% meal completion.  GOAL:   Patient will meet greater than or equal to 90% of their needs  MONITOR:   PO intake, Supplement acceptance, Weight trends  REASON FOR ASSESSMENT:   Malnutrition Screening Tool   ASSESSMENT:   84 y/o female with extensive medical hx including CAD, GERD, HLD, CKD3, CHF, and DM2.  Admitted for chest pain and SOB.  MST score of 2.  Pt reports appetite has been good before a couple weeks ago.  Someone else picks up her groceries, but she does her own food preparation.  Her typical diet includes: B-cereal or strudel with coffee (w/flavored creamer), L-sandwich with fruit cups (no sugar added), S-soup or chicken/strips or fish.  Sometimes her family brings over homemade dishes which she has for supper.  She drinks water with sugar free water flavoring.  Pt mentioned she watches her salt intake and checks her blood sugar/takes insulin regularly.  Typical morning BG lvl: 100.  At home, pt takes Boost supplements, an "orange liquid" for her chronic constipation, vit. D, calcium, and "all different kinds" of vitamins (but not a MVI).    When asked about her wt loss (MST 2), pt started talking about the fluid around her heart instead.  When RD intern rephrased wt loss question, pt said a "nurse told her", but did confirm a UBW in the 160s.  Recorded wts over the last two years have been stable (161 lbs to 168 lbs, current 165 lbs).  Pt did confirm decreased appetite over the past "couple" weeks.  A NFPE was performed and found only a few areas that showed any signs of malnutrition, all of  which were mainly mild/mod.  RD intern suspects this may be due to sarcopenia rather than any real malnutrition.  Currently pt's recorded intake is at 50%.  RD intern noticed an empty styrofoam container from kitchen in pt's room and pt said she had eaten all her toast for breakfast, but she didn't like things like grits or pancakes.  Pt was also being given Ensure when RD intern was leaving ("likes chocolate"), to which records show pt accepted.    Pt seems to be open to eating, perhaps just selective with likes/dislikes.  Interventions include monitoring intake level and change enlive for glucerna because of elevated glucose levels and lower protein threshold due to CKD3.      Labs reviewed: Chloride 93, CO2 36, Creatinine 1.09, Glucose 151 Recent Labs  Lab 11/27/17 2127 11/28/17 0642  NA 132* 137  K 3.8 3.6  CL 90* 93*  CO2 32 36*  BUN 20 19  CREATININE 1.20* 1.09*  CALCIUM 9.1 9.2  GLUCOSE 177* 151*   Medications: Zyloprim, coreg, lovenox, insulin, vit. B12, saline, dilaudid   Past Medical History:  Diagnosis Date  . Aneurysm ([place])    Apical; previously on Coumadin  . Anxiety disorder   . Cancer (HCC)    Skin  . Chronic renal insufficiency    Creatinine 1.17 January 2010  . Coronary artery disease    Cath 02/15/12 revealed RCA as culprit lesion w/ occlusion at site of prior stent, patent LAD  stent, chronic LCx and Diagonal dz; Medical therapy   . Depression   . GERD (gastroesophageal reflux disease)   . Hyperlipidemia    H/o GI intolerance to Lipitor  . Impingement syndrome of left shoulder   . Ischemic cardiomyopathy    s/p Medtronic BiV ICD 2006  . Peripheral polyneuropathy    Refractory to several medications  . Systolic and diastolic CHF, chronic (HCC)    echo 03/2012 EF 47-42%, mod diastolic dysfunction, mod MR, mod LAE, mod-severe TR, PASP 65mmHg  . Type 2 diabetes mellitus (HCC)    NUTRITION - FOCUSED PHYSICAL EXAM:    Most Recent Value  Orbital Region  No  depletion  Upper Arm Region  No depletion  Thoracic and Lumbar Region  No depletion  Buccal Region  No depletion  Temple Region  Moderate depletion  Clavicle Bone Region  No depletion  Clavicle and Acromion Bone Region  No depletion  Dorsal Hand  Mild depletion  Anterior Thigh Region  Moderate depletion  Posterior Calf Region  Mild depletion  Edema (RD Assessment)  None  Skin  Reviewed  Nails  Reviewed     Diet Order:   Diet Order           Diet Carb Modified Fluid consistency: Thin; Room service appropriate? Yes  Diet effective now         EDUCATION NEEDS:   No education needs have been identified at this time  Skin:  Skin Assessment: Skin Integrity Issues: Skin Integrity Issues:: Other (Comment) Other: Ecchymosis on left ear due to fall.  Last BM:  6/3  Height:   Ht Readings from Last 1 Encounters:  11/28/17 5\' 5"  (1.651 m)   Weight:   Wt Readings from Last 1 Encounters:  11/28/17 165 lb (74.8 kg)   Ideal Body Weight:  56.7 kg  BMI:  Body mass index is 27.46 kg/m.  Estimated Nutritional Needs:   Kcal:  1530-1700 kcal (27-30 kcal/kg IBW)  Protein:  60-67 g (0.8-0.9 g/kg BW) (CKD3 w/DM2) Fluid:  <2000 mL (CHF)

## 2017-11-28 NOTE — Progress Notes (Signed)
PROGRESS NOTE    [name]  NIO:[id number] DOB: 23-Sep-1933 DOA: 11/27/2017 PCP: Loman Brooklyn, FNP    Brief Narrative:  84 year old female with a history of chronic respiratory failure on home oxygen, chronic combined CHF, admitted to the hospital with shortness of breath and found to have decompensated CHF.  Admitted to the hospital for intravenous diuresis.   Assessment & Plan:   Active Problems:   HLD (hyperlipidemia)   Diabetes mellitus with neuropathy (HCC)   Acute on chronic systolic heart failure (HCC)   Elevated troponin I level   CHF exacerbation (HCC)   1. Acute on chronic combined CHF.  Echocardiogram from 02/2017 shows EF 45% with grade 1 diastolic dysfunction.  She presented with volume overload.  Started on IV Lasix.  Clinically she is improving.  Continue current treatments.  Still has evidence of volume overload.  Continue beta-blocker. 2. Diabetes.  Continued on home dose of Lantus and sliding scale insulin.  Blood sugar stable.  Continue to monitor. 3. Chest pain.  Resolved after patient received Lasix.  She has mild elevation of troponin, which appears to be flat.  Unlikely related to ACS. 4. COPD.  Appears stable.  Continue  bronchodilators.  She is not wheezing at this time. 5. Chronic pain syndrome.  Continue on home dose of Dilaudid. 6. Coronary artery disease.  Stable.  Continue aspirin and Coreg.   DVT prophylaxis: lovenox  Code Status: DNR Family Communication: no family present Disposition Plan: discharge home once adequately diuresed   Consultants:     Procedures:     Antimicrobials:       Subjective: Feeling better. Shortness of breath improving, but not back to baseline. No chest pain  Objective: Vitals:   11/28/17 1100 11/28/17 1200 11/28/17 1300 11/28/17 1400  BP: (!) 112/45 (!) 116/91 (!) 126/54 114/63  Pulse: (!) 59 65 60 65  Resp: 17 20 20 17   Temp:      TempSrc:      SpO2: 100% 100% 99% 100%  Weight:        Height:        Intake/Output Summary (Last 24 hours) at 11/28/2017 1626 Last data filed at 11/28/2017 1300 Gross per 24 hour  Intake 840 ml  Output 901 ml  Net -61 ml   Filed Weights   11/27/17 2023 11/28/17 0149 11/28/17 0211  Weight: 74.8 kg (165 lb) 74.8 kg (165 lb) 74.8 kg (165 lb)    Examination:  General exam: Appears calm and comfortable  Respiratory system: bilateral crackles. Respiratory effort normal. Cardiovascular system: S1 & S2 heard, RRR. No JVD, murmurs, rubs, gallops or clicks. 1+ pedal edema. Gastrointestinal system: Abdomen is nondistended, soft and nontender. No organomegaly or masses felt. Normal bowel sounds heard. Central nervous system: Alert and oriented. No focal neurological deficits. Extremities: Symmetric 5 x 5 power. Skin: No rashes, lesions or ulcers Psychiatry: Judgement and insight appear normal. Mood & affect appropriate.     Data Reviewed: I have personally reviewed following labs and imaging studies  CBC: Recent Labs  Lab 11/27/17 2127 11/28/17 0642  WBC 5.1 5.5  NEUTROABS 3.7  --   HGB 10.2* 10.5*  HCT 34.1* 35.1*  MCV 89.0 89.1  PLT 208 818   Basic Metabolic Panel: Recent Labs  Lab 11/27/17 2127 11/28/17 0642  NA 132* 137  K 3.8 3.6  CL 90* 93*  CO2 32 36*  GLUCOSE 177* 151*  BUN 20 19  CREATININE 1.20* 1.09*  CALCIUM 9.1 9.2  GFR: Estimated Creatinine Clearance: 41 mL/min (A) (by C-G formula based on SCr of 1.09 mg/dL (H)). Liver Function Tests: Recent Labs  Lab 11/27/17 2127 11/28/17 0642  AST 25 22  ALT 15 14  ALKPHOS 88 85  BILITOT 1.8* 1.6*  PROT 7.6 7.4  ALBUMIN 3.8 3.6   No results for input(s): LIPASE, AMYLASE in the last 168 hours. No results for input(s): AMMONIA in the last 168 hours. Coagulation Profile: No results for input(s): INR, PROTIME in the last 168 hours. Cardiac Enzymes: Recent Labs  Lab 11/27/17 2127 11/28/17 0123 11/28/17 0642 11/28/17 1251  TROPONINI 0.05* 0.05* 0.06* 0.05*    BNP (last 3 results) No results for input(s): PROBNP in the last 8760 hours. HbA1C: No results for input(s): HGBA1C in the last 72 hours. CBG: Recent Labs  Lab 11/28/17 0745 11/28/17 1141  GLUCAP 143* 167*   Lipid Profile: No results for input(s): CHOL, HDL, LDLCALC, TRIG, CHOLHDL, LDLDIRECT in the last 72 hours. Thyroid Function Tests: No results for input(s): TSH, T4TOTAL, FREET4, T3FREE, THYROIDAB in the last 72 hours. Anemia Panel: No results for input(s): VITAMINB12, FOLATE, FERRITIN, TIBC, IRON, RETICCTPCT in the last 72 hours. Sepsis Labs: No results for input(s): PROCALCITON, LATICACIDVEN in the last 168 hours.  Recent Results (from the past 240 hour(s))  MRSA PCR Screening     Status: None   Collection Time: 11/28/17 12:58 AM  Result Value Ref Range Status   MRSA by PCR NEGATIVE NEGATIVE Final    Comment:        The GeneXpert MRSA Assay (FDA approved for NASAL specimens only), is one component of a comprehensive MRSA colonization surveillance program. It is not intended to diagnose MRSA infection nor to guide or monitor treatment for MRSA infections. Performed at [facility], [address] [place], [place] [address]          Radiology Studies: Dg Chest 2 View  Result Date: 11/27/2017 CLINICAL DATA:  Chest pain and shortness of breath. EXAM: CHEST - 2 VIEW COMPARISON:  Radiograph 10/05/202019 FINDINGS: Cardiomegaly and mediastinal contours are unchanged. Left-sided pacemaker with unchanged leads, abandoned pacer wires also unchanged. Vascular congestion with mild progression from prior. No pleural effusion or confluent airspace disease. No pneumothorax. IMPRESSION: Slight progression of vascular congestion from prior exam. Cardiomegaly is stable. Electronically Signed   By: Jeb Levering M.D.   On: 11/27/2017 21:48        Scheduled Meds: . allopurinol  200 mg Oral Daily  . ALPRAZolam  0.5 mg Oral QHS  . aspirin  81 mg Oral Daily  . carvedilol   3.125 mg Oral BID WC  . citalopram  20 mg Oral QHS  . enoxaparin (LOVENOX) injection  40 mg Subcutaneous Q24H  . feeding supplement (GLUCERNA SHAKE)  237 mL Oral BID BM  . furosemide  40 mg Intravenous Q12H  . insulin aspart  0-9 Units Subcutaneous TID WC  . insulin glargine  20 Units Subcutaneous BID  . ipratropium-albuterol  3 mL Nebulization Q6H  . mouth rinse  15 mL Mouth Rinse BID  . sodium chloride flush  3 mL Intravenous Q12H  . cyanocobalamin  2,000 mcg Oral Daily   Continuous Infusions: . sodium chloride       LOS: 1 day    Time spent: 2mins    Kathie Dike, MD [facility] Pager [phone]  If 7PM-7AM, please contact night-coverage [email or web address] Password [facility] 11/28/2017, 4:26 PM

## 2017-11-29 DIAGNOSIS — I5023 Acute on chronic systolic (congestive) heart failure: Secondary | ICD-10-CM

## 2017-11-29 LAB — BASIC METABOLIC PANEL
Anion gap: 10 (ref 5–15)
BUN: 23 mg/dL — AB (ref 6–20)
CHLORIDE: 91 mmol/L — AB (ref 101–111)
CO2: 34 mmol/L — AB (ref 22–32)
CREATININE: 1.21 mg/dL — AB (ref 0.44–1.00)
Calcium: 9.6 mg/dL (ref 8.9–10.3)
GFR calc Af Amer: 47 mL/min — ABNORMAL LOW (ref >60)
GFR calc non Af Amer: 41 mL/min — ABNORMAL LOW (ref >60)
GLUCOSE: 125 mg/dL — AB (ref 65–99)
POTASSIUM: 3.8 mmol/L (ref 3.5–5.1)
Sodium: 135 mmol/L (ref 135–145)

## 2017-11-29 LAB — GLUCOSE, CAPILLARY
Glucose-Capillary: 159 mg/dL — ABNORMAL HIGH (ref 65–99)
Glucose-Capillary: 183 mg/dL — ABNORMAL HIGH (ref 65–99)

## 2017-11-29 MED ORDER — HYDROMORPHONE HCL 4 MG PO TABS: 4.0000 mg | ORAL_TABLET | Freq: Three times a day (TID) | ORAL | 0 refills | Status: AC | PRN

## 2017-11-29 MED ORDER — TORSEMIDE 20 MG PO TABS: 40.0000 mg | ORAL_TABLET | Freq: Two times a day (BID) | ORAL | 6 refills | Status: DC

## 2017-11-29 NOTE — Evaluation (Signed)
Physical Therapy Evaluation Patient Details Name: [name] MRN: [id number] DOB: [birth year] Today's Date: 11/29/2017   History of Present Illness  [name]  is a 84 y.o. female With history of chronic respiratory failure on home O2, chronic systolic and diastolic CHF GERD, CAD, status post stent placement, hyperlipidemia, ischemic cardiomyopathy status post biventricular ICD, COPD, diabetes mellitus who came to hospital with worsening shortness of breath.  Patient says that she is trying to wean off her Dilaudid which she was taking for chronic pain syndrome.  She did complain of chest pain today    Clinical Impression  Patient limited for functional mobility as stated below secondary to BLE weakness, fatigue and fair/poor standing balance.  Patient will benefit from continued physical therapy in hospital and recommended venue below to increase strength, balance, endurance for safe ADLs and gait.    Follow Up Recommendations Home health PT;Supervision - Intermittent    Equipment Recommendations  None recommended by PT    Recommendations for Other Services       Precautions / Restrictions Precautions Precautions: Fall Restrictions Weight Bearing Restrictions: No      Mobility  Bed Mobility Overal bed mobility: Modified Independent                Transfers Overall transfer level: Needs assistance Equipment used: Rolling walker (2 wheeled) Transfers: Sit to/from [employer] Transfers Sit to Stand: Supervision Stand pivot transfers: Supervision       General transfer comment: good return transferring to/from commode in bathroom  Ambulation/Gait Ambulation/Gait assistance: Supervision Ambulation Distance (Feet): 35 Feet Assistive device: Rolling walker (2 wheeled) Gait Pattern/deviations: Decreased step length - right;Decreased step length - left;Decreased stride length Gait velocity: slow   General Gait Details: slightly unsteady slow  labored cadence without loss of balance, limited secondary to fatigue  Stairs            Wheelchair Mobility    Modified Rankin (Stroke Patients Only)       Balance Overall balance assessment: Needs assistance Sitting-balance support: Feet supported;No upper extremity supported Sitting balance-Leahy Scale: Good     Standing balance support: Bilateral upper extremity supported;During functional activity Standing balance-Leahy Scale: Fair                               Pertinent Vitals/Pain Pain Assessment: Faces Pain Location: bilateral knees when bending Pain Descriptors / Indicators: Discomfort Pain Intervention(s): Limited activity within patient's tolerance;Monitored during session    Home Living Family/patient expects to be discharged to:: Private residence Living Arrangements: Alone Available Help at Discharge: Family;Available PRN/intermittently Type of Home: House Home Access: Stairs to enter Entrance Stairs-Rails: Left(going up) Entrance Stairs-Number of Steps: 2 Home Layout: One level Home Equipment: Walker - 4 wheels;Cane - single point;Shower seat;Bedside commode;Wheelchair - manual      Prior Function Level of Independence: Independent with assistive device(s)         Comments: household ambulator with Rollator most of time, SPC occasionally     Hand Dominance        Extremity/Trunk Assessment   Upper Extremity Assessment Upper Extremity Assessment: Overall WFL for tasks assessed    Lower Extremity Assessment Lower Extremity Assessment: Generalized weakness    Cervical / Trunk Assessment Cervical / Trunk Assessment: Normal  Communication   Communication: No difficulties  Cognition Arousal/Alertness: Awake/alert Behavior During Therapy: WFL for tasks assessed/performed Overall Cognitive Status: Within Functional Limits for tasks assessed  General Comments       Exercises     Assessment/Plan    PT Assessment Patient needs continued PT services  PT Problem List Decreased strength;Decreased activity tolerance;Decreased balance;Decreased mobility       PT Treatment Interventions Gait training;Stair training;Functional mobility training;Therapeutic activities;Therapeutic exercise;Patient/family education    PT Goals (Current goals can be found in the Care Plan section)  Acute Rehab PT Goals Patient Stated Goal: return home with family to assist PT Goal Formulation: With patient Time For Goal Achievement: 12/06/17 Potential to Achieve Goals: Good    Frequency Min 3X/week   Barriers to discharge        Co-evaluation               AM-PAC PT "6 Clicks" Daily Activity  Outcome Measure Difficulty turning over in bed (including adjusting bedclothes, sheets and blankets)?: None Difficulty moving from lying on back to sitting on the side of the bed? : None Difficulty sitting down on and standing up from a chair with arms (e.g., wheelchair, bedside commode, etc,.)?: None Help needed moving to and from a bed to chair (including a wheelchair)?: None Help needed walking in hospital room?: A Little Help needed climbing 3-5 steps with a railing? : A Little 6 Click Score: 22    End of Session   Activity Tolerance: Patient tolerated treatment well;Patient limited by fatigue Patient left: in chair;with call bell/phone within reach Nurse Communication: Mobility status PT Visit Diagnosis: Unsteadiness on feet (R26.81);Other abnormalities of gait and mobility (R26.89);Muscle weakness (generalized) (M62.81)    Time: 4403-4742 PT Time Calculation (min) (ACUTE ONLY): 32 min   Charges:   PT Evaluation $PT Eval Moderate Complexity: 1 Mod PT Treatments $Therapeutic Activity: 23-37 mins   PT G Codes:        12:31 PM, 12/04/2017 Lonell Grandchild, MPT Physical Therapist with [facility] [phone] office [phone] mobile  phone

## 2017-11-29 NOTE — Plan of Care (Signed)
  Problem: Acute Rehab PT Goals(only PT should resolve) Goal: Patient Will Transfer Sit To/From Stand Outcome: Progressing Flowsheets (Taken 11/29/2017 1232) Patient will transfer sit to/from stand: with modified independence Goal: Pt Will Ambulate Outcome: Progressing Flowsheets (Taken 11/29/2017 1232) Pt will Ambulate: 50 feet;with modified independence;with rolling walker   12:32 PM, 11/29/17 Lonell Grandchild, MPT Physical Therapist with [facility] [phone] office [phone] mobile phone

## 2017-11-29 NOTE — Care Management Note (Addendum)
Case Management Note  Patient Details  Name: [name] MRN: [id number] Date of Birth: August 19, 1933  Subjective/Objective:     Adm with CHF. From home alone, reports family checks In with her and brings her food. Meals on wheels bring her lunch. She is ind with ADL's. Has home oxygen provided by [employer]. She has cane and RW, uses RW primarily. She reports she has talking scales for weighing and follows a low sodium diet. Seen by PT and recommended for [facility] PT. Patient agreeable, would like [place].      Patient is fixated on her pain prescriptions and concerned it will not be filled as she had one filled recently but has lost "half the bottle". She is unaware if she "hid them from herself".  Will order [facility] RN also to help with disease management and help with managing medications.    Action/Plan: Juliann Pulse of [facility] notified and will obtain orders via Epic. Patient reports brother will pick her up today and bring portable tank for transport.   Expected Discharge Date:  11/29/17               Expected Discharge Plan:  Home/Self Care  In-House Referral:     Discharge planning Services  CM Consult  Post Acute Care Choice:  Home Health Choice offered to:  Patient  DME Arranged:    DME Agency:     HH Arranged:  RN, PT [place] Agency:  [place]  Status of Service:  Completed, signed off  If discussed at [place] of Stay Meetings, dates discussed:    Additional Comments:  Hazel Leveille, Chauncey [place], RN 11/29/2017, 12:19 PM

## 2017-11-29 NOTE — Discharge Summary (Signed)
Physician Discharge Summary  [name] PIR:[id number] DOB: [birth year] DOA: 11/27/2017  PCP: Loman Brooklyn, FNP  Admit date: 11/27/2017  Discharge date: 11/29/2017  Admitted From:Home  Disposition:  Home with home health  Recommendations for Outpatient Follow-up:  1. Follow up with PCP in 1 week  Home Health: Yes with PT  Equipment/Devices:N/A  Discharge Condition:Stable  CODE STATUS: DNR  Diet recommendation: Heart Healthy/Carb Modified  Brief/Interim Summary: 84 year old female with a history of chronic respiratory failure on home oxygen, chronic combined CHF, admitted to the hospital with shortness of breath and found to have decompensated CHF.  Admitted to the hospital for intravenous diuresis.  She has diuresed quite well and has no further shortness of breath.  She has ambulated with her usual home 2 L nasal cannula with no worsening hypoxemia or shortness of breath noted.  She has been recommended for home health physical therapy, however.  She will be sent home with a Dilaudid prescription for chronic lower extremity pain for approximately 7 days until she follows up with her primary care provider.   Discharge Diagnoses:  Active Problems:   HLD (hyperlipidemia)   Diabetes mellitus with neuropathy (HCC)   Acute on chronic systolic heart failure (HCC)   Elevated troponin I level   CHF exacerbation (HCC)  1. Acute on chronic combined CHF-resolved.  Echocardiogram from 02/2017 shows EF 45% with grade 1 diastolic dysfunction.  She presented with volume overload.    She has improved on IV Lasix diuresis and will have her home torsemide dose increased to 40 mg twice daily instead of once daily which is what she is currently taking.  Continue to monitor daily weights and monitor fluid intake.  Follow-up with primary care provider in 1 week. 2. Diabetes.  Continued on home dose of Lantus and sliding scale insulin.  Blood sugar stable.  Continue to monitor. 3. Chest pain.   Resolved after patient received Lasix.  She has mild elevation of troponin, which appears to be flat.  Unlikely related to ACS. 4. COPD.  Appears stable.  Continue  bronchodilators.  She is not wheezing at this time. 5. Chronic pain syndrome.  Continue on home dose of Dilaudid.  Refills given for 7 days until further follow-up. 6. Coronary artery disease.  Stable.  Continue aspirin and Coreg.   Discharge Instructions  Discharge Instructions    Diet - low sodium heart healthy   Complete by:  As directed    Increase activity slowly   Complete by:  As directed      Allergies as of 11/29/2017      Reactions   Augmentin [amoxicillin-pot Clavulanate]    Abdominal pain   Metformin    Upset stomach   Metolazone    "made everything hurt" 07/03/15   Sulfonamide Derivatives    swelling      Medication List    TAKE these medications   albuterol 0.63 MG/3ML nebulizer solution Commonly known as:  ACCUNEB Take 1 ampule by nebulization every 6 (six) hours as needed for wheezing.   allopurinol 100 MG tablet Commonly known as:  ZYLOPRIM Take 200 mg by mouth daily.   ALPRAZolam 0.25 MG tablet Commonly known as:  XANAX Take 0.25 mg by mouth at bedtime as needed for sleep.   aspirin 81 MG chewable tablet Chew 1 tablet (81 mg total) by mouth daily.   carvedilol 3.125 MG tablet Commonly known as:  COREG Take 1 tablet (3.125 mg total) by mouth 2 (two) times daily with  a meal.   citalopram 20 MG tablet Commonly known as:  CELEXA Take 20 mg by mouth daily.   colchicine 0.6 MG tablet Take 0.6 mg by mouth daily as needed (for pan associated with gout flares).   cyanocobalamin 2000 MCG tablet Take 2,000 mcg by mouth daily.   ergocalciferol 50000 units capsule Commonly known as:  VITAMIN D2 Take 50,000 Units by mouth 2 (two) times a week.   HYDROmorphone 4 MG tablet Commonly known as:  DILAUDID Take 1 tablet (4 mg total) by mouth every 8 (eight) hours as needed for up to 7 days for  severe pain. What changed:    when to take this  reasons to take this   insulin glargine 100 unit/mL Sopn Commonly known as:  LANTUS Inject 50 Units into the skin 2 (two) times daily. 50 units in the morning and 50 units in the evening   lactulose 10 GM/15ML solution Commonly known as:  CHRONULAC Take by mouth. 15 mls daily   metolazone 2.5 MG tablet Commonly known as:  ZAROXOLYN TAKE 1 TABLET 2 TIMES A WEEK ON MONDAYS AND FRIDAYS. What changed:  See the new instructions.   nitroGLYCERIN 0.4 MG SL tablet Commonly known as:  NITROSTAT Place 1 tablet (0.4 mg total) under the tongue every 5 (five) minutes as needed for chest pain.   OXYGEN Inhale 2 L into the lungs continuous.   ranitidine 150 MG tablet Commonly known as:  ZANTAC Take 150 mg by mouth daily. At 6am   torsemide 20 MG tablet Commonly known as:  DEMADEX Take 2 tablets (40 mg total) by mouth 2 (two) times daily. What changed:    how much to take  when to take this   Vitamin C 500 MG Caps Take 100 mg by mouth daily.      Follow-up Information    Loman Brooklyn, FNP Follow up in 1 week(s).   Specialty:  Family Medicine Contact information: [place] [address] [phone]        Herminio Commons, MD .   Specialty:  Cardiology Contact information: [place] [address] [phone]        Thompson Grayer, MD .   Specialty:  Cardiology Contact information: [place] [address] [phone]          Allergies  Allergen Reactions  . Augmentin [Amoxicillin-Pot Clavulanate]     Abdominal pain  . Metformin     Upset stomach  . Metolazone     "made everything hurt" 07/03/15  . Sulfonamide Derivatives     swelling    Consultations:  None   Procedures/Studies: Dg Chest 2 View  Result Date: 11/27/2017 CLINICAL DATA:  Chest pain and shortness of breath. EXAM: CHEST - 2 VIEW COMPARISON:  Radiograph 2020/10/1317 FINDINGS:  Cardiomegaly and mediastinal contours are unchanged. Left-sided pacemaker with unchanged leads, abandoned pacer wires also unchanged. Vascular congestion with mild progression from prior. No pleural effusion or confluent airspace disease. No pneumothorax. IMPRESSION: Slight progression of vascular congestion from prior exam. Cardiomegaly is stable. Electronically Signed   By: Jeb Levering M.D.   On: 11/27/2017 21:48   Dg Chest 2 View  Result Date: 2020-01-3118 CLINICAL DATA:  Shortness of breath EXAM: CHEST - 2 VIEW COMPARISON:  11/05/2017 FINDINGS: Cardiac shadow is enlarged. Defibrillator is again noted and stable. Mild vascular congestion is seen with very minimal interstitial edema. No focal infiltrate or sizable effusion is  seen. No bony abnormality is noted. IMPRESSION: Mild vascular congestion. Electronically Signed   By: Inez Catalina M.D.   On: 2020/08/2917 14:10    Discharge Exam: Vitals:   11/28/17 2129 11/29/17 0543  BP: 124/77 126/61  Pulse: 69 88  Resp:    Temp: 98 F (36.7 C) 98.4 F (36.9 C)  SpO2: 100% 100%   Vitals:   11/28/17 1400 11/28/17 2020 11/28/17 2129 11/29/17 0543  BP: 114/63  124/77 126/61  Pulse: 65  69 88  Resp: 17     Temp:   98 F (36.7 C) 98.4 F (36.9 C)  TempSrc:   Oral Oral  SpO2: 100% 98% 100% 100%  Weight:    72.3 kg (159 lb 6.3 oz)  Height:        General: Pt is alert, awake, not in acute distress Cardiovascular: RRR, S1/S2 +, no rubs, no gallops Respiratory: CTA bilaterally, no wheezing, no rhonchi; on 2L [place] Abdominal: Soft, NT, ND, bowel sounds + Extremities: no edema, no cyanosis    The results of significant diagnostics from this hospitalization (including imaging, microbiology, ancillary and laboratory) are listed below for reference.     Microbiology: Recent Results (from the past 240 hour(s))  MRSA PCR Screening     Status: None   Collection Time: 11/28/17 12:58 AM  Result Value Ref Range Status   MRSA by PCR NEGATIVE  NEGATIVE Final    Comment:        The GeneXpert MRSA Assay (FDA approved for NASAL specimens only), is one component of a comprehensive MRSA colonization surveillance program. It is not intended to diagnose MRSA infection nor to guide or monitor treatment for MRSA infections. Performed at [facility], [address], [place], [place] [address]      Labs: BNP (last 3 results) Recent Labs    11/27/17 2127  BNP 4,742.5*   Basic Metabolic Panel: Recent Labs  Lab 11/27/17 2127 11/28/17 0642 11/29/17 0454  NA 132* 137 135  K 3.8 3.6 3.8  CL 90* 93* 91*  CO2 32 36* 34*  GLUCOSE 177* 151* 125*  BUN 20 19 23*  CREATININE 1.20* 1.09* 1.21*  CALCIUM 9.1 9.2 9.6   Liver Function Tests: Recent Labs  Lab 11/27/17 2127 11/28/17 0642  AST 25 22  ALT 15 14  ALKPHOS 88 85  BILITOT 1.8* 1.6*  PROT 7.6 7.4  ALBUMIN 3.8 3.6   No results for input(s): LIPASE, AMYLASE in the last 168 hours. No results for input(s): AMMONIA in the last 168 hours. CBC: Recent Labs  Lab 11/27/17 2127 11/28/17 0642  WBC 5.1 5.5  NEUTROABS 3.7  --   HGB 10.2* 10.5*  HCT 34.1* 35.1*  MCV 89.0 89.1  PLT 208 225   Cardiac Enzymes: Recent Labs  Lab 11/27/17 2127 11/28/17 0123 11/28/17 0642 11/28/17 1251  TROPONINI 0.05* 0.05* 0.06* 0.05*   BNP: Invalid input(s): POCBNP CBG: Recent Labs  Lab 11/28/17 1141 11/28/17 1653 11/28/17 2130 11/29/17 0743 11/29/17 1132  GLUCAP 167* 164* 191* 159* 183*   D-Dimer No results for input(s): DDIMER in the last 72 hours. Hgb A1c Recent Labs    11/28/17 0123  HGBA1C 6.2*   Lipid Profile No results for input(s): CHOL, HDL, LDLCALC, TRIG, CHOLHDL, LDLDIRECT in the last 72 hours. Thyroid function studies No results for input(s): TSH, T4TOTAL, T3FREE, THYROIDAB in the last 72 hours.  Invalid input(s): FREET3 Anemia work up No results for input(s): VITAMINB12, FOLATE, FERRITIN, TIBC, IRON, RETICCTPCT in the  last 72 hours. Urinalysis     Component Value Date/Time   COLORURINE STRAW (A) 2020/08/1117 1524   APPEARANCEUR CLEAR 2020/08/1117 1524   LABSPEC 1.006 2020/08/1117 1524   PHURINE 7.0 2020/08/1117 1524   GLUCOSEU NEGATIVE 2020/08/1117 1524   HGBUR NEGATIVE 2020/08/1117 [place] 2020/08/1117 [place] 2020/08/1117 1524   PROTEINUR 30 (A) 2020/08/1117 1524   NITRITE NEGATIVE 2020/08/1117 1524   LEUKOCYTESUR NEGATIVE 2020/08/1117 1524   Sepsis Labs Invalid input(s): PROCALCITONIN,  WBC,  LACTICIDVEN Microbiology Recent Results (from the past 240 hour(s))  MRSA PCR Screening     Status: None   Collection Time: 11/28/17 12:58 AM  Result Value Ref Range Status   MRSA by PCR NEGATIVE NEGATIVE Final    Comment:        The GeneXpert MRSA Assay (FDA approved for NASAL specimens only), is one component of a comprehensive MRSA colonization surveillance program. It is not intended to diagnose MRSA infection nor to guide or monitor treatment for MRSA infections. Performed at [facility], [address] [place], [place] [address]      Time coordinating discharge: 40 minutes  SIGNED:   Rodena Goldmann, DO [facility] 11/29/2017, 11:55 AM Pager [phone]  If 7PM-7AM, please contact night-coverage [email or web address] Password [email or web address]

## 2017-11-29 NOTE — Progress Notes (Signed)
Discharge instructions reviewed with patient. Given copy of AVS. Pt aware prescriptions have been sent to [facility] in [place] and are ready for pickup. Brother at bedside and states he will pick up prescriptions on the way taking her home. Patient verbalized understanding of instructions and follow-up appointment. IV site d/c'd by nurse tech, site within normal limits. Portable home O2 present for discharge home. Home health arranged with [facility] per CM for home health RN and PT. Patient aware they will call before coming to see her. Patient left floor in stable condition via w/c accompanied by nurse tech. Donavan Foil, RN

## 2017-11-29 NOTE — Progress Notes (Signed)
Patient requested nursing call her pharmacy to make sure her prescriptions had been filled and are ready for pickup before discharge. [facility] states prescriptions are ready for pickup. Pt also requested nursing call her brother Lucendia Herrlich to bring her portable O2 and pick her up for discharge home. States he will be on the way and bring her O2. CM states home health to be arranged with [facility]. Pt okay for discharge. Donavan Foil, RN

## 2017-11-29 NOTE — Progress Notes (Signed)
Patient ambulated with PT. Jeneen Rinks with PT reports patient tolerated well and he will recommend discharge home with home health PT. Pt agreeable to discharge plan. States she feels comfortable going home as long as her pain medication has been filled. States "I lost my pain medication I had refilled. I'm missing half a month's supply and we can't find anywhere. I can't go home without it." discussed with CM. Notified Dr. Manuella Ghazi. States he will provide prescription for Dilaudid for the next few days until patient can follow-up with her PCP. Patient states she uses [facility] in [place] and asked nursing to check with them about filling the new prescription since recent refill has been lost. Spoke with pharmacist at [facility] who stated they should be able to fill the new prescription at discharge. Notified Dr. Manuella Ghazi. Donavan Foil, RN

## 2017-12-04 ENCOUNTER — Ambulatory Visit (INDEPENDENT_AMBULATORY_CARE_PROVIDER_SITE_OTHER): Payer: Medicare Other | Admitting: Internal Medicine

## 2017-12-08 NOTE — Progress Notes (Signed)
No ICM remote transmission received for 12/04/2017 and next ICM transmission scheduled for 12/21/2017.

## 2017-12-12 ENCOUNTER — Ambulatory Visit (INDEPENDENT_AMBULATORY_CARE_PROVIDER_SITE_OTHER): Payer: Medicare Other | Admitting: Internal Medicine

## 2017-12-21 ENCOUNTER — Ambulatory Visit (INDEPENDENT_AMBULATORY_CARE_PROVIDER_SITE_OTHER): Payer: Medicare Other

## 2017-12-21 DIAGNOSIS — I5042 Chronic combined systolic (congestive) and diastolic (congestive) heart failure: Secondary | ICD-10-CM

## 2017-12-21 DIAGNOSIS — Z95810 Presence of automatic (implantable) cardiac defibrillator: Secondary | ICD-10-CM | POA: Diagnosis not present

## 2017-12-21 NOTE — Progress Notes (Signed)
EPIC Encounter for ICM Monitoring  Patient Name: [name] is a 84 y.o. female Date: 12/21/2017 Primary Care Physican: Loman Brooklyn, FNP Primary Cardiologist: Bronson Ing Electrophysiologist: Allred Dry Weight:152lbs  Bi-V Pacing:96.6%                                               Heart Failure questions reviewed, pt reports her family does not help her with her care or transportation or care in the home.  She declines any suggestions to find solutions and has reasons why she can't do them.  She will be seeing PCP this coming week and has qualified for 7 hours a day of personal care service.  Advised to discuss all available options to locate resources with PCP next week.  Asked if she considered an assisted living facility so she will have some help with meals, transportation and care and she does not want to leave her home.  She said she was supposed to have home care at time of discharge and has lost all her discharge papers.  She reported the PCP next week said she would order home care.    Thoracic impedance abnormal suggesting fluid accumulation since 09/08/2017 but showed slight improvement at time of discharge on 6/5  Prescribed dosage: Torsemide 20 mg2tablets (40 mg total)twice a day. Metolazone 2.5 mg one tab by mouth on Monday &Friday. She takes Metolazone PRN instead of twice a week.  LABS: 11/29/2017 Creatinine 1.21, BUN 23, Potassium 3.8, Sodium 135, EGFR 41-47 11/28/2017 Creatinine 1.09, BUN 19, Potassium 3.6, Sodium 137, EGFR 46-54 11/27/2017 Creatinine 1.20, BUN 20, Potassium 3.8, Sodium 132, EGFR 41-48 11/09/2017 Creatinine 1.88, BUN 34, Potassium 4.6, Sodium 134, EGFR 24-28  Recommendations:  Torsemide was increased at hospital discharge to twice a day but she only takes once a day because she is losing so much weight.  She will not take Metolazone because it causes weakness.  Dr Bronson Ing has referred her to HF clinic several times and she  has canceled the appointments that were made from the referrals.    Follow-up plan: ICM clinic phone appointment on 01/01/2018 to recheck fluid levels  Office appointment scheduled 01/26/2018 with Dr. Rayann Heman.   Copy of ICM check sent to Dr. Rayann Heman and Dr. Bronson Ing.   3 month ICM trend: 12/21/2017    1 Year ICM trend:       Rosalene Billings, RN 12/21/2017 3:31 PM

## 2018-01-01 ENCOUNTER — Ambulatory Visit (INDEPENDENT_AMBULATORY_CARE_PROVIDER_SITE_OTHER): Payer: Self-pay

## 2018-01-01 DIAGNOSIS — I5042 Chronic combined systolic (congestive) and diastolic (congestive) heart failure: Secondary | ICD-10-CM

## 2018-01-01 DIAGNOSIS — Z95810 Presence of automatic (implantable) cardiac defibrillator: Secondary | ICD-10-CM

## 2018-01-01 NOTE — Progress Notes (Signed)
EPIC Encounter for ICM Monitoring  Patient Name: [name] is a 84 y.o. female Date: 01/01/2018 Primary Care Physican: Loman Brooklyn, FNP Primary Cardiologist: Bronson Ing Electrophysiologist: Allred Dry Weight:150lbs  Bi-V Pacing:97.3%      Heart Failure questions reviewed, pt asymptomatic.  Patient says she keeps losing weight and unable to take diuretics as prescribed.   She has an appt to see the wound center but unsure if she has transportation.   Advised to discuss with Home health care about having a SW visit to discuss resources.  Hospitalization on 11/27/2017 resulted being diuresed during her stay.    Thoracic impedance abnormal suggesting fluid accumulation since 09/08/2017.  Prescribed dosage: Torsemide 20 mg2tablets (40 mg total)twice a day. Metolazone 2.5 mg one tab by mouth on Monday &Friday. Not taking Metolazone or Torsemide as prescribed  LABS: 11/29/2017 Creatinine 1.21, BUN 23, Potassium 3.8, Sodium 135, EGFR 41-47 11/28/2017 Creatinine 1.09, BUN 19, Potassium 3.6, Sodium 137, EGFR 46-54 11/27/2017 Creatinine 1.20, BUN 20, Potassium 3.8, Sodium 132, EGFR 41-48 11/09/2017 Creatinine 1.88, BUN 34, Potassium 4.6, Sodium 134, EGFR 24-28  Recommendations:  Patient declines to take Torsemide as prescribed, declined a referral to GI MD made by PCP, declined referral to HF clinic made by Dr Bronson Ing, declines to take Metolazone because it makes her weak.  There is a referral in place for home health care.  She does not want to go to an assisted living.     Follow-up plan: ICM clinic phone appointment on 01/11/2018 to recheck fluid levels.    Appointments: Office appointment scheduled 01/26/2018 with Dr. Rayann Heman.   Copy of ICM check sent to Dr. Rayann Heman and Dr Bronson Ing.   3 month ICM trend: 01/01/2018    1 Year ICM trend:       Rosalene Billings, RN 01/01/2018 11:43 AM

## 2018-01-11 ENCOUNTER — Ambulatory Visit (INDEPENDENT_AMBULATORY_CARE_PROVIDER_SITE_OTHER): Payer: Medicare Other | Admitting: *Deleted

## 2018-01-11 ENCOUNTER — Telehealth: Payer: Self-pay | Admitting: *Deleted

## 2018-01-11 ENCOUNTER — Ambulatory Visit (INDEPENDENT_AMBULATORY_CARE_PROVIDER_SITE_OTHER): Payer: Medicare Other

## 2018-01-11 DIAGNOSIS — Z95810 Presence of automatic (implantable) cardiac defibrillator: Secondary | ICD-10-CM | POA: Diagnosis not present

## 2018-01-11 DIAGNOSIS — I5042 Chronic combined systolic (congestive) and diastolic (congestive) heart failure: Secondary | ICD-10-CM | POA: Diagnosis not present

## 2018-01-11 DIAGNOSIS — I255 Ischemic cardiomyopathy: Secondary | ICD-10-CM

## 2018-01-11 NOTE — Progress Notes (Signed)
Remote ICD transmission.   

## 2018-01-11 NOTE — Progress Notes (Signed)
EPIC Encounter for ICM Monitoring  Patient Name: [name] is a 84 y.o. female Date: 01/11/2018 Primary Care Physican: Loman Brooklyn, FNP Primary Cardiologist: Bronson Ing Electrophysiologist: Allred Dry Weight: 151lbs  Bi-V Pacing:97.2% Battery Longevity: 7 months        Heart Failure questions reviewed, pt asymptomatic for fluid symptoms.  She said she has a home health nurse coming for wound care.  Advised to ask for SW visit to help identify resources for home care and transportation.  She said the nurse mentioned a transportation system but it would costs $17 a trip and she can't afford that.  She received a call from device clinic nurse but was not sure she understood the information about her device.  Advised to keep the appointment with Dr Rayann Heman in August so he can evaluate her device and make changes if needed.    Thoracic impedance continues to be abnormal suggesting fluid accumulation since 09/08/2017.  Prescribed dosage: Torsemide 20 mg2tablets (40 mg total)twice a day. Metolazone 2.5 mg one tab by mouth on Monday &Friday. Not taking Metolazone or Torsemide as prescribed  LABS: 11/29/2017 Creatinine 1.21, BUN 23, Potassium 3.8, Sodium 135, EGFR 41-47 11/28/2017 Creatinine 1.09, BUN 19, Potassium 3.6, Sodium 137, EGFR 46-54 11/27/2017 Creatinine 1.20, BUN 20, Potassium 3.8, Sodium 132, EGFR 41-48 11/09/2017 Creatinine 1.88, BUN 34, Potassium 4.6, Sodium 134, EGFR 24-28  Recommendations:  Patient does not take Metolazone as prescribed because it makes her too weak.  Encouraged to call for fluid symptoms.  Follow-up plan: ICM clinic phone appointment on 02/08/2018 since she has an office appointment scheduled 01/26/2018 with Dr.Allred.   Copy of ICM check sent to Dr. Rayann Heman and Dr Bronson Ing.   3 month ICM trend: 01/11/2018    1 Year ICM trend:       Rosalene Billings, RN 01/11/2018 11:48 AM

## 2018-01-11 NOTE — Telephone Encounter (Signed)
Reviewed trends with Dr.Klein, recommendation is for patient to follow up with Dr.Allred on 01/26/18 as scheduled.  Spoke to tech svcs about lead trends. Tech svcs recommends that patient come into the office for isometrics, serial impedances, and xray (last obtained on 6/3).  Informed patient that Dr.Klein recommended that she f/u with Dr.Allred as scheduled. Patient verbalized understanding.

## 2018-01-11 NOTE — Telephone Encounter (Signed)
-----   Message from Rosalene Billings, RN sent at 01/11/2018 12:21 PM EDT ----- Regarding: RV high Threshold on Carelink Report High RV Threshold is listed under observations on patient remote transmission.  Can you review for abnormalities?   Thanks!  [name]

## 2018-01-11 NOTE — Telephone Encounter (Signed)
Informed patient that remote transmission from today shows an elevated RV threshold and decrease in R wave amplitude since April 2019.  Patient denies any recent surgeries or medication changes.  Most recent cxr is from 11/27/17. Will have MD to review. Patient states that she is unable to go for a repeat cxr d/t lack of transportation. Spoke with Anderson Malta with [employer] tech svcs. Anderson Malta states that she will call me back once she's had a chance to review the information in detail.

## 2018-01-12 ENCOUNTER — Encounter: Payer: Self-pay | Admitting: Cardiology

## 2018-01-18 LAB — CUP PACEART REMOTE DEVICE CHECK
Battery Remaining Longevity: 8 mo
Battery Voltage: 2.87 V
Brady Statistic AP VP Percent: 4.9 %
Brady Statistic RA Percent Paced: 4.9 %
Brady Statistic RV Percent Paced: 97.19 %
HighPow Impedance: 45 Ohm
HighPow Impedance: 60 Ohm
Implantable Lead Location: 753858
Implantable Lead Location: 753859
Implantable Lead Model: 4194
Implantable Lead Model: 5076
Implantable Lead Model: 7120
Implantable Pulse Generator Implant Date: 20150402
Lead Channel Impedance Value: 266 Ohm
Lead Channel Impedance Value: 380 Ohm
Lead Channel Impedance Value: 4047 Ohm
Lead Channel Impedance Value: 4047 Ohm
Lead Channel Pacing Threshold Amplitude: 1 V
Lead Channel Pacing Threshold Amplitude: 2 V
Lead Channel Pacing Threshold Amplitude: 2.125 V
Lead Channel Sensing Intrinsic Amplitude: 1 mV
Lead Channel Sensing Intrinsic Amplitude: 1 mV
Lead Channel Sensing Intrinsic Amplitude: 1.75 mV
Lead Channel Setting Pacing Amplitude: 4 V
Lead Channel Setting Pacing Amplitude: 5 V
Lead Channel Setting Pacing Pulse Width: 0.4 ms
Lead Channel Setting Sensing Sensitivity: 0.3 mV
MDC IDC LEAD IMPLANT DT: 20060424
MDC IDC LEAD IMPLANT DT: 20060424
MDC IDC LEAD IMPLANT DT: 20101202
MDC IDC LEAD LOCATION: 753860
MDC IDC MSMT LEADCHNL LV IMPEDANCE VALUE: 627 Ohm
MDC IDC MSMT LEADCHNL LV PACING THRESHOLD PULSEWIDTH: 1 ms
MDC IDC MSMT LEADCHNL RA IMPEDANCE VALUE: 456 Ohm
MDC IDC MSMT LEADCHNL RA PACING THRESHOLD PULSEWIDTH: 0.4 ms
MDC IDC MSMT LEADCHNL RV PACING THRESHOLD PULSEWIDTH: 0.4 ms
MDC IDC MSMT LEADCHNL RV SENSING INTR AMPL: 1.75 mV
MDC IDC SESS DTM: 20190718052403
MDC IDC SET LEADCHNL LV PACING PULSEWIDTH: 1 ms
MDC IDC SET LEADCHNL RA PACING AMPLITUDE: 2 V
MDC IDC STAT BRADY AP VS PERCENT: 0.06 %
MDC IDC STAT BRADY AS VP PERCENT: 93.51 %
MDC IDC STAT BRADY AS VS PERCENT: 1.53 %

## 2018-01-26 ENCOUNTER — Ambulatory Visit: Payer: Medicare Other | Admitting: Internal Medicine

## 2018-01-26 ENCOUNTER — Encounter: Payer: Self-pay | Admitting: Internal Medicine

## 2018-01-26 VITALS — BP 92/55 | HR 63 | Ht 68.0 in | Wt 158.4 lb

## 2018-01-26 DIAGNOSIS — Z95810 Presence of automatic (implantable) cardiac defibrillator: Secondary | ICD-10-CM | POA: Diagnosis not present

## 2018-01-26 DIAGNOSIS — I255 Ischemic cardiomyopathy: Secondary | ICD-10-CM

## 2018-01-26 DIAGNOSIS — I5022 Chronic systolic (congestive) heart failure: Secondary | ICD-10-CM | POA: Diagnosis not present

## 2018-01-26 LAB — CUP PACEART INCLINIC DEVICE CHECK
Battery Voltage: 2.85 V
Brady Statistic RA Percent Paced: 2.31 %
Brady Statistic RV Percent Paced: 96.68 %
HighPow Impedance: 44 Ohm
HighPow Impedance: 58 Ohm
Implantable Lead Implant Date: 20101202
Implantable Lead Location: 753859
Implantable Lead Model: 4194
Implantable Lead Model: 5076
Implantable Lead Model: 7120
Implantable Pulse Generator Implant Date: 20150402
Lead Channel Impedance Value: 266 Ohm
Lead Channel Impedance Value: 399 Ohm
Lead Channel Impedance Value: 4047 Ohm
Lead Channel Impedance Value: 456 Ohm
Lead Channel Impedance Value: 646 Ohm
Lead Channel Pacing Threshold Amplitude: 1 V
Lead Channel Pacing Threshold Amplitude: 2.5 V
Lead Channel Pacing Threshold Pulse Width: 1 ms
Lead Channel Pacing Threshold Pulse Width: 1 ms
Lead Channel Sensing Intrinsic Amplitude: 1 mV
Lead Channel Sensing Intrinsic Amplitude: 3 mV
Lead Channel Setting Pacing Pulse Width: 1 ms
MDC IDC LEAD IMPLANT DT: 20060424
MDC IDC LEAD IMPLANT DT: 20060424
MDC IDC LEAD LOCATION: 753858
MDC IDC LEAD LOCATION: 753860
MDC IDC MSMT BATTERY REMAINING LONGEVITY: 8 mo
MDC IDC MSMT LEADCHNL LV IMPEDANCE VALUE: 4047 Ohm
MDC IDC MSMT LEADCHNL LV PACING THRESHOLD AMPLITUDE: 2.25 V
MDC IDC MSMT LEADCHNL RA PACING THRESHOLD PULSEWIDTH: 0.4 ms
MDC IDC MSMT LEADCHNL RV PACING THRESHOLD AMPLITUDE: 1.75 V
MDC IDC MSMT LEADCHNL RV PACING THRESHOLD PULSEWIDTH: 1 ms
MDC IDC SESS DTM: 20190802160906
MDC IDC SET LEADCHNL LV PACING AMPLITUDE: 4 V
MDC IDC SET LEADCHNL RA PACING AMPLITUDE: 2 V
MDC IDC SET LEADCHNL RV PACING AMPLITUDE: 0.5 V
MDC IDC SET LEADCHNL RV PACING PULSEWIDTH: 0.03 ms
MDC IDC SET LEADCHNL RV SENSING SENSITIVITY: 0.3 mV
MDC IDC STAT BRADY AP VP PERCENT: 2.29 %
MDC IDC STAT BRADY AP VS PERCENT: 0.04 %
MDC IDC STAT BRADY AS VP PERCENT: 96.13 %
MDC IDC STAT BRADY AS VS PERCENT: 1.54 %

## 2018-01-26 NOTE — Patient Instructions (Signed)
Medication Instructions:  Continue all current medications.  Labwork: none  Testing/Procedures: none  Follow-Up: 6 months   Any Other Special Instructions Will Be Listed Below (If Applicable). Remote monitoring is used to monitor your Pacemaker of ICD from home. This monitoring reduces the number of office visits required to check your device to one time per year. It allows [place] to keep an eye on the functioning of your device to ensure it is working properly. You are scheduled for a device check from home on 02/08/2018. You may send your transmission at any time that day. If you have a wireless device, the transmission will be sent automatically. After your physician reviews your transmission, you will receive a postcard with your next transmission date.  If you need a refill on your cardiac medications before your next appointment, please call your pharmacy.

## 2018-01-26 NOTE — Progress Notes (Signed)
PCP: Loman Brooklyn, FNP Primary Cardiologist: Dr Harl Bowie Primary EP: Dr Rayann Heman  [name] is a 84 y.o. female who presents today for routine electrophysiology followup.  Since last being seen in our clinic, the patient reports doing reasonably well.  She has chronic SOB.  Not very active.  Multiple non cardiac complaints today.  Today, she denies symptoms of palpitations, chest pain, lower extremity edema, dizziness, presyncope, syncope, or ICD shocks.  The patient is otherwise without complaint today.   Past Medical History:  Diagnosis Date  . Aneurysm ([place])    Apical; previously on Coumadin  . Anxiety disorder   . Cancer (HCC)    Skin  . Chronic renal insufficiency    Creatinine 1.17 January 2010  . Coronary artery disease    Cath 02/15/12 revealed RCA as culprit lesion w/ occlusion at site of prior stent, patent LAD stent, chronic LCx and Diagonal dz; Medical therapy   . Depression   . GERD (gastroesophageal reflux disease)   . Hyperlipidemia    H/o GI intolerance to Lipitor  . Impingement syndrome of left shoulder   . Ischemic cardiomyopathy    s/p Medtronic BiV ICD 2006  . Peripheral polyneuropathy    Refractory to several medications  . Systolic and diastolic CHF, chronic (HCC)    echo 03/2012 EF 62-37%, mod diastolic dysfunction, mod MR, mod LAE, mod-severe TR, PASP 67mmHg  . Type 2 diabetes mellitus ([place])    Past Surgical History:  Procedure Laterality Date  . ABDOMINAL HYSTERECTOMY    . CARDIAC DEFIBRILLATOR PLACEMENT  05/28/2009   Medtronic ICD placed by Dr. Caryl Comes secondary to ICM/CHF, [phone] Fidelis lead fracture with ICD storm 2010 requiring new RV lead placement  . CHOLECYSTECTOMY    . EYE SURGERY    . IMPLANTABLE CARDIOVERTER DEFIBRILLATOR GENERATOR CHANGE  09/26/13   BiV ICD generator change by Dr Rayann Heman - [facility] [place] XT CRT-D  . IMPLANTABLE CARDIOVERTER DEFIBRILLATOR GENERATOR CHANGE N/A 09/26/2013   Procedure: IMPLANTABLE CARDIOVERTER DEFIBRILLATOR  GENERATOR CHANGE;  Surgeon: Coralyn Mark, MD;  Location: [facility] CATH LAB;  Service: Cardiovascular;  Laterality: N/A;  . LEFT HEART CATHETERIZATION WITH CORONARY ANGIOGRAM N/A 02/15/2012   Procedure: LEFT HEART CATHETERIZATION WITH CORONARY ANGIOGRAM;  Surgeon: Peter M Martinique, MD;  Location: [facility] CATH LAB;  Service: Cardiovascular;  Laterality: N/A;  . RIGHT HEART CATHETERIZATION N/A 04/27/2012   Procedure: RIGHT HEART CATH;  Surgeon: Larey Dresser, MD;  Location: [facility] CATH LAB;  Service: Cardiovascular;  Laterality: N/A;    ROS- all systems are reviewed and negative except as per HPI above  Current Outpatient Medications  Medication Sig Dispense Refill  . albuterol (ACCUNEB) 0.63 MG/3ML nebulizer solution Take 1 ampule by nebulization every 6 (six) hours as needed for wheezing.    Marland Kitchen allopurinol (ZYLOPRIM) 100 MG tablet Take 200 mg by mouth daily.     Marland Kitchen ALPRAZolam (XANAX) 0.25 MG tablet Take 0.25 mg by mouth at bedtime as needed for sleep.    . Ascorbic Acid (VITAMIN C) 500 MG CAPS Take 100 mg by mouth daily.     Marland Kitchen aspirin 81 MG chewable tablet Chew 1 tablet (81 mg total) by mouth daily.    . carvedilol (COREG) 3.125 MG tablet Take 1 tablet (3.125 mg total) by mouth 2 (two) times daily with a meal. 180 tablet 3  . cephALEXin (KEFLEX) 500 MG capsule Take 500 mg by mouth 2 (two) times daily.    . citalopram (CELEXA) 20 MG tablet Take  20 mg by mouth daily.     . cyanocobalamin 2000 MCG tablet Take 2,000 mcg by mouth daily.    . ergocalciferol (VITAMIN D2) 50000 units capsule Take 50,000 Units by mouth 2 (two) times a week.    Marland Kitchen HYDROmorphone (DILAUDID) 4 MG tablet Take 4 mg by mouth 2 (two) times daily as needed for severe pain.    Marland Kitchen insulin glargine (LANTUS) 100 unit/mL SOPN Inject 50 Units into the skin 2 (two) times daily. 50 units in the morning and 50 units in the evening    . lactulose (CHRONULAC) 10 GM/15ML solution Take by mouth. 15 mls daily    . metolazone (ZAROXOLYN) 2.5 MG tablet TAKE 1  TABLET 2 TIMES A WEEK ON MONDAYS AND FRIDAYS. (Patient taking differently: TAKE 1 TABLET AS NEEDED FOR FLUID RETENTION) 8 tablet 1  . nitroGLYCERIN (NITROSTAT) 0.4 MG SL tablet Place 1 tablet (0.4 mg total) under the tongue every 5 (five) minutes as needed for chest pain. 25 tablet 3  . ondansetron (ZOFRAN) 4 MG tablet Take 4 mg by mouth every 8 (eight) hours as needed for nausea or vomiting.    . OXYGEN Inhale 2 L into the lungs continuous.    . ranitidine (ZANTAC) 150 MG tablet Take 150 mg by mouth daily. At 6am    . torsemide (DEMADEX) 20 MG tablet Take 2 tablets (40 mg total) by mouth 2 (two) times daily. 120 tablet 6   No current facility-administered medications for this visit.     Physical Exam: Vitals:   01/26/18 1133  BP: (!) 92/55  Pulse: 63  SpO2: 98%  Weight: 158 lb 6.4 oz (71.8 kg)  Height: 5\' 8"  (1.727 m)    GEN- The patient is ill appearing,wearing O2, alert and oriented x 3 today.   Head- normocephalic, atraumatic Eyes-  Sclera clear, conjunctiva pink Ears- hearing intact Oropharynx- clear Lungs- Clear to ausculation bilaterally, normal work of breathing Chest- ICD pocket is well healed Heart- Regular rate and rhythm, no murmurs, rubs or gallops, PMI not laterally displaced GI- soft, NT, ND, + BS Extremities- no clubbing, cyanosis, or edema  ICD interrogation- reviewed in detail today,  See PACEART report    Wt Readings from Last 3 Encounters:  01/26/18 158 lb 6.4 oz (71.8 kg)  11/29/17 159 lb 6.3 oz (72.3 kg)  11/21/17 164 lb 3.2 oz (74.5 kg)    Assessment and Plan:  1.  Chronic systolic dysfunction/ CAD/ ischemic CM euvolemic today Stable on an appropriate medical regimen Her RV lead appears to have failed since her last visit.  R waves are now 1.8 mV bipolar and 3 mV integrated bipolar (previously 12).  Her RV lead threshold is now 2.5V @ 1 msec bipolar and 1.75 V @ 1 msec tip to coil.  We had a long discussions today about options of lead revision or  programming ICD therapies off and programming to LV pace only.  She is very clear that she is not interested in any EP procedures at this time.  She favors a conservative approach and would like to turn ICD therapies off. I have therefore programmed ICD therapies off.  I have turned RV lead pacing output to 0.5V @ 0.03 msec.  RV sense polarity was changed to integrated bipolar.  Adaptive CRT is turned off and delays are fixed at 150/110 msec.  RV lead patient alerts are also turned off today.   2. Code status I had a long discussion with her today.  She is not doing well overall and requires supplementary O2.  She is very clear that her wishes are DNI/DNR at this time.  I think that this is very appropriate.   Carelink Return in 6 months  Thompson Grayer MD, [facility] 01/26/2018 12:15 PM

## 2018-02-08 ENCOUNTER — Ambulatory Visit (INDEPENDENT_AMBULATORY_CARE_PROVIDER_SITE_OTHER): Payer: Medicare Other

## 2018-02-08 DIAGNOSIS — I5022 Chronic systolic (congestive) heart failure: Secondary | ICD-10-CM

## 2018-02-08 DIAGNOSIS — Z95810 Presence of automatic (implantable) cardiac defibrillator: Secondary | ICD-10-CM

## 2018-02-09 ENCOUNTER — Other Ambulatory Visit: Payer: Self-pay | Admitting: Cardiovascular Disease

## 2018-02-09 NOTE — Progress Notes (Signed)
EPIC Encounter for ICM Monitoring  Patient Name: [name] is a 84 y.o. female Date: 02/09/2018 Primary Care Physican: Loman Brooklyn, FNP Primary Cardiologist: Bronson Ing Electrophysiologist: Allred Dry Weight: 151lbs  Bi-V Pacing:96.5% Estimated Battery Longevity: 7 months      Heart Failure questions reviewed, pt reported she never feels well but no problems with fluid symptoms.  She is having problems with her vision and has seen the eye doctor.    Thoracic impedance abnormal suggesting fluid accumulation.  Prescribed dosage: Torsemide 20 mg2tablets (40 mg total)twice a day. Metolazone 2.5 mg one tab by mouth on Monday &Friday. Not taking Metolazone or Torsemide as prescribed  LABS: 11/29/2017 Creatinine 1.21, BUN 23, Potassium 3.8, Sodium 135, EGFR 41-47 11/28/2017 Creatinine 1.09, BUN 19, Potassium 3.6, Sodium 137, EGFR 46-54 11/27/2017 Creatinine 1.20, BUN 20, Potassium 3.8, Sodium 132, EGFR 41-48 11/09/2017 Creatinine 1.88, BUN 34, Potassium 4.6, Sodium 134, EGFR 24-28  Recommendations: No changes.   Encouraged to call for fluid symptoms.  Follow-up plan: ICM clinic phone appointment on 03/12/2018.   Office appointment scheduled 03/14/2018 with Dr. Bronson Ing.    Copy of ICM check sent to Dr. Rayann Heman and Dr Bronson Ing.   3 month ICM trend: 02/08/2018    1 Year ICM trend:       Rosalene Billings, RN 02/09/2018 10:18 AM

## 2018-02-26 DIAGNOSIS — J449 Chronic obstructive pulmonary disease, unspecified: Secondary | ICD-10-CM | POA: Diagnosis not present

## 2018-02-26 DIAGNOSIS — I288 Other diseases of pulmonary vessels: Secondary | ICD-10-CM | POA: Diagnosis not present

## 2018-03-01 DIAGNOSIS — C030 Malignant neoplasm of upper gum: Secondary | ICD-10-CM | POA: Diagnosis not present

## 2018-03-05 DIAGNOSIS — C030 Malignant neoplasm of upper gum: Secondary | ICD-10-CM | POA: Diagnosis not present

## 2018-03-12 ENCOUNTER — Ambulatory Visit (INDEPENDENT_AMBULATORY_CARE_PROVIDER_SITE_OTHER): Payer: Medicare Other

## 2018-03-12 DIAGNOSIS — I5022 Chronic systolic (congestive) heart failure: Secondary | ICD-10-CM

## 2018-03-12 DIAGNOSIS — Z95810 Presence of automatic (implantable) cardiac defibrillator: Secondary | ICD-10-CM

## 2018-03-13 DIAGNOSIS — I288 Other diseases of pulmonary vessels: Secondary | ICD-10-CM | POA: Diagnosis not present

## 2018-03-13 DIAGNOSIS — J449 Chronic obstructive pulmonary disease, unspecified: Secondary | ICD-10-CM | POA: Diagnosis not present

## 2018-03-13 DIAGNOSIS — R0902 Hypoxemia: Secondary | ICD-10-CM | POA: Diagnosis not present

## 2018-03-13 NOTE — Progress Notes (Signed)
EPIC Encounter for ICM Monitoring  Patient Name: [name] is a 84 y.o. female Date: 03/13/2018 Primary Care Physican: Loman Brooklyn, FNP Primary Cardiologist: Bronson Ing Electrophysiologist: Allred Dry Weight: unknownlbs  Estimated Battery Longevity: 9 months          Per message from Rochelle, device RN, reported RV Threshold and sensing are unstable and Medtronic tech services recommended not to trust OptiVol/thoracic impedance.  Advised to unenroll from [facility] clinic since cannot use Optivol at this time.  ICD therapies have been turned off at visit with Dr Rayann Heman 01/26/2018.   Thoracic impedance continues to be abnormal.  Prescribed:Torsemide 20 mg2tablets (40 mg total)twice a day. Metolazone 2.5 mg one tab by mouth on Monday &Friday. Not taking Metolazone or Torsemide as prescribed  LABS: 11/29/2017 Creatinine 1.21, BUN 23, Potassium 3.8, Sodium 135, EGFR 41-47 11/28/2017 Creatinine 1.09, BUN 19, Potassium 3.6, Sodium 137, EGFR 46-54 11/27/2017 Creatinine 1.20, BUN 20, Potassium 3.8, Sodium 132, EGFR 41-48 11/09/2017 Creatinine 1.88, BUN 34, Potassium 4.6, Sodium 134, EGFR 24-28  Recommendations:  No further ICM follow due to Optivol is inaccurate for fluid readings since RN threshold and sensing are unstable.  Patient has declined to undergo any EP procedures.  Follow-up plan: None    3 month ICM trend: 03/12/2018    1 Year ICM trend:       Rosalene Billings, RN 03/13/2018 11:26 AM

## 2018-03-14 ENCOUNTER — Encounter: Payer: Self-pay | Admitting: Cardiovascular Disease

## 2018-03-14 ENCOUNTER — Ambulatory Visit: Payer: Medicare Other | Admitting: Cardiovascular Disease

## 2018-03-14 VITALS — BP 122/58 | HR 65 | Ht 68.0 in | Wt 166.0 lb

## 2018-03-14 DIAGNOSIS — I5042 Chronic combined systolic (congestive) and diastolic (congestive) heart failure: Secondary | ICD-10-CM | POA: Diagnosis not present

## 2018-03-14 DIAGNOSIS — I25118 Atherosclerotic heart disease of native coronary artery with other forms of angina pectoris: Secondary | ICD-10-CM

## 2018-03-14 DIAGNOSIS — E785 Hyperlipidemia, unspecified: Secondary | ICD-10-CM

## 2018-03-14 DIAGNOSIS — I5022 Chronic systolic (congestive) heart failure: Secondary | ICD-10-CM

## 2018-03-14 DIAGNOSIS — Z95810 Presence of automatic (implantable) cardiac defibrillator: Secondary | ICD-10-CM

## 2018-03-14 MED ORDER — POTASSIUM CHLORIDE CRYS ER 20 MEQ PO TBCR: 20.0000 meq | EXTENDED_RELEASE_TABLET | Freq: Every day | ORAL | 6 refills | Status: DC

## 2018-03-14 MED ORDER — METOLAZONE 2.5 MG PO TABS: 2.5000 mg | ORAL_TABLET | ORAL | 6 refills | Status: DC | PRN

## 2018-03-14 NOTE — Patient Instructions (Signed)
Medication Instructions:   Begin Potassium 73meq daily.  Increase Metolazone to 5 x per week.  Continue all other medications.    Labwork:  BMET - order given today.  Do this Friday.  Office will contact with results via phone or letter.    Testing/Procedures: none  Follow-Up: 3 months   Any Other Special Instructions Will Be Listed Below (If Applicable).  If you need a refill on your cardiac medications before your next appointment, please call your pharmacy.

## 2018-03-14 NOTE — Progress Notes (Signed)
SUBJECTIVE: The patient presents for routine follow-up.  She has chronic exertional dyspnea which is stable.  She is followed by Dr. Rayann Heman.  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  She made it very clear to him that she was not interested in any further EP procedures and thus ICD therapies were turned off.  She was hospitalized in June for acute on chronic systolic heart failure.  At that time torsemide was increased to 40 mg twice daily.  Thoracic impedance when checked on 03/12/2018 continued to show abnormalities.  I have made numerous attempts to get her evaluated at the advanced heart failure clinic in [place] but she has consistently refused in spite of being offered transportation by her great niece and other relatives.  Echocardiogram 03/01/17 showed mildly reduced left ventricular systolic function, LVEF 02%, mild LVH, severe hypokinesis of the inferior and inferoseptal myocardium, grade 1 diastolic dysfunction, and mild mitral and tricuspid regurgitation.  She is here with her friend, Peter Congo, who used to work for Dr. Scotty Court for 32 years.  The patient has multiple somatic complaints including diffuse neuropathic and arthritic pain.  She was apparently diagnosed with oral cancer and sees a physician in [place].  She had some chest pain a few weeks ago after getting upset over a phone conversation with her brother, alleviated with 2 nitroglycerin.  She is now taking metolazone every Monday, Wednesday and Friday.    Review of Systems: As per "subjective", otherwise negative.  Allergies  Allergen Reactions  . Augmentin [Amoxicillin-Pot Clavulanate]     Abdominal pain  . Metformin     Upset stomach  . Metolazone     "made everything hurt" 07/03/15  . Sulfonamide Derivatives     swelling    Current Outpatient Medications  Medication Sig Dispense Refill  . albuterol (ACCUNEB) 0.63 MG/3ML nebulizer  solution Take 1 ampule by nebulization every 6 (six) hours as needed for wheezing.    Marland Kitchen allopurinol (ZYLOPRIM) 100 MG tablet Take 200 mg by mouth daily.     Marland Kitchen ALPRAZolam (XANAX) 0.25 MG tablet Take 0.25 mg by mouth at bedtime as needed for sleep.    . Ascorbic Acid (VITAMIN C) 500 MG CAPS Take 100 mg by mouth daily.     Marland Kitchen aspirin 81 MG chewable tablet Chew 1 tablet (81 mg total) by mouth daily.    . carvedilol (COREG) 3.125 MG tablet Take 1 tablet (3.125 mg total) by mouth 2 (two) times daily with a meal. 180 tablet 3  . citalopram (CELEXA) 20 MG tablet Take 20 mg by mouth daily.     . cyanocobalamin 2000 MCG tablet Take 2,000 mcg by mouth daily.    . ergocalciferol (VITAMIN D2) 50000 units capsule Take 50,000 Units by mouth 2 (two) times a week.    Marland Kitchen HYDROmorphone (DILAUDID) 4 MG tablet Take 4 mg by mouth 2 (two) times daily as needed for severe pain.    Marland Kitchen insulin glargine (LANTUS) 100 unit/mL SOPN Inject 50 Units into the skin 2 (two) times daily. 50 units in the morning and 50 units in the evening    . lactulose (CHRONULAC) 10 GM/15ML solution Take by mouth. 15 mls daily    . metolazone (ZAROXOLYN) 2.5 MG tablet TAKE 1 TABLET TWICE A WEEK ON MONDAYS AND FRIDAYS. 8 tablet 1  . nitroGLYCERIN (NITROSTAT) 0.4 MG SL tablet Place 1 tablet (0.4 mg total) under the tongue every 5 (five)  minutes as needed for chest pain. 25 tablet 3  . ondansetron (ZOFRAN) 4 MG tablet Take 4 mg by mouth every 8 (eight) hours as needed for nausea or vomiting.    . OXYGEN Inhale 2 L into the lungs continuous.    . ranitidine (ZANTAC) 150 MG tablet Take 150 mg by mouth daily. At 6am    . torsemide (DEMADEX) 20 MG tablet Take 2 tablets (40 mg total) by mouth 2 (two) times daily. 120 tablet 6   No current facility-administered medications for this visit.     Past Medical History:  Diagnosis Date  . Aneurysm ([place])    Apical; previously on Coumadin  . Anxiety disorder   . Cancer (HCC)    Skin  . Chronic renal  insufficiency    Creatinine 1.17 January 2010  . Coronary artery disease    Cath 02/15/12 revealed RCA as culprit lesion w/ occlusion at site of prior stent, patent LAD stent, chronic LCx and Diagonal dz; Medical therapy   . Depression   . GERD (gastroesophageal reflux disease)   . Hyperlipidemia    H/o GI intolerance to Lipitor  . Impingement syndrome of left shoulder   . Ischemic cardiomyopathy    s/p Medtronic BiV ICD 2006  . Peripheral polyneuropathy    Refractory to several medications  . Systolic and diastolic CHF, chronic (HCC)    echo 03/2012 EF 36-64%, mod diastolic dysfunction, mod MR, mod LAE, mod-severe TR, PASP 51mmHg  . Type 2 diabetes mellitus ([place])     Past Surgical History:  Procedure Laterality Date  . ABDOMINAL HYSTERECTOMY    . CARDIAC DEFIBRILLATOR PLACEMENT  05/28/2009   Medtronic ICD placed by Dr. Caryl Comes secondary to ICM/CHF, [phone] [place] lead fracture with ICD storm 2010 requiring new RV lead placement  . CHOLECYSTECTOMY    . EYE SURGERY    . IMPLANTABLE CARDIOVERTER DEFIBRILLATOR GENERATOR CHANGE  09/26/13   BiV ICD generator change by Dr Rayann Heman - MDT [place] Proud XT CRT-D  . IMPLANTABLE CARDIOVERTER DEFIBRILLATOR GENERATOR CHANGE N/A 09/26/2013   Procedure: IMPLANTABLE CARDIOVERTER DEFIBRILLATOR GENERATOR CHANGE;  Surgeon: Coralyn Mark, MD;  Location: [facility] CATH LAB;  Service: Cardiovascular;  Laterality: N/A;  . LEFT HEART CATHETERIZATION WITH CORONARY ANGIOGRAM N/A 02/15/2012   Procedure: LEFT HEART CATHETERIZATION WITH CORONARY ANGIOGRAM;  Surgeon: Peter M Martinique, MD;  Location: [facility] CATH LAB;  Service: Cardiovascular;  Laterality: N/A;  . RIGHT HEART CATHETERIZATION N/A 04/27/2012   Procedure: RIGHT HEART CATH;  Surgeon: Larey Dresser, MD;  Location: [facility] CATH LAB;  Service: Cardiovascular;  Laterality: N/A;    Social History   Socioeconomic History  . Marital status: Widowed    Spouse name: Not on file  . Number of children: Not on file  . Years of education: Not on  file  . Highest education level: Not on file  Occupational History  . Occupation: Retired    Fish farm manager: RETIRED  Social Needs  . Financial resource strain: Not on file  . Food insecurity:    Worry: Not on file    Inability: Not on file  . Transportation needs:    Medical: Not on file    Non-medical: Not on file  Tobacco Use  . Smoking status: Former Smoker    Packs/day: 0.80    Years: 30.00    Pack years: 24.00    Types: Cigarettes    Start date: 04/05/1957    Last attempt to quit: 06/27/2002    Years since quitting: 15.7  .  Smokeless tobacco: Never Used  Substance and Sexual Activity  . Alcohol use: No    Alcohol/week: 0.0 standard drinks  . Drug use: No  . Sexual activity: Not Currently  Lifestyle  . Physical activity:    Days per week: Not on file    Minutes per session: Not on file  . Stress: Not on file  Relationships  . Social connections:    Talks on phone: Not on file    Gets together: Not on file    Attends religious service: Not on file    Active member of club or organization: Not on file    Attends meetings of clubs or organizations: Not on file    Relationship status: Not on file  . Intimate partner violence:    Fear of current or ex partner: Not on file    Emotionally abused: Not on file    Physically abused: Not on file    Forced sexual activity: Not on file  Other Topics Concern  . Not on file  Social History Narrative  . Not on file     Vitals:   03/14/18 1256  BP: (!) 122/58  Pulse: 65  SpO2: 97%  Weight: 166 lb (75.3 kg)  Height: 5\' 8"  (1.727 m)    Wt Readings from Last 3 Encounters:  03/14/18 166 lb (75.3 kg)  01/26/18 158 lb 6.4 oz (71.8 kg)  11/29/17 159 lb 6.3 oz (72.3 kg)     PHYSICAL EXAM General: NAD, elderly, chronically ill-appearing HEENT: Normal. Neck: No JVD, no thyromegaly. Lungs: Clear to auscultation bilaterally with normal respiratory effort. CV: Regular rate and rhythm, normal S1/S2, no S3/S4, no murmur. No  pretibial or periankle edema.  No carotid bruit.   Abdomen: Soft, nontender, no distention.  Neurologic: Alert and oriented.  Psych: Normal affect. Skin: Normal. Musculoskeletal: No gross deformities.    ECG: Reviewed above under Subjective   Labs:    Lipids: Lab Results  Component Value Date/Time   LDLCALC 68 06/06/2015 05:03 AM   CHOL 146 06/06/2015 05:03 AM   TRIG 289 (H) 06/06/2015 05:03 AM   HDL 20 (L) 06/06/2015 05:03 AM       ASSESSMENT AND PLAN:  1. CAD with ischemic cardiomyopathy: Stable ischemic heart disease. Continue ASA and Coreg. She has undergone prior stenting with her most recent coronary angiogram performed in August 2013 which showed left main 40%, LAD 50% ostial with patent stent in prox LAD, D2 90%, LCX occluded in mid vessel with left to left collaterals, RCA occluded proximally at prior stent site.  2. Chronic combined systolic and diastolic heart failure:  Weight is up 8 pounds since 01/26/2018.    She takes torsemide 40 mg twice daily and metolazone 2.5 mg 3 days/week.  I will increase this to 5 days/week, Monday through Friday.  I will check a basic metabolic panel in 2 days.  I will start potassium chloride 20 mEq daily.  3. Hyperlipidemia: Has not wanted to try alternative statin therapy.  4. CRT-D: Due to RV lead failure and the patient's preference for no further EP procedures, ICD therapies have been turned off by Dr. Rayann Heman.   Disposition: Follow up 3 months with APP   Kate Sable, M.D., F.A.C.C.

## 2018-03-15 DIAGNOSIS — I5022 Chronic systolic (congestive) heart failure: Secondary | ICD-10-CM | POA: Diagnosis not present

## 2018-03-15 DIAGNOSIS — H04123 Dry eye syndrome of bilateral lacrimal glands: Secondary | ICD-10-CM | POA: Diagnosis not present

## 2018-03-16 ENCOUNTER — Telehealth: Payer: Self-pay | Admitting: Cardiovascular Disease

## 2018-03-16 ENCOUNTER — Telehealth: Payer: Self-pay | Admitting: *Deleted

## 2018-03-16 NOTE — Telephone Encounter (Signed)
In the last 2 days patient has gained 3 lbs w/ increased SOB and swelling.  Started Metolazone today and Torsemide taken today. Faxing over weights. Please call with instructions. / tg

## 2018-03-16 NOTE — Telephone Encounter (Signed)
I would see how she responds to being on the corrected diuretic dosing over the next few days before decidign on restricting her fluid intake.   Zandra Abts MD

## 2018-03-16 NOTE — Telephone Encounter (Signed)
Spoke with patient and she c/o increased sob, swelling and a 3 lb weight gain in the 2 days. No c/o dizziness or chest pain. No added sodium to diet. Medications reconciled and patient confirmed that in the past when she took metolazone 3 days per week, she did not take her torsemide. Patient also confirmed that she is has been taking torsemide 20 mg by mouth twice daily on the days she does not take metolazone. Patient advised that her dosage for torsemide is 40 mg by mouth twice daily. Patient said she was unaware that she was supposed to be on this dosage. Patient is aware of her recent increase dose of metolazone to 5 days per week. Patient advised to continue daily weights and contact our office next week with an update on her symptoms. Patient advised that if he symptoms got worse, that she needed to go to the ED for an evaluation. Verbalized understanding of plan.

## 2018-03-16 NOTE — Telephone Encounter (Signed)
Patient informed and verbalized understanding of plan. 

## 2018-03-19 DIAGNOSIS — E1140 Type 2 diabetes mellitus with diabetic neuropathy, unspecified: Secondary | ICD-10-CM | POA: Diagnosis not present

## 2018-03-19 DIAGNOSIS — E1151 Type 2 diabetes mellitus with diabetic peripheral angiopathy without gangrene: Secondary | ICD-10-CM | POA: Diagnosis not present

## 2018-03-20 ENCOUNTER — Telehealth: Payer: Self-pay | Admitting: *Deleted

## 2018-03-20 DIAGNOSIS — N289 Disorder of kidney and ureter, unspecified: Secondary | ICD-10-CM

## 2018-03-20 DIAGNOSIS — N183 Chronic kidney disease, stage 3 unspecified: Secondary | ICD-10-CM

## 2018-03-20 DIAGNOSIS — I5023 Acute on chronic systolic (congestive) heart failure: Secondary | ICD-10-CM

## 2018-03-20 MED ORDER — TORSEMIDE 20 MG PO TABS: 40.0000 mg | ORAL_TABLET | ORAL | 6 refills | Status: DC

## 2018-03-20 NOTE — Telephone Encounter (Signed)
-----   Message from Herminio Commons, MD sent at 03/20/2018  9:44 AM EDT ----- Regarding: RE: does she need medication adjustment with worsening kidney function? referral placed for nephrology Unfortunately, she really does need a higher dose of diuretic but has cardiorenal syndrome.  She can reduce the evening dose of torsemide to 20 mg and repeat a basic metabolic panel in 1 week.  However, I am concerned that she will have recurrence of fluid accumulation.  ----- Message ----- From: Merlene Laughter, RN Sent: 03/20/2018   9:34 AM EDT To: Herminio Commons, MD Subject: does she need medication adjustment with wor#

## 2018-03-20 NOTE — Telephone Encounter (Signed)
Patient informed and verbalized understanding of plan. 

## 2018-03-20 NOTE — Telephone Encounter (Signed)
-----   Message from Herminio Commons, MD sent at 03/19/2018  1:39 PM EDT ----- The higher dose of diuretic has made her renal function significantly worse.  I would recommend she also follow with nephrology with regards to this.

## 2018-03-20 NOTE — Telephone Encounter (Signed)
Patient informed. Nephrology referral placed.

## 2018-03-20 NOTE — Telephone Encounter (Signed)
Patient contacted office to report that her feet are numb. Patient advised to contact her PCP about this problem.Verbalized understanding of plan.

## 2018-03-21 ENCOUNTER — Telehealth: Payer: Self-pay | Admitting: *Deleted

## 2018-03-21 NOTE — Telephone Encounter (Signed)
Error

## 2018-03-30 ENCOUNTER — Telehealth: Payer: Self-pay | Admitting: *Deleted

## 2018-03-30 NOTE — Telephone Encounter (Signed)
error 

## 2018-04-12 ENCOUNTER — Ambulatory Visit (INDEPENDENT_AMBULATORY_CARE_PROVIDER_SITE_OTHER): Payer: Medicare Other | Admitting: *Deleted

## 2018-04-12 DIAGNOSIS — I255 Ischemic cardiomyopathy: Secondary | ICD-10-CM

## 2018-04-12 DIAGNOSIS — I5032 Chronic diastolic (congestive) heart failure: Secondary | ICD-10-CM

## 2018-04-12 NOTE — Progress Notes (Signed)
Remote ICD transmission.   

## 2018-04-13 ENCOUNTER — Encounter: Payer: Self-pay | Admitting: *Deleted

## 2018-04-13 ENCOUNTER — Telehealth: Payer: Self-pay | Admitting: Cardiovascular Disease

## 2018-04-13 NOTE — Telephone Encounter (Signed)
Pt says she was at [facility] ED this past week and had labs done at that time - will request - pt phone disconnected and busy signal when tried to called back

## 2018-04-13 NOTE — Telephone Encounter (Signed)
Asking if we got lab work Dr wanted  She said she got letter saying she needed to go see a kidney doctor but doesn't understand why

## 2018-04-16 ENCOUNTER — Other Ambulatory Visit: Payer: Self-pay | Admitting: *Deleted

## 2018-04-16 DIAGNOSIS — I5023 Acute on chronic systolic (congestive) heart failure: Secondary | ICD-10-CM

## 2018-04-16 DIAGNOSIS — N183 Chronic kidney disease, stage 3 unspecified: Secondary | ICD-10-CM

## 2018-04-20 ENCOUNTER — Telehealth: Payer: Self-pay | Admitting: *Deleted

## 2018-04-20 NOTE — Telephone Encounter (Signed)
Notes recorded by Laurine Blazer, LPN on 46/19/0122 at 4:05 PM EDT Patient notified. Copy to pmd & nephrology. Follow up scheduled for 05/25/2018 with Kerin Ransom, [place] in [place] office. Is scheduled to see Dr. Lowanda Foster on 05/07/2018. ------  Notes recorded by Herminio Commons, MD on 04/16/2018 at 4:35 PM EDT The following abnormalities are noted:  Renal function is slightly worse when compared to most recent results. Increasing the diuretic effects kidney function but she requires it. This is indicative of cardiorenal syndrome. All other values are normal, stable or within acceptable limits. Medication changes / Follow up labs / Other changes or recommendations:  She should follow-up with nephrology. Kate Sable, MD 04/16/2018 4:34 PM

## 2018-05-08 ENCOUNTER — Other Ambulatory Visit (HOSPITAL_COMMUNITY): Payer: Self-pay | Admitting: Oncology

## 2018-05-08 DIAGNOSIS — C050 Malignant neoplasm of hard palate: Secondary | ICD-10-CM

## 2018-05-08 DIAGNOSIS — C069 Malignant neoplasm of mouth, unspecified: Secondary | ICD-10-CM

## 2018-05-17 ENCOUNTER — Encounter (HOSPITAL_COMMUNITY)
Admission: RE | Admit: 2018-05-17 | Discharge: 2018-05-17 | Disposition: A | Discharge status: Home / Self Care | Payer: Medicare Other | Source: Ambulatory Visit | Attending: Oncology | Admitting: Oncology

## 2018-05-17 DIAGNOSIS — C050 Malignant neoplasm of hard palate: Secondary | ICD-10-CM | POA: Diagnosis not present

## 2018-05-17 DIAGNOSIS — C069 Malignant neoplasm of mouth, unspecified: Secondary | ICD-10-CM | POA: Diagnosis present

## 2018-05-17 LAB — GLUCOSE, CAPILLARY: GLUCOSE-CAPILLARY: 156 mg/dL — AB (ref 70–99)

## 2018-05-17 MED ORDER — FLUDEOXYGLUCOSE F - 18 (FDG) INJECTION
8.6000 | Freq: Once | INTRAVENOUS | Status: AC
  Administered 2018-05-17: 8.6 via INTRAVENOUS

## 2018-05-18 ENCOUNTER — Telehealth: Payer: Self-pay | Admitting: Cardiovascular Disease

## 2018-05-18 NOTE — Telephone Encounter (Signed)
Pt c/o of Chest Pain: 1. Are you having CP right now?  Yes  2. Are you experiencing any other symptoms (ex. SOB, nausea, vomiting, sweating)? Shortness of breath is worse than the CP  3. How long have you been experiencing CP?   2 days  4. Is your CP continuous or coming and going? Both  5. Have you taken Nitroglycerin? No   [name] is calling in reference to patient.

## 2018-05-18 NOTE — Telephone Encounter (Signed)
Per  [place], home health nurse, Patient c/o SOB x's 2 days that is worse today, Chest pain rated 7/10 x's 2 days. O2 sat 99% on 2L O2 via [place] & BP 120/72 & HR 88. Weight 159 lbs today and baseline is 160's lbs. No swelling noted in extremities Patient is taking a nitroglycerin now. Advised home health nurse that patient needed to go to the ED for an evaluation. Verbalized understanding of plan.

## 2018-05-19 ENCOUNTER — Inpatient Hospital Stay (HOSPITAL_COMMUNITY)
Admission: AD | Admit: 2018-05-19 | Discharge: 2018-05-22 | DRG: 280 | Disposition: A | Discharge status: Home Health | Payer: Medicare Other | Source: Other Acute Inpatient Hospital | Attending: Internal Medicine | Admitting: Internal Medicine

## 2018-05-19 ENCOUNTER — Observation Stay (HOSPITAL_COMMUNITY): Payer: Medicare Other

## 2018-05-19 ENCOUNTER — Other Ambulatory Visit (HOSPITAL_COMMUNITY): Payer: Medicare Other

## 2018-05-19 ENCOUNTER — Observation Stay (HOSPITAL_BASED_OUTPATIENT_CLINIC_OR_DEPARTMENT_OTHER): Payer: Medicare Other

## 2018-05-19 DIAGNOSIS — E1122 Type 2 diabetes mellitus with diabetic chronic kidney disease: Secondary | ICD-10-CM | POA: Diagnosis not present

## 2018-05-19 DIAGNOSIS — I5043 Acute on chronic combined systolic (congestive) and diastolic (congestive) heart failure: Secondary | ICD-10-CM | POA: Diagnosis present

## 2018-05-19 DIAGNOSIS — Z87891 Personal history of nicotine dependence: Secondary | ICD-10-CM

## 2018-05-19 DIAGNOSIS — F419 Anxiety disorder, unspecified: Secondary | ICD-10-CM | POA: Diagnosis present

## 2018-05-19 DIAGNOSIS — I2510 Atherosclerotic heart disease of native coronary artery without angina pectoris: Secondary | ICD-10-CM | POA: Diagnosis not present

## 2018-05-19 DIAGNOSIS — K7460 Unspecified cirrhosis of liver: Secondary | ICD-10-CM | POA: Diagnosis not present

## 2018-05-19 DIAGNOSIS — C050 Malignant neoplasm of hard palate: Secondary | ICD-10-CM | POA: Diagnosis present

## 2018-05-19 DIAGNOSIS — Z794 Long term (current) use of insulin: Secondary | ICD-10-CM

## 2018-05-19 DIAGNOSIS — Z79899 Other long term (current) drug therapy: Secondary | ICD-10-CM

## 2018-05-19 DIAGNOSIS — Z9981 Dependence on supplemental oxygen: Secondary | ICD-10-CM

## 2018-05-19 DIAGNOSIS — R079 Chest pain, unspecified: Secondary | ICD-10-CM

## 2018-05-19 DIAGNOSIS — E785 Hyperlipidemia, unspecified: Secondary | ICD-10-CM | POA: Diagnosis not present

## 2018-05-19 DIAGNOSIS — N183 Chronic kidney disease, stage 3 unspecified: Secondary | ICD-10-CM | POA: Diagnosis present

## 2018-05-19 DIAGNOSIS — Z882 Allergy status to sulfonamides status: Secondary | ICD-10-CM

## 2018-05-19 DIAGNOSIS — Z85828 Personal history of other malignant neoplasm of skin: Secondary | ICD-10-CM

## 2018-05-19 DIAGNOSIS — Z881 Allergy status to other antibiotic agents status: Secondary | ICD-10-CM

## 2018-05-19 DIAGNOSIS — E1142 Type 2 diabetes mellitus with diabetic polyneuropathy: Secondary | ICD-10-CM | POA: Diagnosis not present

## 2018-05-19 DIAGNOSIS — I255 Ischemic cardiomyopathy: Secondary | ICD-10-CM | POA: Diagnosis present

## 2018-05-19 DIAGNOSIS — Z7951 Long term (current) use of inhaled steroids: Secondary | ICD-10-CM

## 2018-05-19 DIAGNOSIS — E1165 Type 2 diabetes mellitus with hyperglycemia: Secondary | ICD-10-CM

## 2018-05-19 DIAGNOSIS — I130 Hypertensive heart and chronic kidney disease with heart failure and stage 1 through stage 4 chronic kidney disease, or unspecified chronic kidney disease: Secondary | ICD-10-CM | POA: Diagnosis present

## 2018-05-19 DIAGNOSIS — J9611 Chronic respiratory failure with hypoxia: Secondary | ICD-10-CM | POA: Diagnosis not present

## 2018-05-19 DIAGNOSIS — Z7982 Long term (current) use of aspirin: Secondary | ICD-10-CM | POA: Diagnosis not present

## 2018-05-19 DIAGNOSIS — J441 Chronic obstructive pulmonary disease with (acute) exacerbation: Secondary | ICD-10-CM | POA: Diagnosis present

## 2018-05-19 DIAGNOSIS — I214 Non-ST elevation (NSTEMI) myocardial infarction: Principal | ICD-10-CM

## 2018-05-19 DIAGNOSIS — I509 Heart failure, unspecified: Secondary | ICD-10-CM | POA: Diagnosis not present

## 2018-05-19 DIAGNOSIS — Z95810 Presence of automatic (implantable) cardiac defibrillator: Secondary | ICD-10-CM

## 2018-05-19 DIAGNOSIS — R748 Abnormal levels of other serum enzymes: Secondary | ICD-10-CM

## 2018-05-19 DIAGNOSIS — N179 Acute kidney failure, unspecified: Secondary | ICD-10-CM

## 2018-05-19 DIAGNOSIS — Z515 Encounter for palliative care: Secondary | ICD-10-CM | POA: Diagnosis not present

## 2018-05-19 DIAGNOSIS — D631 Anemia in chronic kidney disease: Secondary | ICD-10-CM | POA: Diagnosis present

## 2018-05-19 DIAGNOSIS — J449 Chronic obstructive pulmonary disease, unspecified: Secondary | ICD-10-CM | POA: Diagnosis not present

## 2018-05-19 DIAGNOSIS — Z888 Allergy status to other drugs, medicaments and biological substances status: Secondary | ICD-10-CM

## 2018-05-19 DIAGNOSIS — I361 Nonrheumatic tricuspid (valve) insufficiency: Secondary | ICD-10-CM

## 2018-05-19 DIAGNOSIS — R945 Abnormal results of liver function studies: Secondary | ICD-10-CM | POA: Diagnosis not present

## 2018-05-19 DIAGNOSIS — Z955 Presence of coronary angioplasty implant and graft: Secondary | ICD-10-CM

## 2018-05-19 DIAGNOSIS — R0602 Shortness of breath: Secondary | ICD-10-CM

## 2018-05-19 DIAGNOSIS — E119 Type 2 diabetes mellitus without complications: Secondary | ICD-10-CM

## 2018-05-19 DIAGNOSIS — Z66 Do not resuscitate: Secondary | ICD-10-CM | POA: Diagnosis present

## 2018-05-19 DIAGNOSIS — N189 Chronic kidney disease, unspecified: Secondary | ICD-10-CM

## 2018-05-19 DIAGNOSIS — F329 Major depressive disorder, single episode, unspecified: Secondary | ICD-10-CM | POA: Diagnosis present

## 2018-05-19 DIAGNOSIS — K219 Gastro-esophageal reflux disease without esophagitis: Secondary | ICD-10-CM | POA: Diagnosis present

## 2018-05-19 DIAGNOSIS — R7989 Other specified abnormal findings of blood chemistry: Secondary | ICD-10-CM

## 2018-05-19 DIAGNOSIS — I480 Paroxysmal atrial fibrillation: Secondary | ICD-10-CM | POA: Diagnosis present

## 2018-05-19 LAB — PROTIME-INR
INR: 1.28
PROTHROMBIN TIME: 15.9 s — AB (ref 11.4–15.2)

## 2018-05-19 LAB — BASIC METABOLIC PANEL
ANION GAP: 13 (ref 5–15)
BUN: 65 mg/dL — ABNORMAL HIGH (ref 8–23)
CALCIUM: 9.1 mg/dL (ref 8.9–10.3)
CO2: 24 mmol/L (ref 22–32)
Chloride: 92 mmol/L — ABNORMAL LOW (ref 98–111)
Creatinine, Ser: 2.83 mg/dL — ABNORMAL HIGH (ref 0.44–1.00)
GFR, EST AFRICAN AMERICAN: 17 mL/min — AB (ref >60)
GFR, EST NON AFRICAN AMERICAN: 15 mL/min — AB (ref >60)
GLUCOSE: 441 mg/dL — AB (ref 70–99)
POTASSIUM: 6 mmol/L — AB (ref 3.5–5.1)
SODIUM: 129 mmol/L — AB (ref 135–145)

## 2018-05-19 LAB — HEPARIN LEVEL (UNFRACTIONATED)
HEPARIN UNFRACTIONATED: 0.18 [IU]/mL — AB (ref 0.30–0.70)
Heparin Unfractionated: 0.23 IU/mL — ABNORMAL LOW (ref 0.30–0.70)

## 2018-05-19 LAB — GLUCOSE, CAPILLARY
GLUCOSE-CAPILLARY: 370 mg/dL — AB (ref 70–99)
GLUCOSE-CAPILLARY: 260 mg/dL — AB (ref 70–99)
Glucose-Capillary: 426 mg/dL — ABNORMAL HIGH (ref 70–99)
Glucose-Capillary: 411 mg/dL — ABNORMAL HIGH (ref 70–99)
Glucose-Capillary: 256 mg/dL — ABNORMAL HIGH (ref 70–99)

## 2018-05-19 LAB — HEPATIC FUNCTION PANEL
ALBUMIN: 3.6 g/dL (ref 3.5–5.0)
ALT: 20 U/L (ref 0–44)
AST: 49 U/L — AB (ref 15–41)
Alkaline Phosphatase: 65 U/L (ref 38–126)
Bilirubin, Direct: 0.5 mg/dL — ABNORMAL HIGH (ref 0.0–0.2)
Indirect Bilirubin: 1.5 mg/dL — ABNORMAL HIGH (ref 0.3–0.9)
TOTAL PROTEIN: 7.5 g/dL (ref 6.5–8.1)
Total Bilirubin: 2 mg/dL — ABNORMAL HIGH (ref 0.3–1.2)

## 2018-05-19 LAB — CBC
HCT: 33.3 % — ABNORMAL LOW (ref 36.0–46.0)
Hemoglobin: 9.8 g/dL — ABNORMAL LOW (ref 12.0–15.0)
MCH: 25.8 pg — ABNORMAL LOW (ref 26.0–34.0)
MCHC: 29.4 g/dL — ABNORMAL LOW (ref 30.0–36.0)
MCV: 87.6 fL (ref 80.0–100.0)
NRBC: 0 % (ref 0.0–0.2)
PLATELETS: 154 10*3/uL (ref 150–400)
RBC: 3.8 MIL/uL — AB (ref 3.87–5.11)
RDW: 16.2 % — ABNORMAL HIGH (ref 11.5–15.5)
WBC: 4.2 10*3/uL (ref 4.0–10.5)

## 2018-05-19 LAB — TROPONIN I
TROPONIN I: 5.68 ng/mL — AB (ref <0.03)
TROPONIN I: 5.05 ng/mL — AB (ref <0.03)
Troponin I: 5.34 ng/mL (ref <0.03)

## 2018-05-19 LAB — HEMOGLOBIN A1C
Hgb A1c MFr Bld: 6.9 % — ABNORMAL HIGH (ref 4.8–5.6)
MEAN PLASMA GLUCOSE: 151.33 mg/dL

## 2018-05-19 LAB — LACTATE DEHYDROGENASE: LDH: 294 U/L — AB (ref 98–192)

## 2018-05-19 MED ORDER — PANTOPRAZOLE SODIUM 40 MG PO TBEC
40.0000 mg | DELAYED_RELEASE_TABLET | Freq: Every day | ORAL | Status: DC
  Administered 2018-05-19 – 2018-05-22 (×4): 40 mg via ORAL
  Filled 2018-05-19 (×4): qty 1

## 2018-05-19 MED ORDER — HYPROMELLOSE (GONIOSCOPIC) 2.5 % OP SOLN
1.0000 [drp] | Freq: Two times a day (BID) | OPHTHALMIC | Status: DC | PRN
  Filled 2018-05-19: qty 15

## 2018-05-19 MED ORDER — TECHNETIUM TC 99M DIETHYLENETRIAME-PENTAACETIC ACID
31.6000 | Freq: Once | INTRAVENOUS | Status: AC | PRN
  Administered 2018-05-19: 31.6 via RESPIRATORY_TRACT

## 2018-05-19 MED ORDER — IPRATROPIUM-ALBUTEROL 0.5-2.5 (3) MG/3ML IN SOLN: 3.0000 mL | Freq: Four times a day (QID) | RESPIRATORY_TRACT | Status: DC

## 2018-05-19 MED ORDER — IPRATROPIUM-ALBUTEROL 0.5-2.5 (3) MG/3ML IN SOLN: 3.0000 mL | RESPIRATORY_TRACT | Status: DC | PRN

## 2018-05-19 MED ORDER — INSULIN ASPART 100 UNIT/ML ~~LOC~~ SOLN
17.0000 [IU] | Freq: Once | SUBCUTANEOUS | Status: AC
  Administered 2018-05-19: 17 [IU] via SUBCUTANEOUS

## 2018-05-19 MED ORDER — TECHNETIUM TO 99M ALBUMIN AGGREGATED
4.3000 | Freq: Once | INTRAVENOUS | Status: AC | PRN
  Administered 2018-05-19: 4.3 via INTRAVENOUS

## 2018-05-19 MED ORDER — INSULIN ASPART 100 UNIT/ML ~~LOC~~ SOLN: 0.0000 [IU] | Freq: Three times a day (TID) | SUBCUTANEOUS | Status: DC

## 2018-05-19 MED ORDER — HEPARIN BOLUS VIA INFUSION
4000.0000 [IU] | Freq: Once | INTRAVENOUS | Status: AC
  Administered 2018-05-19: 4000 [IU] via INTRAVENOUS
  Filled 2018-05-19: qty 4000

## 2018-05-19 MED ORDER — MORPHINE SULFATE (PF) 2 MG/ML IV SOLN
1.0000 mg | INTRAVENOUS | Status: DC | PRN
  Administered 2018-05-19 – 2018-05-22 (×4): 2 mg via INTRAVENOUS
  Filled 2018-05-19 (×4): qty 1

## 2018-05-19 MED ORDER — INSULIN GLARGINE 100 UNIT/ML ~~LOC~~ SOLN
50.0000 [IU] | Freq: Two times a day (BID) | SUBCUTANEOUS | Status: DC
  Administered 2018-05-19 – 2018-05-21 (×5): 50 [IU] via SUBCUTANEOUS
  Filled 2018-05-19 (×5): qty 0.5

## 2018-05-19 MED ORDER — MOMETASONE FURO-FORMOTEROL FUM 100-5 MCG/ACT IN AERO
2.0000 | INHALATION_SPRAY | Freq: Two times a day (BID) | RESPIRATORY_TRACT | Status: DC
  Administered 2018-05-19 – 2018-05-22 (×6): 2 via RESPIRATORY_TRACT
  Filled 2018-05-19: qty 8.8

## 2018-05-19 MED ORDER — INSULIN ASPART 100 UNIT/ML ~~LOC~~ SOLN
0.0000 [IU] | Freq: Three times a day (TID) | SUBCUTANEOUS | Status: DC
  Administered 2018-05-19: 8 [IU] via SUBCUTANEOUS
  Administered 2018-05-20 (×2): 11 [IU] via SUBCUTANEOUS
  Administered 2018-05-20: 5 [IU] via SUBCUTANEOUS
  Administered 2018-05-21: 8 [IU] via SUBCUTANEOUS
  Administered 2018-05-21: 3 [IU] via SUBCUTANEOUS
  Administered 2018-05-21: 5 [IU] via SUBCUTANEOUS
  Administered 2018-05-22: 3 [IU] via SUBCUTANEOUS
  Administered 2018-05-22: 11 [IU] via SUBCUTANEOUS

## 2018-05-19 MED ORDER — IPRATROPIUM-ALBUTEROL 0.5-2.5 (3) MG/3ML IN SOLN
3.0000 mL | Freq: Four times a day (QID) | RESPIRATORY_TRACT | Status: DC
  Administered 2018-05-19 – 2018-05-20 (×5): 3 mL via RESPIRATORY_TRACT
  Filled 2018-05-19 (×5): qty 3

## 2018-05-19 MED ORDER — PERFLUTREN LIPID MICROSPHERE
4.0000 mL | Freq: Once | INTRAVENOUS | Status: AC
  Administered 2018-05-19: 4 mL via INTRAVENOUS

## 2018-05-19 MED ORDER — ALPRAZOLAM 0.25 MG PO TABS
0.2500 mg | ORAL_TABLET | Freq: Every evening | ORAL | Status: DC | PRN
  Administered 2018-05-19 – 2018-05-21 (×3): 0.25 mg via ORAL
  Filled 2018-05-19 (×3): qty 1

## 2018-05-19 MED ORDER — POLYVINYL ALCOHOL 1.4 % OP SOLN
1.0000 [drp] | OPHTHALMIC | Status: DC | PRN
  Administered 2018-05-20: 1 [drp] via OPHTHALMIC
  Filled 2018-05-19: qty 15

## 2018-05-19 MED ORDER — ALUM & MAG HYDROXIDE-SIMETH 200-200-20 MG/5ML PO SUSP: 15.0000 mL | Freq: Four times a day (QID) | ORAL | Status: DC | PRN

## 2018-05-19 MED ORDER — ASPIRIN 81 MG PO CHEW
81.0000 mg | CHEWABLE_TABLET | Freq: Every day | ORAL | Status: DC
  Administered 2018-05-19 – 2018-05-22 (×4): 81 mg via ORAL
  Filled 2018-05-19 (×4): qty 1

## 2018-05-19 MED ORDER — FUROSEMIDE 10 MG/ML IJ SOLN
40.0000 mg | Freq: Two times a day (BID) | INTRAMUSCULAR | Status: DC
  Administered 2018-05-19 – 2018-05-22 (×7): 40 mg via INTRAVENOUS
  Filled 2018-05-19 (×7): qty 4

## 2018-05-19 MED ORDER — HEPARIN (PORCINE) 25000 UT/250ML-% IV SOLN
1050.0000 [IU]/h | INTRAVENOUS | Status: DC
  Administered 2018-05-19: 900 [IU]/h via INTRAVENOUS
  Administered 2018-05-19: 1050 [IU]/h via INTRAVENOUS
  Administered 2018-05-20: 1200 [IU]/h via INTRAVENOUS
  Filled 2018-05-19 (×4): qty 250

## 2018-05-19 MED ORDER — ACETAMINOPHEN 325 MG PO TABS: 650.0000 mg | ORAL_TABLET | Freq: Four times a day (QID) | ORAL | Status: DC | PRN

## 2018-05-19 MED ORDER — CARVEDILOL 3.125 MG PO TABS
3.1250 mg | ORAL_TABLET | Freq: Two times a day (BID) | ORAL | Status: DC
  Administered 2018-05-19 – 2018-05-22 (×7): 3.125 mg via ORAL
  Filled 2018-05-19 (×7): qty 1

## 2018-05-19 MED ORDER — OXYCODONE HCL 5 MG PO TABS
5.0000 mg | ORAL_TABLET | Freq: Four times a day (QID) | ORAL | Status: DC | PRN
  Administered 2018-05-19 – 2018-05-22 (×9): 5 mg via ORAL
  Filled 2018-05-19 (×9): qty 1

## 2018-05-19 MED ORDER — ACETAMINOPHEN 650 MG RE SUPP: 650.0000 mg | Freq: Four times a day (QID) | RECTAL | Status: DC | PRN

## 2018-05-19 MED ORDER — PREDNISONE 20 MG PO TABS
40.0000 mg | ORAL_TABLET | Freq: Every day | ORAL | Status: DC
  Administered 2018-05-19 – 2018-05-22 (×4): 40 mg via ORAL
  Filled 2018-05-19 (×4): qty 2

## 2018-05-19 NOTE — Progress Notes (Signed)
Chart reviewed along with fellow consult note this morning. Markedly elevated troponin which is stable, however, significantly elevated creatinine with hyperkalemia which would preclude invasive evaluation at this point. Agree with IV heparin - patient is chest pain free. Would proceed with echo today (last study in 2018 showed EF 45%) and we will see in rounds again tomorrow.   Pixie Casino, MD, [facility], [place] Director of the Advanced Lipid Disorders &  Cardiovascular Risk Reduction Clinic Diplomate of the American Board of Clinical Lipidology Attending Cardiologist  Direct Dial: [phone]  Fax: [phone]  Website:  [email or web address]

## 2018-05-19 NOTE — Progress Notes (Signed)
Patient ID: [name], female   DOB: 04-02-1934, 84 y.o.   MRN: [id number] Patient was admitted early this morning from [facility] rocking him for shortness of breath and chest pain.  Patient seen and examined at bedside and plan of care discussed with her.  I have reviewed patient's medical records and this morning's H&P myself.  Reviewed current vitals and medications.  Troponin this morning was 5.68 but patient was not having any chest pain.  Cardiology was notified.  Continue heparin drip for now.  Will wait for further recommendations from cardiology.  Repeat a.m. Labs.

## 2018-05-19 NOTE — Progress Notes (Signed)
ANTICOAGULATION CONSULT NOTE - Initial Consult  Pharmacy Consult for Heparin  Indication: chest pain/ACS and and rule out PE  Allergies  Allergen Reactions  . Augmentin [Amoxicillin-Pot Clavulanate]     Abdominal pain  . Metformin     Upset stomach  . Metolazone     "made everything hurt" 07/03/15  . Sulfonamide Derivatives     swelling   Patient Measurements: Height: 5\' 5"  (165.1 cm) Weight: 160 lb 9.6 oz (72.8 kg) IBW/kg (Calculated) : 57  Vital Signs: Temp: 97.7 F (36.5 C) (11/23 0307) Temp Source: Oral (11/23 0307) BP: 116/70 (11/23 0307) Pulse Rate: 86 (11/23 0307)  Labs: Recent Labs    05/19/18 0534  HGB 9.8*  HCT 33.3*  PLT 154  LABPROT 15.9*  INR 1.28    CrCl cannot be calculated (Patient's most recent lab result is older than the maximum 21 days allowed.).   Medical History: Past Medical History:  Diagnosis Date  . Aneurysm ([place])    Apical; previously on Coumadin  . Anxiety disorder   . Cancer (HCC)    Skin  . Chronic renal insufficiency    Creatinine 1.17 January 2010  . Coronary artery disease    Cath 02/15/12 revealed RCA as culprit lesion w/ occlusion at site of prior stent, patent LAD stent, chronic LCx and Diagonal dz; Medical therapy   . Depression   . GERD (gastroesophageal reflux disease)   . Hyperlipidemia    H/o GI intolerance to Lipitor  . Impingement syndrome of left shoulder   . Ischemic cardiomyopathy    s/p Medtronic BiV ICD 2006  . Peripheral polyneuropathy    Refractory to several medications  . Systolic and diastolic CHF, chronic (HCC)    echo 03/2012 EF 34-91%, mod diastolic dysfunction, mod MR, mod LAE, mod-severe TR, PASP 39mmHg  . Type 2 diabetes mellitus (HCC)     [name] transfer from [facility] with chest pain and shortness of breath, starting heparin per pharmacy, getting VQ to rule out PE, Hgb 9.8, Plts 154, INR is 1.28  Goal of Therapy:  Heparin level 0.3-0.7 units/ml Monitor platelets by anticoagulation  protocol: Yes   Plan:  Heparin 4000 units BOLUS Start heparin drip at 900 units/hr 1400 HL Daily CBC/HL Monitor for bleeding   Narda Bonds 05/19/2018,6:57 AM

## 2018-05-19 NOTE — Progress Notes (Signed)
  Echocardiogram 2D Echocardiogram has been performed.  Madelaine Etienne 05/19/2018, 2:53 PM

## 2018-05-19 NOTE — Progress Notes (Signed)
ANTICOAGULATION CONSULT NOTE   Pharmacy Consult for Heparin  Indication: chest pain/ACS and and rule out PE  Allergies  Allergen Reactions  . Augmentin [Amoxicillin-Pot Clavulanate]     Abdominal pain  . Metformin     Upset stomach  . Metolazone     "made everything hurt" 07/03/15  . Sulfonamide Derivatives     swelling   Patient Measurements: Height: 5\' 5"  (165.1 cm) Weight: 160 lb 9.6 oz (72.8 kg) IBW/kg (Calculated) : 57  Vital Signs: Temp: 97.7 F (36.5 C) (11/23 0307) Temp Source: Oral (11/23 1150) BP: 130/67 (11/23 1150) Pulse Rate: 78 (11/23 1150)  Labs: Recent Labs    05/19/18 0534 05/19/18 1038 05/19/18 1345  HGB 9.8*  --   --   HCT 33.3*  --   --   PLT 154  --   --   LABPROT 15.9*  --   --   INR 1.28  --   --   HEPARINUNFRC  --   --  0.18*  CREATININE 2.83*  --   --   TROPONINI 5.68* 5.05*  --     Estimated Creatinine Clearance: 15.3 mL/min (A) (by C-G formula based on SCr of 2.83 mg/dL (H)).   Medical History: Past Medical History:  Diagnosis Date  . Aneurysm ([place])    Apical; previously on Coumadin  . Anxiety disorder   . Cancer (HCC)    Skin  . Chronic renal insufficiency    Creatinine 1.17 January 2010  . Coronary artery disease    Cath 02/15/12 revealed RCA as culprit lesion w/ occlusion at site of prior stent, patent LAD stent, chronic LCx and Diagonal dz; Medical therapy   . Depression   . GERD (gastroesophageal reflux disease)   . Hyperlipidemia    H/o GI intolerance to Lipitor  . Impingement syndrome of left shoulder   . Ischemic cardiomyopathy    s/p Medtronic BiV ICD 2006  . Peripheral polyneuropathy    Refractory to several medications  . Systolic and diastolic CHF, chronic (HCC)    echo 03/2012 EF 85-46%, mod diastolic dysfunction, mod MR, mod LAE, mod-severe TR, PASP 61mmHg  . Type 2 diabetes mellitus [facility])     [name] transfer from [facility] with chest pain and shortness of breath, starting heparin per pharmacy.    VQ scan showing very low probability for PE. Hgb 9.8, plt 154. Trop peaked at 5.68- now trending down to 5.05.   Heparin level came back subtherapeutic at 0.18, on 900 units/hr. No s/sx of bleeding. No infusion issues.   Goal of Therapy:  Heparin level 0.3-0.7 units/ml Monitor platelets by anticoagulation protocol: Yes   Plan:  Increase heparin drip to 1050 units/hr Order heparin level in 8 hours Daily CBC/HL Monitor for bleeding  Antonietta Jewel, PharmD Clinical Pharmacist  Pager: [phone] Phone: [phone] 05/19/2018,2:31 PM

## 2018-05-19 NOTE — Consult Note (Signed)
Cardiology Consult    Patient ID: CHIZARA MENA MRN: [id number], DOB/AGE: [birth year]   Admit date: 05/19/2018 Date of Consult: 05/19/2018  Primary Physician: Loman Brooklyn, FNP Primary Cardiologist: Kate Sable, MD   Patient Profile    [name] is a 84 y.o. female with a history of CHF, A. fib, CAD status post PCI and diabetes.  Presents with acute shortness of breath in the setting of heart failure exacerbation Past Medical History   Past Medical History:  Diagnosis Date  . Aneurysm ([place])    Apical; previously on Coumadin  . Anxiety disorder   . Cancer (HCC)    Skin  . Chronic renal insufficiency    Creatinine 1.17 January 2010  . Coronary artery disease    Cath 02/15/12 revealed RCA as culprit lesion w/ occlusion at site of prior stent, patent LAD stent, chronic LCx and Diagonal dz; Medical therapy   . Depression   . GERD (gastroesophageal reflux disease)   . Hyperlipidemia    H/o GI intolerance to Lipitor  . Impingement syndrome of left shoulder   . Ischemic cardiomyopathy    s/p Medtronic BiV ICD 2006  . Peripheral polyneuropathy    Refractory to several medications  . Systolic and diastolic CHF, chronic (HCC)    echo 03/2012 EF 72-09%, mod diastolic dysfunction, mod MR, mod LAE, mod-severe TR, PASP 41mmHg  . Type 2 diabetes mellitus ([place])     Past Surgical History:  Procedure Laterality Date  . ABDOMINAL HYSTERECTOMY    . CARDIAC DEFIBRILLATOR PLACEMENT  05/28/2009   Medtronic ICD placed by Dr. Caryl Comes secondary to ICM/CHF, [phone] Fidelis lead fracture with ICD storm 2010 requiring new RV lead placement  . CHOLECYSTECTOMY    . EYE SURGERY    . IMPLANTABLE CARDIOVERTER DEFIBRILLATOR GENERATOR CHANGE  09/26/13   BiV ICD generator change by Dr Rayann Heman - MDT [place] XT CRT-D  . IMPLANTABLE CARDIOVERTER DEFIBRILLATOR GENERATOR CHANGE N/A 09/26/2013   Procedure: IMPLANTABLE CARDIOVERTER DEFIBRILLATOR GENERATOR CHANGE;  Surgeon: Coralyn Mark, MD;   Location: [facility] CATH LAB;  Service: Cardiovascular;  Laterality: N/A;  . LEFT HEART CATHETERIZATION WITH CORONARY ANGIOGRAM N/A 02/15/2012   Procedure: LEFT HEART CATHETERIZATION WITH CORONARY ANGIOGRAM;  Surgeon: Peter M Martinique, MD;  Location: [facility] CATH LAB;  Service: Cardiovascular;  Laterality: N/A;  . RIGHT HEART CATHETERIZATION N/A 04/27/2012   Procedure: RIGHT HEART CATH;  Surgeon: Larey Dresser, MD;  Location: [facility] CATH LAB;  Service: Cardiovascular;  Laterality: N/A;     Allergies  Allergies  Allergen Reactions  . Augmentin [Amoxicillin-Pot Clavulanate]     Abdominal pain  . Metformin     Upset stomach  . Metolazone     "made everything hurt" 07/03/15  . Sulfonamide Derivatives     swelling    History of Present Illness    84 year old woman with a past medical history of COPD, ischemic cardiomyopathy with an EF 50% last documented in 2016 , ICD in place, diabetes, A. fib.  She had multiple PCI 5+ years ago.  ICD is in place but she tells me that the shock coil is nonfunctioning and thus the defibrillation function is deactivated.  She presents an outside hospital with a 3-day history of orthopnea and increasing shortness of breath.  She denies any chest pain.  On arrival to the outside hospital ED she was found to have acute hypoxia with sats dropping down to the 70s requiring 3 L of oxygen.  proBNP was 29,000.  Troponin was  elevated and rising to 2.3 prior to transfer.  Creatinine was found to be 2 which is above her baseline.  She was given 80 IV Lasix, started on oxygen and transferred over here.  CT scan was not done to rule out a PE given her creatinine.  Inpatient Medications      Family History    Family History  Problem Relation Age of Onset  . Heart attack Father   . Heart disease Sister   . Heart disease Brother   . Coronary artery disease Neg Hx    She indicated that her father is deceased. She indicated that the status of her sister is unknown. She indicated that  the status of her brother is unknown. She indicated that the status of her neg hx is unknown. She indicated that her other is alive.   Social History    Social History   Socioeconomic History  . Marital status: Widowed    Spouse name: Not on file  . Number of children: Not on file  . Years of education: Not on file  . Highest education level: Not on file  Occupational History  . Occupation: Retired    Fish farm manager: RETIRED  Social Needs  . Financial resource strain: Not on file  . Food insecurity:    Worry: Not on file    Inability: Not on file  . Transportation needs:    Medical: Not on file    Non-medical: Not on file  Tobacco Use  . Smoking status: Former Smoker    Packs/day: 0.80    Years: 30.00    Pack years: 24.00    Types: Cigarettes    Start date: 04/05/1957    Last attempt to quit: 06/27/2002    Years since quitting: 15.9  . Smokeless tobacco: Never Used  Substance and Sexual Activity  . Alcohol use: No    Alcohol/week: 0.0 standard drinks  . Drug use: No  . Sexual activity: Not Currently  Lifestyle  . Physical activity:    Days per week: Not on file    Minutes per session: Not on file  . Stress: Not on file  Relationships  . Social connections:    Talks on phone: Not on file    Gets together: Not on file    Attends religious service: Not on file    Active member of club or organization: Not on file    Attends meetings of clubs or organizations: Not on file    Relationship status: Not on file  . Intimate partner violence:    Fear of current or ex partner: Not on file    Emotionally abused: Not on file    Physically abused: Not on file    Forced sexual activity: Not on file  Other Topics Concern  . Not on file  Social History Narrative  . Not on file     Review of Systems    General:  No chills, fever, night sweats or weight changes.  Cardiovascular:  No chest pain, dyspnea on exertion, edema, orthopnea, palpitations, paroxysmal nocturnal  dyspnea. Dermatological: No rash, lesions/masses Respiratory: No cough, dyspnea Urologic: No hematuria, dysuria Abdominal:   No nausea, vomiting, diarrhea, bright red blood per rectum, melena, or hematemesis Neurologic:  No visual changes, wkns, changes in mental status. All other systems reviewed and are otherwise negative except as noted above.  Physical Exam    There were no vitals taken for this visit.  General: Mild distress Psych: Normal affect. Neuro: Alert and  oriented X 3. Moves all extremities spontaneously. HEENT: Normal  Neck: JVP is elevated Lungs:  Resp regular and unlabored, CTA. Heart: RRR no s3, s4, or murmurs. Abdomen: Soft, non-tender, non-distended, BS + x 4.  Extremities: No clubbing, cyanosis or edema. DP/PT/Radials 2+ and equal bilaterally.  Labs    Troponin (Point of Care Test) No results for input(s): TROPIPOC in the last 72 hours. No results for input(s): CKTOTAL, CKMB, TROPONINI in the last 72 hours. Lab Results  Component Value Date   WBC 5.5 11/28/2017   HGB 10.5 (L) 11/28/2017   HCT 35.1 (L) 11/28/2017   MCV 89.1 11/28/2017   PLT 225 11/28/2017   No results for input(s): NA, K, CL, CO2, BUN, CREATININE, CALCIUM, PROT, BILITOT, ALKPHOS, ALT, AST, GLUCOSE in the last 168 hours.  Invalid input(s): LABALBU Lab Results  Component Value Date   CHOL 146 06/06/2015   HDL 20 (L) 06/06/2015   LDLCALC 68 06/06/2015   TRIG 289 (H) 06/06/2015   No results found for: [facility]   Radiology Studies    Nm Pet Image Initial (pi) Skull Base To Thigh  Result Date: 05/17/2018 CLINICAL DATA:  Initial treatment strategy for head neck carcinoma. EXAM: NUCLEAR MEDICINE PET SKULL BASE TO THIGH TECHNIQUE: 8.6 mCi F-18 FDG was injected intravenously. Full-ring PET imaging was performed from the skull base to thigh after the radiotracer. CT data was obtained and used for attenuation correction and anatomic localization. Fasting blood glucose: 156 mg/dl COMPARISON:   Maxillary CT 02/10/2018 FINDINGS: Mediastinal blood pool activity: SUV max 2.3 NECK: Hypermetabolic activity localizing to the RIGHT hard palate with SUV max equal 5.1 (image 22 fused data) with well as coronal image 237 fused data set No hypermetabolic cervical lymph nodes. Incidental CT findings: none CHEST: Hypermetabolic LEFT axillary lymph node which is small SUV max equal 3.2. Probably misregistration with activity posterior to a rounded lymph node measuring 10 mm (image 61/4). No additional hypermetabolic lymph nodes. Second hypermetabolic nodule in the medial aspect of the LEFT breast with SUV max equal 3.8. This nodule measures 16 mm (image 86/4). Incidental CT findings: .Large heart. LEFT-sided pacemaker bilateral small effusions. ABDOMEN/PELVIS: No abnormal hypermetabolic activity within the liver, pancreas, adrenal glands, or spleen. No hypermetabolic lymph nodes in the abdomen or pelvis. Incidental CT findings: Several round lymph nodes LEFT of the aorta without significant metabolic activity. Example lymph node LEFT of the aortic bifurcation measuring 9 mm (image 151/4) SKELETON: There is intense metabolic activity along the RIGHT SI joint with SUV max equal 6.4. There is soft tissue fullness within the inferior margin of the SI joints (image 169/4) which is hypermetabolic. This finding is similar to CT 08/16/2017. Additionally there is intense activity the facet joint posteriorly at L4-L5 with intense metabolic activity. There is soft tissue in the joint space similar to the RIGHT SI joint. This is also hypermetabolic SUV max equal 7.7 Incidental CT findings: none IMPRESSION: 1. Focal activity in the RIGHT hard palate corresponds with history of head neck cancer. 2. No evidence of metastatic adenopathy in the neck. 3. Hypermetabolic nodule in the medial LEFT breast and hypermetabolic LEFT axial lymph node. Recommend correlation with mammogram and potentially ultrasound. 4. No evidence of distant  metastasis. 5. Focal metabolic activity along the RIGHT SI joint and the facet joints posteriorly at L4-L5. There is pannus in the joint suggesting a chronic inflammatory process. Malignancy is much less favored. Electronically Signed   By: Suzy Bouchard M.D.   On: 05/17/2018 14:56  ECG & Cardiac Imaging    Sensed V paced at rate of 70.- personally reviewed.  Assessment & Plan    Shortness of breath: Pressure diagnosis includes acute heart failure exacerbation, COPD exacerbation and a PE.  Given the history of ischemic cardiomyopathy elevated neck veins, elevated protein anti-proBNP and troponins I suspect that this is an acute heart failure episode.  This could be driven by an ischemic event or medication/dietary noncompliance.  He has not had a coronary evaluation in many years.  Last nuclear stress in 2016.   Recommendations: -Treat with heparin drip -Would recommend additional Lasix IV -Okay to continue home carvedilol -Perform a VQ scan to rule out a PE -Would require ischemic evaluation ideally prior to discharge.  I presume her creatinine will keep coming down once we decongestant more. -Could consider checking lactate to stage her risk  Signed, Cristina Gong, MD 05/19/2018, 2:22 AM  For questions or updates, please contact   Please consult [email or web address] for contact info under Cardiology/STEMI.

## 2018-05-19 NOTE — Progress Notes (Signed)
ANTICOAGULATION CONSULT NOTE   Pharmacy Consult for Heparin  Indication: chest pain/ACS and and rule out PE  Allergies  Allergen Reactions  . Augmentin [Amoxicillin-Pot Clavulanate]     Abdominal pain  . Metformin     Upset stomach  . Metolazone     "made everything hurt" 07/03/15  . Sulfonamide Derivatives     swelling   Patient Measurements: Height: 5\' 5"  (165.1 cm) Weight: 160 lb 9.6 oz (72.8 kg) IBW/kg (Calculated) : 57  Vital Signs: Temp: 98.2 F (36.8 C) (11/23 2101) Temp Source: Oral (11/23 2101) BP: 101/55 (11/23 2101) Pulse Rate: 72 (11/23 2101)  Labs: Recent Labs    05/19/18 0534 05/19/18 1038 05/19/18 1345 05/19/18 1633 05/19/18 2303  HGB 9.8*  --   --   --   --   HCT 33.3*  --   --   --   --   PLT 154  --   --   --   --   LABPROT 15.9*  --   --   --   --   INR 1.28  --   --   --   --   HEPARINUNFRC  --   --  0.18*  --  0.23*  CREATININE 2.83*  --   --   --   --   TROPONINI 5.68* 5.05*  --  5.34*  --     Estimated Creatinine Clearance: 15.3 mL/min (A) (by C-G formula based on SCr of 2.83 mg/dL (H)).   Medical History: Past Medical History:  Diagnosis Date  . Aneurysm ([place])    Apical; previously on Coumadin  . Anxiety disorder   . Cancer (HCC)    Skin  . Chronic renal insufficiency    Creatinine 1.17 January 2010  . Coronary artery disease    Cath 02/15/12 revealed RCA as culprit lesion w/ occlusion at site of prior stent, patent LAD stent, chronic LCx and Diagonal dz; Medical therapy   . Depression   . GERD (gastroesophageal reflux disease)   . Hyperlipidemia    H/o GI intolerance to Lipitor  . Impingement syndrome of left shoulder   . Ischemic cardiomyopathy    s/p Medtronic BiV ICD 2006  . Peripheral polyneuropathy    Refractory to several medications  . Systolic and diastolic CHF, chronic (HCC)    echo 03/2012 EF 35-00%, mod diastolic dysfunction, mod MR, mod LAE, mod-severe TR, PASP 19mmHg  . Type 2 diabetes mellitus [facility])      [name] transfer from [facility] with chest pain and shortness of breath, starting heparin per pharmacy, getting VQ to rule out PE, Hgb 9.8, Plts 154, INR is 1.28  11/23 PM update: heparin level remains low, troponin elevated ~5, no issues per RN.   Goal of Therapy:  Heparin level 0.3-0.7 units/ml Monitor platelets by anticoagulation protocol: Yes   Plan:  Increase heparin drip to 1200 units/hr Re-check heparin level in 6-8 hours  Narda Bonds 05/19/2018,11:53 PM

## 2018-05-19 NOTE — Progress Notes (Signed)
Critical Result - Troponin = 5.68. CBG = 426. Dr. Starla Link notified.

## 2018-05-19 NOTE — H&P (Addendum)
History and Physical    [name] BSJ:[id number] DOB: 08-10-1933 DOA: 05/19/2018  PCP: Loman Brooklyn, FNP Patient coming from: [facility]  Chief Complaint: Shortness of breath, chest pain  HPI: [name] is a 84 y.o. female with medical history significant of COPD on 2 L oxygen, chronic combined systolic and diastolic congestive heart failure, ischemic cardiomyopathy, status post AICD, CAD, type 2 diabetes, hyperlipidemia, CKD presenting to the hospital as a transfer from [facility] for evaluation of shortness of breath and chest pain.  Patient is a poor historian and it was difficult to obtain a thorough history from her. Reports a 3-day history of dyspnea, orthopnea.  Denies having any lower extremity edema.  She is currently taking Lasix 40 mg in the morning and 20 mg at lunch.  She is not taking metolazone.  Denies having increased dietary sodium or fluid intake.  Also reports having substernal chest pain for the past 3 days which has been constant. States it is associated with nausea.  Not associated with diaphoresis.  States her chest pain resolved after she received medications in the ED.  States she has COPD and uses 2 L oxygen chronically.  She has been using her home inhalers but continues to have wheezing.  She is not coughing much.  Denies having any abdominal pain, vomiting, diarrhea, or constipation.  ED Course at [facility]:  Troponin 1.81 > 2.27, EKG with heart rate 89 and paced rhythm.  D-dimer 1.04, proBNP 29,322 BUN 51 and creatinine 2.0. LFTs: AST 67, T bili 2.3, ALT normal, alk phos normal. UA not suggestive of infection ABG showing pH 7.49, PCO2 37, PO2 76. Chest x-ray showing stable cardiomegaly and pulmonary vascular congestion. She was given albuterol/Atrovent breathing treatments, 125 mg Solu-Medrol, 80 mg IV Lasix  Review of Systems: As per HPI otherwise 10 point review of systems negative.  Past Medical History:  Diagnosis  Date  . Aneurysm ([place])    Apical; previously on Coumadin  . Anxiety disorder   . Cancer (HCC)    Skin  . Chronic renal insufficiency    Creatinine 1.17 January 2010  . Coronary artery disease    Cath 02/15/12 revealed RCA as culprit lesion w/ occlusion at site of prior stent, patent LAD stent, chronic LCx and Diagonal dz; Medical therapy   . Depression   . GERD (gastroesophageal reflux disease)   . Hyperlipidemia    H/o GI intolerance to Lipitor  . Impingement syndrome of left shoulder   . Ischemic cardiomyopathy    s/p Medtronic BiV ICD 2006  . Peripheral polyneuropathy    Refractory to several medications  . Systolic and diastolic CHF, chronic (HCC)    echo 03/2012 EF 76-54%, mod diastolic dysfunction, mod MR, mod LAE, mod-severe TR, PASP 44mHg  . Type 2 diabetes mellitus ([place]     Past Surgical History:  Procedure Laterality Date  . ABDOMINAL HYSTERECTOMY    . CARDIAC DEFIBRILLATOR PLACEMENT  05/28/2009   Medtronic ICD placed by Dr. KCaryl Comessecondary to ICM/CHF, [phone] lead fracture with ICD storm 2010 requiring new RV lead placement  . CHOLECYSTECTOMY    . EYE SURGERY    . IMPLANTABLE CARDIOVERTER DEFIBRILLATOR GENERATOR CHANGE  09/26/13   BiV ICD generator change by Dr ARayann Heman- MDT VPershing ProudXT CRT-D  . IMPLANTABLE CARDIOVERTER DEFIBRILLATOR GENERATOR CHANGE N/A 09/26/2013   Procedure: IMPLANTABLE CARDIOVERTER DEFIBRILLATOR GENERATOR CHANGE;  Surgeon: JCoralyn Mark MD;  [facility];  Service: Cardiovascular;  Laterality:  N/A;  . LEFT HEART CATHETERIZATION WITH CORONARY ANGIOGRAM N/A 02/15/2012   Procedure: LEFT HEART CATHETERIZATION WITH CORONARY ANGIOGRAM;  Surgeon: Peter M Martinique, MD;  Location: [facility] CATH LAB;  Service: Cardiovascular;  Laterality: N/A;  . RIGHT HEART CATHETERIZATION N/A 04/27/2012   Procedure: RIGHT HEART CATH;  Surgeon: Larey Dresser, MD;  Location: [facility] CATH LAB;  Service: Cardiovascular;  Laterality: N/A;     reports that she quit smoking about 15  years ago. Her smoking use included cigarettes. She started smoking about 61 years ago. She has a 24.00 pack-year smoking history. She has never used smokeless tobacco. She reports that she does not drink alcohol or use drugs.  Allergies  Allergen Reactions  . Augmentin [Amoxicillin-Pot Clavulanate]     Abdominal pain  . Metformin     Upset stomach  . Metolazone     "made everything hurt" 07/03/15  . Sulfonamide Derivatives     swelling    Family History  Problem Relation Age of Onset  . Heart attack Father   . Heart disease Sister   . Heart disease Brother   . Coronary artery disease Neg Hx     Prior to Admission medications   Medication Sig Start Date End Date Taking? Authorizing Provider  albuterol (ACCUNEB) 0.63 MG/3ML nebulizer solution Take 1 ampule by nebulization every 6 (six) hours as needed for wheezing.    [provider]  allopurinol (ZYLOPRIM) 100 MG tablet Take 200 mg by mouth daily.     [provider]  ALPRAZolam Duanne Moron) 0.25 MG tablet Take 0.25 mg by mouth at bedtime as needed for sleep.    [provider]  Ascorbic Acid (VITAMIN C) 500 MG CAPS Take 100 mg by mouth daily.     [provider]  aspirin 81 MG chewable tablet Chew 1 tablet (81 mg total) by mouth daily. 06/06/15   Rexene Alberts, MD  carvedilol (COREG) 3.125 MG tablet Take 1 tablet (3.125 mg total) by mouth 2 (two) times daily with a meal. 09/15/17   Herminio Commons, MD  citalopram (CELEXA) 20 MG tablet Take 20 mg by mouth daily.     [provider]  cyanocobalamin 2000 MCG tablet Take 2,000 mcg by mouth daily.    [provider]  ergocalciferol (VITAMIN D2) 50000 units capsule Take 50,000 Units by mouth 2 (two) times a week.    [provider]  HYDROmorphone (DILAUDID) 4 MG tablet Take 4 mg by mouth 2 (two) times daily as needed for severe pain.    [provider]  insulin glargine (LANTUS) 100 unit/mL SOPN Inject 50 Units into  the skin 2 (two) times daily. 50 units in the morning and 50 units in the evening    [provider]  lactulose (CHRONULAC) 10 GM/15ML solution Take by mouth. 15 mls daily    [provider]  metolazone (ZAROXOLYN) 2.5 MG tablet Take 1 tablet (2.5 mg total) by mouth as needed (5 days per week per Dr. Bronson Ing). 03/14/18   Herminio Commons, MD  nitroGLYCERIN (NITROSTAT) 0.4 MG SL tablet Place 1 tablet (0.4 mg total) under the tongue every 5 (five) minutes as needed for chest pain. 06/14/13   Minus Breeding, MD  ondansetron (ZOFRAN) 4 MG tablet Take 4 mg by mouth every 8 (eight) hours as needed for nausea or vomiting.    [provider]  OXYGEN Inhale 2 L into the lungs continuous.    [provider]  potassium chloride SA (  K-DUR,KLOR-CON) 20 MEQ tablet Take 1 tablet (20 mEq total) by mouth daily. 03/14/18   Herminio Commons, MD  ranitidine (ZANTAC) 150 MG tablet Take 150 mg by mouth daily. At [place]    [provider]  torsemide (DEMADEX) 20 MG tablet Take 2 tablets (40 mg total) by mouth every morning. & 20 mg in the evening 03/20/18   Herminio Commons, MD    Physical Exam: Vitals:   05/19/18 0307  BP: 116/70  Pulse: 86  Resp: 20  Temp: 97.7 F (36.5 C)  TempSrc: Oral  SpO2: 95%  Weight: 72.8 kg  Height: '5\' 5"'$  (1.651 m)    Physical Exam  Constitutional: She is oriented to person, place, and time. She appears well-developed and well-nourished. No distress.  Sitting up comfortably in a hospital bed.  No acute distress.  HENT:  Head: Normocephalic.  Mouth/Throat: Oropharynx is clear and moist.  Eyes: Right eye exhibits no discharge. Left eye exhibits no discharge.  Neck: JVD present.  Cardiovascular: Normal rate, regular rhythm and intact distal pulses.  Pulmonary/Chest: Effort normal. She has no wheezes. She has rales.  No increased work of breathing Speaking clearly in full sentences On 2 L supplemental oxygen (same as home  requirement) Basilar rales  Abdominal: Soft. Bowel sounds are normal. She exhibits no distension. There is no tenderness. There is no guarding.  Musculoskeletal: She exhibits no edema.  Neurological: She is alert and oriented to person, place, and time.  Skin: Skin is warm and dry. She is not diaphoretic.     Labs on Admission: I have personally reviewed following labs and imaging studies  CBC: No results for input(s): WBC, NEUTROABS, HGB, HCT, MCV, PLT in the last 168 hours. Basic Metabolic Panel: No results for input(s): NA, K, CL, CO2, GLUCOSE, BUN, CREATININE, CALCIUM, MG, PHOS in the last 168 hours. GFR: CrCl cannot be calculated (Patient's most recent lab result is older than the maximum 21 days allowed.). Liver Function Tests: No results for input(s): AST, ALT, ALKPHOS, BILITOT, PROT, ALBUMIN in the last 168 hours. No results for input(s): LIPASE, AMYLASE in the last 168 hours. No results for input(s): AMMONIA in the last 168 hours. Coagulation Profile: No results for input(s): INR, PROTIME in the last 168 hours. Cardiac Enzymes: No results for input(s): CKTOTAL, CKMB, CKMBINDEX, TROPONINI in the last 168 hours. BNP (last 3 results) No results for input(s): PROBNP in the last 8760 hours. HbA1C: No results for input(s): HGBA1C in the last 72 hours. CBG: Recent Labs  Lab 05/17/18 1200 05/19/18 0128  GLUCAP 156* 370*   Lipid Profile: No results for input(s): CHOL, HDL, LDLCALC, TRIG, CHOLHDL, LDLDIRECT in the last 72 hours. Thyroid Function Tests: No results for input(s): TSH, T4TOTAL, FREET4, T3FREE, THYROIDAB in the last 72 hours. Anemia Panel: No results for input(s): VITAMINB12, FOLATE, FERRITIN, TIBC, IRON, RETICCTPCT in the last 72 hours. Urine analysis:    Component Value Date/Time   COLORURINE STRAW (A) 11/07/2017 [place] 11/07/2017 1524   LABSPEC 1.006 11/07/2017 1524   PHURINE 7.0 11/07/2017 1524   GLUCOSEU NEGATIVE 11/07/2017 [place] 11/07/2017 [place] 11/07/2017 [place] 11/07/2017 1524   PROTEINUR 30 (A) 11/07/2017 1524   NITRITE NEGATIVE 11/07/2017 1524   LEUKOCYTESUR NEGATIVE 11/07/2017 1524    Radiological Exams on Admission: Nm Pet Image Initial (pi) Skull Base To Thigh  Result Date: 05/17/2018 CLINICAL DATA:  Initial treatment strategy for head  neck carcinoma. EXAM: NUCLEAR MEDICINE PET SKULL BASE TO THIGH TECHNIQUE: 8.6 mCi F-18 FDG was injected intravenously. Full-ring PET imaging was performed from the skull base to thigh after the radiotracer. CT data was obtained and used for attenuation correction and anatomic localization. Fasting blood glucose: 156 mg/dl COMPARISON:  Maxillary CT 02/10/2018 FINDINGS: Mediastinal blood pool activity: SUV max 2.3 NECK: Hypermetabolic activity localizing to the RIGHT hard palate with SUV max equal 5.1 (image 22 fused data) with well as coronal image 237 fused data set No hypermetabolic cervical lymph nodes. Incidental CT findings: none CHEST: Hypermetabolic LEFT axillary lymph node which is small SUV max equal 3.2. Probably misregistration with activity posterior to a rounded lymph node measuring 10 mm (image 61/4). No additional hypermetabolic lymph nodes. Second hypermetabolic nodule in the medial aspect of the LEFT breast with SUV max equal 3.8. This nodule measures 16 mm (image 86/4). Incidental CT findings: .Large heart. LEFT-sided pacemaker bilateral small effusions. ABDOMEN/PELVIS: No abnormal hypermetabolic activity within the liver, pancreas, adrenal glands, or spleen. No hypermetabolic lymph nodes in the abdomen or pelvis. Incidental CT findings: Several round lymph nodes LEFT of the aorta without significant metabolic activity. Example lymph node LEFT of the aortic bifurcation measuring 9 mm (image 151/4) SKELETON: There is intense metabolic activity along the RIGHT SI joint with SUV max equal 6.4. There is soft tissue  fullness within the inferior margin of the SI joints (image 169/4) which is hypermetabolic. This finding is similar to CT 08/16/2017. Additionally there is intense activity the facet joint posteriorly at L4-L5 with intense metabolic activity. There is soft tissue in the joint space similar to the RIGHT SI joint. This is also hypermetabolic SUV max equal 7.7 Incidental CT findings: none IMPRESSION: 1. Focal activity in the RIGHT hard palate corresponds with history of head neck cancer. 2. No evidence of metastatic adenopathy in the neck. 3. Hypermetabolic nodule in the medial LEFT breast and hypermetabolic LEFT axial lymph node. Recommend correlation with mammogram and potentially ultrasound. 4. No evidence of distant metastasis. 5. Focal metabolic activity along the RIGHT SI joint and the facet joints posteriorly at L4-L5. There is pannus in the joint suggesting a chronic inflammatory process. Malignancy is much less favored. Electronically Signed   By: Suzy Bouchard M.D.   On: 05/17/2018 14:56    Assessment/Plan Principal Problem:   SOB (shortness of breath) Active Problems:   Type 2 diabetes mellitus (HCC)   CAD (coronary artery disease)   Stage III chronic kidney disease (HCC)   Chest pain   Abnormal LFTs   Shortness of breath, chest pain -Differentials include acute heart failure exacerbation, COPD exacerbation, and possible PE.  Currently on 2 L supplemental oxygen (same as home requirement). -Work-up and treatment at outside hospital: Troponin 1.81 > 2.27, EKG with heart rate 89 and paced rhythm.  D-dimer 1.04, proBNP [id number]. ABG showing pH 7.49, PCO2 37, PO2 76. Chest x-ray showing stable cardiomegaly and pulmonary vascular congestion. She was given albuterol/Atrovent breathing treatments, 125 mg Solu-Medrol, 80 mg IV Lasix. -Patient was seen by cardiology.  Recommendation is to treat with heparin drip, IV Lasix, continue home carvedilol, perform VQ scan to rule out PE, and checking LDH  level.  Patient will require ischemic evaluation prior to discharge after improvement of her creatinine. -Will continue to trend troponin -IV heparin drip -IV Lasix 40 mg twice daily -VQ scan3 -LDH level -Monitor intake and output -Low-sodium diet and fluid restriction -DuoNeb's every 6 hours -Prednisone 40 mg daily -Supplemental  oxygen  CKD 3 -Stable. Creatinine 2.0.  Per care everywhere, creatinine was 1.9 on November 8. -Repeat BMP  -Continue to monitor renal function with IV diuresis  Abnormal LFTs LFTs checked at outside hospital: AST 67, T bili 2.3, ALT normal, alk phos normal.  Per care everywhere, LFTs were normal on November 8.  Patient denies having any abdominal pain and abdominal exam benign.  However does report having nausea. -Repeat hepatic function panel -Right upper quadrant ultrasound  CAD -Continue home aspirin, beta-blocker -Management of chest pain as above  Type 2 diabetes -Check A1c -Lantus 50 units twice daily (home dose) -SSI-M -CBG checks  Pharmacy medication reconciliation pending.  DVT prophylaxis: Heparin Code Status: Patient wishes to be DNR. Family Communication: Family available Disposition Plan: Anticipate discharge after clinical improvement Consults called: None Admission status: Observation  Shela Leff MD [facility] Pager [phone]  If 7PM-7AM, please contact night-coverage [email or web address] Password [facility]  05/19/2018, 4:50 AM

## 2018-05-20 DIAGNOSIS — I200 Unstable angina: Secondary | ICD-10-CM | POA: Diagnosis not present

## 2018-05-20 DIAGNOSIS — I5043 Acute on chronic combined systolic (congestive) and diastolic (congestive) heart failure: Secondary | ICD-10-CM

## 2018-05-20 DIAGNOSIS — R0602 Shortness of breath: Secondary | ICD-10-CM | POA: Diagnosis not present

## 2018-05-20 DIAGNOSIS — I214 Non-ST elevation (NSTEMI) myocardial infarction: Secondary | ICD-10-CM | POA: Diagnosis not present

## 2018-05-20 DIAGNOSIS — R945 Abnormal results of liver function studies: Secondary | ICD-10-CM | POA: Diagnosis not present

## 2018-05-20 DIAGNOSIS — N179 Acute kidney failure, unspecified: Secondary | ICD-10-CM

## 2018-05-20 DIAGNOSIS — N183 Chronic kidney disease, stage 3 (moderate): Secondary | ICD-10-CM | POA: Diagnosis not present

## 2018-05-20 DIAGNOSIS — J441 Chronic obstructive pulmonary disease with (acute) exacerbation: Secondary | ICD-10-CM

## 2018-05-20 LAB — CBC WITH DIFFERENTIAL/PLATELET
Abs Immature Granulocytes: 0.06 10*3/uL (ref 0.00–0.07)
BASOS ABS: 0 10*3/uL (ref 0.0–0.1)
Basophils Relative: 0 %
EOS PCT: 0 %
Eosinophils Absolute: 0 10*3/uL (ref 0.0–0.5)
HEMATOCRIT: 32.1 % — AB (ref 36.0–46.0)
HEMOGLOBIN: 9.7 g/dL — AB (ref 12.0–15.0)
Immature Granulocytes: 1 %
LYMPHS ABS: 0.5 10*3/uL — AB (ref 0.7–4.0)
LYMPHS PCT: 6 %
MCH: 26 pg (ref 26.0–34.0)
MCHC: 30.2 g/dL (ref 30.0–36.0)
MCV: 86.1 fL (ref 80.0–100.0)
MONOS PCT: 8 %
Monocytes Absolute: 0.6 10*3/uL (ref 0.1–1.0)
Neutro Abs: 6.4 10*3/uL (ref 1.7–7.7)
Neutrophils Relative %: 85 %
Platelets: 155 10*3/uL (ref 150–400)
RBC: 3.73 MIL/uL — ABNORMAL LOW (ref 3.87–5.11)
RDW: 16 % — ABNORMAL HIGH (ref 11.5–15.5)
WBC: 7.5 10*3/uL (ref 4.0–10.5)
nRBC: 0 % (ref 0.0–0.2)

## 2018-05-20 LAB — COMPREHENSIVE METABOLIC PANEL
ALBUMIN: 3.6 g/dL (ref 3.5–5.0)
ALK PHOS: 62 U/L (ref 38–126)
ALT: 19 U/L (ref 0–44)
AST: 35 U/L (ref 15–41)
Anion gap: 12 (ref 5–15)
BILIRUBIN TOTAL: 1 mg/dL (ref 0.3–1.2)
BUN: 86 mg/dL — AB (ref 8–23)
CALCIUM: 9.2 mg/dL (ref 8.9–10.3)
CO2: 26 mmol/L (ref 22–32)
CREATININE: 2.97 mg/dL — AB (ref 0.44–1.00)
Chloride: 94 mmol/L — ABNORMAL LOW (ref 98–111)
GFR calc Af Amer: 16 mL/min — ABNORMAL LOW (ref >60)
GFR, EST NON AFRICAN AMERICAN: 14 mL/min — AB (ref >60)
GLUCOSE: 301 mg/dL — AB (ref 70–99)
Potassium: 5.2 mmol/L — ABNORMAL HIGH (ref 3.5–5.1)
Sodium: 132 mmol/L — ABNORMAL LOW (ref 135–145)
Total Protein: 7 g/dL (ref 6.5–8.1)

## 2018-05-20 LAB — GLUCOSE, CAPILLARY
Glucose-Capillary: 206 mg/dL — ABNORMAL HIGH (ref 70–99)
Glucose-Capillary: 345 mg/dL — ABNORMAL HIGH (ref 70–99)
Glucose-Capillary: 329 mg/dL — ABNORMAL HIGH (ref 70–99)

## 2018-05-20 LAB — ECHOCARDIOGRAM COMPLETE
HEIGHTINCHES: 65 in
WEIGHTICAEL: 2569.6 [oz_av]

## 2018-05-20 LAB — MAGNESIUM: Magnesium: 2.5 mg/dL — ABNORMAL HIGH (ref 1.7–2.4)

## 2018-05-20 LAB — HEPARIN LEVEL (UNFRACTIONATED)
Heparin Unfractionated: 0.34 IU/mL (ref 0.30–0.70)
Heparin Unfractionated: 0.42 IU/mL (ref 0.30–0.70)

## 2018-05-20 MED ORDER — IPRATROPIUM-ALBUTEROL 0.5-2.5 (3) MG/3ML IN SOLN
3.0000 mL | Freq: Two times a day (BID) | RESPIRATORY_TRACT | Status: DC
  Administered 2018-05-20 – 2018-05-22 (×4): 3 mL via RESPIRATORY_TRACT
  Filled 2018-05-20 (×4): qty 3

## 2018-05-20 NOTE — Progress Notes (Signed)
DAILY PROGRESS NOTE   Patient Name: [name] Date of Encounter: 05/20/2018  Chief Complaint   No issues overnight  Patient Profile   [name] is a 84 y.o. female with a history of CHF, A. fib, CAD status post PCI and diabetes.  Presents with acute shortness of breath in the setting of heart failure exacerbation  Subjective   Chest pain free. I personally reviewed her echo today, there has been marked decline in LVEF to 20-25% with inferior akinesis (previously seen) and severe global hypokinesis. Troponin flat elevated around 5-5.5. May be consistent with demand ischemia in the setting of systolic CHF. Weight up overnight - very little net negative. Creatinine 2.97 (up from 2.83).  Objective   Vitals:   05/20/18 0332 05/20/18 0333 05/20/18 0903 05/20/18 0906  BP:  118/65    Pulse:  72    Resp:  17    Temp:  (!) 97.4 F (36.3 C)    TempSrc:  Oral    SpO2:  99% 99% 100%  Weight: 74.1 kg     Height:        Intake/Output Summary (Last 24 hours) at 05/20/2018 1211 Last data filed at 05/20/2018 1116 Gross per 24 hour  Intake 500 ml  Output 650 ml  Net -150 ml   Filed Weights   05/19/18 0307 05/20/18 0332  Weight: 72.8 kg 74.1 kg    Physical Exam   General appearance: alert and no distress Lungs: diminished breath sounds bilaterally and rales bibasilar Heart: regular rate and rhythm Extremities: edema 1+ edema Neurologic: Grossly normal  Inpatient Medications    Scheduled Meds: . aspirin  81 mg Oral Daily  . carvedilol  3.125 mg Oral BID WC  . furosemide  40 mg Intravenous Q12H  . insulin aspart  0-15 Units Subcutaneous TID WC  . insulin glargine  50 Units Subcutaneous BID  . ipratropium-albuterol  3 mL Nebulization BID  . mometasone-formoterol  2 puff Inhalation BID  . pantoprazole  40 mg Oral Daily  . predniSONE  40 mg Oral Q breakfast    Continuous Infusions: . heparin 1,200 Units/hr (05/20/18 0036)    PRN  Meds: acetaminophen **OR** acetaminophen, ALPRAZolam, alum & mag hydroxide-simeth, ipratropium-albuterol, morphine injection, oxyCODONE, polyvinyl alcohol   Labs   Results for orders placed or performed during the hospital encounter of 05/19/18 (from the past 48 hour(s))  Glucose, capillary     Status: Abnormal   Collection Time: 05/19/18  1:28 AM  Result Value Ref Range   Glucose-Capillary 370 (H) 70 - 99 mg/dL  Troponin I - Now Then Q6H     Status: Abnormal   Collection Time: 05/19/18  5:34 AM  Result Value Ref Range   Troponin I 5.68 (HH) <0.03 ng/mL    Comment: CRITICAL RESULT CALLED TO, READ BACK BY AND VERIFIED WITH: G COOPER,RN AT 6546 05/19/18 BY L BENFIELD Performed at [facility], [place] [address], [place], [place] [address]   CBC     Status: Abnormal   Collection Time: 05/19/18  5:34 AM  Result Value Ref Range   WBC 4.2 4.0 - 10.5 K/uL   RBC 3.80 (L) 3.87 - 5.11 MIL/uL   Hemoglobin 9.8 (L) 12.0 - 15.0 g/dL   HCT 33.3 (L) 36.0 - 46.0 %   MCV 87.6 80.0 - 100.0 fL   MCH 25.8 (L) 26.0 - 34.0 pg   MCHC 29.4 (L) 30.0 - 36.0 g/dL   RDW 16.2 (H) 11.5 -  15.5 %   Platelets 154 150 - 400 K/uL   nRBC 0.0 0.0 - 0.2 %    Comment: Performed at [facility], [place] [address], [place], [place] [address]  Basic metabolic panel     Status: Abnormal   Collection Time: 05/19/18  5:34 AM  Result Value Ref Range   Sodium 129 (L) 135 - 145 mmol/L   Potassium 6.0 (H) 3.5 - 5.1 mmol/L   Chloride 92 (L) 98 - 111 mmol/L   CO2 24 22 - 32 mmol/L   Glucose, Bld 441 (H) 70 - 99 mg/dL   BUN 65 (H) 8 - 23 mg/dL   Creatinine, Ser 2.83 (H) 0.44 - 1.00 mg/dL   Calcium 9.1 8.9 - 10.3 mg/dL   GFR calc non Af Amer 15 (L) >60 mL/min   GFR calc Af Amer 17 (L) >60 mL/min    Comment: (NOTE) The eGFR has been calculated using the CKD EPI equation. This calculation has not been validated in all clinical situations. eGFR's persistently <60 mL/min signify possible Chronic Kidney Disease.     Anion gap 13 5 - 15    Comment: Performed at [facility] [address] [place], [place] [address]  Hepatic function panel     Status: Abnormal   Collection Time: 05/19/18  5:34 AM  Result Value Ref Range   Total Protein 7.5 6.5 - 8.1 g/dL   Albumin 3.6 3.5 - 5.0 g/dL   AST 49 (H) 15 - 41 U/L   ALT 20 0 - 44 U/L   Alkaline Phosphatase 65 38 - 126 U/L   Total Bilirubin 2.0 (H) 0.3 - 1.2 mg/dL   Bilirubin, Direct 0.5 (H) 0.0 - 0.2 mg/dL   Indirect Bilirubin 1.5 (H) 0.3 - 0.9 mg/dL    Comment: Performed at [facility] [address], [place], [place] [address]  Hemoglobin A1c     Status: Abnormal   Collection Time: 05/19/18  5:34 AM  Result Value Ref Range   Hgb A1c MFr Bld 6.9 (H) 4.8 - 5.6 %    Comment: (NOTE) Pre diabetes:          5.7%-6.4% Diabetes:              >6.4% Glycemic control for   <7.0% adults with diabetes    Mean Plasma Glucose 151.33 mg/dL    Comment: Performed at [facility] [address], [place], [place] [address]  Lactate dehydrogenase     Status: Abnormal   Collection Time: 05/19/18  5:34 AM  Result Value Ref Range   LDH 294 (H) 98 - 192 U/L    Comment: Performed at [facility] [address] [place], [place] [address]  Protime-INR     Status: Abnormal   Collection Time: 05/19/18  5:34 AM  Result Value Ref Range   Prothrombin Time 15.9 (H) 11.4 - 15.2 seconds   INR 1.28     Comment: Performed at [facility] [address] [place], [place] [address]  Glucose, capillary     Status: Abnormal   Collection Time: 05/19/18  7:28 AM  Result Value Ref Range   Glucose-Capillary 426 (H) 70 - 99 mg/dL  Troponin I - Now Then Q6H     Status: Abnormal   Collection Time: 05/19/18 10:38 AM  Result Value Ref Range   Troponin I 5.05 (HH) <0.03 ng/mL    Comment: CRITICAL VALUE NOTED.  VALUE IS  CONSISTENT WITH PREVIOUSLY REPORTED AND CALLED VALUE. Performed at [facility], [place] [address] [place], [place] [address]    Glucose, capillary     Status: Abnormal   Collection Time: 05/19/18 11:22 AM  Result Value Ref Range   Glucose-Capillary 411 (H) 70 - 99 mg/dL  Heparin level (unfractionated)     Status: Abnormal   Collection Time: 05/19/18  1:45 PM  Result Value Ref Range   Heparin Unfractionated 0.18 (L) 0.30 - 0.70 IU/mL    Comment: (NOTE) If heparin results are below expected values, and patient dosage has  been confirmed, suggest follow up testing of antithrombin III levels. Performed at [facility], [place] [address], [place], [place] [address]   Troponin I - Now Then Q6H     Status: Abnormal   Collection Time: 05/19/18  4:33 PM  Result Value Ref Range   Troponin I 5.34 (HH) <0.03 ng/mL    Comment: CRITICAL VALUE NOTED.  VALUE IS CONSISTENT WITH PREVIOUSLY REPORTED AND CALLED VALUE. Performed at [facility], [place] [address], [place], [place] [address]   Glucose, capillary     Status: Abnormal   Collection Time: 05/19/18  4:41 PM  Result Value Ref Range   Glucose-Capillary 260 (H) 70 - 99 mg/dL  Glucose, capillary     Status: Abnormal   Collection Time: 05/19/18  8:59 PM  Result Value Ref Range   Glucose-Capillary 256 (H) 70 - 99 mg/dL  Heparin level (unfractionated)     Status: Abnormal   Collection Time: 05/19/18 11:03 PM  Result Value Ref Range   Heparin Unfractionated 0.23 (L) 0.30 - 0.70 IU/mL    Comment: (NOTE) If heparin results are below expected values, and patient dosage has  been confirmed, suggest follow up testing of antithrombin III levels. Performed at [facility], [place] [address], [place], [place] [address]   CBC with Differential/Platelet     Status: Abnormal   Collection Time: 05/20/18  3:22 AM  Result Value Ref Range   WBC 7.5 4.0 - 10.5 K/uL   RBC 3.73 (L) 3.87 - 5.11 MIL/uL   Hemoglobin 9.7 (L) 12.0 - 15.0 g/dL   HCT 32.1 (L) 36.0 - 46.0 %   MCV 86.1 80.0 - 100.0 fL   MCH 26.0 26.0 - 34.0 pg   MCHC 30.2 30.0 - 36.0 g/dL   RDW 16.0 (H) 11.5 -  15.5 %   Platelets 155 150 - 400 K/uL   nRBC 0.0 0.0 - 0.2 %   Neutrophils Relative % 85 %   Neutro Abs 6.4 1.7 - 7.7 K/uL   Lymphocytes Relative 6 %   Lymphs Abs 0.5 (L) 0.7 - 4.0 K/uL   Monocytes Relative 8 %   Monocytes Absolute 0.6 0.1 - 1.0 K/uL   Eosinophils Relative 0 %   Eosinophils Absolute 0.0 0.0 - 0.5 K/uL   Basophils Relative 0 %   Basophils Absolute 0.0 0.0 - 0.1 K/uL   Immature Granulocytes 1 %   Abs Immature Granulocytes 0.06 0.00 - 0.07 K/uL    Comment: Performed at [facility], [place] [address], [place], [place] [address]  Comprehensive metabolic panel     Status: Abnormal   Collection Time: 05/20/18  3:22 AM  Result Value Ref Range   Sodium 132 (L) 135 - 145 mmol/L   Potassium 5.2 (H) 3.5 - 5.1 mmol/L   Chloride 94 (L) 98 - 111 mmol/L   CO2 26 22 -  32 mmol/L   Glucose, Bld 301 (H) 70 - 99 mg/dL   BUN 86 (H) 8 - 23 mg/dL   Creatinine, Ser 2.97 (H) 0.44 - 1.00 mg/dL   Calcium 9.2 8.9 - 10.3 mg/dL   Total Protein 7.0 6.5 - 8.1 g/dL   Albumin 3.6 3.5 - 5.0 g/dL   AST 35 15 - 41 U/L   ALT 19 0 - 44 U/L   Alkaline Phosphatase 62 38 - 126 U/L   Total Bilirubin 1.0 0.3 - 1.2 mg/dL   GFR calc non Af Amer 14 (L) >60 mL/min   GFR calc Af Amer 16 (L) >60 mL/min    Comment: (NOTE) The eGFR has been calculated using the CKD EPI equation. This calculation has not been validated in all clinical situations. eGFR's persistently <60 mL/min signify possible Chronic Kidney Disease.    Anion gap 12 5 - 15    Comment: Performed at [facility] [address], [place], [place] [address]  Magnesium     Status: Abnormal   Collection Time: 05/20/18  3:22 AM  Result Value Ref Range   Magnesium 2.5 (H) 1.7 - 2.4 mg/dL    Comment: Performed at [facility] [address], [place], [place] [address]  Glucose, capillary     Status: Abnormal   Collection Time: 05/20/18  7:24 AM  Result Value Ref Range   Glucose-Capillary 206 (H) 70 - 99 mg/dL  Heparin level  (unfractionated)     Status: None   Collection Time: 05/20/18  8:31 AM  Result Value Ref Range   Heparin Unfractionated 0.34 0.30 - 0.70 IU/mL    Comment: (NOTE) If heparin results are below expected values, and patient dosage has  been confirmed, suggest follow up testing of antithrombin III levels. Performed at [facility], [place] [address], [place], [place] [address]   Glucose, capillary     Status: Abnormal   Collection Time: 05/20/18 11:17 AM  Result Value Ref Range   Glucose-Capillary 345 (H) 70 - 99 mg/dL    ECG   Pending  Telemetry   Paced rhythm - Personally Reviewed  Radiology    Dg Chest 2 View  Result Date: 05/19/2018 CLINICAL DATA:  Shortness of breath for three days, former smoker, COPD, CHF, hypertension EXAM: CHEST - 2 VIEW COMPARISON:  05/18/2018 chest radiograph, PET-CT 05/17/2018 FINDINGS: LEFT subclavian pacemaker/AICD leads unchanged. Epicardial pacing leads unchanged. Enlargement of cardiac silhouette with pulmonary vascular congestion. Mediastinal contours normal. Hazy opacity RIGHT upper lobe question pneumonia, new since PET-CT. No pleural effusion or pneumothorax. Bones demineralized. IMPRESSION: Enlargement of cardiac silhouette and pulmonary vascular congestion post pacemaker/AICD. Question RIGHT upper lobe pneumonia. Electronically Signed   By: Lavonia Dana M.D.   On: 05/19/2018 10:30   Nm Pulmonary Perf And Vent  Result Date: 05/19/2018 CLINICAL DATA:  Shortness of breath EXAM: NUCLEAR MEDICINE VENTILATION - PERFUSION LUNG SCAN TECHNIQUE: Ventilation images were obtained in multiple projections using inhaled aerosol Tc-60mDTPA. Perfusion images were obtained in multiple projections after intravenous injection of Tc-921mAA. RADIOPHARMACEUTICALS:  31.6 mCi of Tc-9966mPA aerosol inhalation and 4.30 mCi Tc99m51m IV COMPARISON:  None Correlation: Chest radiograph 05/19/2018 FINDINGS: Ventilation: Enlargement of cardiac silhouette. Photopenic defect  from pacemaker. Mild patchy aerosol distribution. Diminished ventilation LEFT lower lobe. Perfusion: Enlargement of cardiac silhouette. Photopenic defect from pacemaker. No segmental or subsegmental perfusion defects identified. Chest radiograph: Enlargement of cardiac silhouette and pulmonary vascular congestion post pacemaker/AICD. IMPRESSION: Very low  probability for pulmonary embolism. Electronically Signed   By: Lavonia Dana M.D.   On: 05/19/2018 11:07   US Abdomen Limited Ruq  Result Date: 05/19/2018 CLINICAL DATA:  84 year old female with elevated LFTs and cirrhosis. History of cholecystectomy. EXAM: ULTRASOUND ABDOMEN LIMITED RIGHT UPPER QUADRANT COMPARISON:  05/17/2018 PET CT, 09/29/2017 ultrasound and prior studies FINDINGS: Gallbladder: Not visualized compatible with cholecystectomy. Common bile duct: Diameter: 7 mm. No evidence of intrahepatic or extrahepatic biliary dilatation. Liver: Slightly nodular and heterogeneous liver noted without focal mass or abnormality. Portal vein is patent on color Doppler imaging with normal direction of blood flow towards the liver. IMPRESSION: 1. Slightly nodular and heterogeneous liver which would be compatible with this patient's history of cirrhosis. No focal hepatic lesions. Normal hepatopetal portal vein flow. 2. No biliary dilatation. Electronically Signed   By: Margarette Canada M.D.   On: 05/19/2018 10:24    Cardiac Studies   LV EF: 20% -   25%  ------------------------------------------------------------------- Indications:      Chest pain 786.51.  ------------------------------------------------------------------- Study Conclusions  - Procedure narrative: Transthoracic echocardiography. Image   quality was suboptimal. The study was technically difficult, as a   result of poor acoustic windows, poor sound wave transmission,   and body habitus. Intravenous contrast (Definity) was   administered. - Left ventricle: The cavity size was moderately  dilated. Wall   thickness was normal. Systolic function was severely reduced. The   estimated ejection fraction was in the range of 20% to 25%.   Doppler parameters are consistent with pseudonormal left   ventricular relaxation (grade 2 diastolic dysfunction). The E/e&'   ratio is >15, suggesting elevated LV filling pressure. - Aortic valve: Valve area (VTI): 2.35 cm^2. Valve area (Vmean):   2.39 cm^2. - Mitral valve: Calcified annulus. Mildly thickened leaflets .   There was mild regurgitation. - Left atrium: Severely dilated. - Right ventricle: The cavity size was mildly dilated. Mild   systolic dysfunction. Pacer wire or catheter noted in right   ventricle. - Right atrium: Moderately dilated. Pacer wire or catheter noted in   right atrium. - Tricuspid valve: There was mild regurgitation. - Pulmonary arteries: PA peak pressure: 24 mm Hg (S) + RAP. - Systemic veins: Not visualized.  Impressions:  - Technically difficult study. Definity contrast given. Compared to   a prior study in 2018, the LVEF has further declined to 20-25%   with inferior akinesis and severe global hypokinesis.  Assessment   Principal Problem:   SOB (shortness of breath) Active Problems:   Shortness of breath   Type 2 diabetes mellitus (HCC)   CAD (coronary artery disease)   Non-ST elevation (NSTEMI) myocardial infarction (HCC)   Stage III chronic kidney disease (HCC)   Chest pain   Abnormal LFTs   Plan   1. Echo personally reviewed today - marked decline in LVEF. She tells me her AICD has been deactivated. Now AKI on CKD3-4. Not making much urine with diuresis - in florid CHF. Will need nephrology input as to whether she is a dialysis candidate. Not able to take to cath at this point as she would likely progress to dialysis. If she is not considered a dialysis candidate, then agree to pursue palliative care in light of her multiple co-morbidities. For now, continue heparin and diuresis.  Time Spent  Directly with Patient:  I have spent a total of 25 minutes with the patient reviewing hospital notes, telemetry, EKGs, labs and examining the patient as well as establishing  an assessment and plan that was discussed personally with the patient.  > 50% of time was spent in direct patient care.  Length of Stay:  LOS: 1 day   Pixie Casino, MD, [facility], [place] Director of the Advanced Lipid Disorders &  Cardiovascular Risk Reduction Clinic Diplomate of the American Board of Clinical Lipidology Attending Cardiologist  Direct Dial: [phone]  Fax: [phone]  Website:  [email or web address] 05/20/2018, 12:11 PM

## 2018-05-20 NOTE — Care Management Obs Status (Signed)
[facility] NOTIFICATION   Patient Details  Name: [name] MRN: [id number] Date of Birth: 06-11-1934   Medicare Observation Status Notification Given:  Yes    Claudie Leach, RN 05/20/2018, 4:13 PM

## 2018-05-20 NOTE — Progress Notes (Signed)
ANTICOAGULATION CONSULT NOTE   Pharmacy Consult for Heparin  Indication: chest pain/ACS and and rule out PE  Allergies  Allergen Reactions  . Augmentin [Amoxicillin-Pot Clavulanate]     Abdominal pain  . Metformin     Upset stomach  . Metolazone     "made everything hurt" 07/03/15  . Sulfonamide Derivatives     swelling   Patient Measurements: Height: 5\' 5"  (165.1 cm) Weight: 163 lb 5.8 oz (74.1 kg) IBW/kg (Calculated) : 57  Vital Signs: Temp: 97.6 F (36.4 C) (11/24 1341) Temp Source: Oral (11/24 1341) BP: 123/68 (11/24 1341) Pulse Rate: 71 (11/24 1445)  Labs: Recent Labs    05/19/18 0534 05/19/18 1038  05/19/18 1633 05/19/18 2303 05/20/18 0322 05/20/18 0831 05/20/18 1819  HGB 9.8*  --   --   --   --  9.7*  --   --   HCT 33.3*  --   --   --   --  32.1*  --   --   PLT 154  --   --   --   --  155  --   --   LABPROT 15.9*  --   --   --   --   --   --   --   INR 1.28  --   --   --   --   --   --   --   HEPARINUNFRC  --   --    < >  --  0.23*  --  0.34 0.42  CREATININE 2.83*  --   --   --   --  2.97*  --   --   TROPONINI 5.68* 5.05*  --  5.34*  --   --   --   --    < > = values in this interval not displayed.    Estimated Creatinine Clearance: 14.7 mL/min (A) (by C-G formula based on SCr of 2.97 mg/dL (H)).   Medical History: Past Medical History:  Diagnosis Date  . Aneurysm ([place])    Apical; previously on Coumadin  . Anxiety disorder   . Cancer (HCC)    Skin  . Chronic renal insufficiency    Creatinine 1.17 January 2010  . Coronary artery disease    Cath 02/15/12 revealed RCA as culprit lesion w/ occlusion at site of prior stent, patent LAD stent, chronic LCx and Diagonal dz; Medical therapy   . Depression   . GERD (gastroesophageal reflux disease)   . Hyperlipidemia    H/o GI intolerance to Lipitor  . Impingement syndrome of left shoulder   . Ischemic cardiomyopathy    s/p Medtronic BiV ICD 2006  . Peripheral polyneuropathy    Refractory to several  medications  . Systolic and diastolic CHF, chronic (HCC)    echo 03/2012 EF 17-79%, mod diastolic dysfunction, mod MR, mod LAE, mod-severe TR, PASP 66mmHg  . Type 2 diabetes mellitus [facility])     [name] transfer from [facility] with chest pain and shortness of breath. Pharmacy consulted for heparin dosing. VQ scan showing very low probability for PE.  Heparin level came back therapeutic at 0.42, on 1200 units/hr. CBC stable. No s/sx of bleeding. No infusion issues.   Goal of Therapy:  Heparin level 0.3-0.7 units/ml Monitor platelets by anticoagulation protocol: Yes   Plan:  Continue heparin drip to 1200 units/hr Monitor daily heparin level, CBC, s/sx of bleeding  Antonietta Jewel, PharmD Clinical Pharmacist  Pager: [phone] Phone: [phone] 05/20/2018  7:10 PM

## 2018-05-20 NOTE — Care Management Note (Signed)
Case Management Note  Patient Details  Name: CHAMIKA CUNANAN MRN: [id number] Date of Birth: 1933/09/10  Subjective/Objective:           Pt from home alone for  COPD exacerbation/ CHF. Pt states she lives alone with family/friends to support her as needed.  She has DME O2 through [place] and is active with [facility] for PT only.  Pt would like to have a RN added to her [facility] services.  Pt plans to return home ASAP.  Pt uses rollator, transport WC, raised toilet and shower seat.  Pt does not feel any other DME is necessary.  Pt states her insurance company nurse monitors her daily weights and calls her to monitor symptoms.          Action/Plan: Pt will need RN added to [facility] order.  CM will continue to monitor for other DC needs.   Expected Discharge Date:                  Expected Discharge Plan:  [place]  In-House Referral:  NA  Discharge planning Services  CM Consult  Post Acute Care Choice:  Home Health Choice offered to:     DME Arranged:    DME Agency:     HH Arranged:    HH Agency:     Status of Service:  In process, will continue to follow  If discussed at Long Length of Stay Meetings, dates discussed:    Additional Comments:  Claudie Leach, RN 05/20/2018, 4:27 PM

## 2018-05-20 NOTE — Consult Note (Signed)
Renal Service Consult Note [facility]  [name] 05/20/2018 Sol Blazing Requesting Physician:  Dr Starla Link  Reason for Consult:  CKD 4 w/ chest pain HPI: The patient is a 84 y.o. year-old w/ hx of COPD on 2L O2, combined CHF, ICM, sp AICD, DM2, HL, CKD III sent from [facility] for SOB and chest pain.  CP resolved in the ED.  Has COPD and is on O2 at home. Creat on admission was 2.8 yest and 2.9 today.  Baseline creat is 1.2-  2.0.  Asked to see for renal failure.    Patient shares how she recently dx'd w/ H&N cancer, has worsening heart failure and a nonhealing wound on her R foot under one of the toes, how she can't get to all her appts w/ her doctors because there are too many.    Denies any SOB, cough, CP , abd pain, no n/v/d, no loss of appetite.  She was referred to a kidney doctor in [place] but couldn't make the appt due to this admission.     EChart:  05/2015 > Cleora Fleet, hx LAD/ RCA stents, hx ICM EF 40%, BiV defib implant, gen'd weakness multifact, CKD 3/4, low plts and MD, chronic pain  11/2017 > SOB / decomp CHF in pt w/ COPD on home O2, rx'd IV lasix. CPS on dilaudid.      Date   Creat  eGFR  2008- 16   1.1- 1.9 25- 45   May 2019  1.88  18 Dec 2017  1.1- 1.2 41- 46  Nov 23  2.83   May 20 2018  2.97  ROS  denies CP  no joint pain   no HA  no blurry vision  no rash  no diarrhea  no nausea/ vomiting  no dysuria  no difficulty voiding  no change in urine color    Past Medical History  Past Medical History:  Diagnosis Date  . Aneurysm ([place])    Apical; previously on Coumadin  . Anxiety disorder   . Cancer (HCC)    Skin  . Chronic renal insufficiency    Creatinine 1.17 January 2010  . Coronary artery disease    Cath 02/15/12 revealed RCA as culprit lesion w/ occlusion at site of prior stent, patent LAD stent, chronic LCx and Diagonal dz; Medical therapy   . Depression   . GERD (gastroesophageal reflux disease)   . Hyperlipidemia     H/o GI intolerance to Lipitor  . Impingement syndrome of left shoulder   . Ischemic cardiomyopathy    s/p Medtronic BiV ICD 2006  . Peripheral polyneuropathy    Refractory to several medications  . Systolic and diastolic CHF, chronic (HCC)    echo 03/2012 EF 73-53%, mod diastolic dysfunction, mod MR, mod LAE, mod-severe TR, PASP 34mHg  . Type 2 diabetes mellitus ([facility]    Past Surgical History  Past Surgical History:  Procedure Laterality Date  . ABDOMINAL HYSTERECTOMY    . CARDIAC DEFIBRILLATOR PLACEMENT  05/28/2009   Medtronic ICD placed by Dr. KCaryl Comessecondary to ICM/CHF, [phone] lead fracture with ICD storm 2010 requiring new RV lead placement  . CHOLECYSTECTOMY    . EYE SURGERY    . IMPLANTABLE CARDIOVERTER DEFIBRILLATOR GENERATOR CHANGE  09/26/13   BiV ICD generator change by Dr ARayann Heman- MDT VPershing ProudXT CRT-D  . IMPLANTABLE CARDIOVERTER DEFIBRILLATOR GENERATOR CHANGE N/A 09/26/2013   Procedure: IMPLANTABLE CARDIOVERTER DEFIBRILLATOR GENERATOR CHANGE;  Surgeon: JCoralyn Mark MD;  Location:  [place] CATH LAB;  Service: Cardiovascular;  Laterality: N/A;  . LEFT HEART CATHETERIZATION WITH CORONARY ANGIOGRAM N/A 02/15/2012   Procedure: LEFT HEART CATHETERIZATION WITH CORONARY ANGIOGRAM;  Surgeon: Peter M Martinique, MD;  Location: [facility] CATH LAB;  Service: Cardiovascular;  Laterality: N/A;  . RIGHT HEART CATHETERIZATION N/A 04/27/2012   Procedure: RIGHT HEART CATH;  Surgeon: Larey Dresser, MD;  Location: [facility] CATH LAB;  Service: Cardiovascular;  Laterality: N/A;   Family History  Family History  Problem Relation Age of Onset  . Heart attack Father   . Heart disease Sister   . Heart disease Brother   . Coronary artery disease Neg Hx    Social History  reports that she quit smoking about 15 years ago. Her smoking use included cigarettes. She started smoking about 61 years ago. She has a 24.00 pack-year smoking history. She has never used smokeless tobacco. She reports that she does not drink alcohol  or use drugs. Allergies  Allergies  Allergen Reactions  . Augmentin [Amoxicillin-Pot Clavulanate]     Abdominal pain  . Metformin     Upset stomach  . Metolazone     "made everything hurt" 07/03/15  . Sulfonamide Derivatives     swelling   Home medications Prior to Admission medications   Medication Sig Start Date End Date Taking? Authorizing Provider  albuterol (ACCUNEB) 0.63 MG/3ML nebulizer solution Take 1 ampule by nebulization every 6 (six) hours as needed for wheezing.   Yes [provider]  allopurinol (ZYLOPRIM) 100 MG tablet Take 200 mg by mouth daily.    Yes [provider]  ALPRAZolam (XANAX) 0.25 MG tablet Take 0.25 mg by mouth at bedtime as needed for sleep.   Yes [provider]  carvedilol (COREG) 3.125 MG tablet Take 1 tablet (3.125 mg total) by mouth 2 (two) times daily with a meal. 09/15/17  Yes Herminio Commons, MD  citalopram (CELEXA) 20 MG tablet Take 20 mg by mouth daily.    Yes [provider]  diclofenac sodium (VOLTAREN) 1 % GEL Apply 2 g topically 4 (four) times daily. 05/03/18 05/03/19 Yes [provider]  HYDROmorphone (DILAUDID) 4 MG tablet Take 4 mg by mouth 2 (two) times daily as needed for severe pain.   Yes [provider]  nitroGLYCERIN (NITROSTAT) 0.4 MG SL tablet Place 1 tablet (0.4 mg total) under the tongue every 5 (five) minutes as needed for chest pain. 06/14/13  Yes Minus Breeding, MD  OXYGEN Inhale 2 L into the lungs continuous.   Yes [provider]  potassium chloride SA (K-DUR,KLOR-CON) 20 MEQ tablet Take 1 tablet (20 mEq total) by mouth daily. 03/14/18  Yes Herminio Commons, MD  ranitidine (ZANTAC) 150 MG tablet Take 150 mg by mouth daily.    Yes [provider]  torsemide (DEMADEX) 20 MG tablet Take 2 tablets (40 mg total) by mouth every morning. & 20 mg in the evening 03/20/18  Yes Herminio Commons, MD  Ascorbic Acid (VITAMIN C) 500 MG CAPS Take 100 mg by mouth  daily.     [provider]  aspirin 81 MG chewable tablet Chew 1 tablet (81 mg total) by mouth daily. 06/06/15   Rexene Alberts, MD  cyanocobalamin 2000 MCG tablet Take 2,000 mcg by mouth daily.    [provider]  ergocalciferol (VITAMIN D2) 50000 units capsule Take 50,000 Units by mouth 2 (two) times a week.    [provider]  insulin glargine (LANTUS) 100 unit/mL SOPN  Inject 50 Units into the skin 2 (two) times daily. 50 units in the morning and 50 units in the evening    [provider]  metolazone (ZAROXOLYN) 2.5 MG tablet Take 1 tablet (2.5 mg total) by mouth as needed (5 days per week per Dr. Bronson Ing). Patient not taking: Reported on 05/19/2018 03/14/18   Herminio Commons, MD   Liver Function Tests Recent Labs  Lab 05/19/18 0534 05/20/18 0322  AST 49* 35  ALT 20 19  ALKPHOS 65 62  BILITOT 2.0* 1.0  PROT 7.5 7.0  ALBUMIN 3.6 3.6   No results for input(s): LIPASE, AMYLASE in the last 168 hours. CBC Recent Labs  Lab 05/19/18 0534 05/20/18 0322  WBC 4.2 7.5  NEUTROABS  --  6.4  HGB 9.8* 9.7*  HCT 33.3* 32.1*  MCV 87.6 86.1  PLT 154 664   Basic Metabolic Panel Recent Labs  Lab 05/19/18 0534 05/20/18 0322  NA 129* 132*  K 6.0* 5.2*  CL 92* 94*  CO2 24 26  GLUCOSE 441* 301*  BUN 65* 86*  CREATININE 2.83* 2.97*  CALCIUM 9.1 9.2   Iron/TIBC/Ferritin/ %Sat    Component Value Date/Time   IRON 34 (L) 11/21/2017 0941   TIBC 322 11/21/2017 0941   FERRITIN 126 11/21/2017 0941   IRONPCTSAT 11 11/21/2017 0941    Vitals:   05/20/18 0332 05/20/18 0333 05/20/18 0903 05/20/18 0906  BP:  118/65    Pulse:  72    Resp:  17    Temp:  (!) 97.4 F (36.3 C)    TempSrc:  Oral    SpO2:  99% 99% 100%  Weight: 74.1 kg     Height:       Exam Gen alert, frail , obese WF no distress, calm No rash, cyanosis or gangrene Sclera anicteric, throat clear  No jvd or bruits Chest clear bilat to bases, no rales or wheezing RRR no  MRG Abd soft ntnd no mass or ascites +bs  GU defer MS no joint effusions or deformity Ext no LE or UE edema, no wounds or ulcers Neuro is alert, Ox 3 , nf    Home meds:  - carvedilol 3.125 bid/ KDur 20 / torsemide 40 am+ 20 pm/ metolazone 2.5 prn  - insulin glargine 50 bid  - allopurinol 200 / ranitidine 150/ ecasa 81  - home O2 2L  - hydromorphone 4 bid prn/ citalopram 20 qd/ diclofenac sod 1% gel prn/ alb nebs prn    04/2018 ECHO > EF 20-25%, G2DD, ^E/e& ratio c/w ^LV filling pressures  UA - none here, last UA 10/2017 > 30 prot, no rbc/ wbc  US abdomen 09/2017 > nodular liver contours concerning for cirrhosis; normal flow direction in the portal vein. 10-11 cm kidneys no hydro.   CXR 05/19/18 > no edema or CHF, +PPM, large heart shadow, no effusions   Impression/ Plan 1. AKI - not sure etiolog, cardiorenal possibly. Not grossly overloaded. Check UA , renal US, urine lytes.  New low ED 20% but not signs of sig CHF on exam or CXR.  We had a long conversation about dialysis if needed w/ jher friend present.  I don't think she would do well given her comorbidities. We discussed this and patient also feels it would be too much. Would recommend conservative, medical care of CKD/ AKI.  She would like to f/u w/ the kidney doctor in [place] after dc.   2. NSTEMI/ +trop - per primary/ cardiology 3. A/C  combined CHF - EF worse than before 4. COPD w/ mild exacerbation 5. DM2 uncont 6. CKD III - baseline creat 1.2- 2.0 7. CAD hx stents 8. Deconditioning 9. DNR  Kelly Splinter MD [facility] pager [phone]   05/20/2018, 1:28 PM

## 2018-05-20 NOTE — Progress Notes (Signed)
ANTICOAGULATION CONSULT NOTE   Pharmacy Consult for Heparin  Indication: chest pain/ACS and and rule out PE  Allergies  Allergen Reactions  . Augmentin [Amoxicillin-Pot Clavulanate]     Abdominal pain  . Metformin     Upset stomach  . Metolazone     "made everything hurt" 07/03/15  . Sulfonamide Derivatives     swelling   Patient Measurements: Height: 5\' 5"  (165.1 cm) Weight: 163 lb 5.8 oz (74.1 kg) IBW/kg (Calculated) : 57  Vital Signs: Temp: 97.4 F (36.3 C) (11/24 0333) Temp Source: Oral (11/24 0333) BP: 118/65 (11/24 0333) Pulse Rate: 72 (11/24 0333)  Labs: Recent Labs    05/19/18 0534 05/19/18 1038 05/19/18 1345 05/19/18 1633 05/19/18 2303 05/20/18 0322 05/20/18 0831  HGB 9.8*  --   --   --   --  9.7*  --   HCT 33.3*  --   --   --   --  32.1*  --   PLT 154  --   --   --   --  155  --   LABPROT 15.9*  --   --   --   --   --   --   INR 1.28  --   --   --   --   --   --   HEPARINUNFRC  --   --  0.18*  --  0.23*  --  0.34  CREATININE 2.83*  --   --   --   --  2.97*  --   TROPONINI 5.68* 5.05*  --  5.34*  --   --   --     Estimated Creatinine Clearance: 14.7 mL/min (A) (by C-G formula based on SCr of 2.97 mg/dL (H)).   Medical History: Past Medical History:  Diagnosis Date  . Aneurysm ([place])    Apical; previously on Coumadin  . Anxiety disorder   . Cancer (HCC)    Skin  . Chronic renal insufficiency    Creatinine 1.17 January 2010  . Coronary artery disease    Cath 02/15/12 revealed RCA as culprit lesion w/ occlusion at site of prior stent, patent LAD stent, chronic LCx and Diagonal dz; Medical therapy   . Depression   . GERD (gastroesophageal reflux disease)   . Hyperlipidemia    H/o GI intolerance to Lipitor  . Impingement syndrome of left shoulder   . Ischemic cardiomyopathy    s/p Medtronic BiV ICD 2006  . Peripheral polyneuropathy    Refractory to several medications  . Systolic and diastolic CHF, chronic (HCC)    echo 03/2012 EF 70-26%, mod  diastolic dysfunction, mod MR, mod LAE, mod-severe TR, PASP 75mmHg  . Type 2 diabetes mellitus [facility])     [name] transfer from [facility] with chest pain and shortness of breath. Pharmacy consulted for heparin dosing. Getting VQ to rule out PE. Hgb 9.7, Plts 155  Heparin level therapeutic at 0.34, no s/s of bleeding per nurse  Goal of Therapy:  Heparin level 0.3-0.7 units/ml Monitor platelets by anticoagulation protocol: Yes   Plan:  Continue heparin drip to 1200 units/hr Re-check heparin level in 8 hours  Gwenlyn Found, [place] D PGY1 Pharmacy Resident  Phone [phone] 05/20/2018   9:42 AM

## 2018-05-20 NOTE — Progress Notes (Signed)
Patient ID: [name], female   DOB: 06/06/1934, 84 y.o.   MRN: [id number]  PROGRESS NOTE    [name]  KPV:[id number] DOB: [birth year] DOA: 05/19/2018 PCP: Loman Brooklyn, FNP   Brief Narrative:  84 year old female with history of COPD on 2 L oxygen, chronic combined systolic and diastolic congestive heart failure, ischemic cardia myopathy status post AICD, CAD, diabetes mellitus type 2, hyperlipidemia, chronic kidney disease was transferred from [facility] for evaluation of shortness of breath and chest pain.  She was found to have elevated troponin and acute kidney injury.  Chest x-ray showed pulmonary vascular congestion.  She was started on intravenous Lasix.  Cardiology was consulted.   Assessment & Plan:   Principal Problem:   SOB (shortness of breath) Active Problems:   Shortness of breath   Type 2 diabetes mellitus (HCC)   CAD (coronary artery disease)   Non-ST elevation (NSTEMI) myocardial infarction (HCC)   Stage III chronic kidney disease (HCC)   Chest pain   Abnormal LFTs   Non-STEMI -Cardiology following.  Currently on heparin drip.  VQ scan was very low probability for PE -Continue aspirin, Coreg.  Echo report is pending -Currently chest pain-free.  Acute on chronic combined systolic and diastolic heart failure -Strict input and output.  Daily weights.  Continue IV Lasix.  Cardiology following.  Continue Coreg.  COPD with probable mild exacerbation along with chronic hypoxic respiratory failure -Continue Dulera along with duo nebs.  Continue oral prednisone for total of 5 days.  Respiratory status stable  Acute kidney injury on chronic kidney disease stage III -Creatinine is 2.97 today.  Monitor.  If creatinine worsens, might have to get nephrology evaluation.  Renal ultrasound.  Diabetes mellitus type II uncontrolled with hyperglycemia with peripheral neuropathy -Continue Lantus twice a day along with Accu-Cheks with sliding scale  coverage.  We will also add meal coverage with NovoLog. -Patient complains of bilateral feet pain and is requesting for Dilaudid that she uses as an outpatient.  Explained to her that her respiratory status might worsen with Dilaudid.  Will use oxycodone and as needed morphine for now.  Abnormal LFTs -Resolved.  Bili total has normalized.  Right upper quadrant ultrasound showed nodular and heterogeneous liver which could be compatible with the patient's history of cirrhosis.  No focal hepatic lesions and no biliary dilatation.  Outpatient follow-up  Coronary artery disease -Continue aspirin, beta-blocker.  Not on statin.  No current chest pain.  Cardiology following  Generalized deconditioning -Overall prognosis is guarded considering her worsening renal function.  Palliative care evaluation pending.  DVT prophylaxis: Heparin Code Status: DNR Family Communication: None at bedside Disposition Plan: Depends on clinical outcome  Consultants: Cardiology/palliative care  Procedures: Echo pending  Antimicrobials: None   Subjective: Patient seen and examined at bedside.  She complains that her bilateral feet hurt and is requesting for Dilaudid.  No overnight fever, nausea or vomiting.  Objective: Vitals:   05/20/18 0332 05/20/18 0333 05/20/18 0903 05/20/18 0906  BP:  118/65    Pulse:  72    Resp:  17    Temp:  (!) 97.4 F (36.3 C)    TempSrc:  Oral    SpO2:  99% 99% 100%  Weight: 74.1 kg     Height:        Intake/Output Summary (Last 24 hours) at 05/20/2018 1121 Last data filed at 05/20/2018 1116 Gross per 24 hour  Intake 500 ml  Output 650 ml  Net -150 ml  Filed Weights   05/19/18 0307 05/20/18 0332  Weight: 72.8 kg 74.1 kg    Examination:  General exam: Appears calm and comfortable.  Elderly female lying in bed.  No distress Respiratory system: Bilateral decreased breath sounds at bases with basilar crackles Cardiovascular system: S1 & S2 heard, Rate  controlled Gastrointestinal system: Abdomen is nondistended, soft and nontender. Normal bowel sounds heard. Extremities: No cyanosis, clubbing; trace edema Central nervous system: Alert and oriented. No focal neurological deficits. Moving extremities Skin: No rashes, lesions or ulcers Psychiatry: Looks anxious..     Data Reviewed: I have personally reviewed following labs and imaging studies  CBC: Recent Labs  Lab 05/19/18 0534 05/20/18 0322  WBC 4.2 7.5  NEUTROABS  --  6.4  HGB 9.8* 9.7*  HCT 33.3* 32.1*  MCV 87.6 86.1  PLT 154 975   Basic Metabolic Panel: Recent Labs  Lab 05/19/18 0534 05/20/18 0322  NA 129* 132*  K 6.0* 5.2*  CL 92* 94*  CO2 24 26  GLUCOSE 441* 301*  BUN 65* 86*  CREATININE 2.83* 2.97*  CALCIUM 9.1 9.2  MG  --  2.5*   GFR: Estimated Creatinine Clearance: 14.7 mL/min (A) (by C-G formula based on SCr of 2.97 mg/dL (H)). Liver Function Tests: Recent Labs  Lab 05/19/18 0534 05/20/18 0322  AST 49* 35  ALT 20 19  ALKPHOS 65 62  BILITOT 2.0* 1.0  PROT 7.5 7.0  ALBUMIN 3.6 3.6   No results for input(s): LIPASE, AMYLASE in the last 168 hours. No results for input(s): AMMONIA in the last 168 hours. Coagulation Profile: Recent Labs  Lab 05/19/18 0534  INR 1.28   Cardiac Enzymes: Recent Labs  Lab 05/19/18 0534 05/19/18 1038 05/19/18 1633  TROPONINI 5.68* 5.05* 5.34*   BNP (last 3 results) No results for input(s): PROBNP in the last 8760 hours. HbA1C: Recent Labs    05/19/18 0534  HGBA1C 6.9*   CBG: Recent Labs  Lab 05/19/18 0728 05/19/18 1122 05/19/18 1641 05/19/18 2059 05/20/18 0724  GLUCAP 426* 411* 260* 256* 206*   Lipid Profile: No results for input(s): CHOL, HDL, LDLCALC, TRIG, CHOLHDL, LDLDIRECT in the last 72 hours. Thyroid Function Tests: No results for input(s): TSH, T4TOTAL, FREET4, T3FREE, THYROIDAB in the last 72 hours. Anemia Panel: No results for input(s): VITAMINB12, FOLATE, FERRITIN, TIBC, IRON,  RETICCTPCT in the last 72 hours. Sepsis Labs: No results for input(s): PROCALCITON, LATICACIDVEN in the last 168 hours.  No results found for this or any previous visit (from the past 240 hour(s)).       Radiology Studies: Dg Chest 2 View  Result Date: 05/19/2018 CLINICAL DATA:  Shortness of breath for three days, former smoker, COPD, CHF, hypertension EXAM: CHEST - 2 VIEW COMPARISON:  05/18/2018 chest radiograph, PET-CT 05/17/2018 FINDINGS: LEFT subclavian pacemaker/AICD leads unchanged. Epicardial pacing leads unchanged. Enlargement of cardiac silhouette with pulmonary vascular congestion. Mediastinal contours normal. Hazy opacity RIGHT upper lobe question pneumonia, new since PET-CT. No pleural effusion or pneumothorax. Bones demineralized. IMPRESSION: Enlargement of cardiac silhouette and pulmonary vascular congestion post pacemaker/AICD. Question RIGHT upper lobe pneumonia. Electronically Signed   By: Lavonia Dana M.D.   On: 05/19/2018 10:30   Nm Pulmonary Perf And Vent  Result Date: 05/19/2018 CLINICAL DATA:  Shortness of breath EXAM: NUCLEAR MEDICINE VENTILATION - PERFUSION LUNG SCAN TECHNIQUE: Ventilation images were obtained in multiple projections using inhaled aerosol Tc-21m DTPA. Perfusion images were obtained in multiple projections after intravenous injection of Tc-31m MAA. RADIOPHARMACEUTICALS:  31.6 mCi  of Tc-22m DTPA aerosol inhalation and 4.30 mCi Tc64m MAA IV COMPARISON:  None Correlation: Chest radiograph 05/19/2018 FINDINGS: Ventilation: Enlargement of cardiac silhouette. Photopenic defect from pacemaker. Mild patchy aerosol distribution. Diminished ventilation LEFT lower lobe. Perfusion: Enlargement of cardiac silhouette. Photopenic defect from pacemaker. No segmental or subsegmental perfusion defects identified. Chest radiograph: Enlargement of cardiac silhouette and pulmonary vascular congestion post pacemaker/AICD. IMPRESSION: Very low probability for pulmonary embolism.  Electronically Signed   By: Lavonia Dana M.D.   On: 05/19/2018 11:07   US Abdomen Limited Ruq  Result Date: 05/19/2018 CLINICAL DATA:  84 year old female with elevated LFTs and cirrhosis. History of cholecystectomy. EXAM: ULTRASOUND ABDOMEN LIMITED RIGHT UPPER QUADRANT COMPARISON:  05/17/2018 PET CT, 09/29/2017 ultrasound and prior studies FINDINGS: Gallbladder: Not visualized compatible with cholecystectomy. Common bile duct: Diameter: 7 mm. No evidence of intrahepatic or extrahepatic biliary dilatation. Liver: Slightly nodular and heterogeneous liver noted without focal mass or abnormality. Portal vein is patent on color Doppler imaging with normal direction of blood flow towards the liver. IMPRESSION: 1. Slightly nodular and heterogeneous liver which would be compatible with this patient's history of cirrhosis. No focal hepatic lesions. Normal hepatopetal portal vein flow. 2. No biliary dilatation. Electronically Signed   By: Margarette Canada M.D.   On: 05/19/2018 10:24        Scheduled Meds: . aspirin  81 mg Oral Daily  . carvedilol  3.125 mg Oral BID WC  . furosemide  40 mg Intravenous Q12H  . insulin aspart  0-15 Units Subcutaneous TID WC  . insulin glargine  50 Units Subcutaneous BID  . ipratropium-albuterol  3 mL Nebulization BID  . mometasone-formoterol  2 puff Inhalation BID  . pantoprazole  40 mg Oral Daily  . predniSONE  40 mg Oral Q breakfast   Continuous Infusions: . heparin 1,200 Units/hr (05/20/18 0036)     LOS: 1 day        Aline August, MD [facility] Pager [phone]  If 7PM-7AM, please contact night-coverage [email or web address] Password [facility] 05/20/2018, 11:21 AM

## 2018-05-21 ENCOUNTER — Encounter (HOSPITAL_COMMUNITY): Payer: Self-pay | Admitting: *Deleted

## 2018-05-21 ENCOUNTER — Observation Stay (HOSPITAL_COMMUNITY): Payer: Medicare Other

## 2018-05-21 ENCOUNTER — Other Ambulatory Visit: Payer: Self-pay

## 2018-05-21 DIAGNOSIS — Z882 Allergy status to sulfonamides status: Secondary | ICD-10-CM | POA: Diagnosis not present

## 2018-05-21 DIAGNOSIS — Z79899 Other long term (current) drug therapy: Secondary | ICD-10-CM | POA: Diagnosis not present

## 2018-05-21 DIAGNOSIS — J449 Chronic obstructive pulmonary disease, unspecified: Secondary | ICD-10-CM | POA: Diagnosis present

## 2018-05-21 DIAGNOSIS — E1165 Type 2 diabetes mellitus with hyperglycemia: Secondary | ICD-10-CM | POA: Diagnosis present

## 2018-05-21 DIAGNOSIS — I25119 Atherosclerotic heart disease of native coronary artery with unspecified angina pectoris: Secondary | ICD-10-CM | POA: Diagnosis not present

## 2018-05-21 DIAGNOSIS — I509 Heart failure, unspecified: Secondary | ICD-10-CM | POA: Diagnosis not present

## 2018-05-21 DIAGNOSIS — Z881 Allergy status to other antibiotic agents status: Secondary | ICD-10-CM | POA: Diagnosis not present

## 2018-05-21 DIAGNOSIS — Z515 Encounter for palliative care: Secondary | ICD-10-CM

## 2018-05-21 DIAGNOSIS — N179 Acute kidney failure, unspecified: Secondary | ICD-10-CM | POA: Diagnosis present

## 2018-05-21 DIAGNOSIS — J441 Chronic obstructive pulmonary disease with (acute) exacerbation: Secondary | ICD-10-CM | POA: Diagnosis present

## 2018-05-21 DIAGNOSIS — E1122 Type 2 diabetes mellitus with diabetic chronic kidney disease: Secondary | ICD-10-CM | POA: Diagnosis present

## 2018-05-21 DIAGNOSIS — E785 Hyperlipidemia, unspecified: Secondary | ICD-10-CM | POA: Diagnosis present

## 2018-05-21 DIAGNOSIS — Z9981 Dependence on supplemental oxygen: Secondary | ICD-10-CM | POA: Diagnosis not present

## 2018-05-21 DIAGNOSIS — I214 Non-ST elevation (NSTEMI) myocardial infarction: Secondary | ICD-10-CM | POA: Diagnosis present

## 2018-05-21 DIAGNOSIS — I255 Ischemic cardiomyopathy: Secondary | ICD-10-CM | POA: Diagnosis present

## 2018-05-21 DIAGNOSIS — Z7189 Other specified counseling: Secondary | ICD-10-CM

## 2018-05-21 DIAGNOSIS — E1142 Type 2 diabetes mellitus with diabetic polyneuropathy: Secondary | ICD-10-CM | POA: Diagnosis present

## 2018-05-21 DIAGNOSIS — J9611 Chronic respiratory failure with hypoxia: Secondary | ICD-10-CM | POA: Diagnosis present

## 2018-05-21 DIAGNOSIS — I2510 Atherosclerotic heart disease of native coronary artery without angina pectoris: Secondary | ICD-10-CM | POA: Diagnosis present

## 2018-05-21 DIAGNOSIS — K7460 Unspecified cirrhosis of liver: Secondary | ICD-10-CM | POA: Diagnosis present

## 2018-05-21 DIAGNOSIS — N183 Chronic kidney disease, stage 3 (moderate): Secondary | ICD-10-CM | POA: Diagnosis present

## 2018-05-21 DIAGNOSIS — R0602 Shortness of breath: Secondary | ICD-10-CM | POA: Diagnosis not present

## 2018-05-21 DIAGNOSIS — R945 Abnormal results of liver function studies: Secondary | ICD-10-CM | POA: Diagnosis not present

## 2018-05-21 DIAGNOSIS — Z95810 Presence of automatic (implantable) cardiac defibrillator: Secondary | ICD-10-CM | POA: Diagnosis not present

## 2018-05-21 DIAGNOSIS — Z7982 Long term (current) use of aspirin: Secondary | ICD-10-CM | POA: Diagnosis not present

## 2018-05-21 DIAGNOSIS — Z794 Long term (current) use of insulin: Secondary | ICD-10-CM | POA: Diagnosis not present

## 2018-05-21 DIAGNOSIS — Z888 Allergy status to other drugs, medicaments and biological substances status: Secondary | ICD-10-CM | POA: Diagnosis not present

## 2018-05-21 DIAGNOSIS — I130 Hypertensive heart and chronic kidney disease with heart failure and stage 1 through stage 4 chronic kidney disease, or unspecified chronic kidney disease: Secondary | ICD-10-CM | POA: Diagnosis present

## 2018-05-21 DIAGNOSIS — I5043 Acute on chronic combined systolic (congestive) and diastolic (congestive) heart failure: Secondary | ICD-10-CM | POA: Diagnosis present

## 2018-05-21 LAB — COMPREHENSIVE METABOLIC PANEL
ALBUMIN: 3.6 g/dL (ref 3.5–5.0)
ALT: 23 U/L (ref 0–44)
AST: 31 U/L (ref 15–41)
Alkaline Phosphatase: 59 U/L (ref 38–126)
Anion gap: 10 (ref 5–15)
BILIRUBIN TOTAL: 0.9 mg/dL (ref 0.3–1.2)
BUN: 90 mg/dL — AB (ref 8–23)
CO2: 27 mmol/L (ref 22–32)
CREATININE: 2.49 mg/dL — AB (ref 0.44–1.00)
Calcium: 9.3 mg/dL (ref 8.9–10.3)
Chloride: 97 mmol/L — ABNORMAL LOW (ref 98–111)
GFR calc Af Amer: 20 mL/min — ABNORMAL LOW (ref >60)
GFR calc non Af Amer: 17 mL/min — ABNORMAL LOW (ref >60)
GLUCOSE: 249 mg/dL — AB (ref 70–99)
POTASSIUM: 4.6 mmol/L (ref 3.5–5.1)
Sodium: 134 mmol/L — ABNORMAL LOW (ref 135–145)
Total Protein: 7.1 g/dL (ref 6.5–8.1)

## 2018-05-21 LAB — CBC WITH DIFFERENTIAL/PLATELET
ABS IMMATURE GRANULOCYTES: 0.05 10*3/uL (ref 0.00–0.07)
BASOS ABS: 0 10*3/uL (ref 0.0–0.1)
Basophils Relative: 0 %
EOS ABS: 0 10*3/uL (ref 0.0–0.5)
Eosinophils Relative: 0 %
HEMATOCRIT: 32.1 % — AB (ref 36.0–46.0)
HEMOGLOBIN: 9.5 g/dL — AB (ref 12.0–15.0)
IMMATURE GRANULOCYTES: 1 %
LYMPHS ABS: 0.5 10*3/uL — AB (ref 0.7–4.0)
LYMPHS PCT: 6 %
MCH: 25.5 pg — ABNORMAL LOW (ref 26.0–34.0)
MCHC: 29.6 g/dL — ABNORMAL LOW (ref 30.0–36.0)
MCV: 86.1 fL (ref 80.0–100.0)
MONOS PCT: 8 %
Monocytes Absolute: 0.6 10*3/uL (ref 0.1–1.0)
NEUTROS ABS: 6.9 10*3/uL (ref 1.7–7.7)
NEUTROS PCT: 85 %
NRBC: 0 % (ref 0.0–0.2)
Platelets: 175 10*3/uL (ref 150–400)
RBC: 3.73 MIL/uL — ABNORMAL LOW (ref 3.87–5.11)
RDW: 15.9 % — ABNORMAL HIGH (ref 11.5–15.5)
WBC: 8.1 10*3/uL (ref 4.0–10.5)

## 2018-05-21 LAB — GLUCOSE, CAPILLARY
Glucose-Capillary: 358 mg/dL — ABNORMAL HIGH (ref 70–99)
Glucose-Capillary: 195 mg/dL — ABNORMAL HIGH (ref 70–99)
Glucose-Capillary: 268 mg/dL — ABNORMAL HIGH (ref 70–99)
Glucose-Capillary: 218 mg/dL — ABNORMAL HIGH (ref 70–99)
Glucose-Capillary: 347 mg/dL — ABNORMAL HIGH (ref 70–99)

## 2018-05-21 LAB — URINALYSIS, ROUTINE W REFLEX MICROSCOPIC
Bilirubin Urine: NEGATIVE
GLUCOSE, UA: 50 mg/dL — AB
Hgb urine dipstick: NEGATIVE
KETONES UR: NEGATIVE mg/dL
LEUKOCYTES UA: NEGATIVE
Nitrite: NEGATIVE
PH: 5 (ref 5.0–8.0)
Protein, ur: NEGATIVE mg/dL
Specific Gravity, Urine: 1.01 (ref 1.005–1.030)

## 2018-05-21 LAB — MAGNESIUM: Magnesium: 2.4 mg/dL (ref 1.7–2.4)

## 2018-05-21 LAB — HEPARIN LEVEL (UNFRACTIONATED): HEPARIN UNFRACTIONATED: 0.82 [IU]/mL — AB (ref 0.30–0.70)

## 2018-05-21 MED ORDER — MAGIC MOUTHWASH W/LIDOCAINE
5.0000 mL | Freq: Four times a day (QID) | ORAL | Status: DC | PRN
  Administered 2018-05-21 – 2018-05-22 (×2): 5 mL via ORAL
  Filled 2018-05-21 (×3): qty 5

## 2018-05-21 MED ORDER — INSULIN ASPART 100 UNIT/ML ~~LOC~~ SOLN
5.0000 [IU] | Freq: Three times a day (TID) | SUBCUTANEOUS | Status: DC
  Administered 2018-05-21 – 2018-05-22 (×4): 5 [IU] via SUBCUTANEOUS

## 2018-05-21 MED ORDER — SENNOSIDES-DOCUSATE SODIUM 8.6-50 MG PO TABS
1.0000 | ORAL_TABLET | Freq: Two times a day (BID) | ORAL | Status: DC
  Administered 2018-05-21 – 2018-05-22 (×3): 1 via ORAL
  Filled 2018-05-21 (×3): qty 1

## 2018-05-21 MED ORDER — HEPARIN SODIUM (PORCINE) 5000 UNIT/ML IJ SOLN: 5000.0000 [IU] | Freq: Three times a day (TID) | INTRAMUSCULAR | Status: DC

## 2018-05-21 MED ORDER — INSULIN GLARGINE 100 UNIT/ML ~~LOC~~ SOLN
60.0000 [IU] | Freq: Two times a day (BID) | SUBCUTANEOUS | Status: DC
  Administered 2018-05-21 – 2018-05-22 (×2): 60 [IU] via SUBCUTANEOUS
  Filled 2018-05-21 (×3): qty 0.6

## 2018-05-21 NOTE — Progress Notes (Signed)
ANTICOAGULATION CONSULT NOTE   Pharmacy Consult for Heparin  Indication: chest pain/ACS and and rule out PE  Allergies  Allergen Reactions  . Augmentin [Amoxicillin-Pot Clavulanate]     Abdominal pain  . Metformin     Upset stomach  . Metolazone     "made everything hurt" 07/03/15  . Sulfonamide Derivatives     swelling   Patient Measurements: Height: 5\' 5"  (165.1 cm) Weight: 163 lb 4.8 oz (74.1 kg) IBW/kg (Calculated) : 57  HEPARIN DW (KG): 71.7  Vital Signs: Temp: 97.6 F (36.4 C) (11/25 0350) Temp Source: Oral (11/25 0350) BP: 130/62 (11/25 0820) Pulse Rate: 71 (11/25 0820)  Labs: Recent Labs    05/19/18 0534 05/19/18 1038  05/19/18 1633  05/20/18 0322 05/20/18 0831 05/20/18 1819 05/21/18 0410 05/21/18 0820  HGB 9.8*  --   --   --   --  9.7*  --   --  9.5*  --   HCT 33.3*  --   --   --   --  32.1*  --   --  32.1*  --   PLT 154  --   --   --   --  155  --   --  175  --   LABPROT 15.9*  --   --   --   --   --   --   --   --   --   INR 1.28  --   --   --   --   --   --   --   --   --   HEPARINUNFRC  --   --    < >  --    < >  --  0.34 0.42  --  0.82*  CREATININE 2.83*  --   --   --   --  2.97*  --   --  2.49*  --   TROPONINI 5.68* 5.05*  --  5.34*  --   --   --   --   --   --    < > = values in this interval not displayed.    Estimated Creatinine Clearance: 17.5 mL/min (A) (by C-G formula based on SCr of 2.49 mg/dL (H)).   Medical History: Past Medical History:  Diagnosis Date  . Aneurysm ([place])    Apical; previously on Coumadin  . Anxiety disorder   . Cancer (HCC)    Skin  . Chronic renal insufficiency    Creatinine 1.17 January 2010  . Coronary artery disease    Cath 02/15/12 revealed RCA as culprit lesion w/ occlusion at site of prior stent, patent LAD stent, chronic LCx and Diagonal dz; Medical therapy   . Depression   . GERD (gastroesophageal reflux disease)   . Hyperlipidemia    H/o GI intolerance to Lipitor  . Impingement syndrome of left shoulder    . Ischemic cardiomyopathy    s/p Medtronic BiV ICD 2006  . Peripheral polyneuropathy    Refractory to several medications  . Systolic and diastolic CHF, chronic (HCC)    echo 03/2012 EF 76-28%, mod diastolic dysfunction, mod MR, mod LAE, mod-severe TR, PASP 17mmHg  . Type 2 diabetes mellitus [name])     [name] transfer from [facility] with chest pain and shortness of breath. Pharmacy consulted for heparin dosing. VQ scan showing very low probability for PE.  Heparin level supratherapeutic this morning at 0.82, on 1200 units/hr. CBC low but stable with Hgb 9.5  and platelets 175. Per RN, no bleeding reported and no issues with the heparin infusion.  Goal of Therapy:  Heparin level 0.3-0.7 units/ml Monitor platelets by anticoagulation protocol: Yes   Plan:  Decrease heparin infusion to 1050 units/hr Check heparin level in 8 hours Monitor daily heparin level, CBC, s/sx of bleeding  Follow up goals of care discussions   [name], PharmD PGY1 Pharmacy Resident Phone [phone]  05/21/2018   9:34 AM

## 2018-05-21 NOTE — Progress Notes (Signed)
Admit: 05/19/2018 LOS: 0  [name] AoCKD, NSTEMI, A/C CHF, COPD; numerous comorbidities  Subjective:  . No c/o this AM  . 1.7L UOP . SCr improved to 2.49 this AM< BUN 90, K 4.6; on prednisone . Renal US w/o obstruction; negative for acute issues . Lasix 40 IV BID . Confirmed she would not want HD if needed   11/24 0701 - 11/25 0700 In: 1048.8 [P.O.:920; I.V.:128.8] Out: 1700 [Urine:1700]  Filed Weights   05/19/18 0307 05/20/18 0332 05/21/18 0350  Weight: 72.8 kg 74.1 kg 74.1 kg    Scheduled Meds: . aspirin  81 mg Oral Daily  . carvedilol  3.125 mg Oral BID WC  . furosemide  40 mg Intravenous Q12H  . insulin aspart  0-15 Units Subcutaneous TID WC  . insulin glargine  50 Units Subcutaneous BID  . ipratropium-albuterol  3 mL Nebulization BID  . mometasone-formoterol  2 puff Inhalation BID  . pantoprazole  40 mg Oral Daily  . predniSONE  40 mg Oral Q breakfast   Continuous Infusions: . heparin 1,200 Units/hr (05/20/18 2012)   PRN Meds:.acetaminophen **OR** acetaminophen, ALPRAZolam, alum & mag hydroxide-simeth, ipratropium-albuterol, morphine injection, oxyCODONE, polyvinyl alcohol  Current Labs: reviewed    Physical Exam:  Blood pressure 130/62, pulse 71, temperature 97.6 F (36.4 C), temperature source Oral, resp. rate 15, height 5\' 5"  (1.651 m), weight 74.1 kg, SpO2 99 %. NAD, chronically ill appearing; frail RRR  S/nt/nd CTAB No LEE AAO x3, nonfocal   A 1. AoCKD, BL SCr 1.2-1.9 this year, neg [place]; UA w/o protein, blood; improving this AM 2. NSTEMI; no LHC planned 3. A/C CHF, ICM; LVEF 20-25% 4. COPD w/ exacerbation 5. DM2 6. AFIb  P  No futher suggestions, renal function improving   Can f/u outpt with [facility] in [place]  Not an HD candidate  Daily weights, Daily Renal Panel, Strict I/Os, Avoid nephrotoxins (NSAIDs, judicious IV Contrast)    Pearson Grippe MD 05/21/2018, 10:04 AM  Recent Labs  Lab 05/19/18 0534 05/20/18 0322 05/21/18 0410  NA 129*  132* 134*  K 6.0* 5.2* 4.6  CL 92* 94* 97*  CO2 24 26 27   GLUCOSE 441* 301* 249*  BUN 65* 86* 90*  CREATININE 2.83* 2.97* 2.49*  CALCIUM 9.1 9.2 9.3   Recent Labs  Lab 05/19/18 0534 05/20/18 0322 05/21/18 0410  WBC 4.2 7.5 8.1  NEUTROABS  --  6.4 6.9  HGB 9.8* 9.7* 9.5*  HCT 33.3* 32.1* 32.1*  MCV 87.6 86.1 86.1  PLT 154 155 175

## 2018-05-21 NOTE — Consult Note (Signed)
Consultation Note Date: 05/21/2018   Patient Name: [name]  DOB: 1933-06-30  MRN: [id number]  Age / Sex: 84 y.o., female  PCP: Loman Brooklyn, FNP Referring Physician: Aline August, MD  Reason for Consultation: Establishing goals of care  HPI/Patient Profile: [name] is a 84 y.o. female with medical history significant of COPD on 2 L oxygen, chronic combined systolic and diastolic congestive heart failure, ischemic cardiomyopathy, status post AICD, CAD, type 2 diabetes, hyperlipidemia, CKD presenting to the hospital as a transfer from [facility] for evaluation of shortness of breath and chest pain.  Clinical Assessment and Goals of Care: Patient is resting in bed. She is irritated she had to get up 3 times overnight to urinate. She states she is widowed. She lives alone. She has 1 son and a grandson. Hr daughter-in-law died 2 months ago. Ms. Nay states her daughter-in-law had hospice care.   She states she has a "place" on the roof of her mouth. She states it was confirmed to be cancer, and she had a PET scan which she is awaiting the results for. She is aware of the changes of her EF. She states I'm not worried, I'm  84 years old and I never thought I would live this long".   At home, she uses a walker with a seat, and sits on it to wash dishes. She has meals on wheels and states her appetite has been poor since she developed the "place" on her palate.    We discussed her diagnosis, prognosis, GOC, EOL wishes disposition and options.  A detailed discussion was had today regarding advanced directives.  Concepts specific to code status, artifical feeding and hydration, IV antibiotics and rehospitalization were discussed.  The difference between an aggressive medical intervention path and a comfort care path was discussed.  Values and goals of care important to patient  and family were attempted to be elicited.  She states she does not know how she wants to proceed at this point. She states it depends on how her new EF affects her quality of life, and on if she only has palantine cancer, or if there is metastasis, as well as what treatment would look like.   She discusses neuropathic pain in her legs, and that Neurontin and Lyrica did not work. She states she was taking Xanax and Dilaudid pills for the pain, but her current PCP is attempting to wean her from the Dilaudid.   Quality of life is very important to her. She states quality of life is very dependent on her independence. She states she would transition to hospice care before moving from her home. She is familiar with hospice 2/2 her daughter-in-law. She states at this point she would like to treat the treatable. She would not want chest compressions, shocks, or a breathing tube for cardio/pulmonary failure. She states she would not want to be placed on a ventilator for respiratory distress.   Recommend palliative to follow outpatient.       SUMMARY OF  RECOMMENDATIONS   Recommend palliative to follow at D/C.    Code Status/Advance Care Planning:  DNR    Symptom Management:   Per primary team.   Prognosis:   < 6 months EF reduced to 20-25%. Cancer of hard palate.   Discharge Planning: Home with Palliative Services      Primary Diagnoses: Present on Admission: . SOB (shortness of breath) . Stage III chronic kidney disease ([place])   I have reviewed the medical record, interviewed the patient and family, and examined the patient. The following aspects are pertinent.  Past Medical History:  Diagnosis Date  . Aneurysm ([place])    Apical; previously on Coumadin  . Anxiety disorder   . Cancer (HCC)    Skin  . Chronic renal insufficiency    Creatinine 1.17 January 2010  . Coronary artery disease    Cath 02/15/12 revealed RCA as culprit lesion w/ occlusion at site of prior stent, patent LAD  stent, chronic LCx and Diagonal dz; Medical therapy   . Depression   . GERD (gastroesophageal reflux disease)   . Hyperlipidemia    H/o GI intolerance to Lipitor  . Impingement syndrome of left shoulder   . Ischemic cardiomyopathy    s/p Medtronic BiV ICD 2006  . Peripheral polyneuropathy    Refractory to several medications  . Systolic and diastolic CHF, chronic (HCC)    echo 03/2012 EF 44-01%, mod diastolic dysfunction, mod MR, mod LAE, mod-severe TR, PASP 55mmHg  . Type 2 diabetes mellitus ([place])    Social History   Socioeconomic History  . Marital status: Widowed    Spouse name: Not on file  . Number of children: Not on file  . Years of education: Not on file  . Highest education level: Not on file  Occupational History  . Occupation: Retired    Fish farm manager: RETIRED  Social Needs  . Financial resource strain: Not on file  . Food insecurity:    Worry: Not on file    Inability: Not on file  . Transportation needs:    Medical: Not on file    Non-medical: Not on file  Tobacco Use  . Smoking status: Former Smoker    Packs/day: 0.80    Years: 30.00    Pack years: 24.00    Types: Cigarettes    Start date: 04/05/1957    Last attempt to quit: 06/27/2002    Years since quitting: 15.9  . Smokeless tobacco: Never Used  Substance and Sexual Activity  . Alcohol use: No    Alcohol/week: 0.0 standard drinks  . Drug use: No  . Sexual activity: Not Currently  Lifestyle  . Physical activity:    Days per week: Not on file    Minutes per session: Not on file  . Stress: Not on file  Relationships  . Social connections:    Talks on phone: Not on file    Gets together: Not on file    Attends religious service: Not on file    Active member of club or organization: Not on file    Attends meetings of clubs or organizations: Not on file    Relationship status: Not on file  Other Topics Concern  . Not on file  Social History Narrative  . Not on file   Family History  Problem  Relation Age of Onset  . Heart attack Father   . Heart disease Sister   . Heart disease Brother   . Coronary artery disease Neg Hx  Scheduled Meds: . aspirin  81 mg Oral Daily  . carvedilol  3.125 mg Oral BID WC  . furosemide  40 mg Intravenous Q12H  . insulin aspart  0-15 Units Subcutaneous TID WC  . insulin aspart  5 Units Subcutaneous TID WC  . insulin glargine  60 Units Subcutaneous BID  . ipratropium-albuterol  3 mL Nebulization BID  . mometasone-formoterol  2 puff Inhalation BID  . pantoprazole  40 mg Oral Daily  . predniSONE  40 mg Oral Q breakfast  . senna-docusate  1 tablet Oral BID   Continuous Infusions: . heparin 1,050 Units/hr (05/21/18 1101)   PRN Meds:.acetaminophen **OR** acetaminophen, ALPRAZolam, alum & mag hydroxide-simeth, ipratropium-albuterol, morphine injection, oxyCODONE, polyvinyl alcohol Medications Prior to Admission:  Prior to Admission medications   Medication Sig Start Date End Date Taking? Authorizing Provider  albuterol (ACCUNEB) 0.63 MG/3ML nebulizer solution Take 1 ampule by nebulization every 6 (six) hours as needed for wheezing.   Yes [provider]  allopurinol (ZYLOPRIM) 100 MG tablet Take 200 mg by mouth daily.    Yes [provider]  ALPRAZolam (XANAX) 0.25 MG tablet Take 0.25 mg by mouth at bedtime as needed for sleep.   Yes [provider]  carvedilol (COREG) 3.125 MG tablet Take 1 tablet (3.125 mg total) by mouth 2 (two) times daily with a meal. 09/15/17  Yes Herminio Commons, MD  citalopram (CELEXA) 20 MG tablet Take 20 mg by mouth daily.    Yes [provider]  diclofenac sodium (VOLTAREN) 1 % GEL Apply 2 g topically 4 (four) times daily. 05/03/18 05/03/19 Yes [provider]  HYDROmorphone (DILAUDID) 4 MG tablet Take 4 mg by mouth 2 (two) times daily as needed for severe pain.   Yes [provider]  nitroGLYCERIN (NITROSTAT) 0.4 MG SL tablet Place 1 tablet (0.4 mg total) under the  tongue every 5 (five) minutes as needed for chest pain. 06/14/13  Yes Minus Breeding, MD  OXYGEN Inhale 2 L into the lungs continuous.   Yes [provider]  potassium chloride SA (K-DUR,KLOR-CON) 20 MEQ tablet Take 1 tablet (20 mEq total) by mouth daily. 03/14/18  Yes Herminio Commons, MD  ranitidine (ZANTAC) 150 MG tablet Take 150 mg by mouth daily.    Yes [provider]  torsemide (DEMADEX) 20 MG tablet Take 2 tablets (40 mg total) by mouth every morning. & 20 mg in the evening 03/20/18  Yes Herminio Commons, MD  Ascorbic Acid (VITAMIN C) 500 MG CAPS Take 100 mg by mouth daily.     [provider]  aspirin 81 MG chewable tablet Chew 1 tablet (81 mg total) by mouth daily. 06/06/15   Rexene Alberts, MD  cyanocobalamin 2000 MCG tablet Take 2,000 mcg by mouth daily.    [provider]  ergocalciferol (VITAMIN D2) 50000 units capsule Take 50,000 Units by mouth 2 (two) times a week.    [provider]  insulin glargine (LANTUS) 100 unit/mL SOPN Inject 50 Units into the skin 2 (two) times daily. 50 units in the morning and 50 units in the evening    [provider]  metolazone (ZAROXOLYN) 2.5 MG tablet Take 1 tablet (2.5 mg total) by mouth as needed (5 days per week per Dr. Bronson Ing). Patient not taking: Reported on 05/19/2018 03/14/18   Herminio Commons, MD   Allergies  Allergen Reactions  . Augmentin [Amoxicillin-Pot Clavulanate]     Abdominal pain  . Metformin  Upset stomach  . Metolazone     "made everything hurt" 07/03/15  . Sulfonamide Derivatives     swelling   Review of Systems  HENT:       Palate pain  Neurological:       Lower extremity neuropathy.    Physical Exam  Constitutional: No distress.  Pulmonary/Chest: Effort normal.  Neurological: She is alert.  Oriented.   Skin: Skin is warm and dry.    Vital Signs: BP 104/76 (BP Location: Right Arm)   Pulse 70   Temp 97.6 F (36.4 C) (Oral)   Resp 18    Ht 5\' 5"  (1.651 m)   Wt 74.1 kg   SpO2 97%   BMI 27.17 kg/m  Pain Scale: 0-10   Pain Score: 0-No pain   SpO2: SpO2: 97 % O2 Device:SpO2: 97 % O2 Flow Rate: .O2 Flow Rate (L/min): 3 L/min  IO: Intake/output summary:   Intake/Output Summary (Last 24 hours) at 05/21/2018 1440 Last data filed at 05/21/2018 1200 Gross per 24 hour  Intake 1548.82 ml  Output 1350 ml  Net 198.82 ml    LBM: Last BM Date: 05/18/18 Baseline Weight: Weight: 72.8 kg Most recent weight: Weight: 74.1 kg     Palliative Assessment/Data: 60%   Flowsheet Rows     Most Recent Value  Intake Tab  Referral Department  Hospitalist  Unit at Time of Referral  Gyn Unit  Date Notified  05/19/18  Palliative Care Type  New Palliative care  Reason for referral  Clarify Goals of Care  Date of Admission  05/19/18  # of days IP prior to Palliative referral  0  Clinical Assessment  Psychosocial & Spiritual Assessment  Palliative Care Outcomes      Time In: 1:45 Time Out: 2:55 Time Total: 70 min Greater than 50%  of this time was spent counseling and coordinating care related to the above assessment and plan.  Signed by: Asencion Gowda, NP   Please contact Palliative Medicine Team phone at [phone] for questions and concerns.  For individual provider: See Shea Evans

## 2018-05-21 NOTE — Evaluation (Signed)
Physical Therapy Evaluation Patient Details Name: [name] MRN: [id number] DOB: 1934-01-03 Today's Date: 05/21/2018   History of Present Illness  Pt is an 84 y/o female transferred from [facility] secondary to chest pain and SOB. Found to have NSTEMI, CHF exacerbation, and AKI. VQ scan showed low probability for PE. PMH includes DM, CAD s/p PCI, NSTEMI, CHF, a fib, and COPD on 2L of O2.   Clinical Impression  Pt admitted secondary to problem above with deficits below. Pt requiring min guard A for mobility with RW. Pt was shaky throughout, however, no overt LOB noted. Pt very adamant about going home at d/c and does not want to go to SNF. Educated about need for assist at home and pt reports her grandson can assist at home. Will continue to follow acutely to maximize functional mobility independence and safety.     Follow Up Recommendations Home health PT;Supervision for mobility/OOB(refusing SNF )    Equipment Recommendations  None recommended by PT    Recommendations for Other Services       Precautions / Restrictions Precautions Precautions: Fall Restrictions Weight Bearing Restrictions: No      Mobility  Bed Mobility               General bed mobility comments: Sitting at EOB upon entry  Transfers Overall transfer level: Needs assistance Equipment used: Rolling walker (2 wheeled) Transfers: Sit to/from [facility] Sit to Stand: Min guard Stand pivot transfers: Min guard       General transfer comment: Min guard for steadying to stand. Increased time required. Min guard for safety during transfer to [facility] using RW. Pt slightly shaky during transfer, but no LOB noted.   Ambulation/Gait Ambulation/Gait assistance: Min guard Gait Distance (Feet): 125 Feet Assistive device: Rolling walker (2 wheeled) Gait Pattern/deviations: Step-through pattern;Decreased stride length Gait velocity: Decreased    General Gait Details: Slow, guarded  gait. Some shakiness noted, however, no overt LOB noted. Pt complaining of neuropathy in feet throughout gait. Educated about importance of wearing shoes during ambulation.   Stairs            Wheelchair Mobility    Modified Rankin (Stroke Patients Only)       Balance Overall balance assessment: Needs assistance Sitting-balance support: No upper extremity supported;Feet supported Sitting balance-Leahy Scale: Fair     Standing balance support: Bilateral upper extremity supported;During functional activity Standing balance-Leahy Scale: Poor Standing balance comment: Reliant on BUE support                              Pertinent Vitals/Pain Pain Assessment: No/denies pain    Home Living Family/patient expects to be discharged to:: Private residence Living Arrangements: Alone Available Help at Discharge: Family;Available 24 hours/day Type of Home: House Home Access: Stairs to enter Entrance Stairs-Rails: Left Entrance Stairs-Number of Steps: 2 Home Layout: One level Home Equipment: Walker - 4 wheels;Cane - single point;Shower seat;Bedside commode;Toilet riser Additional Comments: Reports Grandson can stay with her at d/c     Prior Function Level of Independence: Independent with assistive device(s)         Comments: Uses rollator for ambulation     Hand Dominance        Extremity/Trunk Assessment   Upper Extremity Assessment Upper Extremity Assessment: Generalized weakness    Lower Extremity Assessment Lower Extremity Assessment: Generalized weakness;LLE deficits/detail;RLE deficits/detail RLE Sensation: history of peripheral neuropathy LLE Sensation: history of peripheral  neuropathy    Cervical / Trunk Assessment Cervical / Trunk Assessment: Kyphotic  Communication   Communication: No difficulties  Cognition Arousal/Alertness: Awake/alert Behavior During Therapy: WFL for tasks assessed/performed Overall Cognitive Status: Within  Functional Limits for tasks assessed                                        General Comments General comments (skin integrity, edema, etc.): VSS throughout. Pt very adamant about going home and reports she won't go to SNF. Reports grandson can stay with her at d/c.     Exercises     Assessment/Plan    PT Assessment Patient needs continued PT services  PT Problem List Decreased strength;Decreased balance;Decreased activity tolerance;Decreased mobility;Decreased knowledge of use of DME       PT Treatment Interventions DME instruction;Gait training;Stair training;Functional mobility training;Therapeutic activities;Therapeutic exercise;Balance training;Patient/family education    PT Goals (Current goals can be found in the Care Plan section)  Acute Rehab PT Goals Patient Stated Goal: to go home  PT Goal Formulation: With patient Time For Goal Achievement: 06/04/18 Potential to Achieve Goals: Good    Frequency Min 3X/week   Barriers to discharge        Co-evaluation               AM-PAC PT "6 Clicks" Mobility  Outcome Measure Help needed turning from your back to your side while in a flat bed without using bedrails?: A Little Help needed moving from lying on your back to sitting on the side of a flat bed without using bedrails?: A Little Help needed moving to and from a bed to a chair (including a wheelchair)?: A Little Help needed standing up from a chair using your arms (e.g., wheelchair or bedside chair)?: A Little Help needed to walk in hospital room?: A Little Help needed climbing 3-5 steps with a railing? : A Lot 6 Click Score: 17    End of Session Equipment Utilized During Treatment: Gait belt;Oxygen Activity Tolerance: Patient tolerated treatment well Patient left: in bed;with call bell/phone within reach;with bed alarm set(sitting EOB ) Nurse Communication: Mobility status PT Visit Diagnosis: Muscle weakness (generalized)  (M62.81);Unsteadiness on feet (R26.81)    Time: 8757-9728 PT Time Calculation (min) (ACUTE ONLY): 21 min   Charges:   PT Evaluation $PT Eval Moderate Complexity: 1 Mod          Leighton Ruff, PT, DPT  Acute Rehabilitation Services  Pager: [phone] Office: [phone]   Rudean Hitt 05/21/2018, 12:53 PM

## 2018-05-21 NOTE — Progress Notes (Signed)
Patient ID: [name], female   DOB: [birth year], 84 y.o.   MRN: [id number]  PROGRESS NOTE    [name]  PFX:[id number] DOB: 05/16/1934 DOA: 05/19/2018 PCP: Loman Brooklyn, FNP   Brief Narrative:  84 year old female with history of COPD on 2 L oxygen, chronic combined systolic and diastolic congestive heart failure, ischemic cardia myopathy status post AICD, CAD, diabetes mellitus type 2, hyperlipidemia, chronic kidney disease was transferred from [facility] for evaluation of shortness of breath and chest pain.  She was found to have elevated troponin and acute kidney injury.  Chest x-ray showed pulmonary vascular congestion.  She was started on intravenous Lasix.  Cardiology was consulted.  Nephrology was also consulted for worsening renal function.   Assessment & Plan:   Principal Problem:   SOB (shortness of breath) Active Problems:   Shortness of breath   Type 2 diabetes mellitus (HCC)   CAD (coronary artery disease)   Non-ST elevation (NSTEMI) myocardial infarction (HCC)   Stage III chronic kidney disease (HCC)   CHF exacerbation (HCC)   Chest pain   Abnormal LFTs   Non-STEMI -Cardiology following.  No plans for cardiac catheterization because of renal function and patient being on not a good dialysis candidate.  Currently on heparin drip.  VQ scan was very low probability for PE -Continue aspirin, Coreg.  Echo shows worsening EF of 20 to 25% with inferior akinesis and severe global hypokinesis and grade 2 diastolic dysfunction. -Currently chest pain-free.  Acute on chronic combined systolic and diastolic heart failure -Strict input and output.  Daily weights.  Continue IV Lasix.  Cardiology and nephrology following.  Continue Coreg.  COPD with probable mild exacerbation along with chronic hypoxic respiratory failure -Continue Dulera along with duo nebs.  Continue oral prednisone for total of 5 days.  Respiratory status stable.  Acute kidney  injury on chronic kidney disease stage III -Creatinine is 2.49 today.  Monitor.  Nephrology following.  Renal ultrasound was negative for hydronephrosis. -Patient is not a dialysis candidate as per nephrology.  Patient also does not want dialysis.  Diabetes mellitus type II uncontrolled with hyperglycemia with peripheral neuropathy -Continue Lantus twice a day along with Accu-Cheks with sliding scale coverage.  We will also add meal coverage with NovoLog. -A1c 6.9 -Patient complains of bilateral feet pain and is requesting for Dilaudid that she uses as an outpatient.  Explained to her that her respiratory status might worsen with Dilaudid.  Will use oxycodone and as needed morphine for now.  Abnormal LFTs -Resolved.  Bili total has normalized.  Right upper quadrant ultrasound showed nodular and heterogeneous liver which could be compatible with the patient's history of cirrhosis.  No focal hepatic lesions and no biliary dilatation.  Outpatient follow-up  Coronary artery disease -Continue aspirin, beta-blocker.  Not on statin.  No current chest pain.  Cardiology following  Generalized deconditioning -Overall prognosis is guarded considering her worsening renal function.  Palliative care evaluation is still pending. -PT/OT eval  DVT prophylaxis: Heparin Code Status: DNR Family Communication: None at bedside Disposition Plan: Home in 1 to 2 days once cleared by cardiology/nephrology  Consultants: Cardiology/palliative care/palliative care  Procedures:  Echo Study Conclusions  - Procedure narrative: Transthoracic echocardiography. Image   quality was suboptimal. The study was technically difficult, as a   result of poor acoustic windows, poor sound wave transmission,   and body habitus. Intravenous contrast (Definity) was   administered. - Left ventricle: The cavity size was moderately dilated. Wall  thickness was normal. Systolic function was severely reduced. The   estimated  ejection fraction was in the range of 20% to 25%.   Doppler parameters are consistent with pseudonormal left   ventricular relaxation (grade 2 diastolic dysfunction). The E/e&'   ratio is >15, suggesting elevated LV filling pressure. - Aortic valve: Valve area (VTI): 2.35 cm^2. Valve area (Vmean):   2.39 cm^2. - Mitral valve: Calcified annulus. Mildly thickened leaflets .   There was mild regurgitation. - Left atrium: Severely dilated. - Right ventricle: The cavity size was mildly dilated. Mild   systolic dysfunction. Pacer wire or catheter noted in right   ventricle. - Right atrium: Moderately dilated. Pacer wire or catheter noted in   right atrium. - Tricuspid valve: There was mild regurgitation. - Pulmonary arteries: PA peak pressure: 24 mm Hg (S) + RAP. - Systemic veins: Not visualized.  Impressions:  - Technically difficult study. Definity contrast given. Compared to   a prior study in 2018, the LVEF has further declined to 20-25%   with inferior akinesis and severe global hypokinesis.   Antimicrobials: None   Subjective: Patient seen and examined at bedside.  She denies worsening shortness of breath, cough or chest pain.  She is asking when she can go home.  No overnight fever or vomiting. Objective: Vitals:   05/21/18 0350 05/21/18 0731 05/21/18 0733 05/21/18 0820  BP: (!) 121/59   130/62  Pulse: 71   71  Resp: 15     Temp: 97.6 F (36.4 C)     TempSrc: Oral     SpO2: 100% 99% 99%   Weight: 74.1 kg     Height:        Intake/Output Summary (Last 24 hours) at 05/21/2018 1055 Last data filed at 05/21/2018 0653 Gross per 24 hour  Intake 828.82 ml  Output 1700 ml  Net -871.18 ml   Filed Weights   05/19/18 0307 05/20/18 0332 05/21/18 0350  Weight: 72.8 kg 74.1 kg 74.1 kg    Examination:  General exam: Appears calm and comfortable.  Elderly female lying in bed.  No acute distress  respiratory system: Bilateral decreased breath sounds at bases with basilar  crackles, no wheezing Cardiovascular system: S1 & S2 heard, Rate controlled Gastrointestinal system: Abdomen is nondistended, soft and nontender. Normal bowel sounds heard. Extremities: No cyanosis; trace edema   Data Reviewed: I have personally reviewed following labs and imaging studies  CBC: Recent Labs  Lab 05/19/18 0534 05/20/18 0322 05/21/18 0410  WBC 4.2 7.5 8.1  NEUTROABS  --  6.4 6.9  HGB 9.8* 9.7* 9.5*  HCT 33.3* 32.1* 32.1*  MCV 87.6 86.1 86.1  PLT 154 155 224   Basic Metabolic Panel: Recent Labs  Lab 05/19/18 0534 05/20/18 0322 05/21/18 0410  NA 129* 132* 134*  K 6.0* 5.2* 4.6  CL 92* 94* 97*  CO2 24 26 27   GLUCOSE 441* 301* 249*  BUN 65* 86* 90*  CREATININE 2.83* 2.97* 2.49*  CALCIUM 9.1 9.2 9.3  MG  --  2.5* 2.4   GFR: Estimated Creatinine Clearance: 17.5 mL/min (A) (by C-G formula based on SCr of 2.49 mg/dL (H)). Liver Function Tests: Recent Labs  Lab 05/19/18 0534 05/20/18 0322 05/21/18 0410  AST 49* 35 31  ALT 20 19 23   ALKPHOS 65 62 59  BILITOT 2.0* 1.0 0.9  PROT 7.5 7.0 7.1  ALBUMIN 3.6 3.6 3.6   No results for input(s): LIPASE, AMYLASE in the last 168 hours. No results for  input(s): AMMONIA in the last 168 hours. Coagulation Profile: Recent Labs  Lab 05/19/18 0534  INR 1.28   Cardiac Enzymes: Recent Labs  Lab 05/19/18 0534 05/19/18 1038 05/19/18 1633  TROPONINI 5.68* 5.05* 5.34*   BNP (last 3 results) No results for input(s): PROBNP in the last 8760 hours. HbA1C: Recent Labs    05/19/18 0534  HGBA1C 6.9*   CBG: Recent Labs  Lab 05/20/18 0724 05/20/18 1117 05/20/18 1615 05/20/18 2116 05/21/18 0747  GLUCAP 206* 345* 329* 358* 195*   Lipid Profile: No results for input(s): CHOL, HDL, LDLCALC, TRIG, CHOLHDL, LDLDIRECT in the last 72 hours. Thyroid Function Tests: No results for input(s): TSH, T4TOTAL, FREET4, T3FREE, THYROIDAB in the last 72 hours. Anemia Panel: No results for input(s): VITAMINB12, FOLATE,  FERRITIN, TIBC, IRON, RETICCTPCT in the last 72 hours. Sepsis Labs: No results for input(s): PROCALCITON, LATICACIDVEN in the last 168 hours.  No results found for this or any previous visit (from the past 240 hour(s)).       Radiology Studies: US Renal  Result Date: 05/21/2018 CLINICAL DATA:  Acute on chronic renal failure. EXAM: RENAL / URINARY TRACT ULTRASOUND COMPLETE COMPARISON:  PET CT 05/17/2018 FINDINGS: Right Kidney: Renal measurements: 9.9 x 5.0 x 6.2 cm = volume: 159.9 mL . Echogenicity within normal limits. No mass or hydronephrosis visualized. Left Kidney: Renal measurements: 9.2 x 4.8 x 4.1 cm = volume: 93.5 mL. Echogenicity within normal limits. No mass or hydronephrosis visualized. Bladder: Appears normal for degree of bladder distention. IMPRESSION: Unremarkable renal ultrasound.  No obstructive uropathy. Electronically Signed   By: Keith Rake M.D.   On: 05/21/2018 05:06        Scheduled Meds: . aspirin  81 mg Oral Daily  . carvedilol  3.125 mg Oral BID WC  . furosemide  40 mg Intravenous Q12H  . insulin aspart  0-15 Units Subcutaneous TID WC  . insulin glargine  50 Units Subcutaneous BID  . ipratropium-albuterol  3 mL Nebulization BID  . mometasone-formoterol  2 puff Inhalation BID  . pantoprazole  40 mg Oral Daily  . predniSONE  40 mg Oral Q breakfast   Continuous Infusions: . heparin 1,200 Units/hr (05/20/18 2012)     LOS: 0 days        Aline August, MD [facility] Pager [phone]  If 7PM-7AM, please contact night-coverage [email or web address] Password [facility] 05/21/2018, 10:55 AM

## 2018-05-21 NOTE — Telephone Encounter (Signed)
Agree with recommendation, patient is currently admitted and being seen by Dr. Acie Fredrickson and Dr. Debara Pickett of our group.

## 2018-05-21 NOTE — Progress Notes (Signed)
Progress Note  Patient Name: [name] Date of Encounter: 05/21/2018  Primary Cardiologist: Kate Sable, MD  Subjective   84 year old female with a history of atrial fibrillation, congestive heart failure, coronary artery disease.  She is status post PCI. So has a history of diabetes.  She presented with an acute exacerbation of congestive heart failure.  Echocardiogram last week reveals severe LV dysfunction with an ejection fraction of 20 to 25%. Troponin levels are elevated but the trend is relatively flat.  Last troponin was 5.34. She has chronic kidney disease with a creatinine of 2.49.  I/O are even ( + 143 cc)     Inpatient Medications    Scheduled Meds: . aspirin  81 mg Oral Daily  . carvedilol  3.125 mg Oral BID WC  . furosemide  40 mg Intravenous Q12H  . insulin aspart  0-15 Units Subcutaneous TID WC  . insulin glargine  50 Units Subcutaneous BID  . ipratropium-albuterol  3 mL Nebulization BID  . mometasone-formoterol  2 puff Inhalation BID  . pantoprazole  40 mg Oral Daily  . predniSONE  40 mg Oral Q breakfast   Continuous Infusions: . heparin 1,200 Units/hr (05/20/18 2012)   PRN Meds: acetaminophen **OR** acetaminophen, ALPRAZolam, alum & mag hydroxide-simeth, ipratropium-albuterol, morphine injection, oxyCODONE, polyvinyl alcohol   Vital Signs    Vitals:   05/21/18 0350 05/21/18 0731 05/21/18 0733 05/21/18 0820  BP: (!) 121/59   130/62  Pulse: 71   71  Resp: 15     Temp: 97.6 F (36.4 C)     TempSrc: Oral     SpO2: 100% 99% 99%   Weight: 74.1 kg     Height:        Intake/Output Summary (Last 24 hours) at 05/21/2018 0902 Last data filed at 05/21/2018 3557 Gross per 24 hour  Intake 828.82 ml  Output 1700 ml  Net -871.18 ml   Filed Weights   05/19/18 0307 05/20/18 0332 05/21/18 0350  Weight: 72.8 kg 74.1 kg 74.1 kg    Telemetry    A sense, ventricular paced at 70  - Personally Reviewed  ECG       Physical Exam     GEN: Elderly female, no acute distress Neck: No JVD Cardiac: RRR, no murmurs, rubs, or gallops.  Respiratory: Clear to auscultation bilaterally. GI: Soft, nontender, non-distended  MS: No edema; No deformity. Neuro:  Nonfocal  Psych: Normal affect   Labs    Chemistry Recent Labs  Lab 05/19/18 0534 05/20/18 0322 05/21/18 0410  NA 129* 132* 134*  K 6.0* 5.2* 4.6  CL 92* 94* 97*  CO2 24 26 27   GLUCOSE 441* 301* 249*  BUN 65* 86* 90*  CREATININE 2.83* 2.97* 2.49*  CALCIUM 9.1 9.2 9.3  PROT 7.5 7.0 7.1  ALBUMIN 3.6 3.6 3.6  AST 49* 35 31  ALT 20 19 23   ALKPHOS 65 62 59  BILITOT 2.0* 1.0 0.9  GFRNONAA 15* 14* 17*  GFRAA 17* 16* 20*  ANIONGAP 13 12 10      Hematology Recent Labs  Lab 05/19/18 0534 05/20/18 0322 05/21/18 0410  WBC 4.2 7.5 8.1  RBC 3.80* 3.73* 3.73*  HGB 9.8* 9.7* 9.5*  HCT 33.3* 32.1* 32.1*  MCV 87.6 86.1 86.1  MCH 25.8* 26.0 25.5*  MCHC 29.4* 30.2 29.6*  RDW 16.2* 16.0* 15.9*  PLT 154 155 175    Cardiac Enzymes Recent Labs  Lab 05/19/18 0534 05/19/18 1038 05/19/18 1633  TROPONINI 5.68* 5.05* 5.34*  No results for input(s): TROPIPOC in the last 168 hours.   BNPNo results for input(s): BNP, PROBNP in the last 168 hours.   DDimer No results for input(s): DDIMER in the last 168 hours.   Radiology    Dg Chest 2 View  Result Date: 05/19/2018 CLINICAL DATA:  Shortness of breath for three days, former smoker, COPD, CHF, hypertension EXAM: CHEST - 2 VIEW COMPARISON:  05/18/2018 chest radiograph, PET-CT 05/17/2018 FINDINGS: LEFT subclavian pacemaker/AICD leads unchanged. Epicardial pacing leads unchanged. Enlargement of cardiac silhouette with pulmonary vascular congestion. Mediastinal contours normal. Hazy opacity RIGHT upper lobe question pneumonia, new since PET-CT. No pleural effusion or pneumothorax. Bones demineralized. IMPRESSION: Enlargement of cardiac silhouette and pulmonary vascular congestion post pacemaker/AICD. Question RIGHT  upper lobe pneumonia. Electronically Signed   By: Lavonia Dana M.D.   On: 05/19/2018 10:30   US Renal  Result Date: 05/21/2018 CLINICAL DATA:  Acute on chronic renal failure. EXAM: RENAL / URINARY TRACT ULTRASOUND COMPLETE COMPARISON:  PET CT 05/17/2018 FINDINGS: Right Kidney: Renal measurements: 9.9 x 5.0 x 6.2 cm = volume: 159.9 mL . Echogenicity within normal limits. No mass or hydronephrosis visualized. Left Kidney: Renal measurements: 9.2 x 4.8 x 4.1 cm = volume: 93.5 mL. Echogenicity within normal limits. No mass or hydronephrosis visualized. Bladder: Appears normal for degree of bladder distention. IMPRESSION: Unremarkable renal ultrasound.  No obstructive uropathy. Electronically Signed   By: Keith Rake M.D.   On: 05/21/2018 05:06   Nm Pulmonary Perf And Vent  Result Date: 05/19/2018 CLINICAL DATA:  Shortness of breath EXAM: NUCLEAR MEDICINE VENTILATION - PERFUSION LUNG SCAN TECHNIQUE: Ventilation images were obtained in multiple projections using inhaled aerosol Tc-6m DTPA. Perfusion images were obtained in multiple projections after intravenous injection of Tc-77m MAA. RADIOPHARMACEUTICALS:  31.6 mCi of Tc-27m DTPA aerosol inhalation and 4.30 mCi Tc58m MAA IV COMPARISON:  None Correlation: Chest radiograph 05/19/2018 FINDINGS: Ventilation: Enlargement of cardiac silhouette. Photopenic defect from pacemaker. Mild patchy aerosol distribution. Diminished ventilation LEFT lower lobe. Perfusion: Enlargement of cardiac silhouette. Photopenic defect from pacemaker. No segmental or subsegmental perfusion defects identified. Chest radiograph: Enlargement of cardiac silhouette and pulmonary vascular congestion post pacemaker/AICD. IMPRESSION: Very low probability for pulmonary embolism. Electronically Signed   By: Lavonia Dana M.D.   On: 05/19/2018 11:07   US Abdomen Limited Ruq  Result Date: 05/19/2018 CLINICAL DATA:  84 year old female with elevated LFTs and cirrhosis. History of  cholecystectomy. EXAM: ULTRASOUND ABDOMEN LIMITED RIGHT UPPER QUADRANT COMPARISON:  05/17/2018 PET CT, 09/29/2017 ultrasound and prior studies FINDINGS: Gallbladder: Not visualized compatible with cholecystectomy. Common bile duct: Diameter: 7 mm. No evidence of intrahepatic or extrahepatic biliary dilatation. Liver: Slightly nodular and heterogeneous liver noted without focal mass or abnormality. Portal vein is patent on color Doppler imaging with normal direction of blood flow towards the liver. IMPRESSION: 1. Slightly nodular and heterogeneous liver which would be compatible with this patient's history of cirrhosis. No focal hepatic lesions. Normal hepatopetal portal vein flow. 2. No biliary dilatation. Electronically Signed   By: Margarette Canada M.D.   On: 05/19/2018 10:24    Cardiac Studies     Patient Profile     84 y.o. female admitted with an acute exacerbation of congestive heart failure.  She has a history of atrial fibrillation and has a history of coronary artery disease.  Assessment & Plan    1.  Acute on chronic combined systolic and diastolic congestive heart failure: Patient seems to be breathing better. She received Lasix last night  and urinated 3 times over the course of the night.  It does not appear that that urine is calculated in the summary fluid balance.  She is breathing better.  He has not yet been up and around.  She will need to ambulate before she is discharged home.  She walks with a waker    She is making progress.  She has been seen by nephrology and was thought to be a relatively poor candidate for dialysis.    2.  Coronary artery disease: She is not having any episodes of angina. She has stage III/IV chronic kidney disease.  She is thought not to be a good candidate for dialysis so we will not plan heart catheterization.  3.  Paroxysmal  Atrial fibrillation : She currently is in sinus rhythm.       For questions or updates, please contact [place] Please consult [email or web address] for contact info under        Signed, Mertie Moores, MD  05/21/2018, 9:02 AM

## 2018-05-22 DIAGNOSIS — I25119 Atherosclerotic heart disease of native coronary artery with unspecified angina pectoris: Secondary | ICD-10-CM

## 2018-05-22 LAB — BASIC METABOLIC PANEL
Anion gap: 9 (ref 5–15)
BUN: 84 mg/dL — ABNORMAL HIGH (ref 8–23)
CHLORIDE: 96 mmol/L — AB (ref 98–111)
CO2: 30 mmol/L (ref 22–32)
CREATININE: 2.23 mg/dL — AB (ref 0.44–1.00)
Calcium: 9.5 mg/dL (ref 8.9–10.3)
GFR calc non Af Amer: 19 mL/min — ABNORMAL LOW (ref >60)
GFR, EST AFRICAN AMERICAN: 22 mL/min — AB (ref >60)
Glucose, Bld: 233 mg/dL — ABNORMAL HIGH (ref 70–99)
Potassium: 4.6 mmol/L (ref 3.5–5.1)
SODIUM: 135 mmol/L (ref 135–145)

## 2018-05-22 LAB — CBC WITH DIFFERENTIAL/PLATELET
ABS IMMATURE GRANULOCYTES: 0.04 10*3/uL (ref 0.00–0.07)
BASOS ABS: 0 10*3/uL (ref 0.0–0.1)
Basophils Relative: 0 %
EOS PCT: 0 %
Eosinophils Absolute: 0 10*3/uL (ref 0.0–0.5)
HEMATOCRIT: 32.7 % — AB (ref 36.0–46.0)
HEMOGLOBIN: 9.7 g/dL — AB (ref 12.0–15.0)
Immature Granulocytes: 1 %
LYMPHS ABS: 0.4 10*3/uL — AB (ref 0.7–4.0)
LYMPHS PCT: 7 %
MCH: 25.5 pg — ABNORMAL LOW (ref 26.0–34.0)
MCHC: 29.7 g/dL — AB (ref 30.0–36.0)
MCV: 86.1 fL (ref 80.0–100.0)
MONO ABS: 0.5 10*3/uL (ref 0.1–1.0)
Monocytes Relative: 8 %
NEUTROS ABS: 4.8 10*3/uL (ref 1.7–7.7)
Neutrophils Relative %: 84 %
Platelets: 166 10*3/uL (ref 150–400)
RBC: 3.8 MIL/uL — AB (ref 3.87–5.11)
RDW: 16 % — ABNORMAL HIGH (ref 11.5–15.5)
WBC: 5.7 10*3/uL (ref 4.0–10.5)
nRBC: 0 % (ref 0.0–0.2)

## 2018-05-22 LAB — GLUCOSE, CAPILLARY
GLUCOSE-CAPILLARY: 192 mg/dL — AB (ref 70–99)
Glucose-Capillary: 315 mg/dL — ABNORMAL HIGH (ref 70–99)

## 2018-05-22 LAB — MAGNESIUM: MAGNESIUM: 2.3 mg/dL (ref 1.7–2.4)

## 2018-05-22 MED ORDER — TORSEMIDE 20 MG PO TABS: 40.0000 mg | ORAL_TABLET | Freq: Two times a day (BID) | ORAL | Status: DC

## 2018-05-22 MED ORDER — MOMETASONE FURO-FORMOTEROL FUM 100-5 MCG/ACT IN AERO: 2.0000 | INHALATION_SPRAY | Freq: Two times a day (BID) | RESPIRATORY_TRACT | 0 refills | Status: AC

## 2018-05-22 MED ORDER — SENNOSIDES-DOCUSATE SODIUM 8.6-50 MG PO TABS: 1.0000 | ORAL_TABLET | Freq: Two times a day (BID) | ORAL | 0 refills | Status: AC

## 2018-05-22 NOTE — Progress Notes (Signed)
Progress Note  Patient Name: [name] Date of Encounter: 05/22/2018  Primary Cardiologist: Kate Sable, MD  Subjective   84 year old female with a history of atrial fibrillation, congestive heart failure, coronary artery disease.  She is status post PCI. So has a history of diabetes.  She presented with an acute exacerbation of congestive heart failure.  Echocardiogram last week reveals severe LV dysfunction with an ejection fraction of 20 to 25%. Creatinine is 2.23 today..  She has diuresed 2.8 L so far during this admission.    Inpatient Medications    Scheduled Meds: . aspirin  81 mg Oral Daily  . carvedilol  3.125 mg Oral BID WC  . furosemide  40 mg Intravenous Q12H  . heparin injection (subcutaneous)  5,000 Units Subcutaneous Q8H  . insulin aspart  0-15 Units Subcutaneous TID WC  . insulin aspart  5 Units Subcutaneous TID WC  . insulin glargine  60 Units Subcutaneous BID  . ipratropium-albuterol  3 mL Nebulization BID  . mometasone-formoterol  2 puff Inhalation BID  . pantoprazole  40 mg Oral Daily  . predniSONE  40 mg Oral Q breakfast  . senna-docusate  1 tablet Oral BID   Continuous Infusions:  PRN Meds: acetaminophen **OR** acetaminophen, ALPRAZolam, alum & mag hydroxide-simeth, ipratropium-albuterol, magic mouthwash w/lidocaine, morphine injection, oxyCODONE, polyvinyl alcohol   Vital Signs    Vitals:   05/22/18 0525 05/22/18 0802 05/22/18 0904 05/22/18 0906  BP: 128/62 120/75    Pulse: 64     Resp: 18     Temp: (!) 97.5 F (36.4 C)     TempSrc: Oral     SpO2: 100%  99% 99%  Weight: 73 kg     Height:        Intake/Output Summary (Last 24 hours) at 05/22/2018 1033 Last data filed at 05/22/2018 0711 Gross per 24 hour  Intake 960 ml  Output 3900 ml  Net -2940 ml   Filed Weights   05/20/18 0332 05/21/18 0350 05/22/18 0525  Weight: 74.1 kg 74.1 kg 73 kg    Telemetry    A sense, ventricular paced at 70  - Personally  Reviewed  ECG       Physical Exam   GEN: Elderly female, no acute distress Neck: No JVD Cardiac: RRR, no murmurs, rubs, or gallops.  Respiratory: Clear to auscultation bilaterally. GI: Soft, nontender, non-distended  MS: No edema; No deformity. Neuro:  Nonfocal  Psych: Normal affect   Labs    Chemistry Recent Labs  Lab 05/19/18 0534 05/20/18 0322 05/21/18 0410 05/22/18 0434  NA 129* 132* 134* 135  K 6.0* 5.2* 4.6 4.6  CL 92* 94* 97* 96*  CO2 24 26 27 30   GLUCOSE 441* 301* 249* 233*  BUN 65* 86* 90* 84*  CREATININE 2.83* 2.97* 2.49* 2.23*  CALCIUM 9.1 9.2 9.3 9.5  PROT 7.5 7.0 7.1  --   ALBUMIN 3.6 3.6 3.6  --   AST 49* 35 31  --   ALT 20 19 23   --   ALKPHOS 65 62 59  --   BILITOT 2.0* 1.0 0.9  --   GFRNONAA 15* 14* 17* 19*  GFRAA 17* 16* 20* 22*  ANIONGAP 13 12 10 9      Hematology Recent Labs  Lab 05/20/18 0322 05/21/18 0410 05/22/18 0434  WBC 7.5 8.1 5.7  RBC 3.73* 3.73* 3.80*  HGB 9.7* 9.5* 9.7*  HCT 32.1* 32.1* 32.7*  MCV 86.1 86.1 86.1  MCH 26.0 25.5* 25.5*  MCHC 30.2 29.6* 29.7*  RDW 16.0* 15.9* 16.0*  PLT 155 175 166    Cardiac Enzymes Recent Labs  Lab 05/19/18 0534 05/19/18 1038 05/19/18 1633  TROPONINI 5.68* 5.05* 5.34*   No results for input(s): TROPIPOC in the last 168 hours.   BNPNo results for input(s): BNP, PROBNP in the last 168 hours.   DDimer No results for input(s): DDIMER in the last 168 hours.   Radiology    US Renal  Result Date: 05/21/2018 CLINICAL DATA:  Acute on chronic renal failure. EXAM: RENAL / URINARY TRACT ULTRASOUND COMPLETE COMPARISON:  PET CT 05/17/2018 FINDINGS: Right Kidney: Renal measurements: 9.9 x 5.0 x 6.2 cm = volume: 159.9 mL . Echogenicity within normal limits. No mass or hydronephrosis visualized. Left Kidney: Renal measurements: 9.2 x 4.8 x 4.1 cm = volume: 93.5 mL. Echogenicity within normal limits. No mass or hydronephrosis visualized. Bladder: Appears normal for degree of bladder distention.  IMPRESSION: Unremarkable renal ultrasound.  No obstructive uropathy. Electronically Signed   By: Keith Rake M.D.   On: 05/21/2018 05:06    Cardiac Studies     Patient Profile     84 y.o. female admitted with an acute exacerbation of congestive heart failure.  She has a history of atrial fibrillation and has a history of coronary artery disease.  Assessment & Plan    1.  Acute on chronic combined systolic and diastolic congestive heart failure: Patient seems to be breathing better.   She has diuresed nicely. She can go home today. I would  discharged her on torsemide 40 mg twice a day. Kdur 20 meq a day    2.  Coronary artery disease: She is not having any episodes of angina. She has stage III/IV chronic kidney disease.  She is thought not to be a good candidate for dialysis so we will not plan heart catheterization.  3.  Paroxysmal  Atrial fibrillation : She currently is in sinus rhythm.  OK for DC today       For questions or updates, please contact [place] Please consult [email or web address] for contact info under        Signed, Mertie Moores, MD  05/22/2018, 10:33 AM

## 2018-05-22 NOTE — Care Management Note (Signed)
Case Management Note  Patient Details  Name: [name] MRN: [id number] Date of Birth: 1934-05-29  Subjective/Objective: Pt presented for SOB- hx COPD and CHF. PTA from home with family support. Has 02 via [employer]. Family to bring 02 for travel.                   Action/Plan: CM did call [facility] to see if RN can be added to services. Liaison sent e-mail to office and they can accept the referral. SOC to begin within 24-48 hours post transition home. No further needs from CM at this time.   Expected Discharge Date:  05/22/18               Expected Discharge Plan:  [place]  In-House Referral:  NA  Discharge planning Services  CM Consult  Post Acute Care Choice:  Home Health, Resumption of Svcs/PTA Provider Choice offered to:  Patient  DME Arranged:  N/A DME Agency:  NA  HH Arranged:  RN, Disease Management, PT(Liaison to call office to see if they can schedule a RN in the [place] area. ) [facility] Agency:  [place]  Status of Service:  Completed, signed off  If discussed at [place] of Stay Meetings, dates discussed:    Additional Comments:  Bethena Roys, RN 05/22/2018, 11:47 AM

## 2018-05-22 NOTE — Progress Notes (Signed)
Physical Therapy Treatment Patient Details Name: [name] MRN: [id number] DOB: 1934-04-29 Today's Date: 05/22/2018    History of Present Illness Pt is an 84 y/o female transferred from [facility] secondary to chest pain and SOB. Found to have NSTEMI, CHF exacerbation, and AKI. VQ scan showed low probability for PE. PMH includes DM, CAD s/p PCI, NSTEMI, CHF, a fib, and COPD on 2L of O2.     PT Comments    Patient received in bed, very pleasant and willing to work with therapy this morning. She did require MinA for bed mobility to bring legs over side of bed, elevate trunk,and untangle legs from blanket this morning. Otherwise able to perform transfers and gait with RW and min guard, cues for safety and sequencing, hand placement. Able to toilet and perform pericare with S, unmeasured urine occurrence marked in chart for nursing staff by PT. Able to then ambulate approximately 155f with RW and min guard in hallway, SpO2 at 94% but with two drops to 86% on 2LPM, able to recover with pursed lip breathing. She was left up in the chair with all needs met and questions/concerns addressed.     Follow Up Recommendations  Home health PT;Supervision for mobility/OOB(refusing SNF )     Equipment Recommendations  None recommended by PT    Recommendations for Other Services       Precautions / Restrictions Precautions Precautions: Fall Restrictions Weight Bearing Restrictions: No    Mobility  Bed Mobility Overal bed mobility: Needs Assistance Bed Mobility: Supine to Sit     Supine to sit: Min assist     General bed mobility comments: MinA to bring legs over side and get untangled from blankets, elevate trunk   Transfers Overall transfer level: Needs assistance Equipment used: Rolling walker (2 wheeled) Transfers: Sit to/from Stand Sit to Stand: Min guard         General transfer comment: min guard, extended time, cues for safety and sequencing for hand placement    Ambulation/Gait Ambulation/Gait assistance: Min guard Gait Distance (Feet): 150 Feet Assistive device: Rolling walker (2 wheeled) Gait Pattern/deviations: Step-through pattern;Decreased stride length Gait velocity: Decreased    General Gait Details: slow gait speed, steady with UE reliance on RW; spO2 on 2LPM for the most part 94%, did intermittently drop to 86% but able to recover on 2LPM with pursed lip breathing    Stairs             Wheelchair Mobility    Modified Rankin (Stroke Patients Only)       Balance Overall balance assessment: Needs assistance Sitting-balance support: No upper extremity supported;Feet supported Sitting balance-Leahy Scale: Good     Standing balance support: Bilateral upper extremity supported;During functional activity Standing balance-Leahy Scale: Poor Standing balance comment: Reliant on BUE support                             Cognition Arousal/Alertness: Awake/alert Behavior During Therapy: WFL for tasks assessed/performed Overall Cognitive Status: Within Functional Limits for tasks assessed                                        Exercises      General Comments        Pertinent Vitals/Pain Pain Assessment: 0-10 Pain Score: 3  Pain Location: R hip  Pain Descriptors / Indicators: Aching;Sore  Pain Intervention(s): Limited activity within patient's tolerance;Monitored during session    Home Living                      Prior Function            PT Goals (current goals can now be found in the care plan section) Acute Rehab PT Goals Patient Stated Goal: to go home  PT Goal Formulation: With patient Time For Goal Achievement: 06/04/18 Potential to Achieve Goals: Good Progress towards PT goals: Progressing toward goals    Frequency    Min 3X/week      PT Plan Current plan remains appropriate    Co-evaluation              AM-PAC PT "6 Clicks" Mobility   Outcome  Measure  Help needed turning from your back to your side while in a flat bed without using bedrails?: A Little Help needed moving from lying on your back to sitting on the side of a flat bed without using bedrails?: A Little Help needed moving to and from a bed to a chair (including a wheelchair)?: A Little Help needed standing up from a chair using your arms (e.g., wheelchair or bedside chair)?: A Little Help needed to walk in hospital room?: A Little Help needed climbing 3-5 steps with a railing? : A Lot 6 Click Score: 17    End of Session Equipment Utilized During Treatment: Oxygen Activity Tolerance: Patient tolerated treatment well Patient left: in chair;with call bell/phone within reach   PT Visit Diagnosis: Muscle weakness (generalized) (M62.81);Unsteadiness on feet (R26.81)     Time: 1252-4799 PT Time Calculation (min) (ACUTE ONLY): 27 min  Charges:  $Gait Training: 8-22 mins $Therapeutic Activity: 8-22 mins                     Deniece Ree PT, DPT, CBIS  Supplemental Physical Therapist [place]    Pager [phone] Acute Rehab Office [phone]

## 2018-05-22 NOTE — Discharge Summary (Signed)
Physician Discharge Summary  [name] GDJ:[id number] DOB: Oct 10, 1933 DOA: 05/19/2018  PCP: Loman Brooklyn, FNP  Admit date: 05/19/2018 Discharge date: 05/22/2018  Admitted From: Home Disposition:  Home  Recommendations for Outpatient Follow-up:  1. Follow up with PCP in 1 week with repeat CBC/BMP  2.  outpatient follow-up with cardiology/nephrology 3. Patient will benefit from outpatient evaluation and follow-up by palliative care 4. Follow-up in the ED if symptoms worsen or new appear   Home Health: Yes: RN/PT Equipment/Devices: None Discharge Condition: Guarded to poor  CODE STATUS: DNR Diet recommendation: Heart Healthy / Carb Modified / Regular / Dysphagia fluid restriction of up to 1200 cc a day  Brief/Interim Summary: 84 year old female with history of COPD on 2 L oxygen, chronic combined systolic and diastolic congestive heart failure, ischemic cardia myopathy status post AICD, CAD, diabetes mellitus type 2, hyperlipidemia, chronic kidney disease was transferred from [facility] for evaluation of shortness of breath and chest pain.  She was found to have elevated troponin and acute kidney injury.  Chest x-ray showed pulmonary vascular congestion.  She was started on intravenous Lasix.  Cardiology was consulted.  Nephrology was also consulted for worsening renal function.  Patient was thought to be a poor candidate for dialysis and patient also refused to be on dialysis if need be.  Cardiology recommended medical management.  Creatinine is improving, overall condition is stable for now.  She will be discharged since cardiology has cleared her for discharge.  Discharge Diagnoses:  Principal Problem:   SOB (shortness of breath) Active Problems:   Shortness of breath   Type 2 diabetes mellitus (HCC)   CAD (coronary artery disease)   Non-ST elevation (NSTEMI) myocardial infarction (HCC)   Stage III chronic kidney disease (HCC)   CHF exacerbation (HCC)   Chest  pain   Abnormal LFTs  Non-STEMI -Cardiology following.  No plans for cardiac catheterization because of renal function and patient being on not a good dialysis candidate.  Initially started on heparin drip which has been subsequently discontinued.  VQ scan was very low probability for PE -Continue aspirin, Coreg.  Echo shows worsening EF of 20 to 25% with inferior akinesis and severe global hypokinesis and grade 2 diastolic dysfunction. -Currently chest pain-free. -Cardiology has cleared the patient for discharge.  Outpatient follow-up with cardiology  Acute on chronic combined systolic and diastolic heart failure -Negative balance of 2816.2 cc since admission.  Currently on IV Lasix.    Cardiology recommends torsemide 40 milligrams twice a day on discharge.  Continue Coreg.  Outpatient follow-up with cardiology  COPD with probable mild exacerbation along with chronic hypoxic respiratory failure -Treated with oral prednisone and Dulera.  Continue Dulera on discharge.  Discontinue prednisone. -Outpatient follow-up  Acute kidney injury on chronic kidney disease stage III -Creatinine is 2.23 today. Nephrology following.  Renal ultrasound was negative for hydronephrosis. -Patient is not a dialysis candidate as per nephrology.  Patient also does not want dialysis. -Outpatient follow-up with nephrology  Diabetes mellitus type II uncontrolled with hyperglycemia with peripheral neuropathy -Continue home regimen.  Outpatient follow-up -A1c 6.9 -Continue home regimen for peripheral neuropathy.  Follow-up with PCP  Abnormal LFTs -Resolved.  Bili total has normalized.  Right upper quadrant ultrasound showed nodular and heterogeneous liver which could be compatible with the patient's history of cirrhosis.  No focal hepatic lesions and no biliary dilatation.  Outpatient follow-up  Coronary artery disease -Continue aspirin, beta-blocker.  Not on statin.  No current chest pain.  Outpatient  follow-up  Generalized deconditioning -Overall prognosis is guarded considering her worsening renal function.  Palliative care evaluation appreciated.  Patient will benefit from outpatient palliative care evaluation and follow-up -Patient will need home PT and RN  Discharge Instructions  Discharge Instructions    (HEART FAILURE PATIENTS) Call MD:  Anytime you have any of the following symptoms: 1) 3 pound weight gain in 24 hours or 5 pounds in 1 week 2) shortness of breath, with or without a dry hacking cough 3) swelling in the hands, feet or stomach 4) if you have to sleep on extra pillows at night in order to breathe.   Complete by:  As directed    Ambulatory referral to Cardiology   Complete by:  As directed    Chf exacerbation   Call MD for:  difficulty breathing, headache or visual disturbances   Complete by:  As directed    Call MD for:  extreme fatigue   Complete by:  As directed    Call MD for:  hives   Complete by:  As directed    Call MD for:  persistant dizziness or light-headedness   Complete by:  As directed    Call MD for:  persistant nausea and vomiting   Complete by:  As directed    Call MD for:  redness, tenderness, or signs of infection (pain, swelling, redness, odor or green/yellow discharge around incision site)   Complete by:  As directed    Call MD for:  temperature >100.4   Complete by:  As directed    Diet - low sodium heart healthy   Complete by:  As directed    Diet Carb Modified   Complete by:  As directed    Increase activity slowly   Complete by:  As directed      Allergies as of 05/22/2018      Reactions   Augmentin [amoxicillin-pot Clavulanate]    Abdominal pain   Metformin    Upset stomach   Metolazone    "made everything hurt" 07/03/15   Sulfonamide Derivatives    swelling      Medication List    STOP taking these medications   metolazone 2.5 MG tablet Commonly known as:  ZAROXOLYN     TAKE these medications   albuterol 0.63  MG/3ML nebulizer solution Commonly known as:  ACCUNEB Take 1 ampule by nebulization every 6 (six) hours as needed for wheezing.   allopurinol 100 MG tablet Commonly known as:  ZYLOPRIM Take 200 mg by mouth daily.   ALPRAZolam 0.25 MG tablet Commonly known as:  XANAX Take 0.25 mg by mouth at bedtime as needed for sleep.   aspirin 81 MG chewable tablet Chew 1 tablet (81 mg total) by mouth daily.   carvedilol 3.125 MG tablet Commonly known as:  COREG Take 1 tablet (3.125 mg total) by mouth 2 (two) times daily with a meal.   citalopram 20 MG tablet Commonly known as:  CELEXA Take 20 mg by mouth daily.   cyanocobalamin 2000 MCG tablet Take 2,000 mcg by mouth daily.   diclofenac sodium 1 % Gel Commonly known as:  VOLTAREN Apply 2 g topically 4 (four) times daily.   ergocalciferol 1.25 MG (50000 UT) capsule Commonly known as:  VITAMIN D2 Take 50,000 Units by mouth 2 (two) times a week.   HYDROmorphone 4 MG tablet Commonly known as:  DILAUDID Take 4 mg by mouth 2 (two) times daily as needed for severe pain.   insulin glargine 100  unit/mL Sopn Commonly known as:  LANTUS Inject 50 Units into the skin 2 (two) times daily. 50 units in the morning and 50 units in the evening   mometasone-formoterol 100-5 MCG/ACT Aero Commonly known as:  DULERA Inhale 2 puffs into the lungs 2 (two) times daily.   nitroGLYCERIN 0.4 MG SL tablet Commonly known as:  NITROSTAT Place 1 tablet (0.4 mg total) under the tongue every 5 (five) minutes as needed for chest pain.   OXYGEN Inhale 2 L into the lungs continuous.   potassium chloride SA 20 MEQ tablet Commonly known as:  K-DUR,KLOR-CON Take 1 tablet (20 mEq total) by mouth daily.   ranitidine 150 MG tablet Commonly known as:  ZANTAC Take 150 mg by mouth daily.   senna-docusate 8.6-50 MG tablet Commonly known as:  Senokot-S Take 1 tablet by mouth 2 (two) times daily.   torsemide 20 MG tablet Commonly known as:  DEMADEX Take 2  tablets (40 mg total) by mouth 2 (two) times daily. What changed:    when to take this  additional instructions   Vitamin C 500 MG Caps Take 100 mg by mouth daily.      Follow-up Information    Loman Brooklyn, FNP. Schedule an appointment as soon as possible for a visit in 1 week(s).   Specialty:  Family Medicine Why:  With CBC and BMP Contact information: [place] [id number] [phone]        Herminio Commons, MD .   Specialty:  Cardiology Contact information: [place] [id number] [phone]        Thompson Grayer, MD .   Specialty:  Cardiology Contact information: [place] [address] [phone]        Nephrologist. Schedule an appointment as soon as possible for a visit in 1 week(s).        Palliative care. Schedule an appointment as soon as possible for a visit in 1 week(s).          Allergies  Allergen Reactions  . Augmentin [Amoxicillin-Pot Clavulanate]     Abdominal pain  . Metformin     Upset stomach  . Metolazone     "made everything hurt" 07/03/15  . Sulfonamide Derivatives     swelling    Consultations:  Cardiology/nephrology/palliative care   Procedures/Studies: Dg Chest 2 View  Result Date: 05/19/2018 CLINICAL DATA:  Shortness of breath for three days, former smoker, COPD, CHF, hypertension EXAM: CHEST - 2 VIEW COMPARISON:  05/18/2018 chest radiograph, PET-CT 05/17/2018 FINDINGS: LEFT subclavian pacemaker/AICD leads unchanged. Epicardial pacing leads unchanged. Enlargement of cardiac silhouette with pulmonary vascular congestion. Mediastinal contours normal. Hazy opacity RIGHT upper lobe question pneumonia, new since PET-CT. No pleural effusion or pneumothorax. Bones demineralized. IMPRESSION: Enlargement of cardiac silhouette and pulmonary vascular congestion post pacemaker/AICD. Question RIGHT upper lobe pneumonia. Electronically Signed   By: Lavonia Dana M.D.    On: 05/19/2018 10:30   US Renal  Result Date: 05/21/2018 CLINICAL DATA:  Acute on chronic renal failure. EXAM: RENAL / URINARY TRACT ULTRASOUND COMPLETE COMPARISON:  PET CT 05/17/2018 FINDINGS: Right Kidney: Renal measurements: 9.9 x 5.0 x 6.2 cm = volume: 159.9 mL . Echogenicity within normal limits. No mass or hydronephrosis visualized. Left Kidney: Renal measurements: 9.2 x 4.8 x 4.1 cm = volume: 93.5 mL. Echogenicity within normal limits. No mass or hydronephrosis visualized. Bladder: Appears normal for degree of bladder distention. IMPRESSION: Unremarkable renal  ultrasound.  No obstructive uropathy. Electronically Signed   By: Keith Rake M.D.   On: 05/21/2018 05:06   Nm Pulmonary Perf And Vent  Result Date: 05/19/2018 CLINICAL DATA:  Shortness of breath EXAM: NUCLEAR MEDICINE VENTILATION - PERFUSION LUNG SCAN TECHNIQUE: Ventilation images were obtained in multiple projections using inhaled aerosol Tc-63m DTPA. Perfusion images were obtained in multiple projections after intravenous injection of Tc-37m MAA. RADIOPHARMACEUTICALS:  31.6 mCi of Tc-35m DTPA aerosol inhalation and 4.30 mCi Tc28m MAA IV COMPARISON:  None Correlation: Chest radiograph 05/19/2018 FINDINGS: Ventilation: Enlargement of cardiac silhouette. Photopenic defect from pacemaker. Mild patchy aerosol distribution. Diminished ventilation LEFT lower lobe. Perfusion: Enlargement of cardiac silhouette. Photopenic defect from pacemaker. No segmental or subsegmental perfusion defects identified. Chest radiograph: Enlargement of cardiac silhouette and pulmonary vascular congestion post pacemaker/AICD. IMPRESSION: Very low probability for pulmonary embolism. Electronically Signed   By: Lavonia Dana M.D.   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 EXAM: NUCLEAR MEDICINE PET SKULL BASE TO THIGH TECHNIQUE: 8.6 mCi F-18  FDG was injected intravenously. Full-ring PET imaging was performed from the skull base to thigh after the radiotracer. CT data was obtained and used for attenuation correction and anatomic localization. Fasting blood glucose: 156 mg/dl COMPARISON:  Maxillary CT 02/10/2018 FINDINGS: Mediastinal blood pool activity: SUV max 2.3 NECK: Hypermetabolic activity localizing to the RIGHT hard palate with SUV max equal 5.1 (image 22 fused data) with well as coronal image 237 fused data set No hypermetabolic cervical lymph nodes. Incidental CT findings: none CHEST: Hypermetabolic LEFT axillary lymph node which is small SUV max equal 3.2. Probably misregistration with activity posterior to a rounded lymph node measuring 10 mm (image 61/4). No additional hypermetabolic lymph nodes. Second hypermetabolic nodule in the medial aspect of the LEFT breast with SUV max equal 3.8. This nodule measures 16 mm (image 86/4). Incidental CT findings: .Large heart. LEFT-sided pacemaker bilateral small effusions. ABDOMEN/PELVIS: No abnormal hypermetabolic activity within the liver, pancreas, adrenal glands, or spleen. No hypermetabolic lymph nodes in the abdomen or pelvis. Incidental CT findings: Several round lymph nodes LEFT of the aorta without significant metabolic activity. Example lymph node LEFT of the aortic bifurcation measuring 9 mm (image 151/4) SKELETON: There is intense metabolic activity along the RIGHT SI joint with SUV max equal 6.4. There is soft tissue fullness within the inferior margin of the SI joints (image 169/4) which is hypermetabolic. This finding is similar to CT 08/16/2017. Additionally there is intense activity the facet joint posteriorly at L4-L5 with intense metabolic activity. There is soft tissue in the joint space similar to the RIGHT SI joint. This is also hypermetabolic SUV max equal 7.7 Incidental CT findings: none IMPRESSION: 1. Focal activity in the RIGHT hard palate corresponds with history of head neck  cancer. 2. No evidence of metastatic adenopathy in the neck. 3. Hypermetabolic nodule in the medial LEFT breast and hypermetabolic LEFT axial lymph node. Recommend correlation with mammogram and potentially ultrasound. 4. No evidence of distant metastasis. 5. Focal metabolic activity along the RIGHT SI joint and the facet joints posteriorly at L4-L5. There is pannus in the joint suggesting a chronic inflammatory process. Malignancy is much less favored. Electronically Signed   By: Suzy Bouchard M.D.   On: 05/17/2018 14:56   US Abdomen Limited Ruq  Result Date: 05/19/2018 CLINICAL DATA:  84 year old female with elevated LFTs and cirrhosis. History  of cholecystectomy. EXAM: ULTRASOUND ABDOMEN LIMITED RIGHT UPPER QUADRANT COMPARISON:  05/17/2018 PET CT, 09/29/2017 ultrasound and prior studies FINDINGS: Gallbladder: Not visualized compatible with cholecystectomy. Common bile duct: Diameter: 7 mm. No evidence of intrahepatic or extrahepatic biliary dilatation. Liver: Slightly nodular and heterogeneous liver noted without focal mass or abnormality. Portal vein is patent on color Doppler imaging with normal direction of blood flow towards the liver. IMPRESSION: 1. Slightly nodular and heterogeneous liver which would be compatible with this patient's history of cirrhosis. No focal hepatic lesions. Normal hepatopetal portal vein flow. 2. No biliary dilatation. Electronically Signed   By: Margarette Canada M.D.   On: 05/19/2018 10:24    Echo Study Conclusions  - Procedure narrative: Transthoracic echocardiography. Image quality was suboptimal. The study was technically difficult, as a result of poor acoustic windows, poor sound wave transmission, and body habitus. Intravenous contrast (Definity) was administered. - Left ventricle: The cavity size was moderately dilated. Wall thickness was normal. Systolic function was severely reduced. The estimated ejection fraction was in the range of 20% to  25%. Doppler parameters are consistent with pseudonormal left ventricular relaxation (grade 2 diastolic dysfunction). The E/e&' ratio is >15, suggesting elevated LV filling pressure. - Aortic valve: Valve area (VTI): 2.35 cm^2. Valve area (Vmean): 2.39 cm^2. - Mitral valve: Calcified annulus. Mildly thickened leaflets . There was mild regurgitation. - Left atrium: Severely dilated. - Right ventricle: The cavity size was mildly dilated. Mild systolic dysfunction. Pacer wire or catheter noted in right ventricle. - Right atrium: Moderately dilated. Pacer wire or catheter noted in right atrium. - Tricuspid valve: There was mild regurgitation. - Pulmonary arteries: PA peak pressure: 24 mm Hg (S) + RAP. - Systemic veins: Not visualized.  Impressions:  - Technically difficult study. Definity contrast given. Compared to a prior study in 2018, the LVEF has further declined to 20-25% with inferior akinesis and severe global hypokinesis.   Subjective: Patient seen and examined at bedside.  She feels much better and wants to go home.  No overnight fever, nausea or vomiting or chest pain.  Discharge Exam: Vitals:   05/22/18 0904 05/22/18 0906  BP:    Pulse:    Resp:    Temp:    SpO2: 99% 99%   Vitals:   05/22/18 0525 05/22/18 0802 05/22/18 0904 05/22/18 0906  BP: 128/62 120/75    Pulse: 64     Resp: 18     Temp: (!) 97.5 F (36.4 C)     TempSrc: Oral     SpO2: 100%  99% 99%  Weight: 73 kg     Height:        General: Pt is alert, awake, not in acute distress Cardiovascular: rate controlled, S1/S2 + Respiratory: bilateral decreased breath sounds at bases, scattered crackles Abdominal: Soft, NT, ND, bowel sounds + Extremities: Trace edema, no cyanosis    The results of significant diagnostics from this hospitalization (including imaging, microbiology, ancillary and laboratory) are listed below for reference.     Microbiology: No results found for  this or any previous visit (from the past 240 hour(s)).   Labs: BNP (last 3 results) Recent Labs    11/27/17 2127  BNP 4,481.8*   Basic Metabolic Panel: Recent Labs  Lab 05/19/18 0534 05/20/18 0322 05/21/18 0410 05/22/18 0434  NA 129* 132* 134* 135  K 6.0* 5.2* 4.6 4.6  CL 92* 94* 97* 96*  CO2 24 26 27 30   GLUCOSE 441* 301* 249* 233*  BUN 65* 86* 90*  84*  CREATININE 2.83* 2.97* 2.49* 2.23*  CALCIUM 9.1 9.2 9.3 9.5  MG  --  2.5* 2.4 2.3   Liver Function Tests: Recent Labs  Lab 05/19/18 0534 05/20/18 0322 05/21/18 0410  AST 49* 35 31  ALT 20 19 23   ALKPHOS 65 62 59  BILITOT 2.0* 1.0 0.9  PROT 7.5 7.0 7.1  ALBUMIN 3.6 3.6 3.6   No results for input(s): LIPASE, AMYLASE in the last 168 hours. No results for input(s): AMMONIA in the last 168 hours. CBC: Recent Labs  Lab 05/19/18 0534 05/20/18 0322 05/21/18 0410 05/22/18 0434  WBC 4.2 7.5 8.1 5.7  NEUTROABS  --  6.4 6.9 4.8  HGB 9.8* 9.7* 9.5* 9.7*  HCT 33.3* 32.1* 32.1* 32.7*  MCV 87.6 86.1 86.1 86.1  PLT 154 155 175 166   Cardiac Enzymes: Recent Labs  Lab 05/19/18 0534 05/19/18 1038 05/19/18 1633  TROPONINI 5.68* 5.05* 5.34*   BNP: Invalid input(s): POCBNP CBG: Recent Labs  Lab 05/21/18 0747 05/21/18 1121 05/21/18 1723 05/21/18 2112 05/22/18 0729  GLUCAP 195* 268* 218* 347* 192*   D-Dimer No results for input(s): DDIMER in the last 72 hours. Hgb A1c No results for input(s): HGBA1C in the last 72 hours. Lipid Profile No results for input(s): CHOL, HDL, LDLCALC, TRIG, CHOLHDL, LDLDIRECT in the last 72 hours. Thyroid function studies No results for input(s): TSH, T4TOTAL, T3FREE, THYROIDAB in the last 72 hours.  Invalid input(s): FREET3 Anemia work up No results for input(s): VITAMINB12, FOLATE, FERRITIN, TIBC, IRON, RETICCTPCT in the last 72 hours. Urinalysis    Component Value Date/Time   COLORURINE STRAW (A) 05/21/2018 0359   APPEARANCEUR CLEAR 05/21/2018 0359   LABSPEC 1.010  05/21/2018 0359   PHURINE 5.0 05/21/2018 0359   GLUCOSEU 50 (A) 05/21/2018 0359   HGBUR NEGATIVE 05/21/2018 0359   BILIRUBINUR NEGATIVE 05/21/2018 0359   KETONESUR NEGATIVE 05/21/2018 0359   PROTEINUR NEGATIVE 05/21/2018 0359   NITRITE NEGATIVE 05/21/2018 0359   LEUKOCYTESUR NEGATIVE 05/21/2018 0359   Sepsis Labs Invalid input(s): PROCALCITONIN,  WBC,  LACTICIDVEN Microbiology No results found for this or any previous visit (from the past 240 hour(s)).   Time coordinating discharge: 35 minutes  SIGNED:   Aline August, MD  [facility] 05/22/2018, 10:55 AM Pager: [phone]  If 7PM-7AM, please contact night-coverage [email or web address] Password [email or web address]

## 2018-05-25 ENCOUNTER — Ambulatory Visit: Payer: Medicare Other | Admitting: Cardiology

## 2018-05-28 ENCOUNTER — Ambulatory Visit (INDEPENDENT_AMBULATORY_CARE_PROVIDER_SITE_OTHER): Payer: Medicare Other | Admitting: Cardiovascular Disease

## 2018-05-28 ENCOUNTER — Encounter: Payer: Self-pay | Admitting: Cardiovascular Disease

## 2018-05-28 VITALS — BP 118/70 | HR 67 | Ht 65.0 in | Wt 159.6 lb

## 2018-05-28 DIAGNOSIS — I25118 Atherosclerotic heart disease of native coronary artery with other forms of angina pectoris: Secondary | ICD-10-CM

## 2018-05-28 DIAGNOSIS — N183 Chronic kidney disease, stage 3 unspecified: Secondary | ICD-10-CM

## 2018-05-28 DIAGNOSIS — E785 Hyperlipidemia, unspecified: Secondary | ICD-10-CM

## 2018-05-28 DIAGNOSIS — I5042 Chronic combined systolic (congestive) and diastolic (congestive) heart failure: Secondary | ICD-10-CM | POA: Diagnosis not present

## 2018-05-28 DIAGNOSIS — Z9289 Personal history of other medical treatment: Secondary | ICD-10-CM

## 2018-05-28 DIAGNOSIS — Z95810 Presence of automatic (implantable) cardiac defibrillator: Secondary | ICD-10-CM

## 2018-05-28 NOTE — Progress Notes (Signed)
SUBJECTIVE: The patient presents for follow-up after recently being hospitalized for acute on chronic combined systolic and diastolic heart failure.  Echocardiogram 05/19/2018 demonstrated severely reduced left ventricular systolic function, LVEF 20 to 25%, with inferior akinesis and severe global hypokinesis.  There was moderate left ventricular dilatation and grade 2 diastolic dysfunction with elevated left ventricular filling pressures.  There was mild mitral and tricuspid regurgitation.  LVEF had been 45% on 03/01/2017.  She was evaluated by nephrology and deemed a poor candidate for dialysis. She also had a non-STEMI which was medically managed.  VQ scan was low probability for pulmonary embolism.  Due to renal dysfunction she is a poor candidate for cardiac catheterization.  She was discharged on torsemide 40 mg twice daily.  Discharge weight 73 kg on 05/22/2018.  Weight today 72.4 kg.  I have made numerous attempts to get her evaluated at the advanced heart failure clinic in [place] but she has consistently refusedin spite of being offered transportation by her great niece and other relatives.  She is here with her friend, Peter Congo, who used to work for Dr. Scotty Court for 32 years.  Her breathing is stable.  She denies having any leg swelling at present.  She has had some chest pain which she feels is due to her defibrillator as it originates from the same site.  She denies palpitations, orthopnea, and paroxysmal nocturnal dyspnea.   Review of Systems: As per "subjective", otherwise negative.  Allergies  Allergen Reactions  . Augmentin [Amoxicillin-Pot Clavulanate]     Abdominal pain  . Metformin     Upset stomach  . Metolazone     "made everything hurt" 07/03/15  . Sulfonamide Derivatives     swelling    Current Outpatient Medications  Medication Sig Dispense Refill  . albuterol (ACCUNEB) 0.63 MG/3ML nebulizer solution Take 1 ampule by nebulization every 6 (six) hours  as needed for wheezing.    Marland Kitchen allopurinol (ZYLOPRIM) 100 MG tablet Take 200 mg by mouth daily.     Marland Kitchen ALPRAZolam (XANAX) 0.25 MG tablet Take 0.25 mg by mouth at bedtime as needed for sleep.    . Ascorbic Acid (VITAMIN C) 500 MG CAPS Take 100 mg by mouth daily.     Marland Kitchen aspirin 81 MG chewable tablet Chew 1 tablet (81 mg total) by mouth daily.    . carvedilol (COREG) 3.125 MG tablet Take 1 tablet (3.125 mg total) by mouth 2 (two) times daily with a meal. 180 tablet 3  . citalopram (CELEXA) 20 MG tablet Take 20 mg by mouth daily.     . cyanocobalamin 2000 MCG tablet Take 2,000 mcg by mouth daily.    . diclofenac sodium (VOLTAREN) 1 % GEL Apply 2 g topically 4 (four) times daily.    . ergocalciferol (VITAMIN D2) 50000 units capsule Take 50,000 Units by mouth 2 (two) times a week.    Marland Kitchen HYDROmorphone (DILAUDID) 2 MG tablet Take 2 mg by mouth 2 (two) times daily.    . insulin glargine (LANTUS) 100 unit/mL SOPN Inject 50 Units into the skin 2 (two) times daily. 50 units in the morning and 50 units in the evening    . mometasone-formoterol (DULERA) 100-5 MCG/ACT AERO Inhale 2 puffs into the lungs 2 (two) times daily. 1 Inhaler 0  . nitroGLYCERIN (NITROSTAT) 0.4 MG SL tablet Place 1 tablet (0.4 mg total) under the tongue every 5 (five) minutes as needed for chest pain. 25 tablet 3  . OXYGEN Inhale 2  L into the lungs continuous.    . potassium chloride SA (K-DUR,KLOR-CON) 20 MEQ tablet Take 1 tablet (20 mEq total) by mouth daily. 30 tablet 6  . ranitidine (ZANTAC) 150 MG tablet Take 150 mg by mouth daily.     Marland Kitchen senna-docusate (SENOKOT-S) 8.6-50 MG tablet Take 1 tablet by mouth 2 (two) times daily. 20 tablet 0  . torsemide (DEMADEX) 20 MG tablet Take 2 tablets (40 mg total) by mouth 2 (two) times daily.     No current facility-administered medications for this visit.     Past Medical History:  Diagnosis Date  . Aneurysm ([place])    Apical; previously on Coumadin  . Anxiety disorder   . Cancer (HCC)    Skin    . Chronic renal insufficiency    Creatinine 1.17 January 2010  . Coronary artery disease    Cath 02/15/12 revealed RCA as culprit lesion w/ occlusion at site of prior stent, patent LAD stent, chronic LCx and Diagonal dz; Medical therapy   . Depression   . GERD (gastroesophageal reflux disease)   . Hyperlipidemia    H/o GI intolerance to Lipitor  . Impingement syndrome of left shoulder   . Ischemic cardiomyopathy    s/p Medtronic BiV ICD 2006  . Peripheral polyneuropathy    Refractory to several medications  . Systolic and diastolic CHF, chronic (HCC)    echo 03/2012 EF 14-43%, mod diastolic dysfunction, mod MR, mod LAE, mod-severe TR, PASP 47mmHg  . Type 2 diabetes mellitus ([place])     Past Surgical History:  Procedure Laterality Date  . ABDOMINAL HYSTERECTOMY    . CARDIAC DEFIBRILLATOR PLACEMENT  05/28/2009   Medtronic ICD placed by Dr. Caryl Comes secondary to ICM/CHF, [phone] Fidelis lead fracture with ICD storm 2010 requiring new RV lead placement  . CHOLECYSTECTOMY    . EYE SURGERY    . IMPLANTABLE CARDIOVERTER DEFIBRILLATOR GENERATOR CHANGE  09/26/13   BiV ICD generator change by Dr Rayann Heman - MDT Pershing Proud XT CRT-D  . IMPLANTABLE CARDIOVERTER DEFIBRILLATOR GENERATOR CHANGE N/A 09/26/2013   Procedure: IMPLANTABLE CARDIOVERTER DEFIBRILLATOR GENERATOR CHANGE;  Surgeon: Coralyn Mark, MD;  Location: [facility] CATH LAB;  Service: Cardiovascular;  Laterality: N/A;  . LEFT HEART CATHETERIZATION WITH CORONARY ANGIOGRAM N/A 02/15/2012   Procedure: LEFT HEART CATHETERIZATION WITH CORONARY ANGIOGRAM;  Surgeon: Peter M Martinique, MD;  Location: [facility] CATH LAB;  Service: Cardiovascular;  Laterality: N/A;  . RIGHT HEART CATHETERIZATION N/A 04/27/2012   Procedure: RIGHT HEART CATH;  Surgeon: Larey Dresser, MD;  Location: [facility] CATH LAB;  Service: Cardiovascular;  Laterality: N/A;    Social History   Socioeconomic History  . Marital status: Widowed    Spouse name: Not on file  . Number of children: Not on file  . Years of  education: Not on file  . Highest education level: Not on file  Occupational History  . Occupation: Retired    Fish farm manager: RETIRED  Social Needs  . Financial resource strain: Not on file  . Food insecurity:    Worry: Not on file    Inability: Not on file  . Transportation needs:    Medical: Not on file    Non-medical: Not on file  Tobacco Use  . Smoking status: Former Smoker    Packs/day: 0.80    Years: 30.00    Pack years: 24.00    Types: Cigarettes    Start date: 04/05/1957    Last attempt to quit: 06/27/2002    Years since quitting: 15.9  .  Smokeless tobacco: Never Used  Substance and Sexual Activity  . Alcohol use: No    Alcohol/week: 0.0 standard drinks  . Drug use: No  . Sexual activity: Not Currently  Lifestyle  . Physical activity:    Days per week: Not on file    Minutes per session: Not on file  . Stress: Not on file  Relationships  . Social connections:    Talks on phone: Not on file    Gets together: Not on file    Attends religious service: Not on file    Active member of club or organization: Not on file    Attends meetings of clubs or organizations: Not on file    Relationship status: Not on file  . Intimate partner violence:    Fear of current or ex partner: Not on file    Emotionally abused: Not on file    Physically abused: Not on file    Forced sexual activity: Not on file  Other Topics Concern  . Not on file  Social History Narrative  . Not on file     Vitals:   05/28/18 1300  BP: 118/70  Pulse: 67  SpO2: 100%  Weight: 159 lb 9.6 oz (72.4 kg)  Height: 5\' 5"  (1.651 m)    Wt Readings from Last 3 Encounters:  05/28/18 159 lb 9.6 oz (72.4 kg)  05/22/18 160 lb 14.4 oz (73 kg)  03/14/18 166 lb (75.3 kg)     PHYSICAL EXAM General: NAD, chronically ill-appearing HEENT: Normal. Neck: No JVD, no thyromegaly. Lungs: Diminished sounds throughout without crackles or wheezes. CV: Regular rate and rhythm, normal S1/S2, no S3/S4, no murmur. No  pretibial or periankle edema.   Abdomen: Soft, nontender, no distention.  Neurologic: Alert and oriented.  Psych: Normal affect. Skin: Normal. Musculoskeletal: No gross deformities.    ECG: Reviewed above under Subjective   Labs:    Lipids: Lab Results  Component Value Date/Time   LDLCALC 68 06/06/2015 05:03 AM   CHOL 146 06/06/2015 05:03 AM   TRIG 289 (H) 06/06/2015 05:03 AM   HDL 20 (L) 06/06/2015 05:03 AM       ASSESSMENT AND PLAN: 1.  Chronic combined systolic and diastolic heart failure: Symptomatically stable.  LVEF is severely depressed, 20 to 25%. Discharge weight 73 kg on 05/22/2018.  Weight today 72.4 kg.  Continue torsemide 40 mg twice daily.  2.  Coronary artery disease: Recent non-STEMI with troponins peaking at 5.68.  Symptomatically stable.  Continue ASA and Coreg.She has undergone prior stenting with her most recent coronary angiogram performed in August 2013 which showed left main 40%, LAD 50% ostial with patent stent in prox LAD, D2 90%, LCX occluded in mid vessel with left to left collaterals, RCA occluded proximally at prior stent site.  Given renal dysfunction, I will manage medically.  3. CRT-D: Due to RV lead failure and the patient's preference for no further EP procedures, ICD therapies have been turned off by Dr. Rayann Heman.  Follows up with him on 07/27/2018.  4. Hyperlipidemia: Has not wanted to try alternative statin therapy.  5.  Chronic kidney disease stage III/IV: Most recent BUN and creatinine 84 and 2.23 respectively on 05/22/2018.  Followed by nephrology.   Disposition: Follow up 3 months   Kate Sable, M.D., F.A.C.C.

## 2018-05-28 NOTE — Patient Instructions (Addendum)
Medication Instructions:  Continue all current medications.  Labwork: none  Testing/Procedures: none  Follow-Up: 3 months   Any Other Special Instructions Will Be Listed Below (If Applicable).  If you need a refill on your cardiac medications before your next appointment, please call your pharmacy.  

## 2018-06-07 ENCOUNTER — Encounter

## 2018-06-07 ENCOUNTER — Ambulatory Visit: Payer: Medicare Other | Admitting: Student

## 2018-06-08 ENCOUNTER — Telehealth: Payer: Self-pay | Admitting: Internal Medicine

## 2018-06-08 NOTE — Telephone Encounter (Signed)
Patient calling about three pounds over the last week. Taking four fluid pills a day

## 2018-06-08 NOTE — Telephone Encounter (Signed)
Patient notified and verbalized understanding. 

## 2018-06-08 NOTE — Telephone Encounter (Signed)
No c/o chest pain, dizziness.  SOB is about the same.  Weight yesterday & today was 160 lb.    Gradually gone up from 157 to 160 today.  Currently taking Torsemide 20mg  - 2 tabs twice a day.

## 2018-06-08 NOTE — Telephone Encounter (Signed)
Take metolazone 2.5 mg once.

## 2018-06-12 ENCOUNTER — Telehealth: Payer: Self-pay | Admitting: Cardiovascular Disease

## 2018-06-12 NOTE — Telephone Encounter (Signed)
Spoke w/ pt and instructed her to send a remote transmission. She stated that she would do it after 5 PM and is aware that someone will review the information tomorrow. Pt was concerned about the scales that talk to her. I instructed her that we do no follow the scales she will have to call her insurance company about that.

## 2018-06-12 NOTE — Telephone Encounter (Signed)
Pt says weight is up 167lbs today - stopped taking torsemide says she thought she was only supposed to take metolazone but as per 12/13 phone note looks like metolazone was in addition to torsemide - said she has been taking metolazone every other day and stopped taking torsemide as of 12/13 - has appt with oncologist tomorrow - routed to provider

## 2018-06-12 NOTE — Telephone Encounter (Signed)
Yes, metolazone was in addition to twice daily torsemide.  She should resume twice daily torsemide and see if this brings down weight.

## 2018-06-12 NOTE — Telephone Encounter (Signed)
Pt voiced understanding - also requesting that nurse from device clinic call her as well - will forward

## 2018-06-12 NOTE — Telephone Encounter (Signed)
Patient called stating that she has gained up to 7 lbs of fluid.

## 2018-06-13 NOTE — Telephone Encounter (Signed)
Explained to pt that the scales are not managed by [facility] even though Sharman Cheek (ICM RN) would ask about pts weight, that the scales did not report back to this clinic the scales, Also explained to pt that Sharman Cheek was monitored the fluid level based on her ICD in her chest and that one of her leads is not working properly in her and therefore we could not rely on the Optivol feature on her ICD, explained to pt that she needed to be taking the torsemide 40mg  twice daily and since she had not been taking that and only taking the metolazone then it could take a few days to get there weight back done to normal. Pt voiced understanding

## 2018-06-14 LAB — CUP PACEART REMOTE DEVICE CHECK
Battery Remaining Longevity: 13 mo
Brady Statistic AP VP Percent: 2.87 %
Brady Statistic AP VS Percent: 0.02 %
Brady Statistic AS VP Percent: 96.86 %
Date Time Interrogation Session: 20191017083622
HIGH POWER IMPEDANCE MEASURED VALUE: 43 Ohm
HIGH POWER IMPEDANCE MEASURED VALUE: 53 Ohm
Implantable Lead Implant Date: 20060424
Implantable Lead Implant Date: 20060424
Implantable Lead Location: 753858
Implantable Lead Location: 753860
Implantable Lead Model: 7120
Implantable Pulse Generator Implant Date: 20150402
Lead Channel Impedance Value: 247 Ohm
Lead Channel Impedance Value: 380 Ohm
Lead Channel Impedance Value: 4047 Ohm
Lead Channel Impedance Value: 4047 Ohm
Lead Channel Impedance Value: 437 Ohm
Lead Channel Impedance Value: 665 Ohm
Lead Channel Pacing Threshold Amplitude: 0.75 V
Lead Channel Pacing Threshold Amplitude: 2.5 V
Lead Channel Pacing Threshold Pulse Width: 0.4 ms
Lead Channel Pacing Threshold Pulse Width: 1 ms
Lead Channel Sensing Intrinsic Amplitude: 1.875 mV
Lead Channel Setting Pacing Amplitude: 0.5 V
Lead Channel Setting Pacing Amplitude: 2 V
Lead Channel Setting Pacing Pulse Width: 0.03 ms
Lead Channel Setting Pacing Pulse Width: 1 ms
MDC IDC LEAD IMPLANT DT: 20101202
MDC IDC LEAD LOCATION: 753859
MDC IDC MSMT BATTERY VOLTAGE: 2.89 V
MDC IDC MSMT LEADCHNL RA SENSING INTR AMPL: 0.75 mV
MDC IDC MSMT LEADCHNL RA SENSING INTR AMPL: 0.75 mV
MDC IDC MSMT LEADCHNL RV PACING THRESHOLD AMPLITUDE: 2.375 V
MDC IDC MSMT LEADCHNL RV PACING THRESHOLD PULSEWIDTH: 0.4 ms
MDC IDC MSMT LEADCHNL RV SENSING INTR AMPL: 1.875 mV
MDC IDC SET LEADCHNL LV PACING AMPLITUDE: 4 V
MDC IDC SET LEADCHNL RV SENSING SENSITIVITY: 0.3 mV
MDC IDC STAT BRADY AS VS PERCENT: 0.25 %
MDC IDC STAT BRADY RA PERCENT PACED: 2.86 %
MDC IDC STAT BRADY RV PERCENT PACED: 98.86 %

## 2018-07-12 ENCOUNTER — Ambulatory Visit (INDEPENDENT_AMBULATORY_CARE_PROVIDER_SITE_OTHER): Payer: Medicare Other

## 2018-07-12 DIAGNOSIS — I255 Ischemic cardiomyopathy: Secondary | ICD-10-CM | POA: Diagnosis not present

## 2018-07-13 NOTE — Progress Notes (Signed)
Remote ICD transmission.   

## 2018-07-14 LAB — CUP PACEART REMOTE DEVICE CHECK
Battery Remaining Longevity: 15 mo
Brady Statistic AP VP Percent: 1.93 %
Brady Statistic RA Percent Paced: 1.94 %
Brady Statistic RV Percent Paced: 98.64 %
HighPow Impedance: 48 Ohm
HighPow Impedance: 59 Ohm
Implantable Lead Implant Date: 20060424
Implantable Lead Location: 753858
Implantable Lead Location: 753859
Implantable Lead Model: 4194
Implantable Lead Model: 7120
Lead Channel Impedance Value: 266 Ohm
Lead Channel Impedance Value: 380 Ohm
Lead Channel Impedance Value: 4047 Ohm
Lead Channel Impedance Value: 4047 Ohm
Lead Channel Pacing Threshold Amplitude: 0.875 V
Lead Channel Pacing Threshold Amplitude: 2.375 V
Lead Channel Pacing Threshold Amplitude: 2.5 V
Lead Channel Sensing Intrinsic Amplitude: 0.875 mV
Lead Channel Sensing Intrinsic Amplitude: 0.875 mV
Lead Channel Sensing Intrinsic Amplitude: 1.625 mV
Lead Channel Setting Pacing Amplitude: 0.5 V
Lead Channel Setting Pacing Amplitude: 3.5 V
Lead Channel Setting Pacing Pulse Width: 0.03 ms
Lead Channel Setting Sensing Sensitivity: 0.3 mV
MDC IDC LEAD IMPLANT DT: 20060424
MDC IDC LEAD IMPLANT DT: 20101202
MDC IDC LEAD LOCATION: 753860
MDC IDC MSMT BATTERY VOLTAGE: 2.88 V
MDC IDC MSMT LEADCHNL LV IMPEDANCE VALUE: 722 Ohm
MDC IDC MSMT LEADCHNL LV PACING THRESHOLD PULSEWIDTH: 1 ms
MDC IDC MSMT LEADCHNL RA IMPEDANCE VALUE: 456 Ohm
MDC IDC MSMT LEADCHNL RA PACING THRESHOLD PULSEWIDTH: 0.4 ms
MDC IDC MSMT LEADCHNL RV PACING THRESHOLD PULSEWIDTH: 0.4 ms
MDC IDC MSMT LEADCHNL RV SENSING INTR AMPL: 1.625 mV
MDC IDC PG IMPLANT DT: 20150402
MDC IDC SESS DTM: 20200116093823
MDC IDC SET LEADCHNL LV PACING PULSEWIDTH: 1 ms
MDC IDC SET LEADCHNL RA PACING AMPLITUDE: 2 V
MDC IDC STAT BRADY AP VS PERCENT: 0.02 %
MDC IDC STAT BRADY AS VP PERCENT: 97.8 %
MDC IDC STAT BRADY AS VS PERCENT: 0.24 %

## 2018-07-24 ENCOUNTER — Telehealth: Payer: Self-pay | Admitting: Cardiovascular Disease

## 2018-07-24 NOTE — Telephone Encounter (Signed)
Per Caregiver Peter Congo) called stating that patient has been placed under the care of Hospice.

## 2018-07-25 NOTE — Telephone Encounter (Signed)
Fwd to provider for notification.

## 2018-07-25 NOTE — Telephone Encounter (Signed)
Thank you for letting me know

## 2018-07-27 ENCOUNTER — Encounter: Payer: Medicare Other | Admitting: Internal Medicine

## 2018-08-03 ENCOUNTER — Encounter: Payer: Medicare Other | Admitting: Internal Medicine

## 2018-08-23 ENCOUNTER — Ambulatory Visit: Payer: Medicare Other | Admitting: Cardiovascular Disease

## 2018-09-05 ENCOUNTER — Other Ambulatory Visit: Payer: Self-pay

## 2018-09-05 ENCOUNTER — Emergency Department (HOSPITAL_COMMUNITY): Payer: Medicare Other

## 2018-09-05 ENCOUNTER — Encounter (HOSPITAL_COMMUNITY): Payer: Self-pay

## 2018-09-05 ENCOUNTER — Inpatient Hospital Stay (HOSPITAL_COMMUNITY)
Admission: EM | Admit: 2018-09-05 | Discharge: 2018-09-11 | DRG: 683 | Disposition: A | Discharge status: Skilled Nursing Facility | Payer: Medicare Other | Source: Skilled Nursing Facility | Attending: Internal Medicine | Admitting: Internal Medicine

## 2018-09-05 DIAGNOSIS — N179 Acute kidney failure, unspecified: Principal | ICD-10-CM

## 2018-09-05 DIAGNOSIS — I5022 Chronic systolic (congestive) heart failure: Secondary | ICD-10-CM | POA: Diagnosis not present

## 2018-09-05 DIAGNOSIS — E1122 Type 2 diabetes mellitus with diabetic chronic kidney disease: Secondary | ICD-10-CM | POA: Diagnosis present

## 2018-09-05 DIAGNOSIS — K219 Gastro-esophageal reflux disease without esophagitis: Secondary | ICD-10-CM | POA: Diagnosis present

## 2018-09-05 DIAGNOSIS — Z9981 Dependence on supplemental oxygen: Secondary | ICD-10-CM

## 2018-09-05 DIAGNOSIS — D631 Anemia in chronic kidney disease: Secondary | ICD-10-CM | POA: Diagnosis present

## 2018-09-05 DIAGNOSIS — G8929 Other chronic pain: Secondary | ICD-10-CM | POA: Diagnosis present

## 2018-09-05 DIAGNOSIS — I5042 Chronic combined systolic (congestive) and diastolic (congestive) heart failure: Secondary | ICD-10-CM | POA: Diagnosis present

## 2018-09-05 DIAGNOSIS — Z888 Allergy status to other drugs, medicaments and biological substances status: Secondary | ICD-10-CM

## 2018-09-05 DIAGNOSIS — K5900 Constipation, unspecified: Secondary | ICD-10-CM | POA: Diagnosis present

## 2018-09-05 DIAGNOSIS — R627 Adult failure to thrive: Secondary | ICD-10-CM | POA: Diagnosis present

## 2018-09-05 DIAGNOSIS — F329 Major depressive disorder, single episode, unspecified: Secondary | ICD-10-CM | POA: Diagnosis present

## 2018-09-05 DIAGNOSIS — G629 Polyneuropathy, unspecified: Secondary | ICD-10-CM | POA: Diagnosis present

## 2018-09-05 DIAGNOSIS — Z79899 Other long term (current) drug therapy: Secondary | ICD-10-CM

## 2018-09-05 DIAGNOSIS — Z515 Encounter for palliative care: Secondary | ICD-10-CM

## 2018-09-05 DIAGNOSIS — N189 Chronic kidney disease, unspecified: Secondary | ICD-10-CM

## 2018-09-05 DIAGNOSIS — J9610 Chronic respiratory failure, unspecified whether with hypoxia or hypercapnia: Secondary | ICD-10-CM | POA: Diagnosis present

## 2018-09-05 DIAGNOSIS — Z90710 Acquired absence of both cervix and uterus: Secondary | ICD-10-CM

## 2018-09-05 DIAGNOSIS — R531 Weakness: Secondary | ICD-10-CM | POA: Diagnosis present

## 2018-09-05 DIAGNOSIS — I252 Old myocardial infarction: Secondary | ICD-10-CM

## 2018-09-05 DIAGNOSIS — N183 Chronic kidney disease, stage 3 (moderate): Secondary | ICD-10-CM | POA: Diagnosis present

## 2018-09-05 DIAGNOSIS — Z7982 Long term (current) use of aspirin: Secondary | ICD-10-CM

## 2018-09-05 DIAGNOSIS — Z85828 Personal history of other malignant neoplasm of skin: Secondary | ICD-10-CM | POA: Diagnosis not present

## 2018-09-05 DIAGNOSIS — Z955 Presence of coronary angioplasty implant and graft: Secondary | ICD-10-CM

## 2018-09-05 DIAGNOSIS — R778 Other specified abnormalities of plasma proteins: Secondary | ICD-10-CM

## 2018-09-05 DIAGNOSIS — E11649 Type 2 diabetes mellitus with hypoglycemia without coma: Secondary | ICD-10-CM | POA: Diagnosis present

## 2018-09-05 DIAGNOSIS — E785 Hyperlipidemia, unspecified: Secondary | ICD-10-CM | POA: Diagnosis present

## 2018-09-05 DIAGNOSIS — I2510 Atherosclerotic heart disease of native coronary artery without angina pectoris: Secondary | ICD-10-CM | POA: Diagnosis present

## 2018-09-05 DIAGNOSIS — R5381 Other malaise: Secondary | ICD-10-CM | POA: Diagnosis present

## 2018-09-05 DIAGNOSIS — R7989 Other specified abnormal findings of blood chemistry: Secondary | ICD-10-CM | POA: Diagnosis not present

## 2018-09-05 DIAGNOSIS — Z66 Do not resuscitate: Secondary | ICD-10-CM | POA: Diagnosis present

## 2018-09-05 DIAGNOSIS — Z95810 Presence of automatic (implantable) cardiac defibrillator: Secondary | ICD-10-CM | POA: Diagnosis not present

## 2018-09-05 DIAGNOSIS — N170 Acute kidney failure with tubular necrosis: Secondary | ICD-10-CM | POA: Diagnosis not present

## 2018-09-05 DIAGNOSIS — Z882 Allergy status to sulfonamides status: Secondary | ICD-10-CM | POA: Diagnosis not present

## 2018-09-05 DIAGNOSIS — N184 Chronic kidney disease, stage 4 (severe): Secondary | ICD-10-CM | POA: Diagnosis not present

## 2018-09-05 DIAGNOSIS — E1142 Type 2 diabetes mellitus with diabetic polyneuropathy: Secondary | ICD-10-CM | POA: Diagnosis present

## 2018-09-05 DIAGNOSIS — N289 Disorder of kidney and ureter, unspecified: Secondary | ICD-10-CM

## 2018-09-05 DIAGNOSIS — I255 Ischemic cardiomyopathy: Secondary | ICD-10-CM | POA: Diagnosis present

## 2018-09-05 DIAGNOSIS — E1151 Type 2 diabetes mellitus with diabetic peripheral angiopathy without gangrene: Secondary | ICD-10-CM | POA: Diagnosis present

## 2018-09-05 DIAGNOSIS — Z79891 Long term (current) use of opiate analgesic: Secondary | ICD-10-CM

## 2018-09-05 DIAGNOSIS — Z87891 Personal history of nicotine dependence: Secondary | ICD-10-CM

## 2018-09-05 DIAGNOSIS — N139 Obstructive and reflux uropathy, unspecified: Secondary | ICD-10-CM | POA: Diagnosis present

## 2018-09-05 DIAGNOSIS — I130 Hypertensive heart and chronic kidney disease with heart failure and stage 1 through stage 4 chronic kidney disease, or unspecified chronic kidney disease: Secondary | ICD-10-CM | POA: Diagnosis present

## 2018-09-05 DIAGNOSIS — Z7189 Other specified counseling: Secondary | ICD-10-CM | POA: Diagnosis not present

## 2018-09-05 DIAGNOSIS — Z85818 Personal history of malignant neoplasm of other sites of lip, oral cavity, and pharynx: Secondary | ICD-10-CM

## 2018-09-05 DIAGNOSIS — Z794 Long term (current) use of insulin: Secondary | ICD-10-CM

## 2018-09-05 DIAGNOSIS — E119 Type 2 diabetes mellitus without complications: Secondary | ICD-10-CM

## 2018-09-05 DIAGNOSIS — F419 Anxiety disorder, unspecified: Secondary | ICD-10-CM | POA: Diagnosis present

## 2018-09-05 DIAGNOSIS — Z8249 Family history of ischemic heart disease and other diseases of the circulatory system: Secondary | ICD-10-CM

## 2018-09-05 DIAGNOSIS — Z9049 Acquired absence of other specified parts of digestive tract: Secondary | ICD-10-CM

## 2018-09-05 DIAGNOSIS — Z6831 Body mass index (BMI) 31.0-31.9, adult: Secondary | ICD-10-CM

## 2018-09-05 HISTORY — DX: Breakdown (mechanical) of other cardiac electronic device, initial encounter: T82.118A

## 2018-09-05 HISTORY — DX: Other lesions of oral mucosa: K13.79

## 2018-09-05 HISTORY — DX: Anemia, unspecified: D64.9

## 2018-09-05 HISTORY — DX: Chronic combined systolic (congestive) and diastolic (congestive) heart failure: I50.42

## 2018-09-05 HISTORY — DX: Chronic kidney disease, stage 4 (severe): N18.4

## 2018-09-05 LAB — CBC WITH DIFFERENTIAL/PLATELET
Abs Immature Granulocytes: 0.04 10*3/uL (ref 0.00–0.07)
Basophils Absolute: 0 10*3/uL (ref 0.0–0.1)
Basophils Relative: 1 %
EOS ABS: 0.3 10*3/uL (ref 0.0–0.5)
EOS PCT: 4 %
HCT: 32.2 % — ABNORMAL LOW (ref 36.0–46.0)
Hemoglobin: 9 g/dL — ABNORMAL LOW (ref 12.0–15.0)
Immature Granulocytes: 1 %
Lymphocytes Relative: 11 %
Lymphs Abs: 0.8 10*3/uL (ref 0.7–4.0)
MCH: 23.9 pg — ABNORMAL LOW (ref 26.0–34.0)
MCHC: 28 g/dL — ABNORMAL LOW (ref 30.0–36.0)
MCV: 85.6 fL (ref 80.0–100.0)
Monocytes Absolute: 0.8 10*3/uL (ref 0.1–1.0)
Monocytes Relative: 11 %
Neutro Abs: 5.1 10*3/uL (ref 1.7–7.7)
Neutrophils Relative %: 72 %
PLATELETS: 163 10*3/uL (ref 150–400)
RBC: 3.76 MIL/uL — ABNORMAL LOW (ref 3.87–5.11)
RDW: 18.7 % — AB (ref 11.5–15.5)
WBC: 7.1 10*3/uL (ref 4.0–10.5)
nRBC: 0 % (ref 0.0–0.2)

## 2018-09-05 LAB — COMPREHENSIVE METABOLIC PANEL
ALT: 22 U/L (ref 0–44)
AST: 33 U/L (ref 15–41)
Albumin: 3.1 g/dL — ABNORMAL LOW (ref 3.5–5.0)
Alkaline Phosphatase: 109 U/L (ref 38–126)
Anion gap: 13 (ref 5–15)
BUN: 80 mg/dL — ABNORMAL HIGH (ref 8–23)
CALCIUM: 8.4 mg/dL — AB (ref 8.9–10.3)
CO2: 22 mmol/L (ref 22–32)
Chloride: 98 mmol/L (ref 98–111)
Creatinine, Ser: 3.19 mg/dL — ABNORMAL HIGH (ref 0.44–1.00)
GFR calc Af Amer: 15 mL/min — ABNORMAL LOW (ref >60)
GFR calc non Af Amer: 13 mL/min — ABNORMAL LOW (ref >60)
Glucose, Bld: 116 mg/dL — ABNORMAL HIGH (ref 70–99)
Potassium: 4.9 mmol/L (ref 3.5–5.1)
Sodium: 133 mmol/L — ABNORMAL LOW (ref 135–145)
Total Bilirubin: 1.2 mg/dL (ref 0.3–1.2)
Total Protein: 6.6 g/dL (ref 6.5–8.1)

## 2018-09-05 LAB — URINALYSIS, ROUTINE W REFLEX MICROSCOPIC
Bilirubin Urine: NEGATIVE
Glucose, UA: NEGATIVE mg/dL
Hgb urine dipstick: NEGATIVE
Ketones, ur: NEGATIVE mg/dL
Nitrite: NEGATIVE
Protein, ur: NEGATIVE mg/dL
Specific Gravity, Urine: 1.015 (ref 1.005–1.030)
pH: 5 (ref 5.0–8.0)

## 2018-09-05 LAB — TROPONIN I: TROPONIN I: 1.57 ng/mL — AB (ref <0.03)

## 2018-09-05 MED ORDER — COLCHICINE 0.6 MG PO TABS
0.6000 mg | ORAL_TABLET | Freq: Every day | ORAL | Status: DC | PRN
  Administered 2018-09-08: 0.6 mg via ORAL
  Filled 2018-09-05: qty 1

## 2018-09-05 MED ORDER — FAMOTIDINE 20 MG PO TABS
20.0000 mg | ORAL_TABLET | Freq: Two times a day (BID) | ORAL | Status: DC
  Administered 2018-09-06 (×2): 20 mg via ORAL
  Filled 2018-09-05 (×2): qty 1

## 2018-09-05 MED ORDER — CARVEDILOL 3.125 MG PO TABS
3.1250 mg | ORAL_TABLET | Freq: Two times a day (BID) | ORAL | Status: DC
  Administered 2018-09-06 – 2018-09-11 (×11): 3.125 mg via ORAL
  Filled 2018-09-05 (×11): qty 1

## 2018-09-05 MED ORDER — SODIUM CHLORIDE 0.9 % IV SOLN
INTRAVENOUS | Status: DC
  Administered 2018-09-06 – 2018-09-07 (×3): via INTRAVENOUS

## 2018-09-05 MED ORDER — ONDANSETRON HCL 4 MG PO TABS: 4.0000 mg | ORAL_TABLET | Freq: Four times a day (QID) | ORAL | Status: DC | PRN

## 2018-09-05 MED ORDER — CITALOPRAM HYDROBROMIDE 20 MG PO TABS
20.0000 mg | ORAL_TABLET | Freq: Every day | ORAL | Status: DC
  Administered 2018-09-06 – 2018-09-11 (×6): 20 mg via ORAL
  Filled 2018-09-05 (×7): qty 2

## 2018-09-05 MED ORDER — GABAPENTIN 100 MG PO CAPS
100.0000 mg | ORAL_CAPSULE | Freq: Two times a day (BID) | ORAL | Status: DC
  Administered 2018-09-06 – 2018-09-11 (×12): 100 mg via ORAL
  Filled 2018-09-05 (×12): qty 1

## 2018-09-05 MED ORDER — LIDOCAINE VISCOUS HCL 2 % MT SOLN
15.0000 mL | OROMUCOSAL | Status: DC
  Administered 2018-09-06 – 2018-09-11 (×19): 15 mL via OROMUCOSAL
  Filled 2018-09-05 (×18): qty 15

## 2018-09-05 MED ORDER — ONDANSETRON HCL 4 MG/2ML IJ SOLN: 4.0000 mg | Freq: Four times a day (QID) | INTRAMUSCULAR | Status: DC | PRN

## 2018-09-05 MED ORDER — SODIUM CHLORIDE 0.9 % IV BOLUS
500.0000 mL | Freq: Once | INTRAVENOUS | Status: AC
  Administered 2018-09-05: 500 mL via INTRAVENOUS

## 2018-09-05 MED ORDER — ALBUTEROL SULFATE (2.5 MG/3ML) 0.083% IN NEBU: 1.2500 mg | INHALATION_SOLUTION | RESPIRATORY_TRACT | Status: DC | PRN

## 2018-09-05 MED ORDER — ALPRAZOLAM 0.25 MG PO TABS
0.2500 mg | ORAL_TABLET | Freq: Two times a day (BID) | ORAL | Status: DC | PRN
  Administered 2018-09-07 – 2018-09-11 (×5): 0.25 mg via ORAL
  Filled 2018-09-05 (×5): qty 1

## 2018-09-05 MED ORDER — ALLOPURINOL 100 MG PO TABS
100.0000 mg | ORAL_TABLET | Freq: Every day | ORAL | Status: DC
  Administered 2018-09-06 – 2018-09-11 (×6): 100 mg via ORAL
  Filled 2018-09-05 (×6): qty 1

## 2018-09-05 MED ORDER — ASPIRIN 81 MG PO CHEW
81.0000 mg | CHEWABLE_TABLET | Freq: Every day | ORAL | Status: DC
  Administered 2018-09-06 – 2018-09-10 (×6): 81 mg via ORAL
  Filled 2018-09-05 (×6): qty 1

## 2018-09-05 MED ORDER — INSULIN GLARGINE 100 UNIT/ML ~~LOC~~ SOLN
40.0000 [IU] | Freq: Two times a day (BID) | SUBCUTANEOUS | Status: DC
  Administered 2018-09-06: 30 [IU] via SUBCUTANEOUS
  Filled 2018-09-05 (×2): qty 0.4

## 2018-09-05 MED ORDER — VITAMIN C 500 MG PO TABS
500.0000 mg | ORAL_TABLET | Freq: Every day | ORAL | Status: DC
  Administered 2018-09-06 – 2018-09-11 (×6): 500 mg via ORAL
  Filled 2018-09-05 (×6): qty 1

## 2018-09-05 MED ORDER — POTASSIUM CHLORIDE CRYS ER 20 MEQ PO TBCR
20.0000 meq | EXTENDED_RELEASE_TABLET | Freq: Every day | ORAL | Status: DC
  Filled 2018-09-05: qty 1

## 2018-09-05 MED ORDER — VITAMIN B-12 1000 MCG PO TABS
2000.0000 ug | ORAL_TABLET | Freq: Every day | ORAL | Status: DC
  Administered 2018-09-06 – 2018-09-11 (×6): 2000 ug via ORAL
  Filled 2018-09-05 (×6): qty 2

## 2018-09-05 MED ORDER — HYDROCODONE-ACETAMINOPHEN 5-325 MG PO TABS
0.5000 | ORAL_TABLET | ORAL | Status: DC | PRN
  Administered 2018-09-06 – 2018-09-11 (×18): 0.5 via ORAL
  Filled 2018-09-05 (×18): qty 1

## 2018-09-05 MED ORDER — NITROGLYCERIN 0.4 MG SL SUBL: 0.4000 mg | SUBLINGUAL_TABLET | SUBLINGUAL | Status: DC | PRN

## 2018-09-05 MED ORDER — HEPARIN SODIUM (PORCINE) 5000 UNIT/ML IJ SOLN
5000.0000 [IU] | Freq: Three times a day (TID) | INTRAMUSCULAR | Status: DC
  Administered 2018-09-06 – 2018-09-11 (×16): 5000 [IU] via SUBCUTANEOUS
  Filled 2018-09-05 (×16): qty 1

## 2018-09-05 NOTE — ED Provider Notes (Signed)
[facility] EMERGENCY DEPARTMENT Provider Note   CSN: [id number] Arrival date & time: 09/05/18  1720    History   Chief Complaint No chief complaint on file.   HPI [name] is a 85 y.o. female.     HPI Patient presents from [facility] him rehab.  Has had decreased oral intake decreased activity over the last week.  States she is been so weak she was been unable to get up.  No chest pain.  States she has been getting weaker in her legs but just feels weak all over.  No chest pain.  No trouble breathing.  Reportedly sent in for abnormal EKG also.  Has previously been on hospice.  Is currently paced. Past Medical History:  Diagnosis Date  . Aneurysm ([place])    Apical; previously on Coumadin  . Anxiety disorder   . Cancer (HCC)    Skin  . Chronic renal insufficiency    Creatinine 1.17 January 2010  . Coronary artery disease    Cath 02/15/12 revealed RCA as culprit lesion w/ occlusion at site of prior stent, patent LAD stent, chronic LCx and Diagonal dz; Medical therapy   . Depression   . GERD (gastroesophageal reflux disease)   . Hyperlipidemia    H/o GI intolerance to Lipitor  . Impingement syndrome of left shoulder   . Ischemic cardiomyopathy    s/p Medtronic BiV ICD 2006  . Peripheral polyneuropathy    Refractory to several medications  . Systolic and diastolic CHF, chronic (HCC)    echo 03/2012 EF 95-62%, mod diastolic dysfunction, mod MR, mod LAE, mod-severe TR, PASP 44mmHg  . Type 2 diabetes mellitus [facility])     Patient Active Problem List   Diagnosis Date Noted  . SOB (shortness of breath) 05/19/2018  . Chest pain 05/19/2018  . Abnormal LFTs 05/19/2018  . CHF exacerbation ([place]) 11/27/2017  . Elevated troponin   . Chronic pain syndrome 06/05/2015  . Thrombocytopenia ([place]) 06/05/2015  . Weakness generalized 06/04/2015  . Elevated troponin I level 06/04/2015  . Automatic implantable cardiac defibrillator in situ 07/22/2013  . Acute on  chronic systolic heart failure ([place]) 11/24/2012  . Acute on chronic renal failure ([place]) 11/24/2012  . Hypoxemia 04/02/2012  . Stage III chronic kidney disease ([place]) 04/02/2012  . Non-ST elevation (NSTEMI) myocardial infarction ([place]) 02/19/2012  . Statin intolerance   . Biventricular implantable cardioverter-defibrillator in situ   . CAD (coronary artery disease)   . Ischemic cardiomyopathy   . Type 2 diabetes mellitus ([place]) 02/14/2011  . Shortness of breath 01/19/2010  . Coronary atherosclerosis of native coronary artery 07/03/2009  . HLD (hyperlipidemia) 04/12/2009  . SYSTOLIC HEART FAILURE, CHRONIC 04/12/2009    Past Surgical History:  Procedure Laterality Date  . ABDOMINAL HYSTERECTOMY    . CARDIAC DEFIBRILLATOR PLACEMENT  05/28/2009   Medtronic ICD placed by Dr. Caryl Comes secondary to ICM/CHF, [id number] Fidelis lead fracture with ICD storm 2010 requiring new RV lead placement  . CHOLECYSTECTOMY    . EYE SURGERY    . IMPLANTABLE CARDIOVERTER DEFIBRILLATOR GENERATOR CHANGE  09/26/13   BiV ICD generator change by Dr Rayann Heman - MDT Pershing Proud XT CRT-D  . IMPLANTABLE CARDIOVERTER DEFIBRILLATOR GENERATOR CHANGE N/A 09/26/2013   Procedure: IMPLANTABLE CARDIOVERTER DEFIBRILLATOR GENERATOR CHANGE;  Surgeon: Coralyn Mark, MD;  Location: [facility] CATH LAB;  Service: Cardiovascular;  Laterality: N/A;  . LEFT HEART CATHETERIZATION WITH CORONARY ANGIOGRAM N/A 02/15/2012   Procedure: LEFT HEART CATHETERIZATION WITH CORONARY ANGIOGRAM;  Surgeon: [name]  M Martinique, MD;  Location: [facility] CATH LAB;  Service: Cardiovascular;  Laterality: N/A;  . RIGHT HEART CATHETERIZATION N/A 04/27/2012   Procedure: RIGHT HEART CATH;  Surgeon: Larey Dresser, MD;  Location[facility] C[facility] LAB;  Service: Cardiovascular;  Laterality: N/A;     OB History   No obstetric history on file.      Home Medications    Prior to Admission medications   Medication Sig Start Date End Date Taking? Authorizing Provider  albuterol (ACCUNEB) 1.25 MG/3ML nebulizer  solution Take 1 ampule by nebulization every 4 (four) hours as needed for wheezing or shortness of breath.   Yes [provider]  allopurinol (ZYLOPRIM) 100 MG tablet Take 100 mg by mouth daily.    Yes [provider]  ALPRAZolam (XANAX) 0.25 MG tablet Take 0.25 mg by mouth 2 (two) times daily as needed for anxiety.    Yes [provider]  Ascorbic Acid (VITAMIN C) 500 MG CAPS Take 500 mg by mouth daily.    Yes [provider]  aspirin 81 MG chewable tablet Chew 1 tablet (81 mg total) by mouth daily. Patient taking differently: Chew 81 mg by mouth at bedtime.  06/06/15  Yes Rexene Alberts, MD  carvedilol (COREG) 3.125 MG tablet Take 1 tablet (3.125 mg total) by mouth 2 (two) times daily with a meal. 09/15/17  Yes Herminio Commons, MD  citalopram (CELEXA) 20 MG tablet Take 20 mg by mouth daily.    Yes [provider]  colchicine 0.6 MG tablet Take 0.6 mg by mouth daily as needed (gout).   Yes [provider]  cyanocobalamin 2000 MCG tablet Take 2,000 mcg by mouth daily.   Yes [provider]  gabapentin (NEURONTIN) 100 MG capsule Take 100 mg by mouth 2 (two) times daily.   Yes [provider]  HYDROcodone-acetaminophen (NORCO/VICODIN) 5-325 MG tablet Take 0.5 tablets by mouth every 4 (four) hours as needed for moderate pain.   Yes [provider]  insulin glargine (LANTUS) 100 unit/mL SOPN Inject 40 Units into the skin 2 (two) times daily. 50 units in the morning and 50 units in the evening    Yes [provider]  lidocaine (XYLOCAINE) 2 % solution Use as directed 15 mLs in the mouth or throat every 4 (four) hours.   Yes [provider]  metolazone (ZAROXOLYN) 2.5 MG tablet Take 2.5 mg by mouth 2 (two) times daily as needed (for CHF).   Yes [provider]  nitroGLYCERIN (NITROSTAT) 0.4 MG SL tablet Place 1 tablet (0.4 mg total) under the tongue every 5 (five) minutes as needed for chest pain.  06/14/13  Yes Minus Breeding, MD  potassium chloride SA (K-DUR,KLOR-CON) 20 MEQ tablet Take 1 tablet (20 mEq total) by mouth daily. 03/14/18  Yes Herminio Commons, MD  ranitidine (ZANTAC) 150 MG capsule Take 150 mg by mouth 2 (two) times daily.    Yes [provider]  torsemide (DEMADEX) 20 MG tablet Take 2 tablets (40 mg total) by mouth 2 (two) times daily. 05/22/18  Yes Aline August, MD  mometasone-formoterol (DULERA) 100-5 MCG/ACT AERO Inhale 2 puffs into the lungs 2 (two) times daily. Patient not taking: Reported on 09/05/2018 05/22/18   Aline August, MD  OXYGEN Inhale 2 L into the lungs continuous.    [provider]  senna-docusate (SENOKOT-S) 8.6-50 MG tablet Take 1 tablet by mouth 2 (two) times daily. Patient not taking: Reported on 09/05/2018 05/22/18   Aline August, MD  Family History Family History  Problem Relation Age of Onset  . Heart attack Father   . Heart disease Sister   . Heart disease Brother   . Coronary artery disease Neg Hx     Social History Social History   Tobacco Use  . Smoking status: Former Smoker    Packs/day: 0.80    Years: 30.00    Pack years: 24.00    Types: Cigarettes    Start date: 04/05/1957    Last attempt to quit: 06/27/2002    Years since quitting: 16.2  . Smokeless tobacco: Never Used  Substance Use Topics  . Alcohol use: No    Alcohol/week: 0.0 standard drinks  . Drug use: No     Allergies   Augmentin [amoxicillin-pot clavulanate]; Metformin; Metolazone; and Sulfonamide derivatives   Review of Systems Review of Systems  Constitutional: Positive for appetite change.  HENT: Negative for congestion.   Respiratory: Negative for shortness of breath.   Cardiovascular: Negative for chest pain.  Gastrointestinal: Negative for abdominal pain.  Genitourinary: Negative for dysuria.  Musculoskeletal: Positive for back pain.       Chronic back pain.  Also chronic pain from oral cancer.  Skin: Negative for  wound.  Neurological: Positive for weakness.     Physical Exam Updated Vital Signs BP 112/61   Pulse 64   Temp (!) 97.5 F (36.4 C) (Oral)   Resp (!) 22   Ht 5' (1.524 m)   Wt 73.9 kg   SpO2 100%   BMI 31.83 kg/m   Physical Exam Vitals signs and nursing note reviewed.  HENT:     Head: Normocephalic and atraumatic.     Mouth/Throat:     Mouth: Mucous membranes are dry.  Cardiovascular:     Rate and Rhythm: Regular rhythm.  Pulmonary:     Breath sounds: No wheezing or rhonchi.  Abdominal:     Palpations: There is no mass.     Tenderness: There is no abdominal tenderness.     Hernia: No hernia is present.     Comments: No hernia or distention.  Musculoskeletal:     Comments: Tenderness bilateral lower extremities.  Skin:    General: Skin is warm.  Neurological:     General: No focal deficit present.     Mental Status: She is alert.      ED Treatments / Results  Labs (all labs ordered are listed, but only abnormal results are displayed) Labs Reviewed  COMPREHENSIVE METABOLIC PANEL - Abnormal; Notable for the following components:      Result Value   Sodium 133 (*)    Glucose, Bld 116 (*)    BUN 80 (*)    Creatinine, Ser 3.19 (*)    Calcium 8.4 (*)    Albumin 3.1 (*)    GFR calc non Af Amer 13 (*)    GFR calc Af Amer 15 (*)    All other components within normal limits  TROPONIN I - Abnormal; Notable for the following components:   Troponin I 1.57 (*)    All other components within normal limits  CBC WITH DIFFERENTIAL/PLATELET - Abnormal; Notable for the following components:   RBC 3.76 (*)    Hemoglobin 9.0 (*)    HCT 32.2 (*)    MCH 23.9 (*)    MCHC 28.0 (*)    RDW 18.7 (*)    All other components within normal limits  URINALYSIS, ROUTINE W REFLEX MICROSCOPIC - Abnormal; Notable for the following components:  Leukocytes,Ua TRACE (*)    Bacteria, UA RARE (*)    All other components within normal limits    EKG None  Radiology Dg Chest 1 View   Result Date: 09/05/2018 CLINICAL DATA:  Weakness EXAM: CHEST  1 VIEW COMPARISON:  08/13/2018 FINDINGS: Marked cardiac enlargement. AICD with multiple pacemaker leads some which are disconnected. Negative for heart failure. Mild left lower lobe atelectasis and small left effusion. Right lung clear. IMPRESSION: Marked cardiac enlargement without heart failure Mild left lower lobe atelectasis and small left effusion. Electronically Signed   By: Franchot Gallo M.D.   On: 09/05/2018 18:33    Procedures Procedures (including critical care time)  Medications Ordered in ED Medications  sodium chloride 0.9 % bolus 500 mL (500 mLs Intravenous New Bag/Given 09/05/18 1857)     Initial Impression / Assessment and Plan / ED Course  I have reviewed the triage vital signs and the nursing notes.  Pertinent labs & imaging results that were available during my care of the patient were reviewed by me and considered in my medical decision making (see chart for details).        Patient with generalized weakness decreased oral intake.  Creatinine mildly increased from baseline.  Troponin also up but not as high as previously.  Is chronically ill and has been on and off hospice.  Feels the patient benefit for admission to the hospital.  Will discuss with hospitalist.  Final Clinical Impressions(s) / ED Diagnoses   Final diagnoses:  Weakness  Elevated troponin  Acute on chronic renal insufficiency    ED Discharge Orders    None       Davonna Belling, MD 09/05/18 2134

## 2018-09-05 NOTE — ED Notes (Signed)
Pt placed on Purewick at this time.

## 2018-09-05 NOTE — H&P (Signed)
History and Physical   [name] HDQ:[id number] DOB: December 23, 1933 DOA: 09/05/2018  Referring MD/NP/PA: Dr. Alvino Chapel  PCP: Loman Brooklyn, FNP   Patient coming from: Skilled facility  Chief Complaint: Weakness  HPI: [name] is a 85 y.o. female with medical history significant of Chronic kidney disease, coronary artery disease, allergy disorder, diabetes, peripheral vascular disease, CHF who was brought in from facility secondary to weakness.  She also has shortness of breath and some chest pain.  Patient is a poor historian not able to give a condition.  She is from the skilled facility also.  Patient has been well known to [place] with previous hospice which she has since dismissed.  She is also had previous admission with chronic pain issues.  She was at [facility] in the past where she was treated with some oral lesion that was removed.  At that time she was thought to have a cataract that was not the case.  Patient was seen in the ER and evaluated.  Renal function appears to have worsened from baseline.  She she is being admitted with acute on chronic renal insufficiency..  ED Course: Temperature 97.5 pulse 143/52 pulse 66 respiratory 29 and oxygen sat of 98% room air.  Her white count is normal.  Sodium 133 BUN 80 creatinine is 3.19 with troponin of 1.57.  Calcium 8.4 and hemoglobin 9.0.  Chest x-ray showed marked cardiomegaly but no CHF.  Review of Systems: As per HPI otherwise 10 point review of systems negative.    Past Medical History:  Diagnosis Date  . Aneurysm ([place])    Apical; previously on Coumadin  . Anxiety disorder   . Cancer (HCC)    Skin  . Chronic renal insufficiency    Creatinine 1.17 January 2010  . Coronary artery disease    Cath 02/15/12 revealed RCA as culprit lesion w/ occlusion at site of prior stent, patent LAD stent, chronic LCx and Diagonal dz; Medical therapy   . Depression   . GERD (gastroesophageal reflux disease)   . Hyperlipidemia    H/o GI intolerance to Lipitor  . Impingement syndrome of left shoulder   . Ischemic cardiomyopathy    s/p Medtronic BiV ICD 2006  . Peripheral polyneuropathy    Refractory to several medications  . Systolic and diastolic CHF, chronic (HCC)    echo 03/2012 EF 11-94%, mod diastolic dysfunction, mod MR, mod LAE, mod-severe TR, PASP 71mmHg  . Type 2 diabetes mellitus ([place])     Past Surgical History:  Procedure Laterality Date  . ABDOMINAL HYSTERECTOMY    . CARDIAC DEFIBRILLATOR PLACEMENT  05/28/2009   Medtronic ICD placed by Dr. Caryl Comes secondary to ICM/CHF, [phone] Fidelis lead fracture with ICD storm 2010 requiring new RV lead placement  . CHOLECYSTECTOMY    . EYE SURGERY    . IMPLANTABLE CARDIOVERTER DEFIBRILLATOR GENERATOR CHANGE  09/26/13   BiV ICD generator change by Dr Rayann Heman - MDT Pershing Proud XT CRT-D  . IMPLANTABLE CARDIOVERTER DEFIBRILLATOR GENERATOR CHANGE N/A 09/26/2013   Procedure: IMPLANTABLE CARDIOVERTER DEFIBRILLATOR GENERATOR CHANGE;  Surgeon: Coralyn Mark, MD;  Location: [facility] CATH LAB;  Service: Cardiovascular;  Laterality: N/A;  . LEFT HEART CATHETERIZATION WITH CORONARY ANGIOGRAM N/A 02/15/2012   Procedure: LEFT HEART CATHETERIZATION WITH CORONARY ANGIOGRAM;  Surgeon: Peter M Martinique, MD;  Location: [facility] CATH LAB;  Service: Cardiovascular;  Laterality: N/A;  . RIGHT HEART CATHETERIZATION N/A 04/27/2012   Procedure: RIGHT HEART CATH;  Surgeon: Larey Dresser, MD;  Location: [facility] CATH LAB;  Service: Cardiovascular;  Laterality: N/A;     reports that she quit smoking about 16 years ago. Her smoking use included cigarettes. She started smoking about 61 years ago. She has a 24.00 pack-year smoking history. She has never used smokeless tobacco. She reports that she does not drink alcohol or use drugs.  Allergies  Allergen Reactions  . Augmentin [Amoxicillin-Pot Clavulanate] Other (See Comments)    Abdominal pain  . Metformin Other (See Comments)    Upset stomach  . Metolazone Other (See  Comments)    "made everything hurt" 07/03/15  . Sulfonamide Derivatives Other (See Comments)    swelling    Family History  Problem Relation Age of Onset  . Heart attack Father   . Heart disease Sister   . Heart disease Brother   . Coronary artery disease Neg Hx      Prior to Admission medications   Medication Sig Start Date End Date Taking? Authorizing Provider  albuterol (ACCUNEB) 1.25 MG/3ML nebulizer solution Take 1 ampule by nebulization every 4 (four) hours as needed for wheezing or shortness of breath.   Yes [provider]  allopurinol (ZYLOPRIM) 100 MG tablet Take 100 mg by mouth daily.    Yes [provider]  ALPRAZolam (XANAX) 0.25 MG tablet Take 0.25 mg by mouth 2 (two) times daily as needed for anxiety.    Yes [provider]  Ascorbic Acid (VITAMIN C) 500 MG CAPS Take 500 mg by mouth daily.    Yes [provider]  aspirin 81 MG chewable tablet Chew 1 tablet (81 mg total) by mouth daily. Patient taking differently: Chew 81 mg by mouth at bedtime.  06/06/15  Yes Rexene Alberts, MD  carvedilol (COREG) 3.125 MG tablet Take 1 tablet (3.125 mg total) by mouth 2 (two) times daily with a meal. 09/15/17  Yes Herminio Commons, MD  citalopram (CELEXA) 20 MG tablet Take 20 mg by mouth daily.    Yes [provider]  colchicine 0.6 MG tablet Take 0.6 mg by mouth daily as needed (gout).   Yes [provider]  cyanocobalamin 2000 MCG tablet Take 2,000 mcg by mouth daily.   Yes [provider]  gabapentin (NEURONTIN) 100 MG capsule Take 100 mg by mouth 2 (two) times daily.   Yes [provider]  HYDROcodone-acetaminophen (NORCO/VICODIN) 5-325 MG tablet Take 0.5 tablets by mouth every 4 (four) hours as needed for moderate pain.   Yes [provider]  insulin glargine (LANTUS) 100 unit/mL SOPN Inject 40 Units into the skin 2 (two) times daily. 50 units in the morning and 50 units in the evening    Yes [provider]  lidocaine (XYLOCAINE) 2 % solution Use as directed 15 mLs in the mouth or throat every 4 (four) hours.   Yes [provider]  metolazone (ZAROXOLYN) 2.5 MG tablet Take 2.5 mg by mouth 2 (two) times daily as needed (for CHF).   Yes [provider]  nitroGLYCERIN (NITROSTAT) 0.4 MG SL tablet Place 1 tablet (0.4 mg total) under the tongue every 5 (five) minutes as needed for chest pain. 06/14/13  Yes Minus Breeding, MD  potassium chloride SA (K-DUR,KLOR-CON) 20 MEQ tablet Take 1 tablet (20 mEq total) by mouth daily. 03/14/18  Yes Herminio Commons, MD  ranitidine (ZANTAC) 150 MG capsule Take 150 mg by mouth 2 (two) times daily.    Yes [provider]  torsemide (DEMADEX) 20 MG tablet Take 2 tablets (40 mg total) by  mouth 2 (two) times daily. 05/22/18  Yes Aline August, MD  mometasone-formoterol (DULERA) 100-5 MCG/ACT AERO Inhale 2 puffs into the lungs 2 (two) times daily. Patient not taking: Reported on 09/05/2018 05/22/18   Aline August, MD  OXYGEN Inhale 2 L into the lungs continuous.    [provider]  senna-docusate (SENOKOT-S) 8.6-50 MG tablet Take 1 tablet by mouth 2 (two) times daily. Patient not taking: Reported on 09/05/2018 05/22/18   Aline August, MD    Physical Exam: Vitals:   09/05/18 2000 09/05/18 2100 09/05/18 2200 09/05/18 2230  BP: (!) 112/56 112/61 (!) 143/52 112/61  Pulse: 65 64 65 64  Resp: 11 (!) 22 (!) 29 (!) 22  Temp:      TempSrc:      SpO2: 100% 100% 100% 100%  Weight:      Height:          Constitutional: NAD, calm, Chronically ill looking Vitals:   09/05/18 2000 09/05/18 2100 09/05/18 2200 09/05/18 2230  BP: (!) 112/56 112/61 (!) 143/52 112/61  Pulse: 65 64 65 64  Resp: 11 (!) 22 (!) 29 (!) 22  Temp:      TempSrc:      SpO2: 100% 100% 100% 100%  Weight:      Height:       Eyes: PERRL, lids and conjunctivae normal ENMT: Mucous membranes are moist. Posterior pharynx clear of any exudate or  lesions.Normal dentition.  Neck: normal, supple, no masses, no thyromegaly Respiratory: clear to auscultation bilaterally, no wheezing, no crackles. Normal respiratory effort. No accessory muscle use.  Cardiovascular: Regular rate and rhythm, no murmurs / rubs / gallops. No extremity edema. 2+ pedal pulses. No carotid bruits.  Abdomen: no tenderness, no masses palpated. No hepatosplenomegaly. Bowel sounds positive.  Musculoskeletal: no clubbing / cyanosis. No joint deformity upper and lower extremities. Good ROM, no contractures. Normal muscle tone.  Skin: no rashes, lesions, ulcers. No induration Neurologic: CN 2-12 grossly intact. Sensation intact, DTR normal. Strength 5/5 in all 4.  Psychiatric: Confused. Alert and oriented x 3. Normal mood.     Labs on Admission: I have personally reviewed following labs and imaging studies  CBC: Recent Labs  Lab 09/05/18 1856  WBC 7.1  NEUTROABS 5.1  HGB 9.0*  HCT 32.2*  MCV 85.6  PLT 568   Basic Metabolic Panel: Recent Labs  Lab 09/05/18 1856  NA 133*  K 4.9  CL 98  CO2 22  GLUCOSE 116*  BUN 80*  CREATININE 3.19*  CALCIUM 8.4*   GFR: Estimated Creatinine Clearance: 12.2 mL/min (A) (by C-G formula based on SCr of 3.19 mg/dL (H)). Liver Function Tests: Recent Labs  Lab 09/05/18 1856  AST 33  ALT 22  ALKPHOS 109  BILITOT 1.2  PROT 6.6  ALBUMIN 3.1*   No results for input(s): LIPASE, AMYLASE in the last 168 hours. No results for input(s): AMMONIA in the last 168 hours. Coagulation Profile: No results for input(s): INR, PROTIME in the last 168 hours. Cardiac Enzymes: Recent Labs  Lab 09/05/18 1856  TROPONINI 1.57*   BNP (last 3 results) No results for input(s): PROBNP in the last 8760 hours. HbA1C: No results for input(s): HGBA1C in the last 72 hours. CBG: No results for input(s): GLUCAP in the last 168 hours. Lipid Profile: No results for input(s): CHOL, HDL, LDLCALC, TRIG, CHOLHDL, LDLDIRECT in the last 72 hours.  Thyroid Function Tests: No results for input(s): TSH, T4TOTAL, FREET4, T3FREE, THYROIDAB in the last 72 hours.  Anemia Panel: No results for input(s): VITAMINB12, FOLATE, FERRITIN, TIBC, IRON, RETICCTPCT in the last 72 hours. Urine analysis:    Component Value Date/Time   COLORURINE YELLOW 09/05/2018 2018   APPEARANCEUR CLEAR 09/05/2018 2018   LABSPEC 1.015 09/05/2018 2018   PHURINE 5.0 09/05/2018 2018   GLUCOSEU NEGATIVE 09/05/2018 2018   HGBUR NEGATIVE 09/05/2018 2018   [place] NEGATIVE 09/05/2018 2018   [place] NEGATIVE 09/05/2018 2018   PROTEINUR NEGATIVE 09/05/2018 2018   NITRITE NEGATIVE 09/05/2018 2018   LEUKOCYTESUR TRACE (A) 09/05/2018 2018   Sepsis Labs: @LABRCNTIP (procalcitonin:4,lacticidven:4) )No results found for this or any previous visit (from the past 240 hour(s)).   Radiological Exams on Admission: Dg Chest 1 View  Result Date: 09/05/2018 CLINICAL DATA:  Weakness EXAM: CHEST  1 VIEW COMPARISON:  08/13/2018 FINDINGS: Marked cardiac enlargement. AICD with multiple pacemaker leads some which are disconnected. Negative for heart failure. Mild left lower lobe atelectasis and small left effusion. Right lung clear. IMPRESSION: Marked cardiac enlargement without heart failure Mild left lower lobe atelectasis and small left effusion. Electronically Signed   By: Franchot Gallo M.D.   On: 09/05/2018 18:33      Assessment/Plan Principal Problem:   Acute on chronic renal failure (HCC) Active Problems:   HLD (hyperlipidemia)   Coronary atherosclerosis of native coronary artery   SYSTOLIC HEART FAILURE, CHRONIC   Type 2 diabetes mellitus (HCC)   Biventricular implantable cardioverter-defibrillator in situ   Weakness generalized   Elevated troponin I level   Renal failure (ARF), acute on chronic (HCC)     #1 acute on chronic renal failure: Patient has advanced renal disease.  She has had little improvement.  Also cardiac derangement.  We will admit her and gently  hydrate due to her low EF.  Follow renal function.  Get PT and OT.  #2 elevated troponin: Non-ST elevation MI suspected.  Worsening renal function however may be contributing.  We will trend her enzymes.  Patient is supposed to be DNR but has changed her status several times.  #3 diabetes: Continue home regimen with sliding scale insulin.  #4 hypertension: Continue home regimen.  #5 ischemic cardiomyopathy: Again troponin is elevated.  Monitor closely.  #6 generalized debility: Patient lives in a skilled facility.  She has several comorbidities.  PT OT Karthikeyan when stable.  #7 chronic pain issues: Patient has had chronic chronic pain in the past.  Avoiding excessive narcotics.   DVT prophylaxis: Heparin Code Status: DNR Family Communication: No family at bedside Disposition Plan: Back to skilled facility Consults called: None Admission status: Inpatient  Severity of Illness: The appropriate patient status for this patient is INPATIENT. Inpatient status is judged to be reasonable and necessary in order to provide the required intensity of service to ensure the patient's safety. The patient's presenting symptoms, physical exam findings, and initial radiographic and laboratory data in the context of their chronic comorbidities is felt to place them at high risk for further clinical deterioration. Furthermore, it is not anticipated that the patient will be medically stable for discharge from the hospital within 2 midnights of admission. The following factors support the patient status of inpatient.   " The patient's presenting symptoms include weakness. " The worrisome physical exam findings include confusion. " The initial radiographic and laboratory data are worrisome because of worsening renal function. " The chronic co-morbidities include diabetes hypertension chronic kidney disease.   * I certify that at the point of admission it is my clinical judgment that the patient  will  require inpatient hospital care spanning beyond 2 midnights from the point of admission due to high intensity of service, high risk for further deterioration and high frequency of surveillance required.Barbette Merino MD [facility] Pager [phone]  If 7PM-7AM, please contact night-coverage [email or web address] Password [facility]  09/05/2018, 11:41 PM

## 2018-09-05 NOTE — ED Notes (Signed)
ED TO INPATIENT HANDOFF REPORT  ED Nurse Name and Phone #:  Rodney Langton [phone]  S Name/Age/Gender [name] 85 y.o. female Room/Bed: 015C/015C  Code Status   Code Status: Prior  Home/SNF/Other Nursing Home Patient oriented to: self, place and time Is this baseline? Yes   Triage Complete: Triage complete  Chief Complaint Abn EKG  Triage Note Pt came from [facility] rehabilitation center, nurse from the facility reported to EMS that pt had not been eating & drinking well for the past week & that she had an abnormal EKG. The pt reported that she fell this morning & afterwards she couldn't walk or sit up, which is why she's here. (per pt)    Allergies Allergies  Allergen Reactions  . Augmentin [Amoxicillin-Pot Clavulanate] Other (See Comments)    Abdominal pain  . Metformin Other (See Comments)    Upset stomach  . Metolazone Other (See Comments)    "made everything hurt" 07/03/15  . Sulfonamide Derivatives Other (See Comments)    swelling    Level of Care/Admitting Diagnosis ED Disposition    ED Disposition Condition Comment   Admit  Hospital Area: [facility] [[id number]]  Level of Care: Telemetry Medical [104]  Diagnosis: Renal failure (ARF), acute on chronic [facility]) [[id number]]  Admitting Physician: Elwyn Reach [[id number]]  Attending Physician: Elwyn Reach [[id number]]  Estimated length of stay: past midnight tomorrow  Certification:: I certify this patient will need inpatient services for at least 2 midnights  PT Class (Do Not Modify): Inpatient [101]  PT Acc Code (Do Not Modify): Private [1]       B Medical/Surgery History Past Medical History:  Diagnosis Date  . Aneurysm ([place])    Apical; previously on Coumadin  . Anxiety disorder   . Cancer (HCC)    Skin  . Chronic renal insufficiency    Creatinine 1.17 January 2010  . Coronary artery disease    Cath 02/15/12 revealed RCA as culprit lesion w/ occlusion at site of prior stent, patent  LAD stent, chronic LCx and Diagonal dz; Medical therapy   . Depression   . GERD (gastroesophageal reflux disease)   . Hyperlipidemia    H/o GI intolerance to Lipitor  . Impingement syndrome of left shoulder   . Ischemic cardiomyopathy    s/p Medtronic BiV ICD 2006  . Peripheral polyneuropathy    Refractory to several medications  . Systolic and diastolic CHF, chronic (HCC)    echo 03/2012 EF 95-62%, mod diastolic dysfunction, mod MR, mod LAE, mod-severe TR, PASP 56mmHg  . Type 2 diabetes mellitus ([place])    Past Surgical History:  Procedure Laterality Date  . ABDOMINAL HYSTERECTOMY    . CARDIAC DEFIBRILLATOR PLACEMENT  05/28/2009   Medtronic ICD placed by Dr. Caryl Comes secondary to ICM/CHF, [phone] Fidelis lead fracture with ICD storm 2010 requiring new RV lead placement  . CHOLECYSTECTOMY    . EYE SURGERY    . IMPLANTABLE CARDIOVERTER DEFIBRILLATOR GENERATOR CHANGE  09/26/13   BiV ICD generator change by Dr Rayann Heman - MDT Pershing Proud XT CRT-D  . IMPLANTABLE CARDIOVERTER DEFIBRILLATOR GENERATOR CHANGE N/A 09/26/2013   Procedure: IMPLANTABLE CARDIOVERTER DEFIBRILLATOR GENERATOR CHANGE;  Surgeon: Coralyn Mark, MD;  Location: [facility] CATH LAB;  Service: Cardiovascular;  Laterality: N/A;  . LEFT HEART CATHETERIZATION WITH CORONARY ANGIOGRAM N/A 02/15/2012   Procedure: LEFT HEART CATHETERIZATION WITH CORONARY ANGIOGRAM;  Surgeon: Peter M Martinique, MD;  Location: [facility] CATH LAB;  Service: Cardiovascular;  Laterality: N/A;  . RIGHT HEART  CATHETERIZATION N/A 04/27/2012   Procedure: RIGHT HEART CATH;  Surgeon: Larey Dresser, MD;  Location: [facility] CATH LAB;  Service: Cardiovascular;  Laterality: N/A;     A IV Location/Drains/Wounds Patient Lines/Drains/Airways Status   Active Line/Drains/Airways    Name:   Placement date:   Placement time:   Site:   Days:   Peripheral IV Right Antecubital   -    -    Antecubital      External Urinary Catheter   11/28/17    0130    -   281   Wound 02/15/12 Other (Comment) Ankle Left red  discoloration   02/15/12    1945    Ankle   2394   Wound / Incision (Open or Dehisced) 05/19/18 Diabetic ulcer Foot Right   05/19/18    0139    Foot   109          Intake/Output Last 24 hours No intake or output data in the 24 hours ending 09/05/18 2228  Labs/Imaging Results for orders placed or performed during the hospital encounter of 09/05/18 (from the past 48 hour(s))  Comprehensive metabolic panel     Status: Abnormal   Collection Time: 09/05/18  6:56 PM  Result Value Ref Range   Sodium 133 (L) 135 - 145 mmol/L   Potassium 4.9 3.5 - 5.1 mmol/L   Chloride 98 98 - 111 mmol/L   CO2 22 22 - 32 mmol/L   Glucose, Bld 116 (H) 70 - 99 mg/dL   BUN 80 (H) 8 - 23 mg/dL   Creatinine, Ser 3.19 (H) 0.44 - 1.00 mg/dL   Calcium 8.4 (L) 8.9 - 10.3 mg/dL   Total Protein 6.6 6.5 - 8.1 g/dL   Albumin 3.1 (L) 3.5 - 5.0 g/dL   AST 33 15 - 41 U/L   ALT 22 0 - 44 U/L   Alkaline Phosphatase 109 38 - 126 U/L   Total Bilirubin 1.2 0.3 - 1.2 mg/dL   GFR calc non Af Amer 13 (L) >60 mL/min   GFR calc Af Amer 15 (L) >60 mL/min   Anion gap 13 5 - 15    Comment: Performed at [facility], [address], [place], [place] [address]  Troponin I - Once     Status: Abnormal   Collection Time: 09/05/18  6:56 PM  Result Value Ref Range   Troponin I 1.57 (HH) <0.03 ng/mL    Comment: CRITICAL RESULT CALLED TO, READ BACK BY AND VERIFIED WITH: Benjaman Lobe 0211 09/05/2018 D BRADLEY Performed at [facility], [place] [address], [place], [place] [address]   CBC with Differential     Status: Abnormal   Collection Time: 09/05/18  6:56 PM  Result Value Ref Range   WBC 7.1 4.0 - 10.5 K/uL   RBC 3.76 (L) 3.87 - 5.11 MIL/uL   Hemoglobin 9.0 (L) 12.0 - 15.0 g/dL   HCT 32.2 (L) 36.0 - 46.0 %   MCV 85.6 80.0 - 100.0 fL   MCH 23.9 (L) 26.0 - 34.0 pg   MCHC 28.0 (L) 30.0 - 36.0 g/dL   RDW 18.7 (H) 11.5 - 15.5 %   Platelets 163 150 - 400 K/uL   nRBC 0.0 0.0 - 0.2 %   Neutrophils Relative % 72 %   Neutro  Abs 5.1 1.7 - 7.7 K/uL   Lymphocytes Relative 11 %   Lymphs Abs 0.8 0.7 - 4.0 K/uL   Monocytes Relative 11 %  Monocytes Absolute 0.8 0.1 - 1.0 K/uL   Eosinophils Relative 4 %   Eosinophils Absolute 0.3 0.0 - 0.5 K/uL   Basophils Relative 1 %   Basophils Absolute 0.0 0.0 - 0.1 K/uL   Immature Granulocytes 1 %   Abs Immature Granulocytes 0.04 0.00 - 0.07 K/uL    Comment: Performed at [facility] [address], [place], [place] [address]  Urinalysis, Routine w reflex microscopic     Status: Abnormal   Collection Time: 09/05/18  8:18 PM  Result Value Ref Range   Color, Urine YELLOW YELLOW   APPearance CLEAR CLEAR   Specific Gravity, Urine 1.015 1.005 - 1.030   pH 5.0 5.0 - 8.0   Glucose, UA NEGATIVE NEGATIVE mg/dL   Hgb urine dipstick NEGATIVE NEGATIVE   Bilirubin Urine NEGATIVE NEGATIVE   Ketones, ur NEGATIVE NEGATIVE mg/dL   Protein, ur NEGATIVE NEGATIVE mg/dL   Nitrite NEGATIVE NEGATIVE   Leukocytes,Ua TRACE (A) NEGATIVE   RBC / HPF 0-5 0 - 5 RBC/hpf   WBC, UA 0-5 0 - 5 WBC/hpf   Bacteria, UA RARE (A) NONE SEEN   Squamous Epithelial / LPF 0-5 0 - 5   Hyaline Casts, UA PRESENT     Comment: Performed at [facility], [address] [place], [place] [address]   Dg Chest 1 View  Result Date: 09/05/2018 CLINICAL DATA:  Weakness EXAM: CHEST  1 VIEW COMPARISON:  08/13/2018 FINDINGS: Marked cardiac enlargement. AICD with multiple pacemaker leads some which are disconnected. Negative for heart failure. Mild left lower lobe atelectasis and small left effusion. Right lung clear. IMPRESSION: Marked cardiac enlargement without heart failure Mild left lower lobe atelectasis and small left effusion. Electronically Signed   By: Franchot Gallo M.D.   On: 09/05/2018 18:33    Pending Labs [employer] (From admission, onward)    Start     Ordered   Signed and Held  CBC  (heparin)  Once,   R    Comments:  Baseline for heparin therapy IF NOT ALREADY DRAWN.  Notify MD if PLT <  100 K.    Signed and Held   Signed and Held  Creatinine, serum  (heparin)  Once,   R    Comments:  Baseline for heparin therapy IF NOT ALREADY DRAWN.    Signed and Held   Signed and Held  Comprehensive metabolic panel  Tomorrow morning,   R     Signed and Held   Signed and Held  CBC  Tomorrow morning,   R     Signed and Held          Vitals/Pain Today's Vitals   09/05/18 1930 09/05/18 2000 09/05/18 2017 09/05/18 2100  BP: 108/72 (!) 112/56  112/61  Pulse: 65 65  64  Resp: (!) 25 11  (!) 22  Temp:      TempSrc:      SpO2: 100% 100%  100%  Weight:      Height:      PainSc:   6      Isolation Precautions No active isolations  Medications Medications  sodium chloride 0.9 % bolus 500 mL (500 mLs Intravenous New Bag/Given 09/05/18 1857)    Mobility non-ambulatory Low fall risk   Focused Assessments Cardiac Assessment Handoff:    Lab Results  Component Value Date   CKTOTAL 808 (H) 02/16/2012   CKMB 60.4 (HH) 02/16/2012   TROPONINI 1.57 ([place]) 09/05/2018   No results found for:  DDIMER Does the Patient currently have chest pain? No  , Renal Assessment Handoff:  Hemodialysis Schedule:  Last Hemodialysis date and time:  N/A   Restricted appendage:  NONE     R Recommendations: See Admitting Provider Note  Report given to:   Additional Notes:    Not dialysis patient, CKD 4, no chest pain since arrival, no family at bedside

## 2018-09-05 NOTE — ED Triage Notes (Addendum)
Pt came from [facility] rehabilitation center, nurse from the facility reported to EMS that pt had not been eating & drinking well for the past week & that she had an abnormal EKG. The pt reported that she fell this morning & afterwards she couldn't walk or sit up, which is why she's here. (per pt)

## 2018-09-06 ENCOUNTER — Inpatient Hospital Stay (HOSPITAL_COMMUNITY): Payer: Medicare Other

## 2018-09-06 ENCOUNTER — Encounter (HOSPITAL_COMMUNITY): Payer: Self-pay | Admitting: Physician Assistant

## 2018-09-06 DIAGNOSIS — N184 Chronic kidney disease, stage 4 (severe): Secondary | ICD-10-CM

## 2018-09-06 DIAGNOSIS — N170 Acute kidney failure with tubular necrosis: Secondary | ICD-10-CM

## 2018-09-06 LAB — COMPREHENSIVE METABOLIC PANEL
ALK PHOS: 111 U/L (ref 38–126)
ALT: 22 U/L (ref 0–44)
ANION GAP: 13 (ref 5–15)
AST: 32 U/L (ref 15–41)
Albumin: 3.1 g/dL — ABNORMAL LOW (ref 3.5–5.0)
BUN: 87 mg/dL — ABNORMAL HIGH (ref 8–23)
CO2: 26 mmol/L (ref 22–32)
Calcium: 8.7 mg/dL — ABNORMAL LOW (ref 8.9–10.3)
Chloride: 95 mmol/L — ABNORMAL LOW (ref 98–111)
Creatinine, Ser: 3.22 mg/dL — ABNORMAL HIGH (ref 0.44–1.00)
GFR calc Af Amer: 15 mL/min — ABNORMAL LOW (ref >60)
GFR calc non Af Amer: 13 mL/min — ABNORMAL LOW (ref >60)
Glucose, Bld: 88 mg/dL (ref 70–99)
Potassium: 4.4 mmol/L (ref 3.5–5.1)
SODIUM: 134 mmol/L — AB (ref 135–145)
Total Bilirubin: 1.2 mg/dL (ref 0.3–1.2)
Total Protein: 6.6 g/dL (ref 6.5–8.1)

## 2018-09-06 LAB — CBC
HCT: 31.1 % — ABNORMAL LOW (ref 36.0–46.0)
Hemoglobin: 8.9 g/dL — ABNORMAL LOW (ref 12.0–15.0)
MCH: 23.9 pg — ABNORMAL LOW (ref 26.0–34.0)
MCHC: 28.6 g/dL — ABNORMAL LOW (ref 30.0–36.0)
MCV: 83.6 fL (ref 80.0–100.0)
Platelets: 183 10*3/uL (ref 150–400)
RBC: 3.72 MIL/uL — ABNORMAL LOW (ref 3.87–5.11)
RDW: 18.9 % — ABNORMAL HIGH (ref 11.5–15.5)
WBC: 6.8 10*3/uL (ref 4.0–10.5)
nRBC: 0 % (ref 0.0–0.2)

## 2018-09-06 LAB — GLUCOSE, CAPILLARY
GLUCOSE-CAPILLARY: 82 mg/dL (ref 70–99)
Glucose-Capillary: 75 mg/dL (ref 70–99)
Glucose-Capillary: 69 mg/dL — ABNORMAL LOW (ref 70–99)
Glucose-Capillary: 353 mg/dL — ABNORMAL HIGH (ref 70–99)
Glucose-Capillary: 91 mg/dL (ref 70–99)

## 2018-09-06 LAB — MRSA PCR SCREENING: MRSA by PCR: NEGATIVE

## 2018-09-06 MED ORDER — POLYETHYLENE GLYCOL 3350 17 G PO PACK
17.0000 g | PACK | Freq: Two times a day (BID) | ORAL | Status: DC
  Administered 2018-09-07 – 2018-09-11 (×7): 17 g via ORAL
  Filled 2018-09-06 (×8): qty 1

## 2018-09-06 MED ORDER — FAMOTIDINE 20 MG PO TABS
20.0000 mg | ORAL_TABLET | Freq: Every day | ORAL | Status: DC
  Administered 2018-09-07 – 2018-09-08 (×2): 20 mg via ORAL
  Filled 2018-09-06 (×2): qty 1

## 2018-09-06 MED ORDER — INSULIN ASPART 100 UNIT/ML ~~LOC~~ SOLN: 0.0000 [IU] | Freq: Three times a day (TID) | SUBCUTANEOUS | Status: DC

## 2018-09-06 MED ORDER — INSULIN ASPART 100 UNIT/ML ~~LOC~~ SOLN
0.0000 [IU] | Freq: Three times a day (TID) | SUBCUTANEOUS | Status: DC
  Administered 2018-09-06: 9 [IU] via SUBCUTANEOUS
  Administered 2018-09-07: 1 [IU] via SUBCUTANEOUS
  Administered 2018-09-08: 3 [IU] via SUBCUTANEOUS
  Administered 2018-09-09: 1 [IU] via SUBCUTANEOUS
  Administered 2018-09-10: 2 [IU] via SUBCUTANEOUS
  Administered 2018-09-10: 3 [IU] via SUBCUTANEOUS
  Administered 2018-09-11: 7 [IU] via SUBCUTANEOUS

## 2018-09-06 MED ORDER — BISACODYL 10 MG RE SUPP
10.0000 mg | Freq: Once | RECTAL | Status: AC
  Administered 2018-09-06: 10 mg via RECTAL
  Filled 2018-09-06: qty 1

## 2018-09-06 MED ORDER — INSULIN GLARGINE 100 UNIT/ML ~~LOC~~ SOLN
20.0000 [IU] | Freq: Every day | SUBCUTANEOUS | Status: DC
  Administered 2018-09-06: 20 [IU] via SUBCUTANEOUS
  Filled 2018-09-06 (×2): qty 0.2

## 2018-09-06 NOTE — Progress Notes (Signed)
PROGRESS NOTE    [name]  WUJ:[id number] DOB: May 03, 1934 DOA: 09/05/2018 PCP: Loman Brooklyn, FNP    Brief Narrative: 85 year old with past medical history significant for chronic kidney disease 3 last creatinine last discharge from the hospital in November was around 2.2.  Patient presents with generalized weakness, she was not able to ambulate or get up.  She at some point reports some shortness of breath. On my evaluation today she currently denies chest pain or shortness of breath.  She is complaining of her neuropathy pain lower extremity. Patient admitted with acute on chronic renal failure, urinary retention and elevation of troponin.   Assessment & Plan:   Principal Problem:   Acute on chronic renal failure (HCC) Active Problems:   HLD (hyperlipidemia)   Coronary atherosclerosis of native coronary artery   SYSTOLIC HEART FAILURE, CHRONIC   Type 2 diabetes mellitus (HCC)   Biventricular implantable cardioverter-defibrillator in situ   Weakness generalized   Elevated troponin I level   Renal failure (ARF), acute on chronic (HCC)   Estimated body mass index is 31.83 kg/m as calculated from the following:   Height as of this encounter: 5' (1.524 m).   Weight as of this encounter: 73.9 kg.  1-Acute on chronic renal failure stage III; creatinine per records around 2.2. Patient presented with a creatinine at 0.1, BUN at 80. Patient noticed to have urine retention this morning.  Foley catheter placed. Continue with gentle hydration. Will check renal ultrasound. Per prior discharge summary she was deemed not a candidate for dialysis. Monitor renal function daily.  2-Elevation of troponin; in the setting of renal failure.  But there was also some report of chest pain at some point and shortness of breath.  Cardiology has been consulted. Will defer start heparin to cardiology.  3-Anemia probably of chronic disease.  Monitor hemoglobin.  4-Chronic systolic  heart failure; Hold torsemide in the setting of AKI. Monitor for volume overload. Continue with carvedilol. Continue with aspirin.  5-Constipation: Patient reported last bowel movement was done a week ago.  Will check a KUB. She will need suppository if KUB negative for obstruction.  6-Diabetes type 2; She had an episode of hypoglycemia blood sugar in the 60s.  I will add chills insulin regimen.  Subsequently her blood sugar increased to 300. Adding a scale insulin Insulin Lantus to 20 units daily.  7-Generalized weakness PT evaluation.  8-Chronic pain neuropathy.  Continue with low-dose Vicodin   DVT prophylaxis: Heparin. Code Status: DNR Family Communication: no family at bedside.  Disposition Plan: Remain in the hospital for treatment of renal failure.  Generalized weakness.   Consultants:  Cardiology  Procedures: (  None   Antimicrobials:   None   Subjective: Patient currently denies chest pain, shortness of breath.  She is complaining of abdominal pain lower quadrant and suprapubic.  She has not had had a bowel movement for over a week. Reports poor oral intake, poor appetite.  Objective: Vitals:   09/05/18 2200 09/05/18 2230 09/06/18 0200 09/06/18 0400  BP: (!) 143/52 112/61 117/60 112/66  Pulse: 65 64 65 64  Resp: (!) 29 (!) 22 (!) 26 (!) 22  Temp:    97.7 F (36.5 C)  TempSrc:    Oral  SpO2: 100% 100% 100% 99%  Weight:      Height:        Intake/Output Summary (Last 24 hours) at 09/06/2018 1012 Last data filed at 09/06/2018 0600 Gross per 24 hour  Intake 438.73  ml  Output -  Net 438.73 ml   Filed Weights   09/05/18 1749  Weight: 73.9 kg    Examination:  General exam: Appears calm and comfortable  Respiratory system: Clear to auscultation. Respiratory effort normal. Cardiovascular system: S1 & S2 heard, RRR. No JVD, murmurs, rubs, gallops or clicks. No pedal edema. Gastrointestinal system: Abdomen is distended, supra-pubic pain , soft and  nontender. No organomegaly or masses felt. Normal bowel sounds heard. Central nervous system: Alert and oriented. Extremities: Symmetric 5 x 5 power. Skin: No rashes, lesions or ulcers   Data Reviewed: I have personally reviewed following labs and imaging studies  CBC: Recent Labs  Lab 09/05/18 1856 09/06/18 0328  WBC 7.1 6.8  NEUTROABS 5.1  --   HGB 9.0* 8.9*  HCT 32.2* 31.1*  MCV 85.6 83.6  PLT 163 196   Basic Metabolic Panel: Recent Labs  Lab 09/05/18 1856 09/06/18 0328  NA 133* 134*  K 4.9 4.4  CL 98 95*  CO2 22 26  GLUCOSE 116* 88  BUN 80* 87*  CREATININE 3.19* 3.22*  CALCIUM 8.4* 8.7*   GFR: Estimated Creatinine Clearance: 12.1 mL/min (A) (by C-G formula based on SCr of 3.22 mg/dL (H)). Liver Function Tests: Recent Labs  Lab 09/05/18 1856 09/06/18 0328  AST 33 32  ALT 22 22  ALKPHOS 109 111  BILITOT 1.2 1.2  PROT 6.6 6.6  ALBUMIN 3.1* 3.1*   No results for input(s): LIPASE, AMYLASE in the last 168 hours. No results for input(s): AMMONIA in the last 168 hours. Coagulation Profile: No results for input(s): INR, PROTIME in the last 168 hours. Cardiac Enzymes: Recent Labs  Lab 09/05/18 1856  TROPONINI 1.57*   BNP (last 3 results) No results for input(s): PROBNP in the last 8760 hours. HbA1C: No results for input(s): HGBA1C in the last 72 hours. CBG: Recent Labs  Lab 09/06/18 0124 09/06/18 0624  GLUCAP 75 69*   Lipid Profile: No results for input(s): CHOL, HDL, LDLCALC, TRIG, CHOLHDL, LDLDIRECT in the last 72 hours. Thyroid Function Tests: No results for input(s): TSH, T4TOTAL, FREET4, T3FREE, THYROIDAB in the last 72 hours. Anemia Panel: No results for input(s): VITAMINB12, FOLATE, FERRITIN, TIBC, IRON, RETICCTPCT in the last 72 hours. Sepsis Labs: No results for input(s): PROCALCITON, LATICACIDVEN in the last 168 hours.  No results found for this or any previous visit (from the past 240 hour(s)).       Radiology Studies: Dg Chest  1 View  Result Date: 09/05/2018 CLINICAL DATA:  Weakness EXAM: CHEST  1 VIEW COMPARISON:  08/13/2018 FINDINGS: Marked cardiac enlargement. AICD with multiple pacemaker leads some which are disconnected. Negative for heart failure. Mild left lower lobe atelectasis and small left effusion. Right lung clear. IMPRESSION: Marked cardiac enlargement without heart failure Mild left lower lobe atelectasis and small left effusion. Electronically Signed   By: Franchot Gallo M.D.   On: 09/05/2018 18:33        Scheduled Meds: . allopurinol  100 mg Oral Daily  . aspirin  81 mg Oral QHS  . carvedilol  3.125 mg Oral BID WC  . citalopram  20 mg Oral Daily  . famotidine  20 mg Oral BID  . gabapentin  100 mg Oral BID  . heparin  5,000 Units Subcutaneous Q8H  . lidocaine  15 mL Mouth/Throat Q4H while awake  . cyanocobalamin  2,000 mcg Oral Daily  . vitamin C  500 mg Oral Daily   Continuous Infusions: . sodium chloride 75  mL/hr at 09/06/18 0600     LOS: 1 day    Time spent: 35 minutes    Elmarie Shiley, MD [facility] Pager [phone]  If 7PM-7AM, please contact night-coverage [email or web address] Password [facility] 09/06/2018, 10:12 AM

## 2018-09-06 NOTE — Progress Notes (Signed)
Patient has 40 units of Lantus ordered and Lantus is scheduled for BID with the next dose being at 1000. FSBS is 75. Paged Baltazar Najjar, ALP. Ordered to give 30 units of Lantus.

## 2018-09-06 NOTE — Consult Note (Signed)
Cardiology Consultation:   Patient ID: [name]; [id number]; [birth year]   Admit date: 09/05/2018 Date of Consult: 09/06/2018  Primary Care Provider: Loman Brooklyn, FNP Primary Cardiologist: Kate Sable, MD Primary Electrophysiologist:  Thompson Grayer, MD  Chief Complaint: weak  Patient Profile:   KAMRI GOTSCH is a 85 y.o. 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 Tyrell Antonio.  History of Present Illness:   She had prior bare metal stenting of the LAD and RCA around 2011. Her last cath in the setting of late presenting MI in 01/2012 showed three vessel obstructive CAD. The culprit lesion was the right coronary which was occluded at the site of a prior stent. The stent in the proximal LAD was patent. The left circumflex occlusion and diagonal disease appeared chronic. Medical therapy was recommended given late presentation. She had a right heart cath in 2013 while requiring IV milrinone with elevated L/R heart filling pressures. It was previously recommended she see the advanced HF team but has consistently refused. Afib is listed in last hospitalization but not clear where this diagnosis came from. In 01/2018 when she last saw Dr. Rayann Heman, it was noted that her RV lead appeared to have failed since her last visit. Long discussion was had about code status and she elected DNR/DNI and ICD therapies were turned off. Most recently she had an NSTEMI 04/2018 with troponin of 5.68 which was managed medically. She was not felt to be a candidate for dialysis so in the  setting of her renal dysfunction, she was therefore not felt to be a cardiac cath candidate either. VQ scan was low probability for PE. 2D echo showed EF 20-25%, grade 2 DD, mild MR, severe LAE, mild RV dilation/systolic dysfunction, mild TR. She was seen by palliative care at discharge with plans for them to follow as outpatient. The current status of this is somewhat confusing as some notes indicate she was discharged from their care but the ENT note from [facility] indicates in January she had begun hospice 2 days prior. She has recent hard palate tumor removed - their note states,"She underwent WLE of hard palate cancer. Pathology demonstrated Atypical verrucous proliferation, 1.5 cm in greatest dimension. No evidence of invasion. Margins free of tumor." This note also references she had admission 2/13-2/18 for UTI, falls and deconditioning. She has struggled with neuropathic pain in her legs recently and apparently has history of delirium with pain medicine. She has been living at the [facility] and was brought in for numerous issues. She had not been eating or drinking well for the past week and suffered a fall. Afterwards she could got walk or sit up so was brought to the hospital. She says she has not been able to eat well since her hard palate surgery.  She was found to have hyponatremia of 133 and acute on chronic kidney injury with BUN 87, Cr 3.22 (was 2.2 at discharge in 04/2018). She was found to have urinary retention requiring foley. Labs also include anemia with Hgb 8.9 (downtrended from 9.7 in 04/2018). Troponin was obtained on admission which was 1.57. CXR with marked cardiac  enlargement without heart failure; mild LLL atx and small left effusion. She reports about 2-3x over the last few months having 5 minutes of chest burning at rest, last occurred about 1 week prior. She reports dyspnea is stable at baseline.   Past Medical History:  Diagnosis Date   Anemia    Aneurysm ([place])     Apical; previously on Coumadin   Anxiety disorder    Cancer ([place])    Skin   Chronic combined systolic and diastolic CHF (congestive heart failure) ([place])    CKD (chronic kidney disease), stage III (HCC)    Coronary artery disease    a. prior bare metal stenting of the LAD and RCA around 2011. b. Cath 02/15/12 revealed RCA as culprit lesion w/ occlusion at site of prior stent, patent LAD stent, chronic LCx and Diagonal dz; Medical therapy. c. NSTEMI 04/2018 -> med rx.   Depression    GERD (gastroesophageal reflux disease)    Hyperlipidemia    H/o GI intolerance to Lipitor   ICD (implantable cardioverter-defibrillator) malfunction    a. h/o RV lead failure - ICD therapies turned off 01/2018 after pt elected DNR/DNI.   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 Caryl Comes secondary to ICM/CHF, [phone] Fidelis lead fracture with ICD storm 2010 requiring new RV lead placement   CHOLECYSTECTOMY     EYE SURGERY     IMPLANTABLE CARDIOVERTER DEFIBRILLATOR GENERATOR CHANGE  09/26/13   BiV ICD generator change by Dr Rayann Heman - MDT Viva XT CRT-D   [facility] N/A 09/26/2013   Procedure: IMPLANTABLE CARDIOVERTER DEFIBRILLATOR GENERATOR CHANGE;  Surgeon: Coralyn Mark, MD;  Location: [facility] CATH LAB;  Service: Cardiovascular;  Laterality: N/A;   LEFT HEART CATHETERIZATION WITH CORONARY ANGIOGRAM N/A 02/15/2012   Procedure: LEFT HEART CATHETERIZATION WITH CORONARY ANGIOGRAM;  Surgeon: Peter M Martinique, MD;  Location: [facility] CATH LAB;  Service: Cardiovascular;  Laterality: N/A;   RIGHT HEART CATHETERIZATION N/A 04/27/2012   Procedure:  RIGHT HEART CATH;  Surgeon: Larey Dresser, MD;  Location: [facility] CATH LAB;  Service: Cardiovascular;  Laterality: N/A;     Inpatient Medications: Scheduled Meds:  allopurinol  100 mg Oral Daily   aspirin  81 mg Oral QHS   carvedilol  3.125 mg Oral BID WC   citalopram  20 mg Oral Daily   [START ON 09/07/2018] famotidine  20 mg Oral Daily   gabapentin  100 mg Oral BID   heparin  5,000 Units Subcutaneous Q8H   insulin aspart  0-9 Units Subcutaneous TID WC   insulin glargine  20 Units Subcutaneous Daily   lidocaine  15 mL Mouth/Throat Q4H while awake   cyanocobalamin  2,000 mcg Oral Daily   vitamin C  500 mg Oral Daily   Continuous Infusions:  sodium chloride 75 mL/hr at 09/06/18 1100   PRN Meds: albuterol, ALPRAZolam, colchicine, HYDROcodone-acetaminophen, nitroGLYCERIN, ondansetron **OR** ondansetron (ZOFRAN) IV  Home Meds: Prior to Admission medications   Medication Sig Start Date End Date Taking? Authorizing Provider  albuterol (ACCUNEB) 1.25 MG/3ML nebulizer solution Take 1 ampule by nebulization every 4 (four) hours as needed for wheezing or  shortness of breath.   Yes [provider]  allopurinol (ZYLOPRIM) 100 MG tablet Take 100 mg by mouth daily.    Yes [provider]  ALPRAZolam (XANAX) 0.25 MG tablet Take 0.25 mg by mouth 2 (two) times daily as needed for anxiety.    Yes [provider]  Ascorbic Acid (VITAMIN C) 500 MG CAPS Take 500 mg by mouth daily.    Yes [provider]  aspirin 81 MG chewable tablet Chew 1 tablet (81 mg total) by mouth daily. Patient taking differently: Chew 81 mg by mouth at bedtime.  06/06/15  Yes Rexene Alberts, MD  carvedilol (COREG) 3.125 MG tablet Take 1 tablet (3.125 mg total) by mouth 2 (two) times daily with a meal. 09/15/17  Yes Herminio Commons, MD  citalopram (CELEXA) 20 MG tablet Take 20 mg by mouth daily.    Yes [provider]  colchicine 0.6 MG tablet Take 0.6 mg by mouth daily  as needed (gout).   Yes [provider]  cyanocobalamin 2000 MCG tablet Take 2,000 mcg by mouth daily.   Yes [provider]  gabapentin (NEURONTIN) 100 MG capsule Take 100 mg by mouth 2 (two) times daily.   Yes [provider]  HYDROcodone-acetaminophen (NORCO/VICODIN) 5-325 MG tablet Take 0.5 tablets by mouth every 4 (four) hours as needed for moderate pain.   Yes [provider]  insulin glargine (LANTUS) 100 unit/mL SOPN Inject 40 Units into the skin 2 (two) times daily. 50 units in the morning and 50 units in the evening    Yes [provider]  lidocaine (XYLOCAINE) 2 % solution Use as directed 15 mLs in the mouth or throat every 4 (four) hours.   Yes [provider]  metolazone (ZAROXOLYN) 2.5 MG tablet Take 2.5 mg by mouth 2 (two) times daily as needed (for CHF).   Yes [provider]  nitroGLYCERIN (NITROSTAT) 0.4 MG SL tablet Place 1 tablet (0.4 mg total) under the tongue every 5 (five) minutes as needed for chest pain. 06/14/13  Yes Minus Breeding, MD  potassium chloride SA (K-DUR,KLOR-CON) 20 MEQ tablet Take 1 tablet (20 mEq total) by mouth daily. 03/14/18  Yes Herminio Commons, MD  ranitidine (ZANTAC) 150 MG capsule Take 150 mg by mouth 2 (two) times daily.    Yes [provider]  torsemide (DEMADEX) 20 MG tablet Take 2 tablets (40 mg total) by mouth 2 (two) times daily. 05/22/18  Yes Aline August, MD  mometasone-formoterol (DULERA) 100-5 MCG/ACT AERO Inhale 2 puffs into the lungs 2 (two) times daily. Patient not taking: Reported on 09/05/2018 05/22/18   Aline August, MD  OXYGEN Inhale 2 L into the lungs continuous.    [provider]  senna-docusate (SENOKOT-S) 8.6-50 MG tablet Take 1 tablet by mouth 2 (two) times daily. Patient not taking: Reported on 09/05/2018 05/22/18   Aline August, MD    Allergies:    Allergies  Allergen Reactions   Augmentin [Amoxicillin-Pot Clavulanate] Other (See  Comments)    Abdominal pain   Metformin Other (See Comments)    Upset stomach   Metolazone Other (See Comments)    "made everything hurt" 07/03/15   Sulfonamide Derivatives Other (See Comments)    swelling    Social History:   Social History   Socioeconomic History   Marital status: Widowed    Spouse name: Not on file   Number of children: Not on file   Years of education: Not on file  Highest education level: Not on file  Occupational History   Occupation: Retired    Fish farm manager: RETIRED  Scientist, product/process development strain: Not on file   Food insecurity:    Worry: Not on file    Inability: Not on file   Transportation needs:    Medical: Not on file    Non-medical: Not on file  Tobacco Use   Smoking status: Former Smoker    Packs/day: 0.80    Years: 30.00    Pack years: 24.00    Types: Cigarettes    Start date: 04/05/1957    Last attempt to quit: 06/27/2002    Years since quitting: 16.2   Smokeless tobacco: Never Used  Substance and Sexual Activity   Alcohol use: No    Alcohol/week: 0.0 standard drinks   Drug use: No   Sexual activity: Not Currently  Lifestyle   Physical activity:    Days per week: Not on file    Minutes per session: Not on file   Stress: Not on file  Relationships   Social connections:    Talks on phone: Not on file    Gets together: Not on file    Attends religious service: Not on file    Active member of club or organization: Not on file    Attends meetings of clubs or organizations: Not on file    Relationship status: Not on file   Intimate partner violence:    Fear of current or ex partner: Not on file    Emotionally abused: Not on file    Physically abused: Not on file    Forced sexual activity: Not on file  Other Topics Concern   Not on file  Social History Narrative   Not on file    Family History:   The patient's family history includes Heart attack in her father; Heart disease in her brother and  sister. There is no history of Coronary artery disease.  ROS:  Please see the history of present illness.  All other ROS reviewed and negative.     Physical Exam/Data:   Vitals:   09/06/18 0200 09/06/18 0400 09/06/18 0700 09/06/18 0847  BP: 117/60 112/66 (!) 108/57 119/62  Pulse: 65 64 64 67  Resp: (!) 26 (!) 22 (!) 22 (!) 23  Temp:  97.7 F (36.5 C)    TempSrc:  Oral    SpO2: 100% 99% 95% 100%  Weight:      Height:        Intake/Output Summary (Last 24 hours) at 09/06/2018 1316 Last data filed at 09/06/2018 1100 Gross per 24 hour  Intake 796.37 ml  Output --  Net 796.37 ml   Last 3 Weights 09/05/2018 05/28/2018 05/22/2018  Weight (lbs) 163 lb 159 lb 9.6 oz 160 lb 14.4 oz  Weight (kg) 73.936 kg 72.394 kg 72.984 kg    Body mass index is 31.83 kg/m.  General: Chronically ill appearing WF in no acute distress. Head: Normocephalic, atraumatic, sclera non-icteric, no xanthomas, nares are without discharge.  Neck: JVD not elevated. Lungs: Clear bilaterally to auscultation without wheezes, rales, or rhonchi. Breathing is unlabored. Heart: RRR with S1 S2. No murmurs, rubs, or gallops appreciated. Abdomen: Soft, non-tender, non-distended with normoactive bowel sounds. No hepatomegaly. No rebound/guarding. No obvious abdominal masses. Msk:  Strength and tone appear normal for age. Extremities: No clubbing or cyanosis. No edema.  Distal pedal pulses are 2+ and equal bilaterally. Neuro: Alert and oriented X 3, slowed cadence of  speech. No facial asymmetry. No focal deficit. Moves all extremities spontaneously. Psych:  Responds to questions appropriately with a normal affect.  EKG:  The EKG was personally reviewed and demonstrates Atrial sensed V paced rhythm 66bpm  Laboratory Data:  Chemistry Recent Labs  Lab 09/05/18 1856 09/06/18 0328  NA 133* 134*  K 4.9 4.4  CL 98 95*  CO2 22 26  GLUCOSE 116* 88  BUN 80* 87*  CREATININE 3.19* 3.22*  CALCIUM 8.4* 8.7*  GFRNONAA 13*  13*  GFRAA 15* 15*  ANIONGAP 13 13    Recent Labs  Lab 09/05/18 1856 09/06/18 0328  PROT 6.6 6.6  ALBUMIN 3.1* 3.1*  AST 33 32  ALT 22 22  ALKPHOS 109 111  BILITOT 1.2 1.2   Hematology Recent Labs  Lab 09/05/18 1856 09/06/18 0328  WBC 7.1 6.8  RBC 3.76* 3.72*  HGB 9.0* 8.9*  HCT 32.2* 31.1*  MCV 85.6 83.6  MCH 23.9* 23.9*  MCHC 28.0* 28.6*  RDW 18.7* 18.9*  PLT 163 183   Cardiac Enzymes Recent Labs  Lab 09/05/18 1856  TROPONINI 1.57*   No results for input(s): TROPIPOC in the last 168 hours.  BNPNo results for input(s): BNP, PROBNP in the last 168 hours.  DDimer No results for input(s): DDIMER in the last 168 hours.  Radiology/Studies:  Dg Chest 1 View  Result Date: 09/05/2018 CLINICAL DATA:  Weakness EXAM: CHEST  1 VIEW COMPARISON:  08/13/2018 FINDINGS: Marked cardiac enlargement. AICD with multiple pacemaker leads some which are disconnected. Negative for heart failure. Mild left lower lobe atelectasis and small left effusion. Right lung clear. IMPRESSION: Marked cardiac enlargement without heart failure Mild left lower lobe atelectasis and small left effusion. Electronically Signed   By: Franchot Gallo M.D.   On: 09/05/2018 18:33    Assessment and Plan:   1. Failure to thrive with poor appetite, weakness and acute kidney injury superimposed on CKD stage IV - being managed by primary team. Diuretic on hold. She appears to be chronically ill with multiple comorbidities with poor longterm outcome.  2. Elevated troponin with known CAD as outlined above - she does not appear to be a candidate for invasive ischemic eval. Stress test would not offer any particular benefit either as she has known structural abnormalities and diffuse CAD. Her blood pressure is a bit soft to consider empiric anti-anginal therapy. Fortunately she is not having much by way of angina. Low dose Imdur could be considered. Ranexa is less ideal given her renal impairment. Will discuss final recs  with MD. DAPT was not previously considered the last time she presented with MI; could be considered going forward although would need demonstration of stabilization of anemia first. Her quality of life seems to be suffering but I am not convinced there is anything that could change from a cardiac perspective to improve her well-being.  3. Chronic combined CHF - diuretics on hold due to AKI - had torsemide and PRN metolazone listed. Not clear how often she was getting metolazone. BP too low to advance HF regimen otherwise.  4. Acute on chronic anemia - per IM.  For questions or updates, please contact [place] Please consult [email or web address] for contact info under Cardiology/STEMI.    Signed, Charlie Pitter, PA-C  09/06/2018 1:16 PM

## 2018-09-07 DIAGNOSIS — Z515 Encounter for palliative care: Secondary | ICD-10-CM

## 2018-09-07 DIAGNOSIS — Z7189 Other specified counseling: Secondary | ICD-10-CM

## 2018-09-07 LAB — BASIC METABOLIC PANEL
Anion gap: 12 (ref 5–15)
BUN: 87 mg/dL — AB (ref 8–23)
CO2: 24 mmol/L (ref 22–32)
Calcium: 8.6 mg/dL — ABNORMAL LOW (ref 8.9–10.3)
Chloride: 99 mmol/L (ref 98–111)
Creatinine, Ser: 2.94 mg/dL — ABNORMAL HIGH (ref 0.44–1.00)
GFR calc Af Amer: 16 mL/min — ABNORMAL LOW (ref >60)
GFR calc non Af Amer: 14 mL/min — ABNORMAL LOW (ref >60)
Glucose, Bld: 105 mg/dL — ABNORMAL HIGH (ref 70–99)
Potassium: 4.4 mmol/L (ref 3.5–5.1)
Sodium: 135 mmol/L (ref 135–145)

## 2018-09-07 LAB — GLUCOSE, CAPILLARY
GLUCOSE-CAPILLARY: 141 mg/dL — AB (ref 70–99)
Glucose-Capillary: 51 mg/dL — ABNORMAL LOW (ref 70–99)
Glucose-Capillary: 179 mg/dL — ABNORMAL HIGH (ref 70–99)
Glucose-Capillary: 111 mg/dL — ABNORMAL HIGH (ref 70–99)
Glucose-Capillary: 79 mg/dL (ref 70–99)
Glucose-Capillary: 195 mg/dL — ABNORMAL HIGH (ref 70–99)

## 2018-09-07 LAB — CBC
HCT: 30.5 % — ABNORMAL LOW (ref 36.0–46.0)
Hemoglobin: 8.5 g/dL — ABNORMAL LOW (ref 12.0–15.0)
MCH: 23.2 pg — AB (ref 26.0–34.0)
MCHC: 27.9 g/dL — ABNORMAL LOW (ref 30.0–36.0)
MCV: 83.3 fL (ref 80.0–100.0)
PLATELETS: 174 10*3/uL (ref 150–400)
RBC: 3.66 MIL/uL — ABNORMAL LOW (ref 3.87–5.11)
RDW: 19.3 % — ABNORMAL HIGH (ref 11.5–15.5)
WBC: 6 10*3/uL (ref 4.0–10.5)
nRBC: 0 % (ref 0.0–0.2)

## 2018-09-07 MED ORDER — DEXTROSE 50 % IV SOLN: 25.0000 g | INTRAVENOUS | Status: AC

## 2018-09-07 MED ORDER — INSULIN GLARGINE 100 UNIT/ML ~~LOC~~ SOLN
15.0000 [IU] | Freq: Every day | SUBCUTANEOUS | Status: DC
  Filled 2018-09-07: qty 0.15

## 2018-09-07 MED ORDER — DEXTROSE 50 % IV SOLN
INTRAVENOUS | Status: AC
  Administered 2018-09-07: 50 mL
  Filled 2018-09-07: qty 50

## 2018-09-07 MED ORDER — INSULIN GLARGINE 100 UNIT/ML ~~LOC~~ SOLN
12.0000 [IU] | Freq: Every day | SUBCUTANEOUS | Status: DC
  Administered 2018-09-07 – 2018-09-08 (×2): 12 [IU] via SUBCUTANEOUS
  Filled 2018-09-07 (×3): qty 0.12

## 2018-09-07 NOTE — Progress Notes (Signed)
Hypoglycemic Event  CBG: 51  Treatment: 91ml d50  Symptoms: none  Follow-up CBG: Time 0647 CBG Result:179  Possible Reasons for Event: Reduce intake  Comments/MD notified:protocol    Amo Kuffour, Trilby Drummer

## 2018-09-07 NOTE — Progress Notes (Signed)
Physical Therapy Evaluation Patient Details Name: [name] MRN: [id number] DOB: April 25, 1934 Today's Date: 09/07/2018   History of Present Illness  Patient is 85 y/o female admitted to hospital secondary to acute on chronic renal failure. Patient with elevated troponin however per notes cardiac states limited treatment options and has signed off. 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  Palliative on board.   Clinical Impression  Patient admitted to hospital secondary to problems above and with deficits below. Patient required min-modA to stand with RW x2. Further mobility limited secondary to fatigue. Patient reports plan is to return to SNF. Given functional mobility deficits, feel return to SNF is appropriate following d/c. Patient will benefit from acute physical therapy to maximize independence and safety with functional mobility.     Follow Up Recommendations SNF;Supervision/Assistance - 24 hour    Equipment Recommendations  Other (comment)(TBD at next venue)    Recommendations for Other Services       Precautions / Restrictions Precautions Precautions: Fall Restrictions Weight Bearing Restrictions: No      Mobility  Bed Mobility Overal bed mobility: Needs Assistance Bed Mobility: Sit to Supine;Rolling Rolling: Supervision     Sit to supine: Min assist   General bed mobility comments: Patient sitting EOB upon arrival. Patient able to scoot self up towards [place] while seated prior to returning to supine. Required minA for LE management to return to supine. Patient able to roll to L and R with use of bed rails and supervision for safety for removal of back gown.   Transfers Overall transfer level: Needs assistance Equipment used: Rolling walker (2 wheeled) Transfers: Sit to/from Stand Sit to Stand: Min assist;Mod assist         General transfer comment:  Patient required min-modA for lift assist to stand with RW x2. Patient with mild unsteadiness in standing requiring close min guard-minA for steadying. Verbal cues for hand placement prior to standing with RW. Patient with increased time and effort to stand. Further mobility deferred secondary to fatigue.   Ambulation/Gait                Stairs            Wheelchair Mobility    Modified Rankin (Stroke Patients Only)       Balance Overall balance assessment: Needs assistance Sitting-balance support: Feet supported;No upper extremity supported Sitting balance-Leahy Scale: Good     Standing balance support: Bilateral upper extremity supported Standing balance-Leahy Scale: Poor Standing balance comment: reliant on BUE support to maintain standing balance                             Pertinent Vitals/Pain Pain Assessment: Faces Faces Pain Scale: No hurt    Home Living Family/patient expects to be discharged to:: Skilled nursing facility                      Prior Function Level of Independence: Needs assistance   Gait / Transfers Assistance Needed: reports using a RW for all mobility. States she stopped walking a week ago secondary to weakness  ADL's / Homemaking Assistance Needed: requires assistance for ADLs        Hand Dominance        Extremity/Trunk Assessment   Upper Extremity Assessment Upper Extremity Assessment: Generalized weakness    Lower Extremity Assessment  Lower Extremity Assessment: Generalized weakness    Cervical / Trunk Assessment Cervical / Trunk Assessment: Normal  Communication   Communication: No difficulties  Cognition Arousal/Alertness: Awake/alert Behavior During Therapy: WFL for tasks assessed/performed Overall Cognitive Status: No family/caregiver present to determine baseline cognitive functioning                                        General Comments General comments (skin  integrity, edema, etc.): Patient reports plan is to return to SNF    Exercises     Assessment/Plan    PT Assessment Patient needs continued PT services  PT Problem List Decreased strength;Decreased activity tolerance;Decreased balance;Decreased mobility;Decreased knowledge of use of DME       PT Treatment Interventions DME instruction;Gait training;Functional mobility training;Therapeutic activities;Therapeutic exercise;Balance training;Patient/family education    PT Goals (Current goals can be found in the Care Plan section)  Acute Rehab PT Goals Patient Stated Goal: "I don't want to be like this" PT Goal Formulation: With patient Time For Goal Achievement: 09/21/18 Potential to Achieve Goals: Fair    Frequency Min 2X/week   Barriers to discharge        Co-evaluation               AM-PAC PT "6 Clicks" Mobility  Outcome Measure Help needed turning from your back to your side while in a flat bed without using bedrails?: A Little Help needed moving from lying on your back to sitting on the side of a flat bed without using bedrails?: A Little Help needed moving to and from a bed to a chair (including a wheelchair)?: A Lot Help needed standing up from a chair using your arms (e.g., wheelchair or bedside chair)?: A Lot Help needed to walk in hospital room?: Total Help needed climbing 3-5 steps with a railing? : Total 6 Click Score: 12    End of Session Equipment Utilized During Treatment: Gait belt Activity Tolerance: Patient limited by fatigue Patient left: in bed;with call bell/phone within reach;with bed alarm set Nurse Communication: Mobility status PT Visit Diagnosis: Unsteadiness on feet (R26.81);Other abnormalities of gait and mobility (R26.89);Muscle weakness (generalized) (M62.81);Difficulty in walking, not elsewhere classified (R26.2)    Time: 7408-1448 PT Time Calculation (min) (ACUTE ONLY): 24 min   Charges:   PT Evaluation $PT Eval Moderate  Complexity: 1 Mod PT Treatments $Therapeutic Activity: 8-22 mins        Erick Blinks, SPT  Erick Blinks 09/07/2018, 6:18 PM

## 2018-09-07 NOTE — Plan of Care (Signed)

## 2018-09-07 NOTE — Progress Notes (Signed)
PROGRESS NOTE    [name]  QPY:[id number] DOB: [birth year] DOA: 09/05/2018 PCP: Loman Brooklyn, FNP    Brief Narrative: 85 year old with past medical history significant for chronic kidney disease 3 last creatinine last discharge from the hospital in November was around 2.2.  Patient presents with generalized weakness, she was not able to ambulate or get up.  She at some point reports some shortness of breath. On my evaluation today she currently denies chest pain or shortness of breath.  She is complaining of her neuropathy pain lower extremity. Patient admitted with acute on chronic renal failure, urinary retention and elevation of troponin.   Assessment & Plan:   Principal Problem:   Acute on chronic renal failure (HCC) Active Problems:   HLD (hyperlipidemia)   Coronary atherosclerosis of native coronary artery   SYSTOLIC HEART FAILURE, CHRONIC   Type 2 diabetes mellitus (HCC)   Biventricular implantable cardioverter-defibrillator in situ   Weakness generalized   Elevated troponin I level   Renal failure (ARF), acute on chronic (HCC)   Estimated body mass index is 31.83 kg/m as calculated from the following:   Height as of this encounter: 5' (1.524 m).   Weight as of this encounter: 73.9 kg.  1-Acute on chronic renal failure stage III; creatinine per records around 2.2. Patient presented with a creatinine at 0.1, BUN at 80. Patient noticed to have urine retention this morning.  Foley catheter placed. Continue with gentle hydration. Will check renal ultrasound. Per prior discharge summary she was deemed not a candidate for dialysis. Cr trending down. Will stop IV fluids to avoid overload.  Component of obstructive uropathy.   2-Elevation of troponin; in the setting of renal failure.  But there was also some report of chest pain at some point and shortness of breath.  Cardiology has been consulted. No further evaluation per cardiology  3-Anemia probably of  chronic disease.  Monitor hemoglobin.  4-Chronic systolic heart failure; Hold torsemide in the setting of AKI. Monitor for volume overload. Continue with carvedilol. Continue with aspirin.  5-Constipation: Patient reported last bowel movement was done a week ago.  Will check a KUB. She will need suppository if KUB negative for obstruction. Had multiples BM.   6-Diabetes type 2; She had an episode of hypoglycemia blood sugar in the 60s.  I will add chills insulin regimen.  Subsequently her blood sugar increased to 300. Adding a scale insulin Decreased lantus due to hypoglycemia.   7-Generalized weakness PT evaluation.  8-Chronic pain neuropathy.  Continue with low-dose Vicodin   DVT prophylaxis: Heparin. Code Status: DNR Family Communication: son does not answer phone. Palliative care consulted Disposition Plan: Remain in the hospital for treatment of renal failure.  Generalized weakness.   Consultants:  Cardiology  Procedures: (  None   Antimicrobials:   None   Subjective: She was sitting in bed, eating breakfast. Per nurse patient has been more confuse.  Denies abdominal pain today  Objective: Vitals:   09/07/18 0330 09/07/18 0426 09/07/18 0723 09/07/18 1200  BP:  (!) 102/47 (!) 100/52 (!) 98/51  Pulse: (!) 59  61 (!) 59  Resp: (!) 25  19 (!) 22  Temp:   97.8 F (36.6 C) 97.7 F (36.5 C)  TempSrc:  Oral Oral Oral  SpO2: 100% 100% 100% 99%  Weight:      Height:        Intake/Output Summary (Last 24 hours) at 09/07/2018 1209 Last data filed at 09/07/2018 1200 Gross per 24  hour  Intake 1825.47 ml  Output 1300 ml  Net 525.47 ml   Filed Weights   09/05/18 1749  Weight: 73.9 kg    Examination:  General exam: NAD Respiratory system: crackles bases Cardiovascular system:  S 1, S 2 RRR Gastrointestinal system: BS present, soft, nt Central nervous system: alert Extremities: symmetric power.   Data Reviewed: I have personally reviewed following  labs and imaging studies  CBC: Recent Labs  Lab 09/05/18 1856 09/06/18 0328 09/07/18 0743  WBC 7.1 6.8 6.0  NEUTROABS 5.1  --   --   HGB 9.0* 8.9* 8.5*  HCT 32.2* 31.1* 30.5*  MCV 85.6 83.6 83.3  PLT 163 183 277   Basic Metabolic Panel: Recent Labs  Lab 09/05/18 1856 09/06/18 0328 09/07/18 0743  NA 133* 134* 135  K 4.9 4.4 4.4  CL 98 95* 99  CO2 22 26 24   GLUCOSE 116* 88 105*  BUN 80* 87* 87*  CREATININE 3.19* 3.22* 2.94*  CALCIUM 8.4* 8.7* 8.6*   GFR: Estimated Creatinine Clearance: 13.3 mL/min (A) (by C-G formula based on SCr of 2.94 mg/dL (H)). Liver Function Tests: Recent Labs  Lab 09/05/18 1856 09/06/18 0328  AST 33 32  ALT 22 22  ALKPHOS 109 111  BILITOT 1.2 1.2  PROT 6.6 6.6  ALBUMIN 3.1* 3.1*   No results for input(s): LIPASE, AMYLASE in the last 168 hours. No results for input(s): AMMONIA in the last 168 hours. Coagulation Profile: No results for input(s): INR, PROTIME in the last 168 hours. Cardiac Enzymes: Recent Labs  Lab 09/05/18 1856  TROPONINI 1.57*   BNP (last 3 results) No results for input(s): PROBNP in the last 8760 hours. HbA1C: No results for input(s): HGBA1C in the last 72 hours. CBG: Recent Labs  Lab 09/06/18 2121 09/07/18 0622 09/07/18 0647 09/07/18 0750 09/07/18 1137  GLUCAP 91 51* 179* 111* 79   Lipid Profile: No results for input(s): CHOL, HDL, LDLCALC, TRIG, CHOLHDL, LDLDIRECT in the last 72 hours. Thyroid Function Tests: No results for input(s): TSH, T4TOTAL, FREET4, T3FREE, THYROIDAB in the last 72 hours. Anemia Panel: No results for input(s): VITAMINB12, FOLATE, FERRITIN, TIBC, IRON, RETICCTPCT in the last 72 hours. Sepsis Labs: No results for input(s): PROCALCITON, LATICACIDVEN in the last 168 hours.  Recent Results (from the past 240 hour(s))  MRSA PCR Screening     Status: None   Collection Time: 09/06/18  8:52 PM  Result Value Ref Range Status   MRSA by PCR NEGATIVE NEGATIVE Final    Comment:         The GeneXpert MRSA Assay (FDA approved for NASAL specimens only), is one component of a comprehensive MRSA colonization surveillance program. It is not intended to diagnose MRSA infection nor to guide or monitor treatment for MRSA infections. Performed at [facility], [place] [address] [place], [place] [address]          Radiology Studies: Dg Chest 1 View  Result Date: 09/05/2018 CLINICAL DATA:  Weakness EXAM: CHEST  1 VIEW COMPARISON:  08/13/2018 FINDINGS: Marked cardiac enlargement. AICD with multiple pacemaker leads some which are disconnected. Negative for heart failure. Mild left lower lobe atelectasis and small left effusion. Right lung clear. IMPRESSION: Marked cardiac enlargement without heart failure Mild left lower lobe atelectasis and small left effusion. Electronically Signed   By: Franchot Gallo M.D.   On: 09/05/2018 18:33   Dg Abd 1 View  Result Date: 09/06/2018 CLINICAL DATA:  Abdominal pain. EXAM: ABDOMEN - 1  VIEW COMPARISON:  Radiograph of October 01, 2017. FINDINGS: The bowel gas pattern is normal. Status post cholecystectomy. No radio-opaque calculi or other significant radiographic abnormality are seen. IMPRESSION: No evidence of bowel obstruction or ileus. Electronically Signed   By: Marijo Conception, M.D.   On: 09/06/2018 16:16   US Renal  Result Date: 09/06/2018 CLINICAL DATA:  85 year old female with chronic kidney disease. Head and neck cancer. EXAM: RENAL / URINARY TRACT ULTRASOUND COMPLETE COMPARISON:  PET-CT 05/17/2018. FINDINGS: Right Kidney: Renal measurements: 11.0 x 4.3 x 3.6 centimeters = volume: 90 mL. Borderline to mild increased cortical echogenicity (image 20). No mass or hydronephrosis visualized. Left Kidney: Renal measurements: 10.1 x 4.8 x 5.5 centimeters = volume: 140 mL. Echogenicity within normal limits. No mass or hydronephrosis visualized. Bladder: Diminutive, by report Foley catheter is in place. IMPRESSION: 1. No acute renal findings. 2.  Borderline to mild increased renal cortical echogenicity compatible with a degree of chronic medical renal disease. Electronically Signed   By: Genevie Ann M.D.   On: 09/06/2018 19:56        Scheduled Meds: . allopurinol  100 mg Oral Daily  . aspirin  81 mg Oral QHS  . carvedilol  3.125 mg Oral BID WC  . citalopram  20 mg Oral Daily  . dextrose  25 g Intravenous STAT  . famotidine  20 mg Oral Daily  . gabapentin  100 mg Oral BID  . heparin  5,000 Units Subcutaneous Q8H  . insulin aspart  0-9 Units Subcutaneous TID WC  . insulin glargine  12 Units Subcutaneous Daily  . lidocaine  15 mL Mouth/Throat Q4H while awake  . polyethylene glycol  17 g Oral BID  . cyanocobalamin  2,000 mcg Oral Daily  . vitamin C  500 mg Oral Daily   Continuous Infusions:    LOS: 2 days    Time spent: 35 minutes    Elmarie Shiley, MD [facility] Pager [phone]  If 7PM-7AM, please contact night-coverage [email or web address] Password [email or web address] 09/07/2018, 12:09 PM

## 2018-09-07 NOTE — Consult Note (Signed)
Consultation Note Date: 09/07/2018   Patient Name: [name]  DOB: [birth year]  MRN: [id number]  Age / Sex: 85 y.o., female  PCP: Loman Brooklyn, FNP Referring Physician: Elmarie Shiley, MD  Reason for Consultation: Establishing goals of care  HPI/Patient Profile: 85 y.o. 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 Found to have acute on chronic renal failure and elevated troponin. Patient is not a candidate for dialysis. Diuretics are being held in setting of AKI. PMT consulted for [place].  Clinical Assessment and Goals of Care: I have reviewed medical records including EPIC notes, labs and imaging, received report from Dr. Tyrell Antonio, assessed the patient and then met with patient  to discuss diagnosis prognosis, GOC, EOL wishes, disposition and options.  I introduced Palliative Medicine as specialized medical care for people living with serious illness. It focuses on providing relief from the symptoms and stress of a serious illness. The goal is to improve quality of life for both the patient and the family.  Conversation with patient was difficult as she was drowsy and frequently drifted off to sleep. When she participated in conversation she appeared to be oriented.   We discussed her clinical condition and she tells me she understands her kidneys aren't working well. She tells me she did not know anything was wrong with her heart so we discussed her cardiac issues. I shared my concerns with her about her care moving forward. She tells me it is most important to her to go home. I shared my concerns about her returning home by herself. She tells me she only needs a little extra help and hopes for home health. I brought up the  idea of hospice to which she nods but does not respond. I asked for permission to call her son and she agreed.  Attempted to call patient's son ,Ronalee Belts. He did not answer. Left voice message.  Primary Decision Maker PATIENT - unsure if patient has decision making capacity - son would be decision maker if patient unable  SUMMARY OF RECOMMENDATIONS   Patient unable to fully participate in [place] discussion d/t lethargy - discussed concerns about her illness and discussed hospice  Son did not answer or return call Per chart review, appears plan is for patient to return to rehab - suggest outpatient palliative at rehab - **MD please write for this in discharge summary**  Code Status/Advance Care Planning:  DNR  Symptom Management:   Per primary  Palliative Prophylaxis:   Aspiration, Frequent Pain Assessment and Turn Reposition  Additional Recommendations (Limitations, Scope, Preferences):  Full Scope Treatment  Psycho-social/Spiritual:   Desire for further Chaplaincy support:no  Additional Recommendations: Education on Hospice  Prognosis:   Unable to determine  Discharge Planning: [place] for rehab with Palliative care service follow-up      Primary Diagnoses: Present on Admission: . Renal failure (ARF), acute on chronic ([place]) . Coronary atherosclerosis of native coronary artery . HLD (hyperlipidemia) . SYSTOLIC HEART FAILURE, CHRONIC . Biventricular implantable cardioverter-defibrillator in situ . Acute on chronic renal failure ([place]) . Elevated troponin I level   I have reviewed the medical record, interviewed the patient and family, and examined the patient. The following aspects are pertinent.  Past Medical History:  Diagnosis Date  . Anemia   . Aneurysm ([place])  Apical; previously on Coumadin  . Anxiety disorder   . Cancer (HCC)    Skin  . Chronic combined systolic and diastolic CHF (congestive heart failure) ([place])   . CKD (chronic kidney  disease), stage IV ([place])   . Coronary artery disease    a. prior bare metal stenting of the LAD and RCA around 2011. b. Cath 02/15/12 revealed RCA as culprit lesion w/ occlusion at site of prior stent, patent LAD stent, chronic LCx and Diagonal dz; Medical therapy. c. NSTEMI 04/2018 -> med rx.  . Depression   . GERD (gastroesophageal reflux disease)   . Hyperlipidemia    H/o GI intolerance to Lipitor  . ICD (implantable cardioverter-defibrillator) malfunction    a. h/o RV lead failure - ICD therapies turned off 01/2018 after pt elected DNR/DNI.  [place] Kitchen Impingement syndrome of left shoulder   . Ischemic cardiomyopathy   . Lesion of hard palate   . Peripheral polyneuropathy    Refractory to several medications  . Type 2 diabetes mellitus ([place])    Social History   Socioeconomic History  . Marital status: Widowed    Spouse name: Not on file  . Number of children: Not on file  . Years of education: Not on file  . Highest education level: Not on file  Occupational History  . Occupation: Retired    Fish farm manager: RETIRED  Social Needs  . Financial resource strain: Not on file  . Food insecurity:    Worry: Not on file    Inability: Not on file  . Transportation needs:    Medical: Not on file    Non-medical: Not on file  Tobacco Use  . Smoking status: Former Smoker    Packs/day: 0.80    Years: 30.00    Pack years: 24.00    Types: Cigarettes    Start date: 04/05/1957    Last attempt to quit: 06/27/2002    Years since quitting: 16.2  . Smokeless tobacco: Never Used  Substance and Sexual Activity  . Alcohol use: No    Alcohol/week: 0.0 standard drinks  . Drug use: No  . Sexual activity: Not Currently  Lifestyle  . Physical activity:    Days per week: Not on file    Minutes per session: Not on file  . Stress: Not on file  Relationships  . Social connections:    Talks on phone: Not on file    Gets together: Not on file    Attends religious service: Not on file    Active member of club  or organization: Not on file    Attends meetings of clubs or organizations: Not on file    Relationship status: Not on file  Other Topics Concern  . Not on file  Social History Narrative  . Not on file   Family History  Problem Relation Age of Onset  . Heart attack Father   . Heart disease Sister   . Heart disease Brother   . Coronary artery disease Neg Hx    Scheduled Meds: . allopurinol  100 mg Oral Daily  . aspirin  81 mg Oral QHS  . carvedilol  3.125 mg Oral BID WC  . citalopram  20 mg Oral Daily  . dextrose  25 g Intravenous STAT  . famotidine  20 mg Oral Daily  . gabapentin  100 mg Oral BID  . heparin  5,000 Units Subcutaneous Q8H  . insulin aspart  0-9 Units Subcutaneous TID WC  . insulin glargine  12 Units  Subcutaneous Daily  . lidocaine  15 mL Mouth/Throat Q4H while awake  . polyethylene glycol  17 g Oral BID  . cyanocobalamin  2,000 mcg Oral Daily  . vitamin C  500 mg Oral Daily   Continuous Infusions: PRN Meds:.albuterol, ALPRAZolam, colchicine, HYDROcodone-acetaminophen, nitroGLYCERIN, ondansetron **OR** ondansetron (ZOFRAN) IV Allergies  Allergen Reactions  . Augmentin [Amoxicillin-Pot Clavulanate] Other (See Comments)    Abdominal pain  . Metformin Other (See Comments)    Upset stomach  . Metolazone Other (See Comments)    "made everything hurt" 07/03/15  . Sulfonamide Derivatives Other (See Comments)    swelling   Review of Systems  Unable to perform ROS: Mental status change    Physical Exam Constitutional:      General: She is not in acute distress.    Appearance: She is ill-appearing.     Interventions: Nasal cannula in place.  HENT:     Head: Normocephalic and atraumatic.  Cardiovascular:     Rate and Rhythm: Normal rate and regular rhythm.  Pulmonary:     Effort: Pulmonary effort is normal.     Breath sounds: Normal breath sounds.  Abdominal:     Palpations: Abdomen is soft.  Skin:    General: Skin is warm and dry.  Neurological:      Mental Status: She is oriented to person, place, and time. She is lethargic.     Vital Signs: BP (!) 98/51 (BP Location: Left Arm)   Pulse (!) 59   Temp 97.7 F (36.5 C) (Oral)   Resp (!) 22   Ht 5' (1.524 m)   Wt 73.9 kg   SpO2 99%   BMI 31.83 kg/m  Pain Scale: 0-10 POSS *See Group Information*: 1-Acceptable,Awake and alert Pain Score: 0-No pain   SpO2: SpO2: 99 % O2 Device:SpO2: 99 % O2 Flow Rate: .O2 Flow Rate (L/min): 2 L/min  IO: Intake/output summary:   Intake/Output Summary (Last 24 hours) at 09/07/2018 1437 Last data filed at 09/07/2018 1200 Gross per 24 hour  Intake 1825.47 ml  Output 650 ml  Net 1175.47 ml    LBM: Last BM Date: 09/06/18 Baseline Weight: Weight: 73.9 kg Most recent weight: Weight: 73.9 kg     Palliative Assessment/Data: UTA    Time Total: 50 minutes Greater than 50%  of this time was spent counseling and coordinating care related to the above assessment and plan.  Juel Burrow, DNP, AGNP-C Palliative Medicine Team [phone] Pager: [phone]

## 2018-09-07 NOTE — TOC Initial Note (Signed)
Transition of Care [facility]) - Initial/Assessment Note    Patient Details  Name: [name] MRN: [id number] Date of Birth: 14-Dec-1933  Transition of Care [facility]) CM/SW Contact:    Candie Chroman, LCSW Phone Number: 09/07/2018, 12:58 PM  Clinical Narrative: CSW met with patient. No supports at bedside. CSW introduced role and explained that discharge planning would be discussed. Patient lethargic but able to have brief conversation. She confirmed she is a short-term rehab resident at [facility] and plans to return at discharge. Patient has been at the SNF since 2/18 until admission on 3/11 (22 days). Patient is in copay days without a secondary insurance to cover approximately $160 per day. Patient was not comprehending this in her lethargic state so she gave CSW permission to call her son, Ronalee Belts. Left him a voicemail. No further concerns. CSW encouraged patient to contact CSW as needed. CSW will continue to follow patient for support and facilitate discharge back to SNF once medically stable.      Expected Discharge Plan: Skilled Nursing Facility Barriers to Discharge: Continued Medical Work up, Ship broker   Patient Goals and CMS Choice        Expected Discharge Plan and Services Expected Discharge Plan: [facility] Acute Care Choice: [place] arrangements for the past 2 months: Single Family Home                          Prior Living Arrangements/Services Living arrangements for the past 2 months: Single Family Home   Patient language and need for interpreter reviewed:: No Do you feel safe going back to the place where you live?: Yes      Need for Family Participation in Patient Care: Yes (Comment)     Criminal Activity/Legal Involvement Pertinent to Current Situation/Hospitalization: No - Comment as needed  Activities of Daily Living Home Assistive Devices/Equipment: Eyeglasses, Other (Comment)(pt from  SNF has access to aide devices) ADL Screening (condition at time of admission) Patient's cognitive ability adequate to safely complete daily activities?: Yes Is the patient deaf or have difficulty hearing?: No Does the patient have difficulty seeing, even when wearing glasses/contacts?: No Does the patient have difficulty concentrating, remembering, or making decisions?: No Patient able to express need for assistance with ADLs?: Yes Does the patient have difficulty dressing or bathing?: Yes Independently performs ADLs?: No Does the patient have difficulty walking or climbing stairs?: Yes Weakness of Legs: Both Weakness of Arms/Hands: None  Permission Sought/Granted Permission sought to share information with : Facility Sport and exercise psychologist, Family Supports Permission granted to share information with : Yes, Verbal Permission Granted  Share Information with NAME: Monnica Saltsman  Permission granted to share info w AGENCY: [facility]  Permission granted to share info w Relationship: Son  Permission granted to share info w Contact Information: [phone]  Emotional Assessment Appearance:: Appears stated age Attitude/Demeanor/Rapport: Lethargic Affect (typically observed): Accepting Orientation: : Oriented to Self, Oriented to Place, Oriented to  Time, Oriented to Situation Alcohol / Substance Use: Never Used Psych Involvement: No (comment)  Admission diagnosis:  Weakness [R53.1] Elevated troponin [R79.89] Acute on chronic renal insufficiency [N28.9, N18.9] Patient Active Problem List   Diagnosis Date Noted  . Renal failure (ARF), acute on chronic (HCC) 09/05/2018  . SOB (shortness of breath) 05/19/2018  . Chest pain 05/19/2018  . Abnormal LFTs 05/19/2018  . CHF exacerbation ([place]) 11/27/2017  . Elevated troponin   . Chronic  pain syndrome 06/05/2015  . Thrombocytopenia ([place]) 06/05/2015  . Weakness generalized 06/04/2015  . Elevated troponin I level 06/04/2015  .  Automatic implantable cardiac defibrillator in situ 07/22/2013  . Acute on chronic systolic heart failure ([place]) 11/24/2012  . Acute on chronic renal failure ([place]) 11/24/2012  . Hypoxemia 04/02/2012  . Stage III chronic kidney disease ([place]) 04/02/2012  . Non-ST elevation (NSTEMI) myocardial infarction ([place]) 02/19/2012  . Statin intolerance   . Biventricular implantable cardioverter-defibrillator in situ   . CAD (coronary artery disease)   . Ischemic cardiomyopathy   . Type 2 diabetes mellitus ([place]) 02/14/2011  . Shortness of breath 01/19/2010  . Coronary atherosclerosis of native coronary artery 07/03/2009  . HLD (hyperlipidemia) 04/12/2009  . SYSTOLIC HEART FAILURE, CHRONIC 04/12/2009   PCP:  Loman Brooklyn, FNP Pharmacy:  No Pharmacies Listed    Social Determinants of Health (SDOH) Interventions    Readmission Risk Interventions 30 Day Unplanned Readmission Risk Score     ED to Hosp-Admission (Current) from 09/05/2018 in [place] CV PROGRESSIVE CARE  30 Day Unplanned Readmission Risk Score (%)  22 Filed at 09/07/2018 1200     This score is the patient's risk of an unplanned readmission within 30 days of being discharged (0 -100%). The score is based on dignosis, age, lab data, medications, orders, and past utilization.   Low:  0-14.9   Medium: 15-21.9   High: 22-29.9   Extreme: 30 and above       No flowsheet data found.

## 2018-09-08 LAB — BASIC METABOLIC PANEL
Anion gap: 13 (ref 5–15)
BUN: 87 mg/dL — AB (ref 8–23)
CO2: 25 mmol/L (ref 22–32)
Calcium: 9.2 mg/dL (ref 8.9–10.3)
Chloride: 96 mmol/L — ABNORMAL LOW (ref 98–111)
Creatinine, Ser: 2.48 mg/dL — ABNORMAL HIGH (ref 0.44–1.00)
GFR calc Af Amer: 20 mL/min — ABNORMAL LOW (ref >60)
GFR calc non Af Amer: 17 mL/min — ABNORMAL LOW (ref >60)
Glucose, Bld: 118 mg/dL — ABNORMAL HIGH (ref 70–99)
Potassium: 4.2 mmol/L (ref 3.5–5.1)
Sodium: 134 mmol/L — ABNORMAL LOW (ref 135–145)

## 2018-09-08 LAB — GLUCOSE, CAPILLARY
Glucose-Capillary: 97 mg/dL (ref 70–99)
Glucose-Capillary: 109 mg/dL — ABNORMAL HIGH (ref 70–99)
Glucose-Capillary: 211 mg/dL — ABNORMAL HIGH (ref 70–99)
Glucose-Capillary: 199 mg/dL — ABNORMAL HIGH (ref 70–99)

## 2018-09-08 LAB — CBC
HCT: 32.9 % — ABNORMAL LOW (ref 36.0–46.0)
Hemoglobin: 9.3 g/dL — ABNORMAL LOW (ref 12.0–15.0)
MCH: 24 pg — ABNORMAL LOW (ref 26.0–34.0)
MCHC: 28.3 g/dL — ABNORMAL LOW (ref 30.0–36.0)
MCV: 84.8 fL (ref 80.0–100.0)
Platelets: 185 10*3/uL (ref 150–400)
RBC: 3.88 MIL/uL (ref 3.87–5.11)
RDW: 20 % — ABNORMAL HIGH (ref 11.5–15.5)
WBC: 5.9 10*3/uL (ref 4.0–10.5)
nRBC: 0 % (ref 0.0–0.2)

## 2018-09-08 LAB — MAGNESIUM: Magnesium: 2.6 mg/dL — ABNORMAL HIGH (ref 1.7–2.4)

## 2018-09-08 MED ORDER — COLCHICINE 0.6 MG PO TABS: 0.6000 mg | ORAL_TABLET | Freq: Every day | ORAL | Status: DC

## 2018-09-08 MED ORDER — ALUM & MAG HYDROXIDE-SIMETH 200-200-20 MG/5ML PO SUSP
15.0000 mL | Freq: Once | ORAL | Status: AC
  Administered 2018-09-08: 15 mL via ORAL
  Filled 2018-09-08: qty 30

## 2018-09-08 MED ORDER — FAMOTIDINE 20 MG PO TABS
20.0000 mg | ORAL_TABLET | Freq: Two times a day (BID) | ORAL | Status: DC
  Administered 2018-09-08 – 2018-09-11 (×6): 20 mg via ORAL
  Filled 2018-09-08 (×6): qty 1

## 2018-09-08 MED ORDER — COLCHICINE 0.6 MG PO TABS: 0.6000 mg | ORAL_TABLET | Freq: Every day | ORAL | Status: DC | PRN

## 2018-09-08 MED ORDER — BISACODYL 10 MG RE SUPP
10.0000 mg | Freq: Once | RECTAL | Status: AC
  Administered 2018-09-08: 10 mg via RECTAL
  Filled 2018-09-08: qty 1

## 2018-09-08 NOTE — Plan of Care (Signed)

## 2018-09-08 NOTE — Progress Notes (Signed)
Paged dr Tyrell Antonio regarding pt c/o lower abdominal pain. Noticed ekg changes 12 lead done. New orders rec'd will continue to monitor.

## 2018-09-08 NOTE — Progress Notes (Signed)
PROGRESS NOTE    [name]  QZE:[id number] DOB: [birth year] DOA: 09/05/2018 PCP: Loman Brooklyn, FNP    Brief Narrative: 85 year old with past medical history significant for chronic kidney disease 3 last creatinine last discharge from the hospital in November was around 2.2.  Patient presents with generalized weakness, she was not able to ambulate or get up.  She at some point reports some shortness of breath. On my evaluation today she currently denies chest pain or shortness of breath.  She is complaining of her neuropathy pain lower extremity. Patient admitted with acute on chronic renal failure, urinary retention and elevation of troponin.   Assessment & Plan:   Principal Problem:   Acute on chronic renal failure (HCC) Active Problems:   HLD (hyperlipidemia)   Coronary atherosclerosis of native coronary artery   SYSTOLIC HEART FAILURE, CHRONIC   Type 2 diabetes mellitus (HCC)   Biventricular implantable cardioverter-defibrillator in situ   Weakness generalized   Elevated troponin I level   Renal failure (ARF), acute on chronic (HCC)   Goals of care, counseling/discussion   Palliative care by specialist   Estimated body mass index is 31.83 kg/m as calculated from the following:   Height as of this encounter: 5' (1.524 m).   Weight as of this encounter: 73.9 kg.  1-Acute on chronic renal failure stage III; creatinine per records around 2.2. Patient presented with a creatinine at 0.1, BUN at 80. Patient noticed to have urine retention this morning.  Foley catheter placed. Continue with gentle hydration. Will check renal ultrasound. Per prior discharge summary she was deemed not a candidate for dialysis. Cr trending down.  stop IV fluids to avoid overload.  Component of obstructive uropathy.  CR today is 2.4.  2-Elevation of troponin; in the setting of renal failure.  But there was also some report of chest pain at some point and shortness of breath.   Cardiology has been consulted. No further evaluation per cardiology  3-Anemia probably of chronic disease.  Monitor hemoglobin. Hemoglobin stable at 9.3.  4-Chronic systolic heart failure; Hold torsemide in the setting of AKI. Monitor for volume overload. Continue with carvedilol. Continue with aspirin.  5-Constipation: Patient reported last bowel movement was done a week ago.   KUB negative for obstruction Had multiples BM.   6-Diabetes type 2; She had an episode of hypoglycemia blood sugar in the 60s.  I will add chills insulin regimen.  Subsequently her blood sugar increased to 300. Adding a scale insulin Decreased lantus due to hypoglycemia.   7-Generalized weakness PT evaluation.  8-Chronic pain neuropathy.  Continue with low-dose Vicodin   DVT prophylaxis: Heparin. Code Status: DNR Family Communication: Discussed case with son.  Palliative care consulted Disposition Plan; awaiting palliative care meeting with son.   Consultants:  Cardiology  Procedures: (  None   Antimicrobials:   None   Subjective: She is alert, still complaining of her chronic lower extremity pain.   Objective: Vitals:   09/07/18 2337 09/08/18 0310 09/08/18 0755 09/08/18 1158  BP: (!) 111/50 (!) 110/58 116/63 111/65  Pulse: 64 64 67 62  Resp: 20 18 (!) 22 17  Temp: 97.8 F (36.6 C) (!) 97.2 F (36.2 C) (!) 97.2 F (36.2 C) 98 F (36.7 C)  TempSrc: Oral Oral Oral Oral  SpO2: 99% 98% 99% 99%  Weight:      Height:        Intake/Output Summary (Last 24 hours) at 09/08/2018 1320 Last data filed at 09/08/2018 0900  Gross per 24 hour  Intake 600 ml  Output 1075 ml  Net -475 ml   Filed Weights   09/05/18 1749  Weight: 73.9 kg    Examination:  General exam: Not acute distress Respiratory system: Bilateral crackles Cardiovascular system: S1, S2 regular rhythm and rate Gastrointestinal system: Bowel sounds present, soft nontender nondistended Central nervous system: Alert  Extremities: Symmetric power  Data Reviewed: I have personally reviewed following labs and imaging studies  CBC: Recent Labs  Lab 09/05/18 1856 09/06/18 0328 09/07/18 0743 09/08/18 0804  WBC 7.1 6.8 6.0 5.9  NEUTROABS 5.1  --   --   --   HGB 9.0* 8.9* 8.5* 9.3*  HCT 32.2* 31.1* 30.5* 32.9*  MCV 85.6 83.6 83.3 84.8  PLT 163 183 174 976   Basic Metabolic Panel: Recent Labs  Lab 09/05/18 1856 09/06/18 0328 09/07/18 0743 09/08/18 0804  NA 133* 134* 135 134*  K 4.9 4.4 4.4 4.2  CL 98 95* 99 96*  CO2 22 26 24 25   GLUCOSE 116* 88 105* 118*  BUN 80* 87* 87* 87*  CREATININE 3.19* 3.22* 2.94* 2.48*  CALCIUM 8.4* 8.7* 8.6* 9.2   GFR: Estimated Creatinine Clearance: 15.7 mL/min (A) (by C-G formula based on SCr of 2.48 mg/dL (H)). Liver Function Tests: Recent Labs  Lab 09/05/18 1856 09/06/18 0328  AST 33 32  ALT 22 22  ALKPHOS 109 111  BILITOT 1.2 1.2  PROT 6.6 6.6  ALBUMIN 3.1* 3.1*   No results for input(s): LIPASE, AMYLASE in the last 168 hours. No results for input(s): AMMONIA in the last 168 hours. Coagulation Profile: No results for input(s): INR, PROTIME in the last 168 hours. Cardiac Enzymes: Recent Labs  Lab 09/05/18 1856  TROPONINI 1.57*   BNP (last 3 results) No results for input(s): PROBNP in the last 8760 hours. HbA1C: No results for input(s): HGBA1C in the last 72 hours. CBG: Recent Labs  Lab 09/07/18 1137 09/07/18 1627 09/07/18 2108 09/08/18 0613 09/08/18 1157  GLUCAP 79 141* 195* 97 109*   Lipid Profile: No results for input(s): CHOL, HDL, LDLCALC, TRIG, CHOLHDL, LDLDIRECT in the last 72 hours. Thyroid Function Tests: No results for input(s): TSH, T4TOTAL, FREET4, T3FREE, THYROIDAB in the last 72 hours. Anemia Panel: No results for input(s): VITAMINB12, FOLATE, FERRITIN, TIBC, IRON, RETICCTPCT in the last 72 hours. Sepsis Labs: No results for input(s): PROCALCITON, LATICACIDVEN in the last 168 hours.  Recent Results (from the past  240 hour(s))  MRSA PCR Screening     Status: None   Collection Time: 09/06/18  8:52 PM  Result Value Ref Range Status   MRSA by PCR NEGATIVE NEGATIVE Final    Comment:        The GeneXpert MRSA Assay (FDA approved for NASAL specimens only), is one component of a comprehensive MRSA colonization surveillance program. It is not intended to diagnose MRSA infection nor to guide or monitor treatment for MRSA infections. Performed at [facility], [place] [address], [place], [place] [address]          Radiology Studies: Dg Abd 1 View  Result Date: 09/06/2018 CLINICAL DATA:  Abdominal pain. EXAM: ABDOMEN - 1 VIEW COMPARISON:  Radiograph of October 01, 2017. FINDINGS: The bowel gas pattern is normal. Status post cholecystectomy. No radio-opaque calculi or other significant radiographic abnormality are seen. IMPRESSION: No evidence of bowel obstruction or ileus. Electronically Signed   By: Marijo Conception, M.D.   On: 09/06/2018 16:16   US Renal  Result Date: 09/06/2018 CLINICAL DATA:  85 year old female with chronic kidney disease. Head and neck cancer. EXAM: RENAL / URINARY TRACT ULTRASOUND COMPLETE COMPARISON:  PET-CT 05/17/2018. FINDINGS: Right Kidney: Renal measurements: 11.0 x 4.3 x 3.6 centimeters = volume: 90 mL. Borderline to mild increased cortical echogenicity (image 20). No mass or hydronephrosis visualized. Left Kidney: Renal measurements: 10.1 x 4.8 x 5.5 centimeters = volume: 140 mL. Echogenicity within normal limits. No mass or hydronephrosis visualized. Bladder: Diminutive, by report Foley catheter is in place. IMPRESSION: 1. No acute renal findings. 2. Borderline to mild increased renal cortical echogenicity compatible with a degree of chronic medical renal disease. Electronically Signed   By: Genevie Ann M.D.   On: 09/06/2018 19:56        Scheduled Meds: . allopurinol  100 mg Oral Daily  . aspirin  81 mg Oral QHS  . carvedilol  3.125 mg Oral BID WC  . citalopram  20 mg  Oral Daily  . famotidine  20 mg Oral Daily  . gabapentin  100 mg Oral BID  . heparin  5,000 Units Subcutaneous Q8H  . insulin aspart  0-9 Units Subcutaneous TID WC  . insulin glargine  12 Units Subcutaneous Daily  . lidocaine  15 mL Mouth/Throat Q4H while awake  . polyethylene glycol  17 g Oral BID  . cyanocobalamin  2,000 mcg Oral Daily  . vitamin C  500 mg Oral Daily   Continuous Infusions:    LOS: 3 days    Time spent: 35 minutes    Elmarie Shiley, MD [facility] Pager [phone]  If 7PM-7AM, please contact night-coverage [email or web address] Password [email or web address] 09/08/2018, 1:20 PM

## 2018-09-08 NOTE — Progress Notes (Signed)
This Probation officer contacted patient's son to inform of copays due to being over medicare days at [facility]. Patient's son acknowledged. As requested, this Probation officer will fax FL-2 for facility review. Will continue to assist as needed and can facilitate discahrge when medically stable.

## 2018-09-08 NOTE — NC FL2 (Signed)
[place] LEVEL OF CARE SCREENING TOOL     IDENTIFICATION  Patient Name: [name] Birthdate: [birth year] Sex: female Admission Date (Current Location): 09/05/2018  [facility] Facility and [place] Number:  Herbalist and Address:  [facility], [place] [address] [place], [place] [address]      Provider Number: [id number]  Attending Physician Name and Address:  Elmarie Shiley, MD  Relative Name and Phone Number:  Worland,Mike [phone]    Current Level of Care: Hospital Recommended Level of Care: [place] Prior Approval Number:    Date Approved/Denied:   PASRR Number:    Discharge Plan: SNF    Current Diagnoses: Patient Active Problem List   Diagnosis Date Noted  . Goals of care, counseling/discussion   . Palliative care by specialist   . Renal failure (ARF), acute on chronic ([place]) 09/05/2018  . SOB (shortness of breath) 05/19/2018  . Chest pain 05/19/2018  . Abnormal LFTs 05/19/2018  . CHF exacerbation ([place]) 11/27/2017  . Elevated troponin   . Chronic pain syndrome 06/05/2015  . Thrombocytopenia ([place]) 06/05/2015  . Weakness generalized 06/04/2015  . Elevated troponin I level 06/04/2015  . Automatic implantable cardiac defibrillator in situ 07/22/2013  . Acute on chronic systolic heart failure ([place]) 11/24/2012  . Acute on chronic renal failure ([place]) 11/24/2012  . Hypoxemia 04/02/2012  . Stage III chronic kidney disease ([place]) 04/02/2012  . Non-ST elevation (NSTEMI) myocardial infarction ([place]) 02/19/2012  . Statin intolerance   . Biventricular implantable cardioverter-defibrillator in situ   . CAD (coronary artery disease)   . Ischemic cardiomyopathy   . Type 2 diabetes mellitus ([place]) 02/14/2011  . Shortness of breath 01/19/2010  . Coronary atherosclerosis of native coronary artery 07/03/2009  . HLD (hyperlipidemia) 04/12/2009  . SYSTOLIC HEART FAILURE, CHRONIC 04/12/2009    Orientation  RESPIRATION BLADDER Height & Weight     Self, Time, Place  (nasal cannula) Incontinent(Catheter) Weight: 163 lb (73.9 kg) Height:  5' (152.4 cm)  BEHAVIORAL SYMPTOMS/MOOD NEUROLOGICAL BOWEL NUTRITION STATUS      Incontinent (heart diet, thin fluids)  AMBULATORY STATUS COMMUNICATION OF NEEDS Skin   Limited Assist Verbally Normal                       Personal Care Assistance Level of Assistance  Bathing, Feeding, Dressing Bathing Assistance: Limited assistance Feeding assistance: Limited assistance Dressing Assistance: Limited assistance     Functional Limitations Info  Sight, Hearing, Speech Sight Info: Adequate Hearing Info: Adequate Speech Info: Adequate    SPECIAL CARE FACTORS FREQUENCY  PT (By licensed PT), OT (By licensed OT)     PT Frequency: 2x OT Frequency: 2x            Contractures Contractures Info: Not present    Additional Factors Info  Code Status Code Status Info: DNR             Current Medications (09/08/2018):  This is the current hospital active medication list Current Facility-Administered Medications  Medication Dose Route Frequency Provider Last Rate Last Dose  . albuterol (PROVENTIL) (2.5 MG/3ML) 0.083% nebulizer solution 1.25 mg  1.25 mg Nebulization Q4H PRN Gala Romney L, MD      . allopurinol (ZYLOPRIM) tablet 100 mg  100 mg Oral Daily Gala Romney L, MD   100 mg at 09/08/18 0804  . ALPRAZolam (XANAX) tablet 0.25 mg  0.25 mg Oral BID PRN Elwyn Reach, MD   0.25  mg at 09/07/18 0246  . aspirin chewable tablet 81 mg  81 mg Oral QHS Elwyn Reach, MD   81 mg at 09/07/18 2126  . carvedilol (COREG) tablet 3.125 mg  3.125 mg Oral BID WC Gala Romney L, MD   3.125 mg at 09/08/18 0804  . citalopram (CELEXA) tablet 20 mg  20 mg Oral Daily Elwyn Reach, MD   20 mg at 09/08/18 0803  . colchicine tablet 0.6 mg  0.6 mg Oral Daily PRN Regalado, Belkys A, MD      . famotidine (PEPCID) tablet 20 mg  20 mg Oral Daily Einar Grad, RPH   20 mg at 09/08/18 0804  . gabapentin (NEURONTIN) capsule 100 mg  100 mg Oral BID Gala Romney L, MD   100 mg at 09/08/18 0803  . heparin injection 5,000 Units  5,000 Units Subcutaneous Q8H Elwyn Reach, MD   5,000 Units at 09/08/18 [phone]  . HYDROcodone-acetaminophen (NORCO/VICODIN) 5-325 MG per tablet 0.5 tablet  0.5 tablet Oral Q4H PRN Elwyn Reach, MD   0.5 tablet at 09/08/18 0812  . insulin aspart (novoLOG) injection 0-9 Units  0-9 Units Subcutaneous TID WC Regalado, Belkys A, MD   1 Units at 09/07/18 1642  . insulin glargine (LANTUS) injection 12 Units  12 Units Subcutaneous Daily Regalado, Belkys A, MD   12 Units at 09/08/18 0808  . lidocaine (XYLOCAINE) 2 % viscous mouth solution 15 mL  15 mL Mouth/Throat Q4H while awake Gala Romney L, MD   15 mL at 09/08/18 0804  . nitroGLYCERIN (NITROSTAT) SL tablet 0.4 mg  0.4 mg Sublingual Q5 min PRN Gala Romney L, MD      . ondansetron (ZOFRAN) tablet 4 mg  4 mg Oral Q6H PRN Elwyn Reach, MD       Or  . ondansetron (ZOFRAN) injection 4 mg  4 mg Intravenous Q6H PRN Garba, Mohammad L, MD      . polyethylene glycol (MIRALAX / GLYCOLAX) packet 17 g  17 g Oral BID Regalado, Belkys A, MD   17 g at 09/08/18 0812  . vitamin B-12 (CYANOCOBALAMIN) tablet 2,000 mcg  2,000 mcg Oral Daily Elwyn Reach, MD   2,000 mcg at 09/08/18 0803  . vitamin C (ASCORBIC ACID) tablet 500 mg  500 mg Oral Daily Elwyn Reach, MD   500 mg at 09/08/18 8916     Discharge Medications: Please see discharge summary for a list of discharge medications.  Relevant Imaging Results:  Relevant Lab Results:   Additional Information SN: [id number]  [place] Chard, LCSW

## 2018-09-09 LAB — GLUCOSE, CAPILLARY
Glucose-Capillary: 120 mg/dL — ABNORMAL HIGH (ref 70–99)
Glucose-Capillary: 96 mg/dL (ref 70–99)
Glucose-Capillary: 86 mg/dL (ref 70–99)
Glucose-Capillary: 142 mg/dL — ABNORMAL HIGH (ref 70–99)
Glucose-Capillary: 289 mg/dL — ABNORMAL HIGH (ref 70–99)

## 2018-09-09 LAB — BASIC METABOLIC PANEL
Anion gap: 8 (ref 5–15)
BUN: 80 mg/dL — ABNORMAL HIGH (ref 8–23)
CO2: 25 mmol/L (ref 22–32)
Calcium: 8.6 mg/dL — ABNORMAL LOW (ref 8.9–10.3)
Chloride: 101 mmol/L (ref 98–111)
Creatinine, Ser: 2.19 mg/dL — ABNORMAL HIGH (ref 0.44–1.00)
GFR calc Af Amer: 24 mL/min — ABNORMAL LOW (ref >60)
GFR calc non Af Amer: 20 mL/min — ABNORMAL LOW (ref >60)
Glucose, Bld: 127 mg/dL — ABNORMAL HIGH (ref 70–99)
Potassium: 4.5 mmol/L (ref 3.5–5.1)
Sodium: 134 mmol/L — ABNORMAL LOW (ref 135–145)

## 2018-09-09 MED ORDER — TORSEMIDE 20 MG PO TABS
40.0000 mg | ORAL_TABLET | Freq: Every day | ORAL | Status: DC
  Administered 2018-09-09 – 2018-09-11 (×3): 40 mg via ORAL
  Filled 2018-09-09 (×3): qty 2

## 2018-09-09 MED ORDER — SENNA 8.6 MG PO TABS
1.0000 | ORAL_TABLET | Freq: Every day | ORAL | Status: DC
  Administered 2018-09-09 – 2018-09-11 (×3): 8.6 mg via ORAL
  Filled 2018-09-09 (×3): qty 1

## 2018-09-09 MED ORDER — INSULIN GLARGINE 100 UNIT/ML ~~LOC~~ SOLN
10.0000 [IU] | Freq: Every day | SUBCUTANEOUS | Status: DC
  Administered 2018-09-09 – 2018-09-11 (×3): 10 [IU] via SUBCUTANEOUS
  Filled 2018-09-09 (×3): qty 0.1

## 2018-09-09 NOTE — Progress Notes (Signed)
Patient restless / confused.  States, "my tailbone hurts".  No wounds or injury noted.  Refused breakfast or increased mobility.  CBG = 120 prior to shift change and is 96 at 0800.  MD aware.  See NO.

## 2018-09-09 NOTE — Progress Notes (Signed)
Palliative care:  Evaluated patient - she will not wake up to speak to me - flutters her eyes and drifts off back to sleep. Per nursing, this is consistent with their assessment as well. When patient is awake, not oriented enough to have [place] conversation. Multiple attempts to reach patient's son, Ronalee Belts, on cell and home phone. He has not answered or returned calls. Per brief conversation with patient Friday, her goal was to return to rehab. I suggest outpatient palliative care at rehab.  Thank you for this consult.  Juel Burrow, DNP, AGNP-C Palliative Medicine Team Team Phone # [phone]  Pager # [phone]  NO CHARGE

## 2018-09-09 NOTE — Progress Notes (Signed)
PROGRESS NOTE    [name]  KDT:[id number] DOB: 07/03/1933 DOA: 09/05/2018 PCP: Loman Brooklyn, FNP    Brief Narrative: 85 year old with past medical history significant for chronic kidney disease 3 last creatinine last discharge from the hospital in November was around 2.2.  Patient presents with generalized weakness, she was not able to ambulate or get up.  She at some point reports some shortness of breath. On my evaluation today she currently denies chest pain or shortness of breath.  She is complaining of her neuropathy pain lower extremity. Patient admitted with acute on chronic renal failure, urinary retention and elevation of troponin.   Assessment & Plan:   Principal Problem:   Acute on chronic renal failure (HCC) Active Problems:   HLD (hyperlipidemia)   Coronary atherosclerosis of native coronary artery   SYSTOLIC HEART FAILURE, CHRONIC   Type 2 diabetes mellitus (HCC)   Biventricular implantable cardioverter-defibrillator in situ   Weakness generalized   Elevated troponin I level   Renal failure (ARF), acute on chronic (HCC)   Goals of care, counseling/discussion   Palliative care by specialist   Estimated body mass index is 31.83 kg/m as calculated from the following:   Height as of this encounter: 5' (1.524 m).   Weight as of this encounter: 73.9 kg.  1-Acute on chronic renal failure stage III; creatinine per records around 2.2. Patient presented with a creatinine at 3.1 , BUN at 80. Patient noticed to have urine retention this morning.  Foley catheter placed. Per prior discharge summary she was deemed not a candidate for dialysis. Component of obstructive uropathy.  Improved with IV fluids, foley catheter placement.  Fluids stop to avoid volume overload.  Cr today at baseline 2.2. resume lower dose torsemide.   2-Elevation of troponin; in the setting of renal failure.  But there was also some report of chest pain at some point and shortness of  breath.  Cardiology has been consulted. No further evaluation per cardiology  3-Anemia probably of chronic disease.  Monitor hemoglobin. Hemoglobin stable at 9.3.  4-Chronic systolic heart failure; Torsemide initially on hold due to AKI.  Monitor for volume overload. Continue with carvedilol. Continue with aspirin. Resume torsemide lower home dose. Monitor renal function.   5-Constipation: Patient reported last bowel movement was done a week ago.   KUB negative for obstruction Had BM 3-14.  6-Diabetes type 2; She had an episode of hypoglycemia blood sugar in the 60s.  I will add chills insulin regimen.  Subsequently her blood sugar increased to 300. Adding a scale insulin Decreased lantus due to hypoglycemia.   7-Generalized weakness PT evaluation.  8-Chronic pain neuropathy.  Continue with low-dose Vicodin   DVT prophylaxis: Heparin. Code Status: DNR Family Communication: Discussed case with son.  Palliative care consulted Disposition Plan; awaiting palliative care meeting with son.   Consultants:  Cardiology  Procedures: (  None   Antimicrobials:   None   Subjective: Sleepy this AM, subsequently wake up. Denies abdominal pain. Complaints of tail bone pain.    Objective: Vitals:   09/08/18 1931 09/08/18 2350 09/09/18 0354 09/09/18 0735  BP: (!) 101/57 (!) 111/55 (!) 106/50 (!) 108/52  Pulse: (!) 59 62 61 61  Resp: 17 16 16 18   Temp: 98 F (36.7 C) (!) 97.3 F (36.3 C) (!) 97.5 F (36.4 C)   TempSrc: Oral Oral Oral   SpO2: 96% 99% 96% 98%  Weight:      Height:  Intake/Output Summary (Last 24 hours) at 09/09/2018 0753 Last data filed at 09/09/2018 0300 Gross per 24 hour  Intake 240 ml  Output 1025 ml  Net -785 ml   Filed Weights   09/05/18 1749  Weight: 73.9 kg    Examination:  General exam: Sleepy  Respiratory system: Bilateral crackles.  Cardiovascular system: S 1, S 2 RRR Gastrointestinal system: BS present, distended.   Central nervous system:sleepy  Extremities: trace edema.   Data Reviewed: I have personally reviewed following labs and imaging studies  CBC: Recent Labs  Lab 09/05/18 1856 09/06/18 0328 09/07/18 0743 09/08/18 0804  WBC 7.1 6.8 6.0 5.9  NEUTROABS 5.1  --   --   --   HGB 9.0* 8.9* 8.5* 9.3*  HCT 32.2* 31.1* 30.5* 32.9*  MCV 85.6 83.6 83.3 84.8  PLT 163 183 174 235   Basic Metabolic Panel: Recent Labs  Lab 09/05/18 1856 09/06/18 0328 09/07/18 0743 09/08/18 0804 09/08/18 1619 09/09/18 0229  NA 133* 134* 135 134*  --  134*  K 4.9 4.4 4.4 4.2  --  4.5  CL 98 95* 99 96*  --  101  CO2 22 26 24 25   --  25  GLUCOSE 116* 88 105* 118*  --  127*  BUN 80* 87* 87* 87*  --  80*  CREATININE 3.19* 3.22* 2.94* 2.48*  --  2.19*  CALCIUM 8.4* 8.7* 8.6* 9.2  --  8.6*  MG  --   --   --   --  2.6*  --    GFR: Estimated Creatinine Clearance: 17.8 mL/min (A) (by C-G formula based on SCr of 2.19 mg/dL (H)). Liver Function Tests: Recent Labs  Lab 09/05/18 1856 09/06/18 0328  AST 33 32  ALT 22 22  ALKPHOS 109 111  BILITOT 1.2 1.2  PROT 6.6 6.6  ALBUMIN 3.1* 3.1*   No results for input(s): LIPASE, AMYLASE in the last 168 hours. No results for input(s): AMMONIA in the last 168 hours. Coagulation Profile: No results for input(s): INR, PROTIME in the last 168 hours. Cardiac Enzymes: Recent Labs  Lab 09/05/18 1856  TROPONINI 1.57*   BNP (last 3 results) No results for input(s): PROBNP in the last 8760 hours. HbA1C: No results for input(s): HGBA1C in the last 72 hours. CBG: Recent Labs  Lab 09/08/18 0613 09/08/18 1157 09/08/18 1606 09/08/18 2103 09/09/18 0624  GLUCAP 97 109* 211* 199* 120*   Lipid Profile: No results for input(s): CHOL, HDL, LDLCALC, TRIG, CHOLHDL, LDLDIRECT in the last 72 hours. Thyroid Function Tests: No results for input(s): TSH, T4TOTAL, FREET4, T3FREE, THYROIDAB in the last 72 hours. Anemia Panel: No results for input(s): VITAMINB12, FOLATE,  FERRITIN, TIBC, IRON, RETICCTPCT in the last 72 hours. Sepsis Labs: No results for input(s): PROCALCITON, LATICACIDVEN in the last 168 hours.  Recent Results (from the past 240 hour(s))  MRSA PCR Screening     Status: None   Collection Time: 09/06/18  8:52 PM  Result Value Ref Range Status   MRSA by PCR NEGATIVE NEGATIVE Final    Comment:        The GeneXpert MRSA Assay (FDA approved for NASAL specimens only), is one component of a comprehensive MRSA colonization surveillance program. It is not intended to diagnose MRSA infection nor to guide or monitor treatment for MRSA infections. Performed at [facility], [place] [address] [place], [place] [address]          Radiology Studies: No results found.  Scheduled Meds: . allopurinol  100 mg Oral Daily  . aspirin  81 mg Oral QHS  . carvedilol  3.125 mg Oral BID WC  . citalopram  20 mg Oral Daily  . famotidine  20 mg Oral BID  . gabapentin  100 mg Oral BID  . heparin  5,000 Units Subcutaneous Q8H  . insulin aspart  0-9 Units Subcutaneous TID WC  . insulin glargine  12 Units Subcutaneous Daily  . lidocaine  15 mL Mouth/Throat Q4H while awake  . polyethylene glycol  17 g Oral BID  . cyanocobalamin  2,000 mcg Oral Daily  . vitamin C  500 mg Oral Daily   Continuous Infusions:    LOS: 4 days    Time spent: 35 minutes    Elmarie Shiley, MD [facility] Pager [phone]  If 7PM-7AM, please contact night-coverage [email or web address] Password [facility] 09/09/2018, 7:53 AM

## 2018-09-09 NOTE — Plan of Care (Signed)
  Problem: Education: Goal: Knowledge of General Education information will improve Description Including pain rating scale, medication(s)/side effects and non-pharmacologic comfort measures Outcome: Progressing   Problem: Health Behavior/Discharge Planning: Goal: Ability to manage health-related needs will improve Outcome: Progressing   Problem: Clinical Measurements: Goal: Will remain free from infection Outcome: Progressing Goal: Diagnostic test results will improve Outcome: Progressing Goal: Respiratory complications will improve Outcome: Progressing  Requiring Diamox for diureses.  BMET monitored for renal function

## 2018-09-10 DIAGNOSIS — N179 Acute kidney failure, unspecified: Secondary | ICD-10-CM

## 2018-09-10 LAB — GLUCOSE, CAPILLARY
Glucose-Capillary: 164 mg/dL — ABNORMAL HIGH (ref 70–99)
Glucose-Capillary: 136 mg/dL — ABNORMAL HIGH (ref 70–99)
Glucose-Capillary: 240 mg/dL — ABNORMAL HIGH (ref 70–99)
Glucose-Capillary: 243 mg/dL — ABNORMAL HIGH (ref 70–99)
Glucose-Capillary: 187 mg/dL — ABNORMAL HIGH (ref 70–99)

## 2018-09-10 LAB — BASIC METABOLIC PANEL
ANION GAP: 14 (ref 5–15)
BUN: 72 mg/dL — ABNORMAL HIGH (ref 8–23)
CO2: 27 mmol/L (ref 22–32)
Calcium: 9.5 mg/dL (ref 8.9–10.3)
Chloride: 96 mmol/L — ABNORMAL LOW (ref 98–111)
Creatinine, Ser: 1.8 mg/dL — ABNORMAL HIGH (ref 0.44–1.00)
GFR calc Af Amer: 30 mL/min — ABNORMAL LOW (ref >60)
GFR calc non Af Amer: 26 mL/min — ABNORMAL LOW (ref >60)
GLUCOSE: 252 mg/dL — AB (ref 70–99)
Potassium: 4.3 mmol/L (ref 3.5–5.1)
Sodium: 137 mmol/L (ref 135–145)

## 2018-09-10 MED ORDER — TORSEMIDE 20 MG PO TABS: 40.0000 mg | ORAL_TABLET | Freq: Every day | ORAL | 0 refills | Status: AC

## 2018-09-10 MED ORDER — HYDROCODONE-ACETAMINOPHEN 5-325 MG PO TABS: 0.5000 | ORAL_TABLET | ORAL | 0 refills | Status: AC | PRN

## 2018-09-10 MED ORDER — POLYETHYLENE GLYCOL 3350 17 G PO PACK: 17.0000 g | PACK | Freq: Two times a day (BID) | ORAL | 0 refills | Status: AC

## 2018-09-10 MED ORDER — INSULIN GLARGINE 100 UNITS/ML SOLOSTAR PEN: 10.0000 [IU] | PEN_INJECTOR | Freq: Every day | SUBCUTANEOUS | 11 refills | Status: AC

## 2018-09-10 NOTE — Discharge Summary (Signed)
Physician Discharge Summary  [name] QXI:[id number] DOB: 12-14-1933 DOA: 09/05/2018  PCP: Loman Brooklyn, FNP  Admit date: 09/05/2018 Discharge date: 09/10/2018  Admitted From: SNF Disposition:  SNF  Recommendations for Outpatient Follow-up:  1. Follow up with PCP in 1-2 weeks 2. Please consult Palliative care at Rehab.     Discharge Condition: stab;e.  CODE STATUS: DNR Diet recommendation: Heart Healthy / Carb Modified   Brief/Interim Summary: 85 year old with past medical history significant for chronic kidney disease 3 last creatinine last discharge from the hospital in November was around 2.2.  Patient presents with generalized weakness, she was not able to ambulate or get up.  She at some point reports some shortness of breath. On my evaluation today she currently denies chest pain or shortness of breath.  She is complaining of her neuropathy pain lower extremity. Patient admitted with acute on chronic renal failure, urinary retention and elevation of troponin.  1-Acute on chronic renal failure stage III; creatinine per records around 2.2. Patient presented with a creatinine at 3.1 , BUN at 80. Patient noticed to have urine retention this morning.  Foley catheter placed. Per prior discharge summary she was deemed not a candidate for dialysis. Component of obstructive uropathy.  Improved with IV fluids, foley catheter placement.  Fluids stop to avoid volume overload.   Torsemide resume 3-15, l;ower home dose. Continue at discharge. Can use  Metolazone PRN  She will need to be discharge with foley. Consider voiding trial, vs follow up with urology.  Palliative will meet with son prior to discharge today.   2-Elevation of troponin; in the setting of renal failure.  But there was also some report of chest pain at some point and shortness of breath.  Cardiology has been consulted. No further evaluation per cardiology  3-Anemia probably of chronic disease.   Monitor hemoglobin. Hemoglobin stable at 9.3.  4-Chronic systolic heart failure; Torsemide initially on hold due to AKI.  Monitor for volume overload. Continue with carvedilol. Continue with aspirin. Resume torsemide lower home dose. Monitor renal function.   5-Constipation: Patient reported last bowel movement was done a week ago.   KUB negative for obstruction Had BM 3-16. Continue with bowel regimen.    6-Diabetes type 2; She had an episode of hypoglycemia blood sugar in the 60s. Subsequently CBG increased.  Decreased lantus due to hypoglycemia. She will be discharge on 10 units daily.   7-Generalized weakness Related to multiple medical conditions.   8-Chronic pain neuropathy.  Continue with low-dose Vicodin, gabapentin   Discharge Diagnoses:  Principal Problem:   Acute on chronic renal failure (HCC) Active Problems:   HLD (hyperlipidemia)   Coronary atherosclerosis of native coronary artery   SYSTOLIC HEART FAILURE, CHRONIC   Type 2 diabetes mellitus (HCC)   Biventricular implantable cardioverter-defibrillator in situ   Weakness generalized   Elevated troponin I level   Renal failure (ARF), acute on chronic (HCC)   Goals of care, counseling/discussion   Palliative care by specialist    Discharge Instructions  Discharge Instructions    Diet - low sodium heart healthy   Complete by:  As directed    Increase activity slowly   Complete by:  As directed      Allergies as of 09/10/2018      Reactions   Augmentin [amoxicillin-pot Clavulanate] Other (See Comments)   Abdominal pain   Metformin Other (See Comments)   Upset stomach   Metolazone Other (See Comments)   "made everything hurt" 07/03/15  Sulfonamide Derivatives Other (See Comments)   swelling      Medication List    STOP taking these medications   potassium chloride SA 20 MEQ tablet Commonly known as:  K-DUR,KLOR-CON     TAKE these medications   albuterol 1.25 MG/3ML nebulizer  solution Commonly known as:  ACCUNEB Take 1 ampule by nebulization every 4 (four) hours as needed for wheezing or shortness of breath.   allopurinol 100 MG tablet Commonly known as:  ZYLOPRIM Take 100 mg by mouth daily.   ALPRAZolam 0.25 MG tablet Commonly known as:  XANAX Take 0.25 mg by mouth 2 (two) times daily as needed for anxiety.   aspirin 81 MG chewable tablet Chew 1 tablet (81 mg total) by mouth daily. What changed:  when to take this   carvedilol 3.125 MG tablet Commonly known as:  COREG Take 1 tablet (3.125 mg total) by mouth 2 (two) times daily with a meal.   citalopram 20 MG tablet Commonly known as:  CELEXA Take 20 mg by mouth daily.   colchicine 0.6 MG tablet Take 0.6 mg by mouth daily as needed (gout).   cyanocobalamin 2000 MCG tablet Take 2,000 mcg by mouth daily.   gabapentin 100 MG capsule Commonly known as:  NEURONTIN Take 100 mg by mouth 2 (two) times daily.   HYDROcodone-acetaminophen 5-325 MG tablet Commonly known as:  NORCO/VICODIN Take 0.5 tablets by mouth every 4 (four) hours as needed for moderate pain.   insulin glargine 100 unit/mL Sopn Commonly known as:  LANTUS Inject 0.1 mLs (10 Units total) into the skin daily. 50 units in the morning and 50 units in the evening What changed:    how much to take  when to take this   lidocaine 2 % solution Commonly known as:  XYLOCAINE Use as directed 15 mLs in the mouth or throat every 4 (four) hours.   metolazone 2.5 MG tablet Commonly known as:  ZAROXOLYN Take 2.5 mg by mouth 2 (two) times daily as needed (for CHF).   mometasone-formoterol 100-5 MCG/ACT Aero Commonly known as:  DULERA Inhale 2 puffs into the lungs 2 (two) times daily.   nitroGLYCERIN 0.4 MG SL tablet Commonly known as:  NITROSTAT Place 1 tablet (0.4 mg total) under the tongue every 5 (five) minutes as needed for chest pain.   OXYGEN Inhale 2 L into the lungs continuous.   polyethylene glycol packet Commonly known  as:  MIRALAX / GLYCOLAX Take 17 g by mouth 2 (two) times daily.   ranitidine 150 MG capsule Commonly known as:  ZANTAC Take 150 mg by mouth 2 (two) times daily.   senna-docusate 8.6-50 MG tablet Commonly known as:  Senokot-S Take 1 tablet by mouth 2 (two) times daily.   torsemide 20 MG tablet Commonly known as:  DEMADEX Take 2 tablets (40 mg total) by mouth daily. What changed:  when to take this   Vitamin C 500 MG Caps Take 500 mg by mouth daily.       Allergies  Allergen Reactions  . Augmentin [Amoxicillin-Pot Clavulanate] Other (See Comments)    Abdominal pain  . Metformin Other (See Comments)    Upset stomach  . Metolazone Other (See Comments)    "made everything hurt" 07/03/15  . Sulfonamide Derivatives Other (See Comments)    swelling    Consultations: Cardiology   Procedures/Studies: Dg Chest 1 View  Result Date: 09/05/2018 CLINICAL DATA:  Weakness EXAM: CHEST  1 VIEW COMPARISON:  08/13/2018 FINDINGS: Marked cardiac enlargement.  AICD with multiple pacemaker leads some which are disconnected. Negative for heart failure. Mild left lower lobe atelectasis and small left effusion. Right lung clear. IMPRESSION: Marked cardiac enlargement without heart failure Mild left lower lobe atelectasis and small left effusion. Electronically Signed   By: Franchot Gallo M.D.   On: 09/05/2018 18:33   Dg Abd 1 View  Result Date: 09/06/2018 CLINICAL DATA:  Abdominal pain. EXAM: ABDOMEN - 1 VIEW COMPARISON:  Radiograph of October 01, 2017. FINDINGS: The bowel gas pattern is normal. Status post cholecystectomy. No radio-opaque calculi or other significant radiographic abnormality are seen. IMPRESSION: No evidence of bowel obstruction or ileus. Electronically Signed   By: Marijo Conception, M.D.   On: 09/06/2018 16:16   US Renal  Result Date: 09/06/2018 CLINICAL DATA:  85 year old female with chronic kidney disease. Head and neck cancer. EXAM: RENAL / URINARY TRACT ULTRASOUND COMPLETE  COMPARISON:  PET-CT 05/17/2018. FINDINGS: Right Kidney: Renal measurements: 11.0 x 4.3 x 3.6 centimeters = volume: 90 mL. Borderline to mild increased cortical echogenicity (image 20). No mass or hydronephrosis visualized. Left Kidney: Renal measurements: 10.1 x 4.8 x 5.5 centimeters = volume: 140 mL. Echogenicity within normal limits. No mass or hydronephrosis visualized. Bladder: Diminutive, by report Foley catheter is in place. IMPRESSION: 1. No acute renal findings. 2. Borderline to mild increased renal cortical echogenicity compatible with a degree of chronic medical renal disease. Electronically Signed   By: Genevie Ann M.D.   On: 09/06/2018 19:56      Subjective: Alert eating breakfast. Per nurse patient has been eating more today . Patient asking to be discharge today   Discharge Exam: Vitals:   09/10/18 0400 09/10/18 0822  BP: (!) 115/56 (!) 115/51  Pulse: (!) 58   Resp: 19   Temp: (!) 97.5 F (36.4 C) 97.7 F (36.5 C)  SpO2: 100%      General: Pt is alert, awake, not in acute distress Cardiovascular: RRR, S1/S2 +, no rubs, no gallops Respiratory: CTA bilaterally, no wheezing, no rhonchi Abdominal: Soft, NT, ND, bowel sounds + Extremities: no edema, no cyanosis    The results of significant diagnostics from this hospitalization (including imaging, microbiology, ancillary and laboratory) are listed below for reference.     Microbiology: Recent Results (from the past 240 hour(s))  MRSA PCR Screening     Status: None   Collection Time: 09/06/18  8:52 PM  Result Value Ref Range Status   MRSA by PCR NEGATIVE NEGATIVE Final    Comment:        The GeneXpert MRSA Assay (FDA approved for NASAL specimens only), is one component of a comprehensive MRSA colonization surveillance program. It is not intended to diagnose MRSA infection nor to guide or monitor treatment for MRSA infections. Performed at [facility], [place] [address] [place], [place] [address]       Labs: BNP (last 3 results) Recent Labs    11/27/17 2127  BNP 7,616.0*   Basic Metabolic Panel: Recent Labs  Lab 09/05/18 1856 09/06/18 0328 09/07/18 0743 09/08/18 0804 09/08/18 1619 09/09/18 0229  NA 133* 134* 135 134*  --  134*  K 4.9 4.4 4.4 4.2  --  4.5  CL 98 95* 99 96*  --  101  CO2 22 26 24 25   --  25  GLUCOSE 116* 88 105* 118*  --  127*  BUN 80* 87* 87* 87*  --  80*  CREATININE 3.19* 3.22* 2.94* 2.48*  --  2.19*  CALCIUM 8.4* 8.7* 8.6* 9.2  --  8.6*  MG  --   --   --   --  2.6*  --    Liver Function Tests: Recent Labs  Lab 09/05/18 1856 09/06/18 0328  AST 33 32  ALT 22 22  ALKPHOS 109 111  BILITOT 1.2 1.2  PROT 6.6 6.6  ALBUMIN 3.1* 3.1*   No results for input(s): LIPASE, AMYLASE in the last 168 hours. No results for input(s): AMMONIA in the last 168 hours. CBC: Recent Labs  Lab 09/05/18 1856 09/06/18 0328 09/07/18 0743 09/08/18 0804  WBC 7.1 6.8 6.0 5.9  NEUTROABS 5.1  --   --   --   HGB 9.0* 8.9* 8.5* 9.3*  HCT 32.2* 31.1* 30.5* 32.9*  MCV 85.6 83.6 83.3 84.8  PLT 163 183 174 185   Cardiac Enzymes: Recent Labs  Lab 09/05/18 1856  TROPONINI 1.57*   BNP: Invalid input(s): POCBNP CBG: Recent Labs  Lab 09/09/18 1638 09/09/18 2133 09/10/18 0654 09/10/18 0738 09/10/18 1132  GLUCAP 142* 289* 164* 136* 240*   D-Dimer No results for input(s): DDIMER in the last 72 hours. Hgb A1c No results for input(s): HGBA1C in the last 72 hours. Lipid Profile No results for input(s): CHOL, HDL, LDLCALC, TRIG, CHOLHDL, LDLDIRECT in the last 72 hours. Thyroid function studies No results for input(s): TSH, T4TOTAL, T3FREE, THYROIDAB in the last 72 hours.  Invalid input(s): FREET3 Anemia work up No results for input(s): VITAMINB12, FOLATE, FERRITIN, TIBC, IRON, RETICCTPCT in the last 72 hours. Urinalysis    Component Value Date/Time   COLORURINE YELLOW 09/05/2018 2018   APPEARANCEUR CLEAR 09/05/2018 2018   LABSPEC 1.015 09/05/2018 2018    PHURINE 5.0 09/05/2018 2018   GLUCOSEU NEGATIVE 09/05/2018 2018   HGBUR NEGATIVE 09/05/2018 2018   [place] NEGATIVE 09/05/2018 2018   KETONESUR NEGATIVE 09/05/2018 2018   PROTEINUR NEGATIVE 09/05/2018 2018   NITRITE NEGATIVE 09/05/2018 2018   LEUKOCYTESUR TRACE (A) 09/05/2018 2018   Sepsis Labs Invalid input(s): PROCALCITONIN,  WBC,  LACTICIDVEN Microbiology Recent Results (from the past 240 hour(s))  MRSA PCR Screening     Status: None   Collection Time: 09/06/18  8:52 PM  Result Value Ref Range Status   MRSA by PCR NEGATIVE NEGATIVE Final    Comment:        The GeneXpert MRSA Assay (FDA approved for NASAL specimens only), is one component of a comprehensive MRSA colonization surveillance program. It is not intended to diagnose MRSA infection nor to guide or monitor treatment for MRSA infections. Performed at [facility], [place] [address] [place], [place] [address]      Time coordinating discharge: 40 minutes  SIGNED:   Elmarie Shiley, MD  [facility]

## 2018-09-10 NOTE — Care Management Important Message (Signed)
Important Message  Patient Details  Name: [name] MRN: [id number] Date of Birth: August 30, 1933   Medicare Important Message Given:  Yes Due to condition, patient unable to sign. Unsigned copy left at bedside.   [name] 09/10/2018, 4:26 PM

## 2018-09-10 NOTE — Progress Notes (Signed)
Daily Progress Note   Patient Name: [name]       Date: 09/10/2018 DOB: 1933/07/13  Age: 85 y.o. MRN#: [id number] Attending Physician: Elmarie Shiley, MD Primary Care Physician: Loman Brooklyn, FNP Admit Date: 09/05/2018  Reason for Consultation/Follow-up: Establishing goals of care  Subjective: Patient continues to be drowsy - but more alert than previous visits. Son, Ronalee Belts, at bedside.   Length of Stay: 5  Current Medications: Scheduled Meds:   allopurinol  100 mg Oral Daily   aspirin  81 mg Oral QHS   carvedilol  3.125 mg Oral BID WC   citalopram  20 mg Oral Daily   famotidine  20 mg Oral BID   gabapentin  100 mg Oral BID   heparin  5,000 Units Subcutaneous Q8H   insulin aspart  0-9 Units Subcutaneous TID WC   insulin glargine  10 Units Subcutaneous Daily   lidocaine  15 mL Mouth/Throat Q4H while awake   polyethylene glycol  17 g Oral BID   senna  1 tablet Oral Daily   torsemide  40 mg Oral Daily   cyanocobalamin  2,000 mcg Oral Daily   vitamin C  500 mg Oral Daily    Continuous Infusions:   PRN Meds: albuterol, ALPRAZolam, colchicine, HYDROcodone-acetaminophen, nitroGLYCERIN, ondansetron **OR** ondansetron (ZOFRAN) IV  Physical Exam Constitutional:      General: She is not in acute distress. Cardiovascular:     Rate and Rhythm: Normal rate and regular rhythm.  Pulmonary:     Effort: Pulmonary effort is normal.  Abdominal:     Palpations: Abdomen is soft.  Skin:    General: Skin is warm and dry.  Neurological:     Mental Status: She is oriented to person, place, and time. She is lethargic.             Vital Signs: BP (!) 115/51 (BP Location: Left Arm)    Pulse (!) 58    Temp 97.7 F (36.5 C) (Oral)    Resp 19    Ht 5' (1.524 m)    Wt  73.9 kg    SpO2 100%    BMI 31.83 kg/m  SpO2: SpO2: 100 % O2 Device: O2 Device: Nasal Cannula O2 Flow Rate: O2 Flow Rate (L/min): 1 L/min  Intake/output summary:   Intake/Output Summary (Last 24 hours) at 09/10/2018 1437 Last data filed at 09/10/2018 0400 Gross per 24 hour  Intake 600 ml  Output 1325 ml  Net -725 ml   LBM: Last BM Date: 09/08/18 Baseline Weight: Weight: 73.9 kg Most recent weight: Weight: 73.9 kg       Palliative Assessment/Data: PPS  40%   Flowsheet Rows     Most Recent Value  Intake Tab  Referral Department  Hospitalist  Unit at Time of Referral  Cardiac/Telemetry Unit  Palliative Care Primary Diagnosis  Nephrology  Date Notified  09/07/18  Palliative Care Type  New Palliative care  Reason for referral  Clarify Goals of Care  Date of Admission  09/05/18  Date first seen by Palliative Care  09/07/18  # of days Palliative referral response time  0 Day(s)  # of days IP prior to Palliative referral  2  Clinical Assessment  Psychosocial & Spiritual Assessment  Palliative Care Outcomes  Patient/Family meeting held?  Yes  Who was at the meeting?  patient  Palliative Care Outcomes  Counseled regarding hospice      Patient Active Problem List   Diagnosis Date Noted   Goals of care, counseling/discussion    Palliative care by specialist    Renal failure (ARF), acute on chronic ([place]) 09/05/2018   SOB (shortness of breath) 05/19/2018   Chest pain 05/19/2018   Abnormal LFTs 05/19/2018   CHF exacerbation ([place]) 11/27/2017   Elevated troponin    Chronic pain syndrome 06/05/2015   Thrombocytopenia ([place]) 06/05/2015   Weakness generalized 06/04/2015   Elevated troponin I level 06/04/2015   Automatic implantable cardiac defibrillator in situ 07/22/2013   Acute on chronic systolic heart failure ([place]) 11/24/2012   Acute on chronic renal failure ([place]) 11/24/2012   Hypoxemia 04/02/2012   Stage III chronic kidney disease ([place]) 04/02/2012    Non-ST elevation (NSTEMI) myocardial infarction ([place]) 02/19/2012   Statin intolerance    Biventricular implantable cardioverter-defibrillator in situ    CAD (coronary artery disease)    Ischemic cardiomyopathy    Type 2 diabetes mellitus ([place]) 02/14/2011   Shortness of breath 01/19/2010   Coronary atherosclerosis of native coronary artery 07/03/2009   HLD (hyperlipidemia) 40/03/2724   SYSTOLIC HEART FAILURE, CHRONIC 04/12/2009    Palliative Care Assessment & Plan   HPI: 85 y.o. 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 Found to have acute on chronic renal failure and elevated troponin. Patient is not a candidate for dialysis. Diuretics are being held in setting of AKI. PMT consulted for [place].  Assessment: Follow up with patient and son.   We discussed her current illness and what it means in the larger context of her on-going co-morbidities.  Natural disease trajectory and expectations at EOL were discussed. We specifically discussed her heart failure and kidney failure. Discussed clinical course. Discussed her improvements while in the hospital. Discussed concerns about long-term health d/t chronic diseases. Discussed concern about repeated hospitalizations.  I attempted to elicit values and goals of care important to the patient.  We discussed a MOST form - patient and son agree to: DNR, do NOT hospitalize, determine use of antibiotics when infection occurs, NO IV fluids, NO feeding tube. Patient reiterates that she does not want to return to the hospital. We discussed that if patient declined at rehab we could focus on her comfort and involve hospice to support her. Patient and son both agree to this.  Patient and son both share they feel patient has poor quality of  life and do not want to pursue measures to prolong life.   We discussed having palliative care outpatient see patient at rehab and they agree to this.   Discussed with social work who will arrange outpatient palliative care at facility.  Son shared that he is unable to care for patient at home. He shared that his wife passed away about 3 months ago - she had hospice care. He tells me he is still recovering from this. Emotional support provided.  Questions and concerns were addressed. The family was encouraged to call with questions or concerns.   Recommendations/Plan:  MOST completed and placed on chart: NO further hospitalizations - she would want  to transition to hospice if she declines again  Palliative care to follow at facility  Goals of Care and Additional Recommendations:  Limitations on Scope of Treatment: Avoid Hospitalization, No Artificial Feeding, No IV Fluids and No Tracheostomy  Code Status:  DNR  Prognosis:   Unable to determine  Discharge Planning:  [place] for rehab with Palliative care service follow-up  Care plan was discussed with patient, son, social worker  Thank you for allowing the Palliative Medicine Team to assist in the care of this patient.   Time In: 1300 Time Out: 1415 Total Time 75 minutes Prolonged Time Billed  yes       Greater than 50%  of this time was spent counseling and coordinating care related to the above assessment and plan.  Juel Burrow, DNP, [facility] Palliative Medicine Team Team Phone # [phone]  Pager [phone]

## 2018-09-10 NOTE — Progress Notes (Signed)
CSW met with the patient and her son at bedside. Palliative was in the room speaking with both the patient and her son. Patient will require SNF with Hospice to follow. CSW will need to coordinate with the family on which palliative service they would like to use.   CSW explained that the patient is within her co-pay days at [place] encouraged the patient's son to determine what specifically the patient's co-pay would be.[facility] has started [employer]. Patient son stated that he filed an appeal with the insurance company and he has applied for medicaid for the patient.  Patient's son became agitated because he found out that his appeal had never gone through. He hung up the phone one the insurance company. Patient son not able to take his mother home due to not being able to care for her safely at this time.   Patient is medically ready for discharge. We are awaiting insurance authorization.   CSW verified with the admissions director that insurance authorization has started.   Domenic Schwab, MSW, [place]

## 2018-09-11 LAB — GLUCOSE, CAPILLARY
GLUCOSE-CAPILLARY: 316 mg/dL — AB (ref 70–99)
Glucose-Capillary: 181 mg/dL — ABNORMAL HIGH (ref 70–99)

## 2018-09-11 NOTE — Discharge Summary (Addendum)
Physician Discharge Summary  [name] BPZ:[id number] DOB: 1934/02/04 DOA: 09/05/2018  PCP: Loman Brooklyn, FNP  Admit date: 09/05/2018 Discharge date: 09/11/2018  Admitted From: SNF Disposition:  SNF  Recommendations for Outpatient Follow-up:  1. Follow up with PCP in 1-2 weeks 2. Hospice to follow patient at SNF> Most form was filled. No further hospitalizations.     Discharge Condition: stab;e.  CODE STATUS: DNR Diet recommendation: Heart Healthy / Carb Modified   Brief/Interim Summary: 85 year old with past medical history significant for chronic kidney disease 3 last creatinine last discharge from the hospital in November was around 2.2.  Patient presents with generalized weakness, she was not able to ambulate or get up.  She at some point reports some shortness of breath. On my evaluation today she currently denies chest pain or shortness of breath.  She is complaining of her neuropathy pain lower extremity. Patient admitted with acute on chronic renal failure, urinary retention and elevation of troponin.  1-Acute on chronic renal failure stage III; creatinine per records around 2.2. Patient presented with a creatinine at 3.1 , BUN at 80. Patient noticed to have urine retention this morning.  Foley catheter placed. Per prior discharge summary she was deemed not a candidate for dialysis. Component of obstructive uropathy.  Improved with IV fluids, foley catheter placement.  Fluids stop to avoid volume overload.   Torsemide resume 3-15, l;ower home dose. Continue at discharge. Can use  Metolazone PRN  She will need to be discharge with foley. Consider voiding trial, vs follow up with urology.  Cr stable on torsemide at 1.8.   2-Elevation of troponin; in the setting of renal failure.  But there was also some report of chest pain at some point and shortness of breath.  Cardiology has been consulted. No further evaluation per cardiology  3-Anemia probably of  chronic disease.  Monitor hemoglobin. Hemoglobin stable at 9.3.  4-Chronic systolic heart failure; Torsemide initially on hold due to AKI.  Monitor for volume overload. Continue with carvedilol. Continue with aspirin. Resume torsemide lower home dose. Renal function stable on torsemide.  Metolazone PRN for increase edema or weight.   5-Constipation: Patient reported last bowel movement was done a week ago.   KUB negative for obstruction Had BM 3-16. Continue with bowel regimen.    6-Diabetes type 2; She had an episode of hypoglycemia blood sugar in the 60s. Subsequently CBG increased.  Decreased lantus due to hypoglycemia. She will be discharge on 10 units daily.   7-Generalized weakness Related to multiple medical conditions.   8-Chronic pain neuropathy.  Continue with low-dose Vicodin, gabapentin   Discharge Diagnoses:  Principal Problem:   Acute on chronic renal failure (HCC) Active Problems:   HLD (hyperlipidemia)   Coronary atherosclerosis of native coronary artery   SYSTOLIC HEART FAILURE, CHRONIC   Type 2 diabetes mellitus (HCC)   Biventricular implantable cardioverter-defibrillator in situ   Weakness generalized   Elevated troponin I level   Renal failure (ARF), acute on chronic (HCC)   Goals of care, counseling/discussion   Palliative care by specialist   AKI (acute kidney injury) [facility])    Discharge Instructions  Discharge Instructions    Diet - low sodium heart healthy   Complete by:  As directed    Diet - low sodium heart healthy   Complete by:  As directed    Increase activity slowly   Complete by:  As directed    Increase activity slowly   Complete by:  As directed  Allergies as of 09/11/2018      Reactions   Augmentin [amoxicillin-pot Clavulanate] Other (See Comments)   Abdominal pain   Metformin Other (See Comments)   Upset stomach   Metolazone Other (See Comments)   "made everything hurt" 07/03/15   Sulfonamide Derivatives Other  (See Comments)   swelling      Medication List    STOP taking these medications   potassium chloride SA 20 MEQ tablet Commonly known as:  K-DUR,KLOR-CON     TAKE these medications   albuterol 1.25 MG/3ML nebulizer solution Commonly known as:  ACCUNEB Take 1 ampule by nebulization every 4 (four) hours as needed for wheezing or shortness of breath.   allopurinol 100 MG tablet Commonly known as:  ZYLOPRIM Take 100 mg by mouth daily.   ALPRAZolam 0.25 MG tablet Commonly known as:  XANAX Take 0.25 mg by mouth 2 (two) times daily as needed for anxiety.   aspirin 81 MG chewable tablet Chew 1 tablet (81 mg total) by mouth daily. What changed:  when to take this   carvedilol 3.125 MG tablet Commonly known as:  COREG Take 1 tablet (3.125 mg total) by mouth 2 (two) times daily with a meal.   citalopram 20 MG tablet Commonly known as:  CELEXA Take 20 mg by mouth daily.   colchicine 0.6 MG tablet Take 0.6 mg by mouth daily as needed (gout).   cyanocobalamin 2000 MCG tablet Take 2,000 mcg by mouth daily.   gabapentin 100 MG capsule Commonly known as:  NEURONTIN Take 100 mg by mouth 2 (two) times daily.   HYDROcodone-acetaminophen 5-325 MG tablet Commonly known as:  NORCO/VICODIN Take 0.5 tablets by mouth every 4 (four) hours as needed for moderate pain.   insulin glargine 100 unit/mL Sopn Commonly known as:  LANTUS Inject 0.1 mLs (10 Units total) into the skin daily. 50 units in the morning and 50 units in the evening What changed:    how much to take  when to take this   lidocaine 2 % solution Commonly known as:  XYLOCAINE Use as directed 15 mLs in the mouth or throat every 4 (four) hours.   metolazone 2.5 MG tablet Commonly known as:  ZAROXOLYN Take 2.5 mg by mouth 2 (two) times daily as needed (for CHF).   mometasone-formoterol 100-5 MCG/ACT Aero Commonly known as:  DULERA Inhale 2 puffs into the lungs 2 (two) times daily.   nitroGLYCERIN 0.4 MG SL  tablet Commonly known as:  NITROSTAT Place 1 tablet (0.4 mg total) under the tongue every 5 (five) minutes as needed for chest pain.   OXYGEN Inhale 2 L into the lungs continuous.   polyethylene glycol packet Commonly known as:  MIRALAX / GLYCOLAX Take 17 g by mouth 2 (two) times daily.   ranitidine 150 MG capsule Commonly known as:  ZANTAC Take 150 mg by mouth 2 (two) times daily.   senna-docusate 8.6-50 MG tablet Commonly known as:  Senokot-S Take 1 tablet by mouth 2 (two) times daily.   torsemide 20 MG tablet Commonly known as:  DEMADEX Take 2 tablets (40 mg total) by mouth daily. What changed:  when to take this   Vitamin C 500 MG Caps Take 500 mg by mouth daily.       Allergies  Allergen Reactions  . Augmentin [Amoxicillin-Pot Clavulanate] Other (See Comments)    Abdominal pain  . Metformin Other (See Comments)    Upset stomach  . Metolazone Other (See Comments)    "made  everything hurt" 07/03/15  . Sulfonamide Derivatives Other (See Comments)    swelling    Consultations: Cardiology   Procedures/Studies: Dg Chest 1 View  Result Date: 09/05/2018 CLINICAL DATA:  Weakness EXAM: CHEST  1 VIEW COMPARISON:  08/13/2018 FINDINGS: Marked cardiac enlargement. AICD with multiple pacemaker leads some which are disconnected. Negative for heart failure. Mild left lower lobe atelectasis and small left effusion. Right lung clear. IMPRESSION: Marked cardiac enlargement without heart failure Mild left lower lobe atelectasis and small left effusion. Electronically Signed   By: Franchot Gallo M.D.   On: 09/05/2018 18:33   Dg Abd 1 View  Result Date: 09/06/2018 CLINICAL DATA:  Abdominal pain. EXAM: ABDOMEN - 1 VIEW COMPARISON:  Radiograph of October 01, 2017. FINDINGS: The bowel gas pattern is normal. Status post cholecystectomy. No radio-opaque calculi or other significant radiographic abnormality are seen. IMPRESSION: No evidence of bowel obstruction or ileus. Electronically Signed    By: Marijo Conception, M.D.   On: 09/06/2018 16:16   US Renal  Result Date: 09/06/2018 CLINICAL DATA:  85 year old female with chronic kidney disease. Head and neck cancer. EXAM: RENAL / URINARY TRACT ULTRASOUND COMPLETE COMPARISON:  PET-CT 05/17/2018. FINDINGS: Right Kidney: Renal measurements: 11.0 x 4.3 x 3.6 centimeters = volume: 90 mL. Borderline to mild increased cortical echogenicity (image 20). No mass or hydronephrosis visualized. Left Kidney: Renal measurements: 10.1 x 4.8 x 5.5 centimeters = volume: 140 mL. Echogenicity within normal limits. No mass or hydronephrosis visualized. Bladder: Diminutive, by report Foley catheter is in place. IMPRESSION: 1. No acute renal findings. 2. Borderline to mild increased renal cortical echogenicity compatible with a degree of chronic medical renal disease. Electronically Signed   By: Genevie Ann M.D.   On: 09/06/2018 19:56     Subjective: She is ready to go to SNF.  Denies pain.  Discharge Exam: Vitals:   09/11/18 0400 09/11/18 0600  BP: 135/69   Pulse: 68 64  Resp:    Temp: (!) 97.4 F (36.3 C)   SpO2: 95%      General: Pt is alert, awake, not in acute distress Cardiovascular: RRR, S1/S2 +, no rubs, no gallops Respiratory: CTA bilaterally, no wheezing, no rhonchi Abdominal: Soft, NT, ND, bowel sounds + Extremities: no edema, no cyanosis    The results of significant diagnostics from this hospitalization (including imaging, microbiology, ancillary and laboratory) are listed below for reference.     Microbiology: Recent Results (from the past 240 hour(s))  MRSA PCR Screening     Status: None   Collection Time: 09/06/18  8:52 PM  Result Value Ref Range Status   MRSA by PCR NEGATIVE NEGATIVE Final    Comment:        The GeneXpert MRSA Assay (FDA approved for NASAL specimens only), is one component of a comprehensive MRSA colonization surveillance program. It is not intended to diagnose MRSA infection nor to guide or monitor  treatment for MRSA infections. Performed at [facility], [place] [address], [place], [place] [address]      Labs: BNP (last 3 results) Recent Labs    11/27/17 2127  BNP 8,676.7*   Basic Metabolic Panel: Recent Labs  Lab 09/06/18 0328 09/07/18 0743 09/08/18 0804 09/08/18 1619 09/09/18 0229 09/10/18 1235  NA 134* 135 134*  --  134* 137  K 4.4 4.4 4.2  --  4.5 4.3  CL 95* 99 96*  --  101 96*  CO2 26 24 25   --  25 27  GLUCOSE 88 105* 118*  --  127* 252*  BUN 87* 87* 87*  --  80* 72*  CREATININE 3.22* 2.94* 2.48*  --  2.19* 1.80*  CALCIUM 8.7* 8.6* 9.2  --  8.6* 9.5  MG  --   --   --  2.6*  --   --    Liver Function Tests: Recent Labs  Lab 09/05/18 1856 09/06/18 0328  AST 33 32  ALT 22 22  ALKPHOS 109 111  BILITOT 1.2 1.2  PROT 6.6 6.6  ALBUMIN 3.1* 3.1*   No results for input(s): LIPASE, AMYLASE in the last 168 hours. No results for input(s): AMMONIA in the last 168 hours. CBC: Recent Labs  Lab 09/05/18 1856 09/06/18 0328 09/07/18 0743 09/08/18 0804  WBC 7.1 6.8 6.0 5.9  NEUTROABS 5.1  --   --   --   HGB 9.0* 8.9* 8.5* 9.3*  HCT 32.2* 31.1* 30.5* 32.9*  MCV 85.6 83.6 83.3 84.8  PLT 163 183 174 185   Cardiac Enzymes: Recent Labs  Lab 09/05/18 1856  TROPONINI 1.57*   BNP: Invalid input(s): POCBNP CBG: Recent Labs  Lab 09/10/18 0738 09/10/18 1132 09/10/18 1623 09/10/18 2126 09/11/18 0641  GLUCAP 136* 240* 243* 187* 181*   D-Dimer No results for input(s): DDIMER in the last 72 hours. Hgb A1c No results for input(s): HGBA1C in the last 72 hours. Lipid Profile No results for input(s): CHOL, HDL, LDLCALC, TRIG, CHOLHDL, LDLDIRECT in the last 72 hours. Thyroid function studies No results for input(s): TSH, T4TOTAL, T3FREE, THYROIDAB in the last 72 hours.  Invalid input(s): FREET3 Anemia work up No results for input(s): VITAMINB12, FOLATE, FERRITIN, TIBC, IRON, RETICCTPCT in the last 72 hours. Urinalysis    Component Value Date/Time    COLORURINE YELLOW 09/05/2018 2018   APPEARANCEUR CLEAR 09/05/2018 2018   LABSPEC 1.015 09/05/2018 2018   PHURINE 5.0 09/05/2018 2018   GLUCOSEU NEGATIVE 09/05/2018 2018   HGBUR NEGATIVE 09/05/2018 2018   [place] NEGATIVE 09/05/2018 2018   KETONESUR NEGATIVE 09/05/2018 2018   PROTEINUR NEGATIVE 09/05/2018 2018   NITRITE NEGATIVE 09/05/2018 2018   LEUKOCYTESUR TRACE (A) 09/05/2018 2018   Sepsis Labs Invalid input(s): PROCALCITONIN,  WBC,  LACTICIDVEN Microbiology Recent Results (from the past 240 hour(s))  MRSA PCR Screening     Status: None   Collection Time: 09/06/18  8:52 PM  Result Value Ref Range Status   MRSA by PCR NEGATIVE NEGATIVE Final    Comment:        The GeneXpert MRSA Assay (FDA approved for NASAL specimens only), is one component of a comprehensive MRSA colonization surveillance program. It is not intended to diagnose MRSA infection nor to guide or monitor treatment for MRSA infections. Performed at [facility], [place] [address] [place], [place] [address]      Time coordinating discharge: 40 minutes  SIGNED:   Elmarie Shiley, MD  [facility]

## 2018-09-11 NOTE — Progress Notes (Signed)
Physical Therapy Treatment Patient Details Name: [name] MRN: [id number] DOB: 09-03-1933 Today's Date: 09/11/2018    History of Present Illness Patient is 85 y/o female admitted to hospital secondary to acute on chronic renal failure. Patient with elevated troponin however per notes cardiac states limited treatment options and has signed off. 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  Palliative on board.     PT Comments    Pt progressing well towards their physical therapy goals. Increased ambulation distance to 15 feet using walker and min guard assist. Pt with increased anxiety and requiring redirection and relaxation techniques throughout session to focus on task. SNF remains appropriate based on deficits and pt increased risk of falls.     Follow Up Recommendations  SNF;Supervision/Assistance - 24 hour     Equipment Recommendations  Other (comment)(TBD at next venue)    Recommendations for Other Services       Precautions / Restrictions Precautions Precautions: Fall Restrictions Weight Bearing Restrictions: No    Mobility  Bed Mobility               General bed mobility comments: Pt received sitting EOB  Transfers Overall transfer level: Needs assistance Equipment used: Rolling walker (2 wheeled) Transfers: Sit to/from Stand Sit to Stand: Min guard         General transfer comment: Min guard for safety  Ambulation/Gait Ambulation/Gait assistance: Min guard Gait Distance (Feet): 15 Feet Assistive device: Rolling walker (2 wheeled) Gait Pattern/deviations: Step-through pattern;Decreased stride length;Shuffle;Trunk flexed Gait velocity: decreased Gait velocity interpretation: <1.8 ft/sec, indicate of risk for recurrent falls General Gait Details: Pt requiring multimodal cues for direction and sequencing. Slow, overall steady gait in room with min  guard assist for stability   Stairs             Wheelchair Mobility    Modified Rankin (Stroke Patients Only)       Balance Overall balance assessment: Needs assistance Sitting-balance support: Feet supported;No upper extremity supported Sitting balance-Leahy Scale: Good     Standing balance support: Bilateral upper extremity supported Standing balance-Leahy Scale: Poor Standing balance comment: reliant on BUE support to maintain standing balance                            Cognition Arousal/Alertness: Awake/alert Behavior During Therapy: WFL for tasks assessed/performed Overall Cognitive Status: Impaired/Different from baseline Area of Impairment: Problem solving;Awareness;Attention;Following commands                   Current Attention Level: Sustained   Following Commands: Follows one step commands consistently   Awareness: Emergent Problem Solving: Slow processing;Decreased initiation;Difficulty sequencing;Requires verbal cues;Requires tactile cues General Comments: Requiring multimodal cues for sequencing      Exercises      General Comments        Pertinent Vitals/Pain Pain Assessment: Faces Faces Pain Scale: Hurts little more Pain Location: bilateral feet Pain Descriptors / Indicators: Discomfort Pain Intervention(s): Monitored during session;Premedicated before session    Home Living                      Prior Function            PT Goals (current goals can now be found in the care plan section) Acute Rehab PT Goals Patient Stated Goal: "I want to leave." PT  Goal Formulation: With patient Time For Goal Achievement: 09/21/18 Potential to Achieve Goals: Fair Progress towards PT goals: Progressing toward goals    Frequency    Min 2X/week      PT Plan Current plan remains appropriate    Co-evaluation              AM-PAC PT "6 Clicks" Mobility   Outcome Measure  Help needed turning from your  back to your side while in a flat bed without using bedrails?: A Little Help needed moving from lying on your back to sitting on the side of a flat bed without using bedrails?: A Little Help needed moving to and from a bed to a chair (including a wheelchair)?: A Little Help needed standing up from a chair using your arms (e.g., wheelchair or bedside chair)?: A Little Help needed to walk in hospital room?: A Little Help needed climbing 3-5 steps with a railing? : Total 6 Click Score: 16    End of Session Equipment Utilized During Treatment: Gait belt Activity Tolerance: Patient tolerated treatment well Patient left: in bed;with call bell/phone within reach;with bed alarm set Nurse Communication: Mobility status PT Visit Diagnosis: Unsteadiness on feet (R26.81);Other abnormalities of gait and mobility (R26.89);Muscle weakness (generalized) (M62.81);Difficulty in walking, not elsewhere classified (R26.2)     Time: 0254-2706 PT Time Calculation (min) (ACUTE ONLY): 23 min  Charges:  $Therapeutic Activity: 23-37 mins                     Ellamae Sia, [place], DPT Acute Rehabilitation Services Pager [phone] Office [phone]    Willy Eddy 09/11/2018, 9:17 AM

## 2018-09-11 NOTE — TOC Transition Note (Signed)
Transition of Care [facility]) - CM/SW Discharge Note   Patient Details  Name: [name] MRN: [id number] Date of Birth: 02/17/1934  [facility] CM/SW Contact:  Candie Chroman, LCSW Phone Number: 09/11/2018, 12:10 PM   Clinical Narrative:   Insurance authorization approved for return to [facility]. Tried calling son on both available phone numbers. Left voicemail on his personal phone.  CSW facilitated patient discharge including contacting patient family and facility to confirm patient discharge plans. Clinical information faxed to facility and family agreeable with plan. CSW arranged ambulance transport via [facility] to [facility]. RN to call report prior to discharge [phone]).  CSW will sign off for now as social work intervention is no longer needed. Please consult [place] again if new needs arise.   Final next level of care: Skilled Nursing Facility Barriers to Discharge: Barriers Resolved   Patient Goals and CMS Choice Patient states their goals for this hospitalization and ongoing recovery are:: Patient not fully oriented. CMS Medicare.gov Compare Post Acute Care list provided to:: Other (Comment Required)(Patient returning to previous SNF.)    Discharge Placement   Existing PASRR number confirmed : 09/11/18          Patient chooses bed at: [facility] Patient to be transferred to facility by: [facility] Name of family member notified: Left voicemail for son [name] Patient and family notified of of transfer: 09/11/18  Discharge Plan and Services   Post Acute Care Choice: [facility]                    Social Determinants of Health (SDOH) Interventions     Readmission Risk Interventions No flowsheet data found.

## 2018-09-11 NOTE — Plan of Care (Signed)
  Problem: Clinical Measurements: Goal: Ability to maintain clinical measurements within normal limits will improve Outcome: Adequate for Discharge Goal: Cardiovascular complication will be avoided Outcome: Adequate for Discharge   Problem: Nutrition: Goal: Adequate nutrition will be maintained Outcome: Adequate for Discharge   Problem: Safety: Goal: Ability to remain free from injury will improve Outcome: Adequate for Discharge

## 2018-10-11 ENCOUNTER — Encounter: Payer: Medicare Other | Admitting: *Deleted

## 2018-10-11 ENCOUNTER — Other Ambulatory Visit: Payer: Self-pay

## 2018-10-11 MED ORDER — GLUCOSE 40 % PO GEL
15.00 | ORAL | Status: DC
Start: ? — End: 2018-10-11

## 2018-10-11 MED ORDER — ALLOPURINOL 100 MG PO TABS
100.00 | ORAL_TABLET | ORAL | Status: DC
Start: 2018-10-12 — End: 2018-10-11

## 2018-10-11 MED ORDER — ALPRAZOLAM 0.25 MG PO TABS
0.25 | ORAL_TABLET | ORAL | Status: DC
Start: ? — End: 2018-10-11

## 2018-10-11 MED ORDER — CITALOPRAM HYDROBROMIDE 20 MG PO TABS
20.00 | ORAL_TABLET | ORAL | Status: DC
Start: 2018-10-12 — End: 2018-10-11

## 2018-10-11 MED ORDER — ONDANSETRON 4 MG PO TBDP
4.00 | ORAL_TABLET | ORAL | Status: DC
Start: ? — End: 2018-10-11

## 2018-10-11 MED ORDER — CARVEDILOL 3.125 MG PO TABS
3.13 | ORAL_TABLET | ORAL | Status: DC
Start: 2018-10-11 — End: 2018-10-11

## 2018-10-11 MED ORDER — PANTOPRAZOLE SODIUM 40 MG PO TBEC
40.00 | DELAYED_RELEASE_TABLET | ORAL | Status: DC
Start: 2018-10-12 — End: 2018-10-11

## 2018-10-11 MED ORDER — SENNOSIDES-DOCUSATE SODIUM 8.6-50 MG PO TABS
2.00 | ORAL_TABLET | ORAL | Status: DC
Start: 2018-10-11 — End: 2018-10-11

## 2018-10-11 MED ORDER — ALBUTEROL SULFATE (2.5 MG/3ML) 0.083% IN NEBU
2.50 | INHALATION_SOLUTION | RESPIRATORY_TRACT | Status: DC
Start: ? — End: 2018-10-11

## 2018-10-11 MED ORDER — ONDANSETRON HCL 4 MG/2ML IJ SOLN
4.00 | INTRAMUSCULAR | Status: DC
Start: ? — End: 2018-10-11

## 2018-10-11 MED ORDER — HYDROMORPHONE HCL 2 MG PO TABS
1.00 | ORAL_TABLET | ORAL | Status: DC
Start: ? — End: 2018-10-11

## 2018-10-11 MED ORDER — HEPARIN SODIUM (PORCINE) 5000 UNIT/ML IJ SOLN
5000.00 | INTRAMUSCULAR | Status: DC
Start: 2018-10-11 — End: 2018-10-11

## 2018-10-11 MED ORDER — BUDESONIDE-FORMOTEROL FUMARATE 160-4.5 MCG/ACT IN AERO
2.00 | INHALATION_SPRAY | RESPIRATORY_TRACT | Status: DC
Start: 2018-10-11 — End: 2018-10-11

## 2018-10-11 MED ORDER — POLYETHYLENE GLYCOL 3350 17 G PO PACK
17.00 | PACK | ORAL | Status: DC
Start: 2018-10-12 — End: 2018-10-11

## 2018-10-11 MED ORDER — GABAPENTIN 100 MG PO CAPS
100.00 | ORAL_CAPSULE | ORAL | Status: DC
Start: 2018-10-11 — End: 2018-10-11

## 2018-10-11 MED ORDER — LEVETIRACETAM 250 MG PO TABS
250.00 | ORAL_TABLET | ORAL | Status: DC
Start: 2018-10-12 — End: 2018-10-11

## 2018-10-11 MED ORDER — INSULIN GLARGINE 100 UNIT/ML SOLOSTAR PEN
30.00 | PEN_INJECTOR | SUBCUTANEOUS | Status: DC
Start: 2018-10-11 — End: 2018-10-11

## 2018-10-11 MED ORDER — DEXTROSE 10 % IV SOLN
125.00 | INTRAVENOUS | Status: DC
Start: ? — End: 2018-10-11

## 2018-10-11 MED ORDER — SEVELAMER CARBONATE 800 MG PO TABS
800.00 | ORAL_TABLET | ORAL | Status: DC
Start: 2018-10-11 — End: 2018-10-11

## 2018-10-11 MED ORDER — INSULIN LISPRO 100 UNIT/ML ~~LOC~~ SOLN
5.00 | SUBCUTANEOUS | Status: DC
Start: 2018-10-11 — End: 2018-10-11

## 2018-10-11 MED ORDER — ACETAMINOPHEN 325 MG PO TABS
650.00 | ORAL_TABLET | ORAL | Status: DC
Start: 2018-10-11 — End: 2018-10-11

## 2018-10-12 ENCOUNTER — Telehealth: Payer: Self-pay

## 2018-10-12 NOTE — Telephone Encounter (Signed)
Unable to leave a message for patient to remind of missed remote transmission.  

## 2018-10-18 ENCOUNTER — Encounter: Payer: Self-pay | Admitting: Cardiology

## 2018-10-25 ENCOUNTER — Telehealth: Payer: Self-pay | Admitting: Internal Medicine

## 2018-10-25 NOTE — Telephone Encounter (Signed)
LM with Tommie Sams at [facility] requesting call back from nurse to my direct number. Pt's nurse is Amy Milana Obey.

## 2018-10-25 NOTE — Telephone Encounter (Signed)
Spoke with Amy, RN. Advised that pt's ICD therapy is already off. She verbalizes understanding and thanked me for my call. [place] phone number if any further questions.

## 2018-10-25 NOTE — Telephone Encounter (Signed)
Legrand Como (son) called stating that patient is going into [facility] told her son that her pacemaker would need to be turned off before they can move her to Hospice.  Please call [phone] -ask for Legrand Como or [place] .

## 2018-10-25 NOTE — Telephone Encounter (Signed)
Spoke with Legrand Como. Advised we do not turn pacemakers off. Legrand Como clarified he was calling to have pt's ICD therapies turned off. 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 Offered to call pt's hospice nurse to make them aware, which Legrand Como is agreeable to. He thanked me for the call and the information. He denies additional questions or concerns at this time.  [facility]

## 2018-11-26 DEATH — deceased

## 2019-10-17 IMAGING — DX DG CHEST 2V
2 series · 2 of 2 positions shown · non-contrast
Comparison: 11/05/2017

CLINICAL DATA: Shortness of breath

EXAM:
CHEST - 2 VIEW

[chest pa]
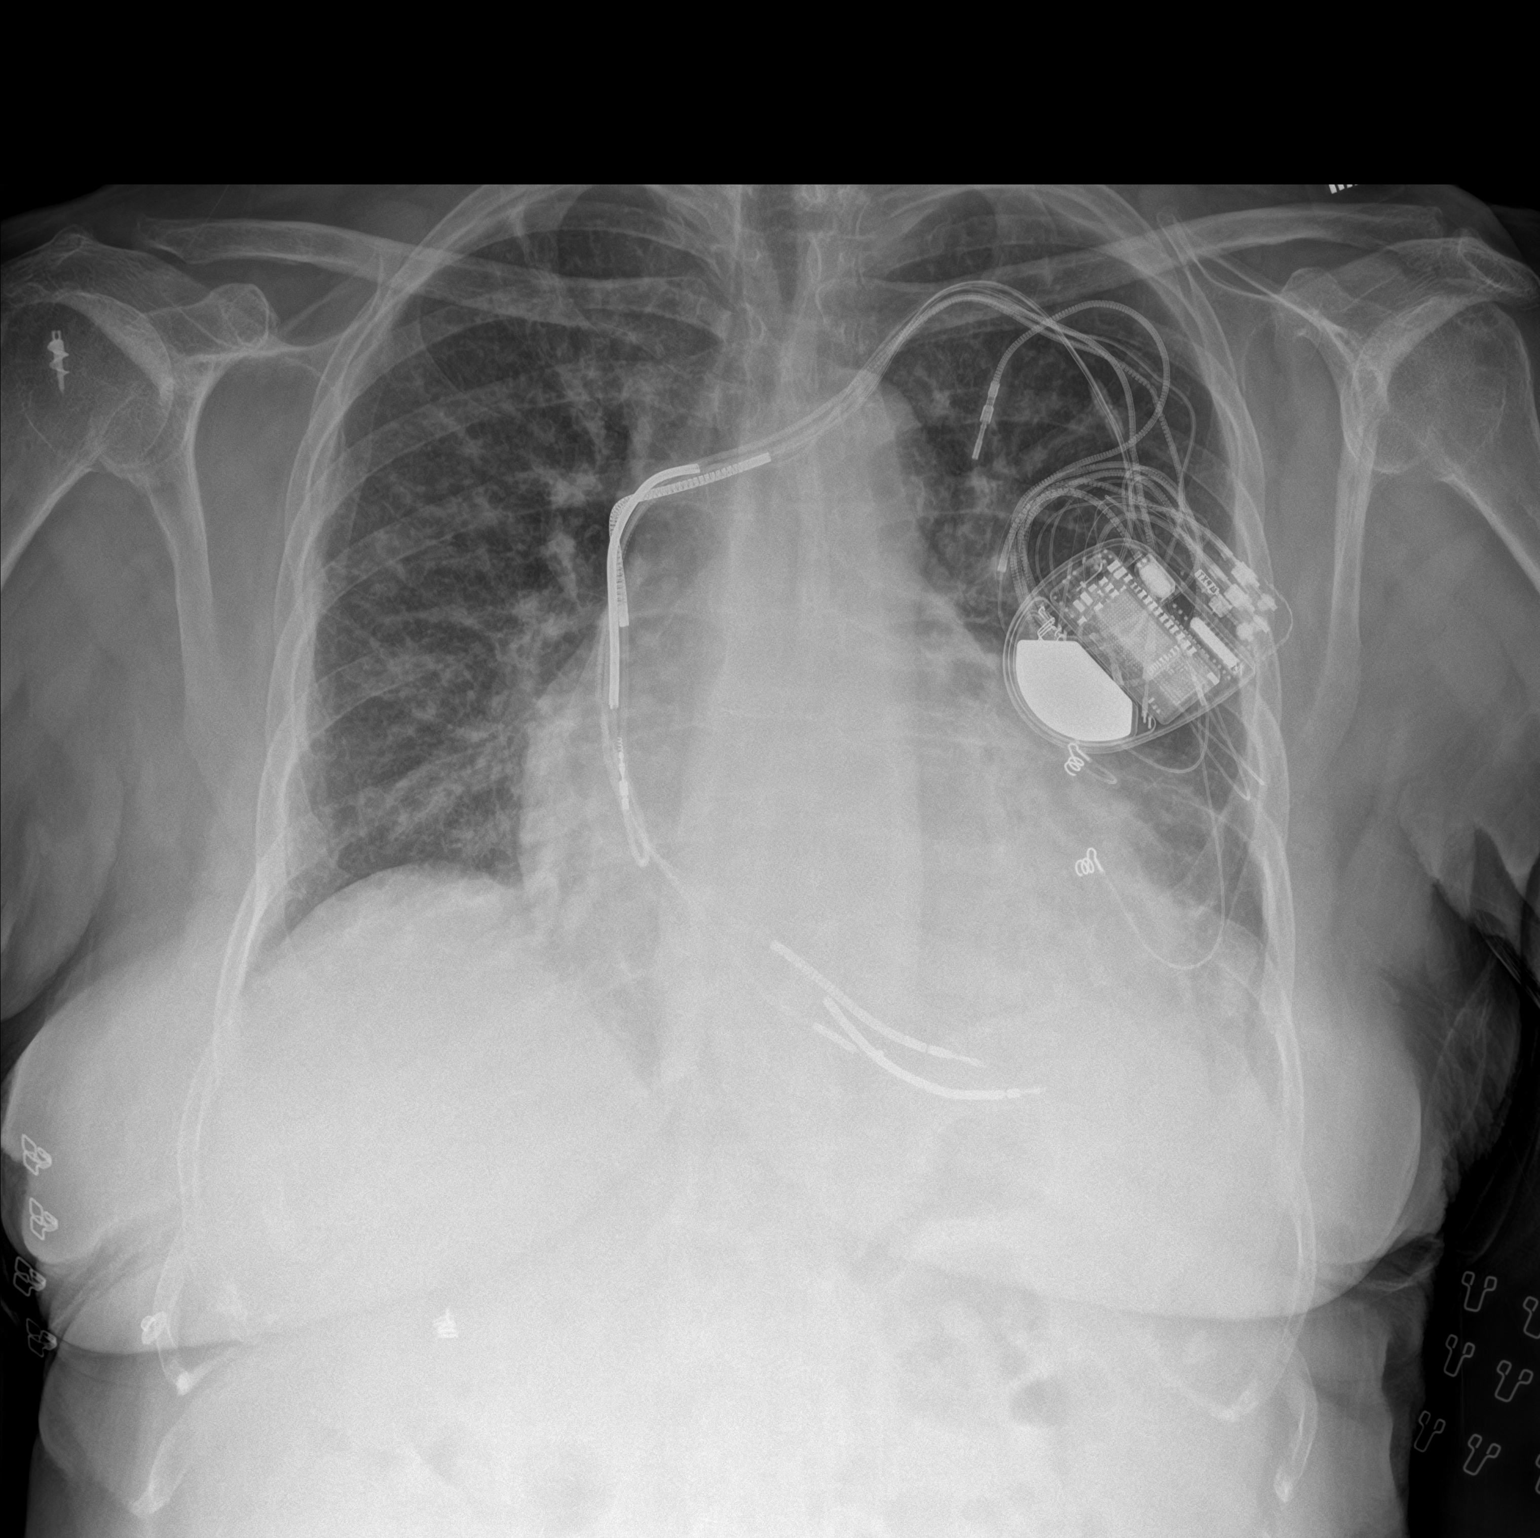

[chest lat]
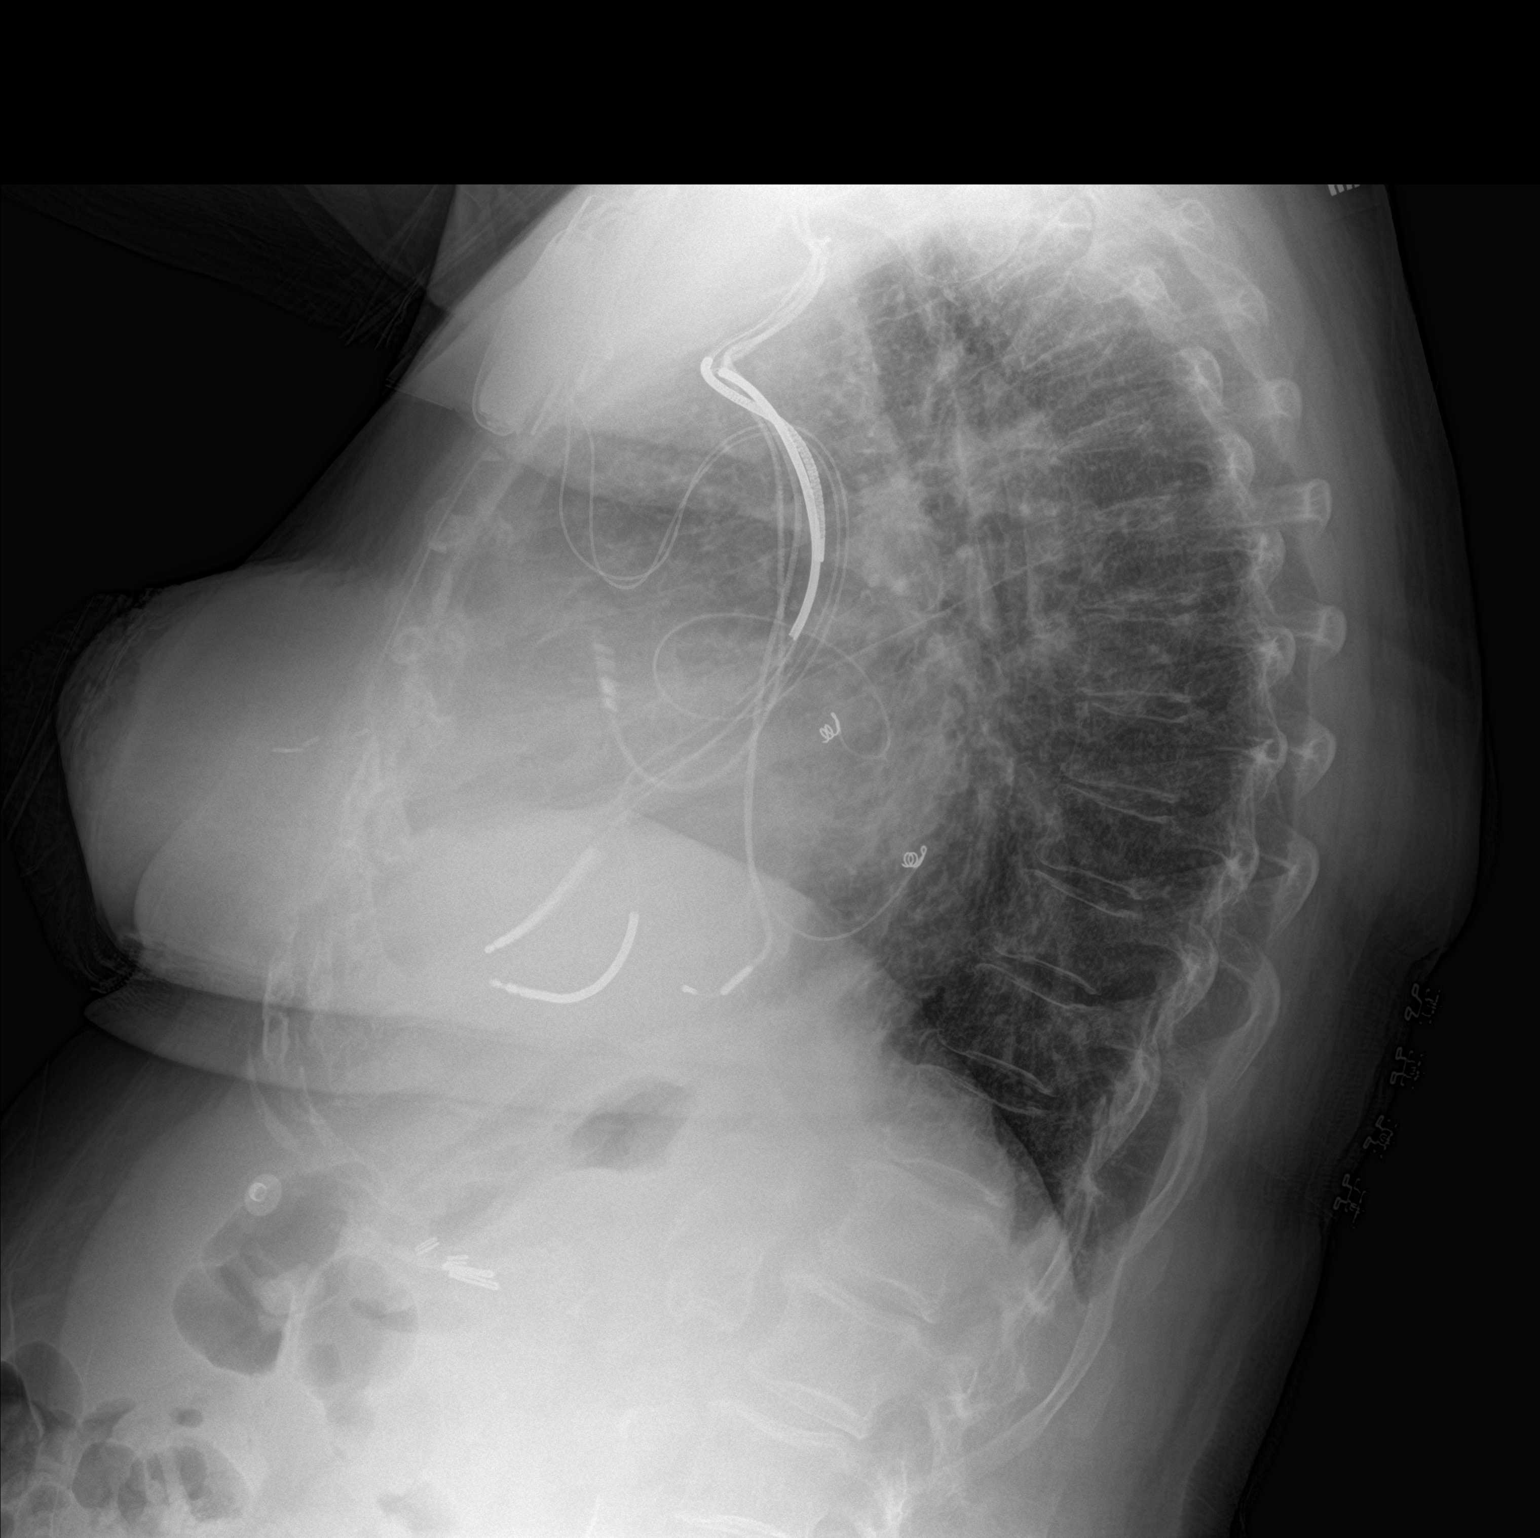

[2 of 2 positions shown; findings below may reference images not displayed]

FINDINGS: Cardiac shadow is enlarged. Defibrillator is again noted and stable.
Mild vascular congestion is seen with very minimal interstitial
edema. No focal infiltrate or sizable effusion is seen. No bony
abnormality is noted.
IMPRESSION: Mild vascular congestion.
